# Patient Record
Sex: Male | Born: 1955
Health system: Southern US, Community
[De-identification: ages and names within clinical notes are randomized; demographics above are authoritative.]

## PROBLEM LIST (undated history)

## (undated) DIAGNOSIS — I4891 Unspecified atrial fibrillation: Secondary | ICD-10-CM

## (undated) DIAGNOSIS — I10 Essential (primary) hypertension: Secondary | ICD-10-CM

## (undated) DIAGNOSIS — I25810 Atherosclerosis of coronary artery bypass graft(s) without angina pectoris: Secondary | ICD-10-CM

## (undated) DIAGNOSIS — E669 Obesity, unspecified: Secondary | ICD-10-CM

## (undated) DIAGNOSIS — F32A Depression, unspecified: Secondary | ICD-10-CM

## (undated) DIAGNOSIS — G629 Polyneuropathy, unspecified: Secondary | ICD-10-CM

## (undated) DIAGNOSIS — I2510 Atherosclerotic heart disease of native coronary artery without angina pectoris: Secondary | ICD-10-CM

## (undated) DIAGNOSIS — F419 Anxiety disorder, unspecified: Secondary | ICD-10-CM

## (undated) DIAGNOSIS — Z9289 Personal history of other medical treatment: Secondary | ICD-10-CM

## (undated) DIAGNOSIS — I499 Cardiac arrhythmia, unspecified: Secondary | ICD-10-CM

## (undated) DIAGNOSIS — E785 Hyperlipidemia, unspecified: Secondary | ICD-10-CM

## (undated) DIAGNOSIS — I671 Cerebral aneurysm, nonruptured: Secondary | ICD-10-CM

## (undated) DIAGNOSIS — I4821 Permanent atrial fibrillation: Secondary | ICD-10-CM

## (undated) DIAGNOSIS — G5600 Carpal tunnel syndrome, unspecified upper limb: Secondary | ICD-10-CM

## (undated) DIAGNOSIS — I255 Ischemic cardiomyopathy: Secondary | ICD-10-CM

## (undated) DIAGNOSIS — T8859XA Other complications of anesthesia, initial encounter: Secondary | ICD-10-CM

## (undated) DIAGNOSIS — E119 Type 2 diabetes mellitus without complications: Secondary | ICD-10-CM

## (undated) DIAGNOSIS — M1990 Unspecified osteoarthritis, unspecified site: Secondary | ICD-10-CM

## (undated) DIAGNOSIS — F329 Major depressive disorder, single episode, unspecified: Secondary | ICD-10-CM

## (undated) DIAGNOSIS — J449 Chronic obstructive pulmonary disease, unspecified: Secondary | ICD-10-CM

## (undated) DIAGNOSIS — T4145XA Adverse effect of unspecified anesthetic, initial encounter: Secondary | ICD-10-CM

## (undated) DIAGNOSIS — Z950 Presence of cardiac pacemaker: Secondary | ICD-10-CM

## (undated) HISTORY — DX: Cerebral aneurysm, nonruptured: I67.1

## (undated) HISTORY — DX: Unspecified osteoarthritis, unspecified site: M19.90

## (undated) HISTORY — PX: SHOULDER SURGERY: SHX246

## (undated) HISTORY — DX: Personal history of other medical treatment: Z92.89

## (undated) HISTORY — DX: Carpal tunnel syndrome, unspecified upper limb: G56.00

## (undated) HISTORY — DX: Unspecified atrial fibrillation: I48.91

## (undated) HISTORY — DX: Atherosclerotic heart disease of native coronary artery without angina pectoris: I25.10

## (undated) HISTORY — DX: Hyperlipidemia, unspecified: E78.5

## (undated) HISTORY — PX: TONSILLECTOMY: SUR1361

## (undated) HISTORY — DX: Anxiety disorder, unspecified: F41.9

## (undated) HISTORY — DX: Polyneuropathy, unspecified: G62.9

## (undated) HISTORY — PX: ABDOMINAL SURGERY: SHX537

## (undated) HISTORY — PX: CARDIAC CATHETERIZATION: SHX172

## (undated) HISTORY — DX: Essential (primary) hypertension: I10

## (undated) HISTORY — DX: Obesity, unspecified: E66.9

## (undated) HISTORY — PX: CORONARY ARTERY BYPASS GRAFT: SHX141

## (undated) HISTORY — DX: Cardiac arrhythmia, unspecified: I49.9

---

## 2006-04-04 ENCOUNTER — Encounter: Admission: RE | Admit: 2006-04-04 | Discharge: 2006-04-04 | Payer: Self-pay | Admitting: Cardiology

## 2006-04-12 ENCOUNTER — Encounter (HOSPITAL_COMMUNITY): Admission: RE | Admit: 2006-04-12 | Discharge: 2006-07-11 | Payer: Self-pay | Admitting: Cardiology

## 2006-04-13 ENCOUNTER — Ambulatory Visit (HOSPITAL_COMMUNITY): Admission: RE | Admit: 2006-04-13 | Discharge: 2006-04-13 | Payer: Self-pay | Admitting: Cardiology

## 2006-04-13 ENCOUNTER — Encounter (INDEPENDENT_AMBULATORY_CARE_PROVIDER_SITE_OTHER): Payer: Self-pay | Admitting: *Deleted

## 2006-04-20 ENCOUNTER — Encounter: Payer: Self-pay | Admitting: Radiology

## 2006-04-26 ENCOUNTER — Inpatient Hospital Stay (HOSPITAL_COMMUNITY): Admission: AD | Admit: 2006-04-26 | Discharge: 2006-05-05 | Payer: Self-pay | Admitting: Cardiology

## 2006-04-27 ENCOUNTER — Encounter: Payer: Self-pay | Admitting: Vascular Surgery

## 2006-04-27 ENCOUNTER — Encounter (INDEPENDENT_AMBULATORY_CARE_PROVIDER_SITE_OTHER): Payer: Self-pay | Admitting: *Deleted

## 2006-04-29 HISTORY — PX: CORONARY ARTERY BYPASS GRAFT: SHX141

## 2006-05-21 ENCOUNTER — Encounter
Admission: RE | Admit: 2006-05-21 | Discharge: 2006-05-21 | Payer: Self-pay | Admitting: Thoracic Surgery (Cardiothoracic Vascular Surgery)

## 2010-07-31 ENCOUNTER — Ambulatory Visit (HOSPITAL_COMMUNITY): Admission: RE | Admit: 2010-07-31 | Discharge: 2010-07-31 | Payer: Self-pay | Admitting: Cardiology

## 2011-01-11 ENCOUNTER — Encounter: Payer: Self-pay | Admitting: Radiology

## 2011-01-11 ENCOUNTER — Encounter: Payer: Self-pay | Admitting: Cardiology

## 2011-05-08 NOTE — Discharge Summary (Signed)
NAMEMarland Kitchen  AAYAM, TISCARENO                ACCOUNT NO.:  [id number]   MEDICAL RECORD NO.:  [id number]          PATIENT TYPE:  INP   LOCATION:  2032                         FACILITY:  [facility]   PHYSICIAN:  Valentina Gu. Roxy Manns, M.D. DATE OF BIRTH:  January 06, 1956   DATE OF ADMISSION:  04/26/2006  DATE OF DISCHARGE:  05/05/2006                                 DISCHARGE SUMMARY   PRIMARY DIAGNOSES:  Severe three-vessel coronary artery disease.   IN-HOSPITAL DIAGNOSES:  1.  Postoperative atrial fibrillation.  2.  Volume overload.  3.  Acute blood loss anemia.   SECONDARY DIAGNOSES:  1.  Hypertension.  2.  Diabetes mellitus type 2.  3.  Tobacco abuse.  4.  Anterior communicating artery aneurysm.  5.  Chronic sinusitis.   IN-HOSPITAL OPERATIONS AND PROCEDURES:  1.  Left heart catheterization with coronary angiography and left      ventriculography.  2.  Coronary bypass grafting x4 using a left internal mammary artery to the      left anterior descending coronary artery, saphenous vein graft to      diagonal branch, saphenous vein graft to circumflex marginal branch,      saphenous vein graft to posterior descending coronary artery.      Endoscopic saphenous vein harvest from left leg left side.  3.  Intraoperative transesophageal echocardiogram.   HISTORY OF PHYSICAL AND HOSPITAL COURSE:  The patient is a 55 year old obese  white male with hypertension, diabetes mellitus type 2, hyperlipidemia, and  long-standing tobacco abuse.  The patient also has problems with chronic low  back pain with previous herniated disk of the lumbosacral spine.  The  patient presents with symptoms initially that sounded spastic for exertional  angina but recently has also been associated with atypical headache.  All of  the symptoms are exertional and relieved with rest.  He underwent stress  Cardiolite exam which was abnormal.  The patient was then taken for cardiac  catheterization by Dr. Radford Pax which demonstrated  severe three-vessel  coronary artery disease with mild left ventricular dysfunction.  Following  catheterization, CVTS was consulted.  Of note, the patient was also noted to  have history of small intracranial aneurysm for which she is seen in  consultation by Dr. Arnoldo Morale.  The patient's aneurysm per Dr. Arnoldo Morale was  felt to be relatively small and likely asymptomatic with nothing to suggest  the patient's symptoms of headache related to the aneurysm.  Dr. Arnoldo Morale  recommended continued observation of the intracranial aneurysm.  He cleared  patient for coronary artery bypass grafting.  The patient was seen and  evaluated by Dr. Roxy Manns.  Dr. Roxy Manns discussed with the patient undergoing  coronary artery bypass grafting.  He discussed risks and benefits of the  procedure with the patient.  The patient acknowledged understanding and  agreed to proceed.   The patient was taken to the operating room on May10,2007 where he underwent  coronary artery bypass grafting x4 using a left internal mammary artery to  distal left anterior descending coronary artery, saphenous vein graft to  high diagonal branch, saphenous vein graft  to circumflex marginal branch,  saphenous vein graft to posterior descending coronary artery.  Endoscopic  saphenous vein harvesting was done from the left leg.  The patient tolerated  the procedure well and transferred up to the intensive care unit in stable  condition.  Immediately following surgery, the patient was seen to be  hemodynamically stable.  He was extubated late evening/early morning  following surgery.  Following extubation, the patient seemed to be alert and  oriented x3.  During the patient's postoperative course, he did develop  postoperative atrial fibrillation postoperative day #1.  Heart rate was in  the 70s to 90s.  At that time, he was started on IV amiodarone.  The patient  continued to go in and out of atrial fibrillation.  On postoperative day #5  due to  the patient's atrial fibrillation, Dr. Roxy Manns was planning to start the  patient on Coumadin.  He had discussed with Dr. Arnoldo Morale, and Dr. Arnoldo Morale  felt that this would be safe for use in a short period of time with the  patient's intracranial aneurysm.  Later that evening, the patient did  convert back to normal sinus rhythm.  The patient's Coumadin was held at  that time.  The patient remained in normal sinus rhythm the remainder of his  hospital course.  Coumadin was not initiated.  The patient also developed  acute blood loss anemia postoperatively.  The patient was asymptomatic from  the anemia, and his last hemoglobin and hematocrit on postoperative day #5  was 11.9 and 34.5.  The patient's creatinine was also monitored during his  postoperative course which remained stable.  Last creatinine obtained  postoperative day #5 was 1.1.  The patient was also noted to be volume  overload.  He was started on diuretics at that point.  The patient was near  baseline weight prior to discharge home, weighing 128 kg where his  preoperative weight was 126 kg.   The patient's chest tubes and lines were deceased in the normal fashion.  He  was transferred up to 2000 on postoperative day #2.  He was out of bed  ambulating well.  The patient's blood sugars were monitored during his  hospital course and seemed to be elevated.  The patient was continued on  sliding scale insulin, and his metformin was increased prior to discharge  home.  Blood sugars stabilized prior to discharge home.  The patient was out  of bed ambulating well.  Vital signs were monitored and seen to be stable.  He is able to be weaned off oxygen, saturating greater than 90% on room air.  The patient's incisions were clean, dry and intact and healing well.  Postoperative chest x-ray was stable.  No signs of pneumonia or pneumothorax noted.  The patient was tolerating regular diet well.  No nausea or vomiting  noted.  Bowel movements  within normal limits.   The patient was discharged to home on postoperative day #6 on [id number].  He was in normal sinus rhythm at discharge.  A follow-up appointment was  arranged with Dr. Roxy Manns for [id number] at 2:30 p.m.  The patient was to  contact Dr. Theodosia Blender office and schedule follow-up appointment with her in 2  weeks.  Mr. Juaire received instructions on diet, activity and incisional  care.  He was told no driving until released to do so.  No heavy lifting for  at least 3 months.  The patient was told he is allowed to shower, washing  his  incisions using soap and water.  He is to contact the office if he  develops any drainage or opening from any of his incision sites.  The  patient was told to ambulate 3 to 4 times per day, progress as tolerated and  to continue his breathing exercises.  He was educated on diet to be low-fat,  low-salt as well as carbohydrate modified medium calorie diet.  The patient  acknowledges understanding.  The patient was also told to follow up with Dr.  Arnoldo Morale in the next 1-2 weeks.  He is to contact this office for an  appointment.   DISCHARGE MEDICATIONS:  1.  Nicotine patch daily.  2.  Aspirin 81 mg daily.  3.  Lopressor 50 mg b.i.d.  4.  Altace 2.5 mg daily.  5.  Zocor 20 mg every night.  6.  Lasix 40 mg daily for 7 days.  7.  Potassium chloride 20 mEq daily for 7 days.  8.  Metformin 1000 mg b.i.d.  9.  Xanax as used at home.  10. Amiodarone 400 mg t.i.d. for 1 week, then 400 mg b.i.d. for 1 week, then      400 mg daily for 1 week, then 200 mg daily.  11. Oxycodone 5 mg 1 to 2 tablets q.4-6h. p.r.n. pain.      Darlin Coco, PA      Valentina Gu. Roxy Manns, M.D.  Electronically Signed    KMD/MEDQ  D:  06/30/2006  T:  06/30/2006  Job:  [id number]   cc:   Fransico Him, M.D.  Fax: [phone]

## 2011-05-08 NOTE — Cardiovascular Report (Signed)
NAMEMarland Kitchen  Ross, [name]                ACCOUNT NO.:  [id number]   MEDICAL RECORD NO.:  [id number]          PATIENT TYPE:  OIB   LOCATION:  2003                         FACILITY:  [facility]   PHYSICIAN:  Fransico Him, M.D.     DATE OF BIRTH:  1956-07-07   DATE OF PROCEDURE:  04/26/2006  DATE OF DISCHARGE:                              CARDIAC CATHETERIZATION   REFERRING PHYSICIAN:  Orpah Melter, M.D.   PROCEDURE:  Left heart catheterization, coronary angiography, left  ventriculography.   SURGEON:  Fransico Him, M.D.   INDICATIONS:  Chest pain, abnormal Cardiolite.   COMPLICATIONS:  None.   IV ACCESS:  Via right femoral artery 6-French sheath.   This is a very pleasant 55 year old obese white male with a history of  hypertension and obesity as well as tobacco abuse who presents for further  evaluation of chest pain and abnormal Cardiolite.   The patient is brought to the cardiac catheterization laboratory in a  fasting nonsedated state.  Informed consent was obtained.  The patient was  connected to continuous heart rate and pulse oximetry monitoring and  intermittent blood pressure monitoring.  The right groin was prepped and  draped in sterile fashion.  Xylocaine 1% was used for local anesthesia.  Using modified Seldinger technique, a 6-French sheath was placed in the  right femoral artery.  Under fluoroscopic guidance a 6-French JL-4 catheter  was placed in the left coronary artery,  but the catheter could not  adequately engage the left coronary ostium very well so this catheter was  exchanged out over a guidewire for 6-French JR-5 catheter which was  successfully engaged the left coronary ostium.  Multiple cine films were  taken in the 30 degree RAO and 40 degree LAO views.  This catheter was then  exchanged out over a guidewire for 6-French JR-4 catheter which was placed  under fluoroscopic guidance in the right coronary artery.  Multiple cine  films taken in 30 degree RAO and  40 degree LAO views.  This catheter was  then exchanged out over a guidewire for 6-French angled pigtail catheter  which was placed under fluoroscopic guidance in the left ventricular cavity.  Left ventriculography was performed in 30 degree RAO view using a total of  30 mL of contrast at 15 mL per second.  Catheter was then pulled back across  the aortic valve with no significant gradient noted.  At the end of the  procedure, all catheters and sheaths were removed.  Manual compression was  performed so adequate hemostasis was obtained.  The patient was transferred  back to room in stable condition.   RESULTS:  The left main coronary artery is widely patent in its proximal and  mid portion.  In the distal portion there is a 50-60% eccentric plaque, the  vessel then bifurcates into left anterior descending artery and left  circumflex artery.   The left anterior descending artery gives rise to a high first diagonal 1  which has a 20% mid narrowing and then bifurcates distally into two daughter  branches both of which are widely patent.  The first diagonal is a very  large vessel, distal to the takeoff of the diagonal is a high grade long 90-  95% stenosis of the proximal LAD.  The LAD then gives rise to a second  diagonal branch which is smaller.  The ongoing LAD is patent to the apex.   The left circumflex traverses the AV groove and gives rise to a large obtuse  marginal branch.  There is a 90% proximal stenosis in the OM-1 was then  bifurcates into two daughter branches both of which are widely patent  distally.  There is evidence of left-to-right collaterals to the distal  right coronary artery.   The right coronary artery is patent at its proximal and mid portion and  gives rise to a large RV marginal branch and then the vessel is occluded.   Left ventriculography shows moderate LV dysfunction,  ejection fraction 30%  with mid to basal inferior akinesis and moderate global  hypokinesis.  LV  pressure 175/40 mmHg, aortic pressure 176/87 mmHg.   ASSESSMENT:  1.  Severe three-vessel coronary disease.  2.  Moderate left ventricular dysfunction,  3.  Hypertension.  4.  Diabetes mellitus.  5.  A 5-6 mm anterior communicating cerebral aneurysm diagnosed recent by      MRI/MRA.   PLAN:  CVTS consult, admit to telemetry unit.  Continue Lopressor, increase  Altace to 5 mg a day.  Continue aspirin.  Check a fasting lipid panel and  hold metformin.      Fransico Him, M.D.  Electronically Signed     TT/MEDQ  D:  04/26/2006  T:  04/27/2006  Job:  [id number]
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
## 2011-05-08 NOTE — Op Note (Signed)
NAMECHARLSTON, KLUZ                ACCOUNT NO.:  [id number]   MEDICAL RECORD NO.:  [id number]          PATIENT TYPE:  INP   LOCATION:  2301                         FACILITY:  [facility]   PHYSICIAN:  Ala Dach, M.D.DATE OF BIRTH:  10-06-1956   DATE OF PROCEDURE:  04/29/2006  DATE OF DISCHARGE:                                 OPERATIVE REPORT   PROCEDURE PERFORMED:  Intraoperative transesophageal echocardiography.   INDICATIONS FOR PROCEDURE:  Mr. Ross [name] is a 55 year old gentleman who  presents today for coronary artery bypass grafting by Valentina Gu. Roxy Manns, M.D.  He has requested that we place a transesophageal echo probe for evaluation  of cardiac structures and function.   The patient was taken to the operating room for routine induction of general  anesthesia.  The trachea was intubated.  Transesophageal echo probe was  protected, lubricated and passed oropharyngeally into the stomach, then  withdrawn for imaging of the cardiac structures.   PRECARDIOPULMONARY BYPASS TEE EXAMINATION:  Left ventricle:  This is a  concentrically hypertrophied left ventricular chamber seen in a short axis  view.  The papillary muscles are well outlined.  There is good to excellent  contractility noted in the anterior and anterolateral walls.  There is  significant decreased contractility noted in the infraseptal area and the  septal area per se.  Overall, there is good to excellent left ventricular  ejection fraction with estimated ejection fraction of greater than 40%.  Long axis review again reveals a good anterior wall but slightly diminished  contractility noted in the inferior wall on the long axis view.   Mitral valve:  Thin, compliant, normal mitral valve.   Left atrium:  Normal chamber, no masses noted.  The interatrial septum was  interrogated and is intact.   Aorta:  Thin edges, three cusps noted.  It opened well during systolic  ejection, closed appropriately on Doppler  examination.  There was no  obstruction to flow.  Nor is there any diastolic insufficiency noted.   Right ventricle:  Tricuspid valve and right atrium are essentially normal.   The patient was placed on cardiopulmonary bypass.  Coronary artery bypass  grafting was carried out.  The patient was rewarmed and separated from  cardiopulmonary bypass with the initial attempt.   POSTCARDIOPULMONARY BYPASS TEE EXAMINATION (LIMITED EXAM):  Left ventricle:  In the early bypass period, short axis reviews again revealed that  infraseptal area diminished contractility, but overall the contractility of  the heart seen in the short axis view was excellent.  At time of separation  from cardiopulmonary bypass there does appear to be some improvement in the  area of the contractility and thickening in that inferior aspect of the wall  with previously diminished contractility.  Long axis review of the left  ventricular review of the left ventricular chamber again revealed somewhat  diminished noted in that inferior wall as well.   The rest of the cardiac examination was as previously described.  Essentially normal in character.  No changes were noted.   The patient was returned to the cardiac intensive care unit  in stable  condition.           ______________________________  Ala Dach, M.D.     JTM/MEDQ  D:  04/29/2006  T:  04/30/2006  Job:  [id number]

## 2011-05-08 NOTE — Op Note (Signed)
NAME:  Ross [name], Ross [name]                ACCOUNT NO.:  [id number]   MEDICAL RECORD NO.:  [id number]          PATIENT TYPE:  INP   LOCATION:  2301                         FACILITY:  [facility]   PHYSICIAN:  Valentina Gu. Roxy Manns, M.D. DATE OF BIRTH:  01-09-1956   DATE OF PROCEDURE:  04/29/2006  DATE OF DISCHARGE:                                 OPERATIVE REPORT   PREOPERATIVE DIAGNOSIS:  Severe three-vessel coronary artery disease.   POSTOPERATIVE DIAGNOSIS:  Severe three-vessel coronary artery disease.   PROCEDURE:  Median sternotomy for coronary artery bypass grafting x4 (left  internal mammary artery to distal left anterior descending coronary artery,  saphenous vein graft to high diagonal branch, saphenous vein graft to  circumflex marginal branch, saphenous vein graft to posterior descending  coronary artery, endoscopic saphenous vein harvest from left thigh).   SURGEON:  Dr. Rexene Alberts.   ASSISTANT:  Dr. Tharon Aquas Trigt.   SECOND ASSISTANT:  Ms. Doroteo Bradford   ANESTHESIA:  General.   BRIEF CLINICAL NOTE:  The patient is a 55 year old obese white male with  hypertension, type 2 diabetes mellitus, hyperlipidemia, longstanding tobacco  abuse.  The patient also has problems with chronic low back pain with  previous herniated disk of the lumbosacral spine.  The patient presents with  symptoms initially that sound classical for exertional angina, but recently  have also been associated with atypical headaches.  All the symptoms are  exertional and relieved by rest.  He underwent a stress Cardiolite exam that  is abnormal.  He underwent cardiac catheterization by Dr. Fransico Him  demonstrating severe three-vessel coronary artery disease with mild left  ventricular dysfunction.  The patient was initially seen in consultation by  Dr. Servando Snare and coronary artery bypass grafting has been recommended.  The  patient also has an known history of a small intracranial aneurysm.  For  this  reason the patient was seen in consultation by Dr. Newman Pies.  The patient's aneurysm is felt to be relatively small and likely  asymptomatic with nothing to suggest that the patient's symptoms of  headaches are related to the aneurysm.  Continued observation of the  intracranial aneurysm has been recommended.   OPERATIVE CONSENT:  The patient has been counseled at length regarding the  indications, risks, and potential benefits of coronary artery bypass  grafting.  Alternative treatment strategies have been discussed.  He  understands and accepts all associated risks of surgery including but not  limited to risk of death, stroke, myocardial infarction, congestive heart  failure, respiratory failure, pneumonia, bleeding requiring blood  transfusion, arrhythmia, infection, and recurrent coronary artery disease.  He also understands the small risk that surgery could potentially exacerbate  problems with his intracranial aneurysm, secondary rupture or stroke.  This  seems to be relatively unlikely but not impossible.  All the patient's  questions have been addressed.   OPERATIVE FINDINGS:  1.  Normal left ventricular function.  2.  Good-quality left internal mammary artery and saphenous vein conduit.  3.  Fair quality target vessels for grafting.   OPERATIVE NOTE IN DETAIL:  The  patient was brought to the operating room on  the above-mentioned date and central monitoring was established by the  anesthesia service under the care and direction of Dr. Rica Koyanagi.  Specifically, a Swan-Ganz catheter was placed through the right internal  jugular approach.  A radial arterial line was placed.  Intravenous  antibiotics were administered.  Following induction with general  endotracheal anesthesia, a Foley catheter was placed.  The patient's chest,  abdomen, both groins, and both lower extremities were prepared, draped in  sterile manner.   Median sternotomy incision was performed and  left internal mammary artery  was dissected from the chest wall and prepared for bypass grafting.  The  left internal mammary artery is good-quality conduit.  Simultaneously  saphenous vein is obtained from the patient's left thigh and the upper  portion of the left lower leg using endoscopic vein harvest technique.  Initially a small incision is made in the right leg but the saphenous vein  on that side appears small and therefore, saphenous vein is removed from the  left thigh and leg.  The saphenous vein is notably good-quality conduit.  After the saphenous vein is removed, the small surgical incisions in both  lower extremities are closed in multiple layers with running absorbable  suture.   The patient is heparinized systemically.  The pericardium was opened.  The  ascending aorta is normal in appearance.  The ascending aorta and the right  atrium were cannulated for cardiopulmonary bypass.  Cardiopulmonary bypass  is begun and the surface of the heart was inspected.  Distal sites were  selected for coronary bypass grafting.  A temperature probe was placed in  left ventricular septum.  A cardioplegic catheter is placed in the ascending  aorta.   The patient is allowed to cool to 32 degrees systemic temperature.  The  aortic crossclamp was applied and cold blood cardioplegia is administered in  antegrade fashion through the aortic root.  Iced saline slush is applied for  topical hypothermia.  The initial cardioplegic arrest and myocardial cooling  are felt to be excellent.  Repeat doses of cardioplegia administered  intermittently throughout the crossclamp portion of the operation both  through the aortic root and down the subsequently placed vein grafts to  maintain left ventricular septal temperature below 15 degrees centigrade.   The following distal coronary anastomoses were performed:  1.  The posterior descending coronary artery is grafted with a saphenous     vein graft in  end-to-side fashion.  This vessel measures 1.3 mm in      diameter and is a fair quality target.  2.  The circumflex marginal branch is grafted with a saphenous vein graft in      end-to-side fashion.  This vessel measured 1.5 mm in diameter and is a      fair to good quality target.  3.  The high diagonal branch of the left anterior descending coronary artery      is grafted with a saphenous vein graft in end-to-side fashion.  This      vessel measures 1.7 mm in diameter and is good quality at the site of      distal bypass.  4.  The distal left anterior descending coronary artery is grafted with left      internal mammary artery in end-to-side fashion.  This vessel measures      1.3 to 1.4 mm in diameter and is fair quality at the site of distal  bypass.   All three proximal saphenous vein anastomoses were performed directly to the  ascending aorta prior to removal of the aortic cross clamp.  The left  ventricular septal temperature rises dramatically following reperfusion of  the left internal mammary artery.  The aortic crossclamp was removed after a  total crossclamp time of 79 minutes.  The heart begins to beat spontaneously  without need for cardioversion.  All proximal and distal coronary  anastomoses were inspected for hemostasis and appropriate graft orientation.  Epicardial pacing wires were fixed to the right ventricular outflow tract  and to the right atrial appendage.  The patient is rewarmed to 37 degrees  centigrade temperature.  The patient is weaned from cardiopulmonary bypass  without difficulty.  The patient's rhythm at separation from bypass is  normal sinus rhythm.  No inotropic support is required.  Total  cardiopulmonary bypass time for the operation 93 minutes.   The venous and arterial cannulae are removed uneventfully.  Protamine is  administered to reverse the anticoagulation.  The mediastinum and left chest  are irrigated with saline solution containing  vancomycin.  Meticulous  surgical hemostasis ascertained.  The mediastinum and left chest are drained  using three chest tubes exited through separate stab incisions inferiorly.  The anterior to the aorta are reapproximated loosely.   The On-Q continuous pain management system is utilized for postoperative  pain control.  Two 10-inch Silastic catheters supplied with the On-Q kit are  tunneled into the deep subcutaneous tissues located just lateral to the  lateral border of the sternum on either side.  Each of these catheters were  flushed with 5 mL of 0.5% bupivacaine solution and ultimately connected to  continuous infusion pump.   The sternum was closed with double-strength sternal wire.  The soft tissues  anterior to the sternum are closed in multiple layers and the skin is closed  with a running subcuticular skin closure.  The patient tolerated the  procedure well, and is transported the surgical intensive care unit in stable condition.  There are no intraoperative complications.  All sponge,  instrument and needle counts were verified correct at completion of the  operation.  No blood products were administered.      Valentina Gu. Roxy Manns, M.D.  Electronically Signed     CHO/MEDQ  D:  04/29/2006  T:  04/30/2006  Job:  [id number]   cc:   Fransico Him, M.D.  Fax: [phone]   Orpah Melter, M.D.  Fax: [phone]   Ophelia Charter, M.D.  Fax: [phone]

## 2011-07-06 ENCOUNTER — Other Ambulatory Visit (HOSPITAL_COMMUNITY): Payer: Self-pay | Admitting: Cardiology

## 2011-07-06 DIAGNOSIS — I4891 Unspecified atrial fibrillation: Secondary | ICD-10-CM

## 2011-07-20 ENCOUNTER — Other Ambulatory Visit (HOSPITAL_COMMUNITY): Payer: Self-pay

## 2011-07-21 ENCOUNTER — Other Ambulatory Visit (HOSPITAL_COMMUNITY): Payer: Self-pay

## 2012-07-15 ENCOUNTER — Inpatient Hospital Stay (HOSPITAL_BASED_OUTPATIENT_CLINIC_OR_DEPARTMENT_OTHER): Admit: 2012-07-15 | Payer: Self-pay | Admitting: Cardiology

## 2012-07-15 ENCOUNTER — Encounter (HOSPITAL_BASED_OUTPATIENT_CLINIC_OR_DEPARTMENT_OTHER): Payer: Self-pay

## 2012-07-15 SURGERY — JV LEFT HEART CATHETERIZATION WITH CORONARY ANGIOGRAM
Anesthesia: Moderate Sedation

## 2012-08-08 ENCOUNTER — Encounter (HOSPITAL_COMMUNITY): Payer: Self-pay | Admitting: Pharmacy Technician

## 2012-08-18 ENCOUNTER — Other Ambulatory Visit: Payer: Self-pay | Admitting: Cardiology

## 2012-08-18 ENCOUNTER — Encounter: Payer: Self-pay | Admitting: Cardiology

## 2012-08-19 ENCOUNTER — Other Ambulatory Visit: Payer: Self-pay | Admitting: Cardiology

## 2012-08-19 NOTE — H&P (Signed)
Office Visit     Patient: Kewan, Wojno Provider: Paschal Dopp. Glo Herring, NP  DOB: 12-Nov-1956   Age: 56 Y   Sex: Male Date: 08/17/2012  Phone: [phone]  Address: [phone] [place], [place]-[id number]  Pcp: MARK E HEPLER    --------------------------------------------------------------------------------  Subjective:    CC:      1. PRE CATH WORK UP/TT/LABS/SEE LINDA.      HPI:     General:           Mr Beahan is a 56 yo male with a hx of CAD with CABG, HTN, and Diabetes and PAF. 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 He was placed on Pradaxa.. He had recent nuclear stress test with + ischemia and Dr Radford Pax plans to proceed with cardiac catheterinzation. He states he did not want to tell anyone but he has been having episodes of chest pain for years.          He denies feeling any irregular heart beats, dizziness, syncope nor swelling. He has SOB with exerting which is stable. He is very anxious and worried that cath could lead to repeat CABG and he states he will not have that done. .      ROS:      as noted in HPI, he denies any allergy to seafood nor IVP dye. no GI complaints, no black or bloody BMS, no bruising, nor bleeding, no fever, chills, nor congestion, no neurological changes,.     Medical History: Type 2 diabetes, Coronary artery disease - s/p CABG, Hypertension, Carpal tunnel syndrome, Cerebral aneurysm, Obesity, Hyperlipidemia - target LDL < 70, Anxiety, Erectile dysfunction, Peripheral neuropathy, Ongoing tobacco use, Beta blocker induced bradycardia, atrial fibrillation on Pradaxa.      Family History:       Social History:      General: History of smoking  cigarettes: Current smoker, Frequency: 1 PPD.  Smoking: yes.      Medications: Multivitamins  Capsule 1 capsule Occ., Aspirin 81 MG Tablet Chewable 1 tablet Once a day, Pradaxa 150 MG 150 MG Tablet 1 capsule Twice a day, Lisinopril 20 MG Tablet TAKE ONE TABLET BY MOUTH EVERY DAY , Simvastatin 20 MG Tablet TAKE ONE TABLET BY MOUTH IN THE EVENING , Toprol XL 25 MG Tablet Extended Release 24 Hour 1/2 tablet Once a day, Viagra 50 MG Tablet 1 tablet as needed Once a day, Amlodipine Besylate 10 MG Tablet 1/2 tablet twice a day, Xanax 1 mg Tablet 1/2 tablet twice daily as needed, Medication List reviewed and reconciled with the patient     Allergies: N.K.D.A.     Objective:    Vitals: Wt 279.8, Wt change 3.8 lb, Ht 73, BMI 36.91, Pulse sitting 64, BP sitting 130/78.     Examination:     Cardiology, General:         GENERAL APPEARANCE: anxious, NAD, clothing smells of smoke.  HEENT: unremarkable.  CAROTID UPSTROKE: normal, no bruit.  JVD: flat.  HEART SOUNDS: regular, normal S1, S2, no S3 or S4.  MURMUR: absent.  LUNGS: no rales or wheezes.  ABDOMEN: soft, non tender, positive bowel sounds, .  EXTREMITIES: no leg edema.  PERIPHERAL PULSES: 2 plus bilateral.  Assessment:    Assessment:  1. Coronary atherosclerosis of native coronary artery - 414.01 (Primary)   2. Atrial fibrillation - 427.31   3. Essential hypertension, benign - 401.1   4. Chest pain - 786.50   5. Abnormal nuclear stress test - 794.39     Plan:    1. Coronary atherosclerosis of native coronary artery  Continue Aspirin Tablet Chewable, 81 MG, 1 tablet, Orally, Once a day ;  Continue Pradaxa 150 MG Tablet, 150 MG, 1 capsule, Orally, Twice a day hold Pradaxa for 2 full days prior to cath .       2. Essential hypertension, benign  Continue Lisinopril Tablet, 20 MG, TAKE ONE TABLET BY MOUTH EVERY DAY ;  Continue Toprol XL Tablet Extended Release 24 Hour, 25 MG, 1/2 tablet, Orally, Once a day ;  Continue Amlodipine Besylate Tablet, 10 MG, 1/2 tablet, Orally, twice a day .       3. Abnormal nuclear stress test         LAB: Basic Metabolic     GLUCOSE 80 123456 - mg/dL        BUN 14 6-26 - mg/dL        CREATININE 1.07 0.60-1.30 - mg/dl        eGFR (NON-AFRICAN AMERICAN) 71 >60 - calc        eGFR (AFRICAN AMERICAN) 86 >60 - calc        SODIUM 141 136-145 - mmol/L        POTASSIUM 4.7 3.5-5.5 - mmol/L        CHLORIDE 106 98-107 - mmol/L        C02 29 22-32 - mg/dL        ANION GAP 10.9 6.0-20.0 - mmol/L         CALCIUM 10.6 8.6-10.3 - mg/dL H              FERGUSON,CYNTHIA A 08/17/2012 04:13:10 PM > stable for cath        LAB: CBC with Diff     WBC 9.5 4.0-11.0 - K/ul        RBC 5.08 4.20-5.80 - M/uL        HGB 15.8 13.0-17.0 - g/dL        HCT 47.9 39.0-52.0 - %        MCH 31.1 27.0-33.0 - pg        MPV 10.4 7.5-10.7 - fL         MCV 94.4 80.0-94.0 - fL H       MCHC 32.9 32.0-36.0 - g/dL        RDW 12.8 11.5-15.5 - %        NRBC# 0.01 -        PLT 243 150-400 - K/uL        NEUT % 46.4 43.3-71.9 - %        NRBC% 0.10 - %        LYMPH% 39.3 16.8-43.5 - %        MONO % 8.4 4.6-12.4 - %        EOS % 5.3 0.0-7.8 - %        BASO % 0.6 0.0-1.0 - %        NEUT # 4.4 1.9-7.2 - K/uL         LYMPH# 3.70 1.10-2.70 - K/uL H       MONO # 0.8 0.3-0.8 - K/uL        EOS # 0.5 0.0-0.6 -  K/uL        BASO # 0.1 0.0-0.1 - K/uL               FERGUSON,CYNTHIA A 08/17/2012 04:13:32 PM > ok for cath        LAB: PT and PTT [id number])     aPTT 32 24-33 - SEC        INR 1.1 0.8-1.2 -        Prothrombin Time 11.2 9.1-12.0 - SEC               FERGUSON,CYNTHIA A 08/18/2012 10:04:30 AM > ok for cath   Risks and benefits of cardiac catheterization have been reviewed including risk of stroke, heart attack, death, bleeding, renal impariment and arterial damage. There was ample oppurtuny to answer questions. Alternatives were discussed. Patient understands and wishes to proceed.          Immunizations:       Labs:      Procedure Codes: [id number] ECL BMP, [id number] ECL CBC PLATELET DIFF, [id number] BLOOD COLLECTION ROUTINE VENIPUNCTURE      Preventive:           Follow Up: TT pending cath (Reason: CAD, cardiac cath f/u)        Provider: Paschal Dopp. Glo Herring, NP  Patient: Kycen, Rabara  DOB: [birth year]  Date: 08/17/2012

## 2012-08-23 ENCOUNTER — Encounter (HOSPITAL_COMMUNITY)
Admission: RE | Disposition: A | Discharge status: Home / Self Care | Payer: Self-pay | Source: Ambulatory Visit | Attending: Cardiology

## 2012-08-23 ENCOUNTER — Ambulatory Visit (HOSPITAL_COMMUNITY)
Admission: RE | Admit: 2012-08-23 | Discharge: 2012-08-23 | Disposition: A | Discharge status: Home / Self Care | Payer: Medicaid Other | Source: Ambulatory Visit | Attending: Cardiology | Admitting: Cardiology

## 2012-08-23 DIAGNOSIS — I2510 Atherosclerotic heart disease of native coronary artery without angina pectoris: Secondary | ICD-10-CM | POA: Insufficient documentation

## 2012-08-23 DIAGNOSIS — E119 Type 2 diabetes mellitus without complications: Secondary | ICD-10-CM | POA: Insufficient documentation

## 2012-08-23 DIAGNOSIS — R079 Chest pain, unspecified: Secondary | ICD-10-CM | POA: Insufficient documentation

## 2012-08-23 DIAGNOSIS — I10 Essential (primary) hypertension: Secondary | ICD-10-CM | POA: Insufficient documentation

## 2012-08-23 DIAGNOSIS — E669 Obesity, unspecified: Secondary | ICD-10-CM | POA: Insufficient documentation

## 2012-08-23 DIAGNOSIS — F17200 Nicotine dependence, unspecified, uncomplicated: Secondary | ICD-10-CM | POA: Insufficient documentation

## 2012-08-23 DIAGNOSIS — I25810 Atherosclerosis of coronary artery bypass graft(s) without angina pectoris: Secondary | ICD-10-CM | POA: Insufficient documentation

## 2012-08-23 DIAGNOSIS — R0602 Shortness of breath: Secondary | ICD-10-CM | POA: Insufficient documentation

## 2012-08-23 HISTORY — PX: LEFT HEART CATHETERIZATION WITH CORONARY ANGIOGRAM: SHX5451

## 2012-08-23 LAB — GLUCOSE, CAPILLARY: Glucose-Capillary: 105 mg/dL — ABNORMAL HIGH (ref 70–99)

## 2012-08-23 SURGERY — LEFT HEART CATHETERIZATION WITH CORONARY ANGIOGRAM
Anesthesia: LOCAL

## 2012-08-23 MED ORDER — ASPIRIN 81 MG PO CHEW
324.0000 mg | CHEWABLE_TABLET | ORAL | Status: AC
  Administered 2012-08-23: 324 mg via ORAL

## 2012-08-23 MED ORDER — ONDANSETRON HCL 4 MG/2ML IJ SOLN: 4.0000 mg | Freq: Four times a day (QID) | INTRAMUSCULAR | Status: DC | PRN

## 2012-08-23 MED ORDER — HEPARIN (PORCINE) IN NACL 2-0.9 UNIT/ML-% IJ SOLN
INTRAMUSCULAR | Status: AC
  Filled 2012-08-23: qty 2000

## 2012-08-23 MED ORDER — NITROGLYCERIN 0.2 MG/ML ON CALL CATH LAB
INTRAVENOUS | Status: AC
  Filled 2012-08-23: qty 1

## 2012-08-23 MED ORDER — SODIUM CHLORIDE 0.9 % IJ SOLN: 3.0000 mL | Freq: Two times a day (BID) | INTRAMUSCULAR | Status: DC

## 2012-08-23 MED ORDER — DIAZEPAM 5 MG PO TABS
ORAL_TABLET | ORAL | Status: AC
  Administered 2012-08-23: 5 mg via ORAL
  Filled 2012-08-23: qty 1

## 2012-08-23 MED ORDER — ASPIRIN 81 MG PO CHEW
CHEWABLE_TABLET | ORAL | Status: AC
  Administered 2012-08-23: 324 mg via ORAL
  Filled 2012-08-23: qty 4

## 2012-08-23 MED ORDER — DIAZEPAM 5 MG PO TABS
5.0000 mg | ORAL_TABLET | ORAL | Status: AC
  Administered 2012-08-23: 5 mg via ORAL

## 2012-08-23 MED ORDER — FENTANYL CITRATE 0.05 MG/ML IJ SOLN
INTRAMUSCULAR | Status: AC
  Filled 2012-08-23: qty 2

## 2012-08-23 MED ORDER — MIDAZOLAM HCL 2 MG/2ML IJ SOLN
INTRAMUSCULAR | Status: AC
  Filled 2012-08-23: qty 2

## 2012-08-23 MED ORDER — SODIUM CHLORIDE 0.9 % IV SOLN
INTRAVENOUS | Status: DC
  Administered 2012-08-23: 1000 mL via INTRAVENOUS

## 2012-08-23 MED ORDER — ISOSORBIDE MONONITRATE ER 30 MG PO TB24: 30.0000 mg | ORAL_TABLET | Freq: Every day | ORAL | Status: DC

## 2012-08-23 MED ORDER — SODIUM CHLORIDE 0.9 % IV SOLN: 1.0000 mL/kg/h | INTRAVENOUS | Status: DC

## 2012-08-23 MED ORDER — LIDOCAINE HCL (PF) 1 % IJ SOLN
INTRAMUSCULAR | Status: AC
  Filled 2012-08-23: qty 30

## 2012-08-23 MED ORDER — SODIUM CHLORIDE 0.9 % IV SOLN: 250.0000 mL | INTRAVENOUS | Status: DC | PRN

## 2012-08-23 MED ORDER — ACETAMINOPHEN 325 MG PO TABS: 650.0000 mg | ORAL_TABLET | ORAL | Status: DC | PRN

## 2012-08-23 MED ORDER — SODIUM CHLORIDE 0.9 % IJ SOLN: 3.0000 mL | INTRAMUSCULAR | Status: DC | PRN

## 2012-08-23 NOTE — Interval H&P Note (Signed)
History and Physical Interval Note:  08/23/2012 9:25 AM  Ross [name]  has presented today for surgery, with the diagnosis of Chest pain  The various methods of treatment have been discussed with the patient and family. After consideration of risks, benefits and other options for treatment, the patient has consented to  Procedure(s) (LRB): LEFT HEART CATHETERIZATION WITH CORONARY ANGIOGRAM (N/A) as a surgical intervention .  The patient's history has been reviewed, patient examined, no change in status, stable for surgery.  I have reviewed the patient's chart and labs.  Questions were answered to the patient's satisfaction.     TURNER,TRACI R

## 2012-08-23 NOTE — CV Procedure (Signed)
PROCEDURE:  Left heart catheterization with selective coronary angiography, left ventriculogram.  INDICATIONS:    The risks, benefits, and details of the procedure were explained to the patient.  The patient verbalized understanding and wanted to proceed.  Informed written consent was obtained.  PROCEDURE TECHNIQUE:  After Xylocaine anesthesia a 106F sheath was placed in the right femoral artery with a single anterior needle wall stick.   Left coronary angiography was done using a Judkins L4 guide catheter.  Right coronary angiography was done using a Judkins R4 guide catheter.  Left ventriculography was done using a pigtail catheter.    CONTRAST:  Total of 150cc.  COMPLICATIONS:  None.    HEMODYNAMICS:  Aortic pressure was 124/61mmHg; LV pressure was 120/45mmHg; LVEDP 86mmHg.  There was no gradient between the left ventricle and aorta.    ANGIOGRAPHIC DATA:   The left main coronary artery is widely patent in its proximal portion and distally has a 60% eccentric plaque and then bifurcates intto an LAD and left circumflex..  The left anterior descending artery is widely patent in its proximal portion.  It gives rise to a first diagonal.  The ongoing LAD has a high grade 90% stenosis in the mid LAD with evidence of competitive flow in the mid LAD into the LIMA graft.    The left circumflex artery has a 40% stenosis the proximal portion.  The OM1 appears occluded proximally.  The right coronary artery is patent proximally and at the takeoff of a large RV marginal branch there is a long 80-90% stenosis.  The ongoing RCA is patent and distally has a 60-70% stenosis before bifurcating into a PDA and PL branches.  The PDA is diffusely diseased and small  The SVG to Diagonal is widely patent.  The SVG to OM1 is widely patent.  The LIMA to LAD is widely patent.  The SVG to PDA is occluded at the ostium.  LEFT VENTRICULOGRAM:  Left ventricular angiogram was done in the 30 RAO projection and  revealed normal left ventricular wall motion and systolic function with an estimated ejection fraction of 55  LVEDP was 40mHg.  IMPRESSIONS:  1. Severe 3 vessel ASCAD 2.  Patent SVG to OM1, SVG to diagonal and LIMA to LAD 3.  Occluded SVG to PDA with high grade stenosis of mid RCA 4.  Normal left ventricular systolic function.  LVEDP 67mHg.  Ejection fraction 55  RECOMMENDATION:   1.  D/C home after IVF and bedrest complete. 2.  Films reviewed with Dr. Tamala Julian.  Patient has very atypical CP only at rest and is better with exertion.  His lesion in the RCA is long and would require several stents.  I have recommended medical management at this time with nitrates.  I have counseled the patient in smoking cessation. 3.  Followup with Bennye Alm NP in 2 weeks

## 2012-08-23 NOTE — H&P (View-Only) (Signed)
Office Visit     Patient: Ross [name], Ross [name] Provider: Paschal Dopp. Glo Herring, NP  DOB: 1956/07/09   Age: 56 Y   Sex: Male Date: 08/17/2012  Phone: [phone]  Address: [phone], [place], [place]  Pcp: MARK E HEPLER    --------------------------------------------------------------------------------  Subjective:    CC:      1. PRE CATH WORK UP/TT/LABS/SEE LINDA.      HPI:     General:           Ross [name] is a 55 yo male with a hx of CAD with CABG, HTN, and Diabetes and PAF. 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 He was placed on Pradaxa.. He had recent nuclear stress test with + ischemia and Dr Radford Pax plans to proceed with cardiac catheterinzation. He states he did not want to tell anyone but he has been having episodes of chest pain for years.          He denies feeling any irregular heart beats, dizziness, syncope nor swelling. He has SOB with exerting which is stable. He is very anxious and worried that cath could lead to repeat CABG and he states he will not have that done. .      ROS:      as noted in HPI, he denies any allergy to seafood nor IVP dye. no GI complaints, no black or bloody BMS, no bruising, nor bleeding, no fever, chills, nor congestion, no neurological changes,.     Medical History: Type 2 diabetes, Coronary artery disease - s/p CABG, Hypertension, Carpal tunnel syndrome, Cerebral aneurysm, Obesity, Hyperlipidemia - target LDL < 70, Anxiety, Erectile dysfunction, Peripheral neuropathy, Ongoing tobacco use, Beta blocker induced bradycardia, atrial fibrillation on Pradaxa.      Family History:       Social History:      General: History of smoking  cigarettes: Current smoker, Frequency: 1 PPD.  Smoking: yes.      Medications: Multivitamins  Capsule 1 capsule Occ., Aspirin 81 MG Tablet Chewable 1 tablet Once a day, Pradaxa 150 MG 150 MG Tablet 1 capsule Twice a day, Lisinopril 20 MG Tablet TAKE ONE TABLET BY MOUTH EVERY DAY , Simvastatin 20 MG Tablet TAKE ONE TABLET BY MOUTH IN THE EVENING , Toprol XL 25 MG Tablet Extended Release 24 Hour 1/2 tablet Once a day, Viagra 50 MG Tablet 1 tablet as needed Once a day, Amlodipine Besylate 10 MG Tablet 1/2 tablet twice a day, Xanax 1 mg Tablet 1/2 tablet twice daily as needed, Medication List reviewed and reconciled with the patient     Allergies: N.K.D.A.     Objective:    Vitals: Wt 279.8, Wt change 3.8 lb, Ht 73, BMI 36.91, Pulse sitting 64, BP sitting 130/78.     Examination:     Cardiology, General:         GENERAL APPEARANCE: anxious, NAD, clothing smells of smoke.  HEENT: unremarkable.  CAROTID UPSTROKE: normal, no bruit.  JVD: flat.  HEART SOUNDS: regular, normal S1, S2, no S3 or S4.  MURMUR: absent.  LUNGS: no rales or wheezes.  ABDOMEN: soft, non tender, positive bowel sounds, .  EXTREMITIES: no leg edema.  PERIPHERAL PULSES: 2 plus bilateral.  Assessment:    Assessment:  1. Coronary atherosclerosis of native coronary artery - 414.01 (Primary)   2. Atrial fibrillation - 427.31   3. Essential hypertension, benign - 401.1   4. Chest pain - 786.50   5. Abnormal nuclear stress test - 794.39     Plan:    1. Coronary atherosclerosis of native coronary artery  Continue Aspirin Tablet Chewable, 81 MG, 1 tablet, Orally, Once a day ;  Continue Pradaxa 150 MG Tablet, 150 MG, 1 capsule, Orally, Twice a day hold Pradaxa for 2 full days prior to cath .       2. Essential hypertension, benign  Continue Lisinopril Tablet, 20 MG, TAKE ONE TABLET BY MOUTH EVERY DAY ;  Continue Toprol XL Tablet Extended Release 24 Hour, 25 MG, 1/2 tablet, Orally, Once a day ;  Continue Amlodipine Besylate Tablet, 10 MG, 1/2 tablet, Orally, twice a day .       3. Abnormal nuclear stress test         LAB: Basic Metabolic     GLUCOSE 80 123456 - mg/dL        BUN 14 6-26 - mg/dL        CREATININE 1.07 0.60-1.30 - mg/dl        eGFR (NON-AFRICAN AMERICAN) 71 >60 - calc        eGFR (AFRICAN AMERICAN) 86 >60 - calc        SODIUM 141 136-145 - mmol/L        POTASSIUM 4.7 3.5-5.5 - mmol/L        CHLORIDE 106 98-107 - mmol/L        C02 29 22-32 - mg/dL        ANION GAP 10.9 6.0-20.0 - mmol/L         CALCIUM 10.6 8.6-10.3 - mg/dL H              FERGUSON,CYNTHIA A 08/17/2012 04:13:10 PM > stable for cath        LAB: CBC with Diff     WBC 9.5 4.0-11.0 - K/ul        RBC 5.08 4.20-5.80 - M/uL        HGB 15.8 13.0-17.0 - g/dL        HCT 47.9 39.0-52.0 - %        MCH 31.1 27.0-33.0 - pg        MPV 10.4 7.5-10.7 - fL         MCV 94.4 80.0-94.0 - fL H       MCHC 32.9 32.0-36.0 - g/dL        RDW 12.8 11.5-15.5 - %        NRBC# 0.01 -        PLT 243 150-400 - K/uL        NEUT % 46.4 43.3-71.9 - %        NRBC% 0.10 - %        LYMPH% 39.3 16.8-43.5 - %        MONO % 8.4 4.6-12.4 - %        EOS % 5.3 0.0-7.8 - %        BASO % 0.6 0.0-1.0 - %        NEUT # 4.4 1.9-7.2 - K/uL         LYMPH# 3.70 1.10-2.70 - K/uL H       MONO # 0.8 0.3-0.8 - K/uL        EOS # 0.5 0.0-0.6 -  K/uL        BASO # 0.1 0.0-0.1 - K/uL               FERGUSON,CYNTHIA A 08/17/2012 04:13:32 PM > ok for cath        LAB: PT and PTT [id number])     aPTT 32 24-33 - SEC        INR 1.1 0.8-1.2 -        Prothrombin Time 11.2 9.1-12.0 - SEC               FERGUSON,CYNTHIA A 08/18/2012 10:04:30 AM > ok for cath   Risks and benefits of cardiac catheterization have been reviewed including risk of stroke, heart attack, death, bleeding, renal impariment and arterial damage. There was ample oppurtuny to answer questions. Alternatives were discussed. Patient understands and wishes to proceed.          Immunizations:       Labs:      Procedure Codes: [id number] ECL BMP, [id number] ECL CBC PLATELET DIFF, [id number] BLOOD COLLECTION ROUTINE VENIPUNCTURE      Preventive:           Follow Up: TT pending cath (Reason: CAD, cardiac cath f/u)        Provider: Paschal Dopp. Glo Herring, NP  Patient: Ross [name], Ross [name]  DOB: 1956-08-10  Date: 08/17/2012

## 2013-09-16 ENCOUNTER — Other Ambulatory Visit: Payer: Self-pay | Admitting: Cardiology

## 2013-09-16 DIAGNOSIS — I4891 Unspecified atrial fibrillation: Secondary | ICD-10-CM

## 2013-09-16 DIAGNOSIS — Z79899 Other long term (current) drug therapy: Secondary | ICD-10-CM

## 2013-10-04 ENCOUNTER — Telehealth: Payer: Self-pay | Admitting: General Surgery

## 2013-10-04 NOTE — Telephone Encounter (Signed)
Pt walked in and handed a form for [place] to sign for him to receive his pradaxa medication. I put on Dr. Landis Gandy desk for her to fill out.

## 2013-10-04 NOTE — Telephone Encounter (Signed)
Pt is aware paper has been faxed for him. Last rx sent in June 2014.

## 2013-10-23 ENCOUNTER — Other Ambulatory Visit: Payer: Medicaid Other

## 2013-10-24 ENCOUNTER — Other Ambulatory Visit (INDEPENDENT_AMBULATORY_CARE_PROVIDER_SITE_OTHER): Payer: BC Managed Care – PPO

## 2013-10-24 DIAGNOSIS — I4891 Unspecified atrial fibrillation: Secondary | ICD-10-CM

## 2013-10-24 DIAGNOSIS — Z79899 Other long term (current) drug therapy: Secondary | ICD-10-CM

## 2013-10-24 LAB — CBC
MCHC: 34.4 g/dL (ref 30.0–36.0)
Platelets: 247 10*3/uL (ref 150.0–400.0)
RDW: 13.1 % (ref 11.5–14.6)

## 2013-10-29 NOTE — Progress Notes (Signed)
Creatinine normal.

## 2013-11-14 ENCOUNTER — Telehealth: Payer: Self-pay | Admitting: Cardiology

## 2013-11-14 NOTE — Telephone Encounter (Signed)
New Problem:  Pt is asking to hear the results of his blood work from a month ago.

## 2013-11-14 NOTE — Telephone Encounter (Signed)
Made pt aware. We had called Nov 4th and gave results to his wife who is on he DPR from [place].

## 2013-12-01 ENCOUNTER — Encounter: Payer: Self-pay | Admitting: Physician Assistant

## 2013-12-01 ENCOUNTER — Ambulatory Visit (INDEPENDENT_AMBULATORY_CARE_PROVIDER_SITE_OTHER): Payer: BC Managed Care – PPO | Admitting: Physician Assistant

## 2013-12-01 VITALS — BP 119/68 | HR 67 | Ht 73.0 in | Wt 279.0 lb

## 2013-12-01 DIAGNOSIS — I4891 Unspecified atrial fibrillation: Secondary | ICD-10-CM

## 2013-12-01 DIAGNOSIS — E785 Hyperlipidemia, unspecified: Secondary | ICD-10-CM

## 2013-12-01 DIAGNOSIS — I2510 Atherosclerotic heart disease of native coronary artery without angina pectoris: Secondary | ICD-10-CM

## 2013-12-01 DIAGNOSIS — I10 Essential (primary) hypertension: Secondary | ICD-10-CM

## 2013-12-01 DIAGNOSIS — I25709 Atherosclerosis of coronary artery bypass graft(s), unspecified, with unspecified angina pectoris: Secondary | ICD-10-CM | POA: Insufficient documentation

## 2013-12-01 DIAGNOSIS — R079 Chest pain, unspecified: Secondary | ICD-10-CM

## 2013-12-01 DIAGNOSIS — G8929 Other chronic pain: Secondary | ICD-10-CM

## 2013-12-01 NOTE — Progress Notes (Signed)
[place], [place], [place]  [address] Phone: [phone] Fax:  [phone]  Date:  12/01/2013   ID:  ALEGANDRO VANKAMPEN, DOB [birth year], MRN [id number]  PCP:  Orpah Melter, MD  Cardiologist:  Dr. Fransico Him     History of Present Illness: Ross [name] is a 57 y.o. male with a hx of CAD, status post CABG, T2DM, permanent atrial fibrillation, HTN, HL, prior cerebral aneurysm. He developed atrial fibrillation during an exercise treadmill test in 2012. He was placed on Pradaxa at that time. Myoview (06/2012): Apical anterior mild ischemia, inferior attenuation. LHC (08/2012): dLM 60%, mid LAD 90%, pCFX 40%, OM1 occluded, RCA 80-90%, distal RCA 60-70%, SVG-Dx patent, SVG-OM1 patent, LIMA-LAD patent, SVG-PDA occluded, EF 55%. The disease in his RCA was long and was felt to require several stents. Medical therapy was recommended at that time.  Patient did not want redo CABG. Echocardiogram (05/2013): Mild LVH, EF 54.8%, moderate LAE, mild aortic root dilatation, trace pulmonic regurgitation. Last seen by Dr. Radford Pax 06/15/13. Notes indicate the patient had a Holter monitor recently that demonstrated atrial fibrillation with slow ventricular response. He did have bradycardia with heart rates in the 20s during sleep. Rate control strategy was recommended for his atrial fibrillation.  Since last seen, he is doing ok.  He has chronic chest pain.  This is greatly improved since increasing Isosorbide to 30 mg BID.  He has chronic DOE.  He is NYHA Class 2-2b.  He denies syncope.  No orthopnea, PND, edema.  He has significant DJD in his knees.  He may need TKR in the next couple of years.    Recent Labs: 10/24/2013: Creatinine 1.3; Hemoglobin 16.1   Wt Readings from Last 3 Encounters:  12/01/13 279 lb (126.554 kg)  08/23/12 280 lb (127.007 kg)  08/23/12 280 lb (127.007 kg)     Past Medical History  Diagnosis Date  . Diabetes mellitus   . Hypertension   . Carpal tunnel syndrome   .  Cerebral aneurysm   . Obesity   . Dyslipidemia   . Anxiety   . Peripheral neuropathy   . Coronary artery disease     a. s/p CABG;  b. Myoview (7/13):  apical anterior ischemia; c. LHC (08/2012): dLM 60%, mid LAD 90%, pCFX 40%, OM1 occluded, RCA 80-90%, distal RCA 60-70%, SVG-Dx patent, SVG-OM1 patent, LIMA-LAD patent, SVG-PDA occluded, EF 55%. => RCA dsz too long and would req multiple stents; pt refused CABG => med Rx.  Marland Kitchen Hx of echocardiogram     a. Echocardiogram (05/2013): Mild LVH, EF 54.8%, moderate LAE, mild aortic root dilatation, trace pulmonic regurgitation  . DJD (degenerative joint disease)   . Atrial fibrillation     rate control strategy; LA large    Current Outpatient Prescriptions  Medication Sig Dispense Refill  . ALPRAZolam (XANAX) 1 MG tablet Take 0.5-1 mg by mouth 2 (two) times daily as needed. For anxiety      . amLODipine (NORVASC) 10 MG tablet Take 5 mg by mouth every 12 (twelve) hours. Patient has to take 12 hours apart.      . dabigatran (PRADAXA) 150 MG CAPS Take 150 mg by mouth every 12 (twelve) hours.      . isosorbide mononitrate (IMDUR) 30 MG 24 hr tablet Take 1 tablet (30 mg total) by mouth daily.  30 tablet  11  . lisinopril (PRINIVIL,ZESTRIL) 20 MG tablet Take 20 mg by mouth daily.      . simvastatin (  ZOCOR) 20 MG tablet Take 20 mg by mouth every evening.       No current facility-administered medications for this visit.    Allergies:   Review of patient's allergies indicates no known allergies.   Social History:  The patient  reports that he has quit smoking. He does not have any smokeless tobacco history on file.   Family History:  The patient's family history is not on file.   ROS:  Please see the history of present illness.      All other systems reviewed and negative.   PHYSICAL EXAM: VS:  BP 119/68  Pulse 67  Ht 6\' 1"  (1.854 m)  Wt 279 lb (126.554 kg)  BMI 36.82 kg/m2 Well nourished, well developed, in no acute distress HEENT: normal Neck:  no JVD Cardiac:  normal S1, S2; irregularly irregular rhythm; no murmur Lungs:  clear to auscultation bilaterally, no wheezing, rhonchi or rales Abd: soft, nontender, no hepatomegaly Ext: no edema Skin: warm and dry Neuro:  CNs 2-12 intact, no focal abnormalities noted  EKG:  AFib, HR 67, no ST changes      ASSESSMENT AND PLAN:  1. CAD:  Stable chronic chest pain.  Continue current Rx including nitrates, amlodipine, statin.  HR too slow to take beta blocker.  He is not on ASA as he is on Pradaxa. 2. Atrial Fibrillation:  Rate controlled.  Continue Pradaxa. 3. Hypertension:  Controlled. 4. Hyperlipidemia:  Continue statin. 5. Disposition:  F/u with Dr. Fransico Him in 6 mos.   Signed, Richardson Dopp, PA-C  12/01/2013 11:32 AM

## 2013-12-01 NOTE — Patient Instructions (Signed)
NO CHANGES WERE MADE TODAY  Your physician wants you to follow-up in: [place]. You will receive a reminder letter in the mail two months in advance. If you don't receive a letter, please call our office to schedule the follow-up appointment.

## 2014-01-04 ENCOUNTER — Ambulatory Visit: Payer: Medicaid Other | Admitting: Cardiology

## 2014-01-12 ENCOUNTER — Other Ambulatory Visit: Payer: Self-pay

## 2014-01-12 MED ORDER — ISOSORBIDE MONONITRATE ER 30 MG PO TB24: 30.0000 mg | ORAL_TABLET | Freq: Two times a day (BID) | ORAL | Status: DC

## 2014-06-11 ENCOUNTER — Other Ambulatory Visit: Payer: Self-pay | Admitting: General Surgery

## 2014-06-11 ENCOUNTER — Telehealth: Payer: Self-pay | Admitting: *Deleted

## 2014-06-11 DIAGNOSIS — I482 Chronic atrial fibrillation, unspecified: Secondary | ICD-10-CM

## 2014-06-11 MED ORDER — DABIGATRAN ETEXILATE MESYLATE 150 MG PO CAPS: 150.0000 mg | ORAL_CAPSULE | Freq: Two times a day (BID) | ORAL | Status: DC

## 2014-06-11 NOTE — Telephone Encounter (Signed)
I tried Risk manager. I could not on the computer in nurses station. I will print out in the am and will have Dr Radford Pax sign off on it

## 2014-06-11 NOTE — Telephone Encounter (Signed)
Rx printed and on Dr Morene Rankins to sign.

## 2014-06-11 NOTE — Telephone Encounter (Signed)
Patient is out of pradaxa and needs an rx printed and faxed to [phone]. He needs an appointment but doesn't know why, I made him aware that per Richardson Dopp he was to return for follow up with Dr Radford Pax in six months. He wants the rx to be for a year, but I told him that I was unsure that it would be for a year at this time as he is due to follow up. He doesn't know what info needs to be on the cover sheet for this. He also requests a call back when this is handled and also so he can find out how long the rx will be for. I transferred him to scheduling to set up an appointment. Thanks, MI

## 2014-06-12 NOTE — Telephone Encounter (Signed)
Pt aware Pradaxa has been faxed for him

## 2014-06-27 ENCOUNTER — Telehealth: Payer: Self-pay | Admitting: *Deleted

## 2014-06-27 ENCOUNTER — Other Ambulatory Visit: Payer: Self-pay | Admitting: General Surgery

## 2014-06-27 MED ORDER — DABIGATRAN ETEXILATE MESYLATE 150 MG PO CAPS: 150.0000 mg | ORAL_CAPSULE | Freq: Two times a day (BID) | ORAL | Status: DC

## 2014-06-27 NOTE — Telephone Encounter (Signed)
Reprinted RX and put needed infor on cover sheet. Waiting for Dr Radford Pax to sign rx and then will fax for pt.

## 2014-06-27 NOTE — Telephone Encounter (Signed)
Patient called and stated that Ross [name] has not received the rx for pradaxa. Patient stated that if the rx and cover sheet does not have his name, dob, and address they will not even acknowledge the fax. Also he would like for you to put on there that it is ok for them to ship this to his home. Thanks, MI

## 2014-08-09 ENCOUNTER — Ambulatory Visit: Payer: BC Managed Care – PPO | Admitting: Cardiology

## 2014-09-06 ENCOUNTER — Ambulatory Visit: Payer: BC Managed Care – PPO | Admitting: Cardiology

## 2014-09-07 ENCOUNTER — Encounter: Payer: Self-pay | Admitting: Cardiology

## 2014-09-07 ENCOUNTER — Ambulatory Visit (INDEPENDENT_AMBULATORY_CARE_PROVIDER_SITE_OTHER): Payer: BC Managed Care – PPO | Admitting: Cardiology

## 2014-09-07 VITALS — BP 124/86 | HR 85 | Ht 73.5 in | Wt 269.8 lb

## 2014-09-07 DIAGNOSIS — I482 Chronic atrial fibrillation, unspecified: Secondary | ICD-10-CM

## 2014-09-07 DIAGNOSIS — I77810 Thoracic aortic ectasia: Secondary | ICD-10-CM | POA: Insufficient documentation

## 2014-09-07 DIAGNOSIS — E785 Hyperlipidemia, unspecified: Secondary | ICD-10-CM

## 2014-09-07 DIAGNOSIS — I10 Essential (primary) hypertension: Secondary | ICD-10-CM

## 2014-09-07 DIAGNOSIS — I2510 Atherosclerotic heart disease of native coronary artery without angina pectoris: Secondary | ICD-10-CM

## 2014-09-07 DIAGNOSIS — I4891 Unspecified atrial fibrillation: Secondary | ICD-10-CM

## 2014-09-07 NOTE — Progress Notes (Addendum)
[address], [place], [place]  [address] Phone: [phone] Fax:  [phone]  Date:  09/07/2014   ID:  Ross [name], DOB 06-May-1956, MRN [id number]  PCP:  Orpah Melter, MD  Cardiologist:  Fransico Him, MD    History of Present Illness: Ross [name] is a 59 y.o. male with a history of ASCAD s/p CABG with last cath 9/13 showing occluded SVG to PDA and high grade stenosis of the mid RCA deemed not suitable for PCI due to length of lesion now on medical management, HTN, type II DM,obesity, mildly dilated aortic root and chronic AF.  He is on permanent disability.  He is doing well today.  He denies any LE edema, dizziness, palpitations or syncope.  He has chronic stable angina that is controlled with Imdur.  He has chronic DOE which has improved with him becoming more active. He occasionally has some chest pain about 30 minutes after taking Imdur if he takes it on an empty stomach.  He is more active than he was the last time I saw him.     Wt Readings from Last 3 Encounters:  09/07/14 269 lb 12.8 oz (122.38 kg)  12/01/13 279 lb (126.554 kg)  08/23/12 280 lb (127.007 kg)     Past Medical History  Diagnosis Date  . Diabetes mellitus   . Hypertension   . Carpal tunnel syndrome   . Cerebral aneurysm   . Obesity   . Dyslipidemia   . Anxiety   . Peripheral neuropathy   . Coronary artery disease     a. s/p CABG;  b. Myoview (7/13):  apical anterior ischemia; c. LHC (08/2012): dLM 60%, mid LAD 90%, pCFX 40%, OM1 occluded, RCA 80-90%, distal RCA 60-70%, SVG-Dx patent, SVG-OM1 patent, LIMA-LAD patent, SVG-PDA occluded, EF 55%. => RCA dsz too long and would req multiple stents; pt refused CABG => med Rx.  Marland Kitchen Hx of echocardiogram     a. Echocardiogram (05/2013): Mild LVH, EF 54.8%, moderate LAE, mild aortic root dilatation, trace pulmonic regurgitation  . DJD (degenerative joint disease)   . Atrial fibrillation     rate control strategy; LA large    Current Outpatient  Prescriptions  Medication Sig Dispense Refill  . ALPRAZolam (XANAX) 1 MG tablet Take 0.5-1 mg by mouth 2 (two) times daily as needed. For anxiety      . amLODipine (NORVASC) 10 MG tablet Take 5 mg by mouth every 12 (twelve) hours. Patient has to take 12 hours apart.      . dabigatran (PRADAXA) 150 MG CAPS capsule Take 1 capsule (150 mg total) by mouth every 12 (twelve) hours.  180 capsule  3  . isosorbide mononitrate (IMDUR) 30 MG 24 hr tablet Take 1 tablet (30 mg total) by mouth 2 (two) times daily.  60 tablet  10  . lisinopril (PRINIVIL,ZESTRIL) 20 MG tablet Take 20 mg by mouth daily.      . simvastatin (ZOCOR) 20 MG tablet Take 20 mg by mouth every evening.       No current facility-administered medications for this visit.    Allergies:   No Known Allergies  Social History:  The patient  reports that he has quit smoking. He does not have any smokeless tobacco history on file.   Family History:  The patient's family history is not on file.   ROS:  Please see the history of present illness.      All other systems reviewed and negative.  PHYSICAL EXAM: VS:  BP 124/86  Pulse 85  Ht 6' 1.5" (1.867 m)  Wt 269 lb 12.8 oz (122.38 kg)  BMI 35.11 kg/m2 Well nourished, well developed, in no acute distress HEENT: normal Neck: no JVD Cardiac:  normal S1, S2; irregularly irregular; no murmur Lungs:  clear to auscultation bilaterally, no wheezing, rhonchi or rales Abd: soft, nontender, no hepatomegaly Ext: no edema Skin: warm and dry Neuro:  CNs 2-12 intact, no focal abnormalities noted  ASSESSMENT AND PLAN:  1. ASCAD with known occluded SVG to PDA and long high grade stenosis of mid RCA by cath 9/13 not good lesion for PCI now on medical management with no angina. - continue Imdur - not on ASA due to Pradaxa - BMET ok - done at PCP June 2015 - creatinine 1.1 2. Dyslipidemia - LDL was 66 on lipids 11/2013 - recheck FLP and ALT - continue simvastatin 3. HTN well controlled -  continue Amlodipine/Lisinopril 4. Chronic atrial fibrillation rate controlled - continue Pradaxa 5. Obesity 6. Mildly dilated aortic root - recheck 2D echo for progression  Followup with me in 6 months  Signed, Fransico Him, MD [facility] 09/07/2014 2:04 PM

## 2014-09-07 NOTE — Patient Instructions (Addendum)
Your physician recommends that you continue on your current medications as directed. Please refer to the Current Medication list given to you today.  Your physician recommends that you return for a FASTING lipid profile and ALT please schedule at check out before leaving today.  Your physician has requested that you have an echocardiogram. Echocardiography is a painless test that uses sound waves to create images of your heart. It provides your doctor with information about the size and shape of your heart and how well your heart's chambers and valves are working. This procedure takes approximately one hour. There are no restrictions for this procedure.  Your physician wants you to follow-up in 1 year  with Dr Mallie Snooks will receive a reminder letter in the mail two months in advance. If you don't receive a letter, please call our office to schedule the follow-up appointment.

## 2014-09-13 ENCOUNTER — Encounter: Payer: Self-pay | Admitting: Cardiology

## 2014-09-17 ENCOUNTER — Other Ambulatory Visit: Payer: BC Managed Care – PPO

## 2014-09-17 ENCOUNTER — Other Ambulatory Visit (HOSPITAL_COMMUNITY): Payer: BC Managed Care – PPO

## 2014-10-25 ENCOUNTER — Telehealth: Payer: Self-pay | Admitting: Cardiology

## 2014-10-25 NOTE — Telephone Encounter (Signed)
Patient st that he tripped and fell at about 1300 today and wanted to talk about precautions since he's on Pradaxa. He st he caught himself with his arms, but hit his stomach on the floor and it is sore. Nothing else was injured. Patient talked to Pradaxa rep before talking with [place], and they said he should be fine. Patient wanted to check with Dr. Radford Pax about bleeding risk as reinforcement.  Patient st he cancelled an ECHO 9/28 because his insurance will not pay for an outpatient procedure. Pt asked if we would order the ECHO in-patient instead, and he was told we cannot do that. Pt st he is starting new insurance 12-21-14 that will cover it and wants to know if ECHO can be scheduled after that.  Routing to Dr. Radford Pax for review and recommendations.

## 2014-10-25 NOTE — Telephone Encounter (Signed)
Please have patient see his PCP today or go to urgent care to make sure is abdomen is ok

## 2014-10-25 NOTE — Telephone Encounter (Signed)
New message  Pt called requests a call back to discuss how pradaxa works. He reports he has fallen, he is not in pain, and he feels fine. Please call back to discuss how Pradax works .

## 2014-10-25 NOTE — Telephone Encounter (Signed)
Left message on VM for patient to see his PCP or go to urgent care today to have his abdomen checked out.

## 2014-11-14 ENCOUNTER — Other Ambulatory Visit: Payer: Self-pay | Admitting: *Deleted

## 2014-11-14 MED ORDER — ISOSORBIDE MONONITRATE ER 30 MG PO TB24: 30.0000 mg | ORAL_TABLET | Freq: Two times a day (BID) | ORAL | Status: DC

## 2014-11-29 ENCOUNTER — Encounter (HOSPITAL_COMMUNITY): Payer: Self-pay | Admitting: Cardiology

## 2015-06-27 ENCOUNTER — Encounter: Payer: Self-pay | Admitting: Cardiology

## 2015-08-27 ENCOUNTER — Encounter: Payer: Self-pay | Admitting: Cardiology

## 2015-09-11 ENCOUNTER — Other Ambulatory Visit: Payer: Self-pay | Admitting: *Deleted

## 2015-09-11 MED ORDER — ISOSORBIDE MONONITRATE ER 30 MG PO TB24: 30.0000 mg | ORAL_TABLET | Freq: Two times a day (BID) | ORAL | Status: DC

## 2015-09-26 ENCOUNTER — Ambulatory Visit: Payer: Self-pay | Admitting: Cardiology

## 2015-10-16 NOTE — Progress Notes (Signed)
Cardiology Office Note   Date:  10/17/2015   ID:  Ross [name], DOB 1956/07/31, MRN [id number]  PCP:  Orpah Melter, MD    Chief Complaint  Patient presents with  . Hypertension  . Coronary Artery Disease  . Hyperlipidemia      History of Present Illness: Ross [name] is a 59 y.o. male with a history of ASCAD s/p CABG with last cath 9/13 showing occluded SVG to PDA and high grade stenosis of the mid RCA deemed not suitable for PCI due to length of lesion now on medical management, HTN, type II DM,obesity, mildly dilated aortic root and chronic AF. He is on permanent disability. He is doing well today. He denies any LE edema, palpitations or syncope. He has chronic stable angina that is controlled with Imdur but ran out recently on his Imdur and has had some mild angina. He has chronic SOB which occurs with exertion but not at rest.  The SOB seems to have worsened with running out of Imdur.   His exercise is limited due to chronic DJD of his knees.  He does go to [employer] and ride the bike.    Past Medical History  Diagnosis Date  . Diabetes mellitus   . Hypertension   . Carpal tunnel syndrome   . Cerebral aneurysm   . Obesity   . Dyslipidemia   . Anxiety   . Peripheral neuropathy ([place])   . Coronary artery disease     a. s/p CABG;  b. Myoview (7/13):  apical anterior ischemia; c. LHC (08/2012): dLM 60%, mid LAD 90%, pCFX 40%, OM1 occluded, RCA 80-90%, distal RCA 60-70%, SVG-Dx patent, SVG-OM1 patent, LIMA-LAD patent, SVG-PDA occluded, EF 55%. => RCA dsz too long and would req multiple stents; pt refused CABG => med Rx.  Marland Kitchen Hx of echocardiogram     a. Echocardiogram (05/2013): Mild LVH, EF 54.8%, moderate LAE, mild aortic root dilatation, trace pulmonic regurgitation  . DJD (degenerative joint disease)   . Atrial fibrillation ([facility])     rate control strategy; LA large    Past Surgical History  Procedure Laterality Date  . Left heart catheterization  with coronary angiogram N/A 08/23/2012    Procedure: LEFT HEART CATHETERIZATION WITH CORONARY ANGIOGRAM;  Surgeon: Sueanne Margarita, MD;  Location: [facility];  Service: Cardiovascular;  Laterality: N/A;     Current Outpatient Prescriptions  Medication Sig Dispense Refill  . ALPRAZolam (XANAX) 1 MG tablet Take 0.5-1 mg by mouth 2 (two) times daily as needed. For anxiety    . amLODipine (NORVASC) 10 MG tablet Take 5 mg by mouth every 12 (twelve) hours. Patient has to take 12 hours apart.    . dabigatran (PRADAXA) 150 MG CAPS capsule Take 1 capsule (150 mg total) by mouth every 12 (twelve) hours. 180 capsule 3  . isosorbide mononitrate (IMDUR) 30 MG 24 hr tablet Take 1 tablet (30 mg total) by mouth 2 (two) times daily. 180 tablet 0  . lisinopril (PRINIVIL,ZESTRIL) 20 MG tablet Take 20 mg by mouth daily.    . Oxycodone HCl 10 MG TABS Take 1 tablet by mouth 4 (four) times daily as needed.  0  . simvastatin (ZOCOR) 20 MG tablet Take 20 mg by mouth every evening.     No current facility-administered medications for this visit.    Allergies:   Review of patient's allergies indicates no known  allergies.    Social History:  The patient  reports that he has quit smoking. He does not have any smokeless tobacco history on file.   Family History:  The patient's family history is not on file.    ROS:  Please see the history of present illness.   Otherwise, review of systems are positive for none.   All other systems are reviewed and negative.    PHYSICAL EXAM: VS:  BP 112/74 mmHg  Pulse 51  Ht 6' 1.5" (1.867 m)  Wt 253 lb (114.76 kg)  BMI 32.92 kg/m2 , BMI Body mass index is 32.92 kg/(m^2). GEN: Well nourished, well developed, in no acute distress HEENT: normal Neck: no JVD, carotid bruits, or masses Cardiac: RRR; no murmurs, rubs, or gallops,no edema  Respiratory:  clear to auscultation bilaterally, normal work of breathing GI: soft, nontender, nondistended, + BS MS: no deformity or  atrophy Skin: warm and dry, no rash Neuro:  Strength and sensation are intact Psych: euthymic mood, full affect   EKG:  EKG was ordered today showing atrial fibrillation with HR 48bpm with no ST changes    Recent Labs: No results found for requested labs within last 365 days.    Lipid Panel No results found for: CHOL, TRIG, HDL, CHOLHDL, VLDL, LDLCALC, LDLDIRECT    Wt Readings from Last 3 Encounters:  10/17/15 253 lb (114.76 kg)  09/07/14 269 lb 12.8 oz (122.38 kg)  12/01/13 279 lb (126.554 kg)     ASSESSMENT AND PLAN:  1. ASCAD with known occluded SVG to PDA and long high grade stenosis of mid RCA by cath 9/13 not good lesion for PCI now on medical management with no angina. - continue Imdur - I will give him a refill until his mail order meds come in - not on ASA due to Pradaxa 2. Dyslipidemia - LDL was 66 on lipids 11/2013 - recheck FLP and ALT - continue simvastatin 3. HTN well controlled - continue Amlodipine/Lisinopril 4. Chronic atrial fibrillation rate controlled but slow and is fatigued.  He says that his HR gets into the mid 30's at rest.   - continue Pradaxa -  Check a 24 hour Holter to assess average heart rate.   5. Obesity 6. Mildly dilated aortic root - recheck 2D echo for progression   Current medicines are reviewed at length with the patient today.  The patient does not have concerns regarding medicines.  The following changes have been made:  no change  Labs/ tests ordered today: See above Assessment and Plan No orders of the defined types were placed in this encounter.     Disposition:   FU with me in 1 year  Signed, Sueanne Margarita, MD  10/17/2015 9:42 AM    [facility] [address], [place], [place]  [address] Phone: [phone]; Fax: [phone]

## 2015-10-17 ENCOUNTER — Ambulatory Visit (INDEPENDENT_AMBULATORY_CARE_PROVIDER_SITE_OTHER): Payer: Medicare Other | Admitting: Cardiology

## 2015-10-17 ENCOUNTER — Encounter: Payer: Self-pay | Admitting: Cardiology

## 2015-10-17 VITALS — BP 112/74 | HR 51 | Ht 73.5 in | Wt 253.0 lb

## 2015-10-17 DIAGNOSIS — I2510 Atherosclerotic heart disease of native coronary artery without angina pectoris: Secondary | ICD-10-CM | POA: Diagnosis not present

## 2015-10-17 DIAGNOSIS — I10 Essential (primary) hypertension: Secondary | ICD-10-CM | POA: Diagnosis not present

## 2015-10-17 DIAGNOSIS — I482 Chronic atrial fibrillation, unspecified: Secondary | ICD-10-CM

## 2015-10-17 DIAGNOSIS — I77810 Thoracic aortic ectasia: Secondary | ICD-10-CM | POA: Diagnosis not present

## 2015-10-17 DIAGNOSIS — E785 Hyperlipidemia, unspecified: Secondary | ICD-10-CM

## 2015-10-17 DIAGNOSIS — R001 Bradycardia, unspecified: Secondary | ICD-10-CM

## 2015-10-17 NOTE — Patient Instructions (Signed)
Medication Instructions:  Your physician recommends that you continue on your current medications as directed. Please refer to the Current Medication list given to you today.   Labwork: FASTING LABS IN ONE WEEK: BMET, LFTs, Lipids  Testing/Procedures: Your physician has requested that you have an echocardiogram. Echocardiography is a painless test that uses sound waves to create images of your heart. It provides your doctor with information about the size and shape of your heart and how well your heart's chambers and valves are working. This procedure takes approximately one hour. There are no restrictions for this procedure.   Your physician has recommended that you wear a holter monitor. Holter monitors are medical devices that record the heart's electrical activity. Doctors most often use these monitors to diagnose arrhythmias. Arrhythmias are problems with the speed or rhythm of the heartbeat. The monitor is a small, portable device. You can wear one while you do your normal daily activities. This is usually used to diagnose what is causing palpitations/syncope (passing out).  Follow-Up: Your physician wants you to follow-up in: 1 year with Dr. Radford Pax. You will receive a reminder letter in the mail two months in advance. If you don't receive a letter, please call our office to schedule the follow-up appointment.   Any Other Special Instructions Will Be Listed Below (If Applicable).     If you need a refill on your cardiac medications before your next appointment, please call your pharmacy.

## 2015-10-22 ENCOUNTER — Telehealth: Payer: Self-pay | Admitting: *Deleted

## 2015-10-22 ENCOUNTER — Telehealth: Payer: Self-pay

## 2015-10-22 NOTE — Telephone Encounter (Signed)
Discussed what a 48 hour holter monitor would cost.  In house $325 would by billed to insurance and if [facility] or [facility] is used it may be slightly higher.  A holter monitor is a not test requiring precertification.

## 2015-10-22 NOTE — Telephone Encounter (Signed)
LEFT MESSAGE ON PATIENTS VOICEMAIL PRECERT AUTH# HAS BEEN OBTAINED AUTH# [id number] EXPIRES 12/06/2015 FOR  CPT CODE 46962 (ECHOCARDIOGRAM)

## 2015-10-24 ENCOUNTER — Ambulatory Visit (HOSPITAL_COMMUNITY): Payer: Medicare Other | Attending: Internal Medicine

## 2015-10-24 ENCOUNTER — Other Ambulatory Visit: Payer: Self-pay

## 2015-10-24 ENCOUNTER — Other Ambulatory Visit (INDEPENDENT_AMBULATORY_CARE_PROVIDER_SITE_OTHER): Payer: Medicare Other | Admitting: *Deleted

## 2015-10-24 ENCOUNTER — Telehealth: Payer: Self-pay

## 2015-10-24 ENCOUNTER — Ambulatory Visit (INDEPENDENT_AMBULATORY_CARE_PROVIDER_SITE_OTHER): Payer: Medicare Other

## 2015-10-24 DIAGNOSIS — I10 Essential (primary) hypertension: Secondary | ICD-10-CM | POA: Diagnosis not present

## 2015-10-24 DIAGNOSIS — F17200 Nicotine dependence, unspecified, uncomplicated: Secondary | ICD-10-CM | POA: Insufficient documentation

## 2015-10-24 DIAGNOSIS — I482 Chronic atrial fibrillation, unspecified: Secondary | ICD-10-CM

## 2015-10-24 DIAGNOSIS — E785 Hyperlipidemia, unspecified: Secondary | ICD-10-CM | POA: Insufficient documentation

## 2015-10-24 DIAGNOSIS — I77810 Thoracic aortic ectasia: Secondary | ICD-10-CM | POA: Diagnosis not present

## 2015-10-24 DIAGNOSIS — R001 Bradycardia, unspecified: Secondary | ICD-10-CM | POA: Diagnosis not present

## 2015-10-24 DIAGNOSIS — I517 Cardiomegaly: Secondary | ICD-10-CM | POA: Diagnosis not present

## 2015-10-24 LAB — HEPATIC FUNCTION PANEL
ALT: 20 U/L (ref 9–46)
AST: 18 U/L (ref 10–35)
Albumin: 4 g/dL (ref 3.6–5.1)
Alkaline Phosphatase: 72 U/L (ref 40–115)
BILIRUBIN DIRECT: 0.1 mg/dL (ref <=0.2)
BILIRUBIN INDIRECT: 0.3 mg/dL (ref 0.2–1.2)
BILIRUBIN TOTAL: 0.4 mg/dL (ref 0.2–1.2)
Total Protein: 6.4 g/dL (ref 6.1–8.1)

## 2015-10-24 LAB — LIPID PANEL
CHOL/HDL RATIO: 2.5 ratio (ref <=5.0)
Cholesterol: 96 mg/dL — ABNORMAL LOW (ref 125–200)
HDL: 39 mg/dL — ABNORMAL LOW (ref >=40)
LDL Cholesterol: 33 mg/dL (ref <130)
Triglycerides: 120 mg/dL (ref <150)
VLDL: 24 mg/dL (ref <30)

## 2015-10-24 LAB — BASIC METABOLIC PANEL
BUN: 17 mg/dL (ref 7–25)
CO2: 24 mmol/L (ref 20–31)
Calcium: 10.2 mg/dL (ref 8.6–10.3)
Chloride: 109 mmol/L (ref 98–110)
Creat: 1.07 mg/dL (ref 0.70–1.25)
Glucose, Bld: 102 mg/dL — ABNORMAL HIGH (ref 65–99)
POTASSIUM: 4.3 mmol/L (ref 3.5–5.3)
SODIUM: 140 mmol/L (ref 135–146)

## 2015-10-24 LAB — TESTOSTERONE: TESTOSTERONE: 240 ng/dL — AB (ref 300–890)

## 2015-10-24 NOTE — Telephone Encounter (Signed)
-----   Message from Sueanne Margarita, MD sent at 10/24/2015  2:15 PM EDT ----- Echo with normal LVF and dilated aortic root with mildly enlarged atria.  - repeat echo in 1 year for dilated aortic root

## 2015-10-24 NOTE — Addendum Note (Signed)
Addended by: Eulis Foster on: 10/24/2015 02:21 PM   Modules accepted: Orders

## 2015-10-24 NOTE — Telephone Encounter (Signed)
Patient in office to get heart monitor. Informed patient of results and verbal understanding expressed.   Repeat ECHO ordered to be scheduled in 1 year. Patient agrees with treatment plan.

## 2015-10-25 ENCOUNTER — Telehealth: Payer: Self-pay | Admitting: *Deleted

## 2015-10-25 NOTE — Telephone Encounter (Signed)
Spoke with pt and reviewed recent lab results with him.  Will forward to primary care per pt's request.  Pt is concerned about low testosterone level and is asking what should be done about this.  I asked him to follow up with primary care regarding this.  Pt requesting copy of lab results.  I told pt I would leave copy of results at front desk for him to pick up when he returns monitor.

## 2015-10-29 ENCOUNTER — Telehealth: Payer: Self-pay

## 2015-10-29 NOTE — Telephone Encounter (Signed)
Patient showed 4 second pauses during waking hours.  Per Dr. Acie Fredrickson, patient to have TSH drawn.  Left message to call back.

## 2015-10-30 NOTE — Telephone Encounter (Signed)
Please get results of HOlter monitor

## 2015-10-30 NOTE — Telephone Encounter (Signed)
Was he symptomatic?

## 2015-10-30 NOTE — Telephone Encounter (Signed)
Patient st he is not having any new symptoms.  He is chronically tired (he st he has been for years) and "never feels really well."  Patient st he had his thyroid checked by his PCP recently. Labs requested.  To Dr. Radford Pax.

## 2015-10-31 NOTE — Telephone Encounter (Signed)
Informed patient of results who had many questions. Addressed questions. Patient aware scheduler will call him to arrange consult w/ EP. Informed Dr. Theodosia Blender nurse, Valetta Fuller, patient was inquiring about TSH results from PCP and if we received results yet.

## 2015-10-31 NOTE — Telephone Encounter (Signed)
Notes Recorded by Sueanne Margarita, MD on 10/30/2015 at 8:39 PM Multiple long pauses - he has fatigue - refer to EP

## 2015-11-07 ENCOUNTER — Other Ambulatory Visit: Payer: Self-pay | Admitting: Cardiology

## 2015-11-20 ENCOUNTER — Encounter: Payer: Self-pay | Admitting: Cardiology

## 2015-11-20 ENCOUNTER — Ambulatory Visit (INDEPENDENT_AMBULATORY_CARE_PROVIDER_SITE_OTHER): Payer: Medicare Other | Admitting: Cardiology

## 2015-11-20 VITALS — BP 120/76 | HR 75 | Ht 73.0 in | Wt 252.2 lb

## 2015-11-20 DIAGNOSIS — I482 Chronic atrial fibrillation, unspecified: Secondary | ICD-10-CM

## 2015-11-20 NOTE — Progress Notes (Signed)
Electrophysiology Office Note   Date:  11/20/2015   ID:  Ross [name], DOB 1956/03/10, MRN [id number]  PCP:  Orpah Melter, MD  Cardiologist:  Fransico Him Primary Electrophysiologist: Cindee Mclester Meredith Leeds, MD    No chief complaint on file.    History of Present Illness: Ross [name] is a 59 y.o. male who presents today for electrophysiology evaluation.    He has a history of coronary artery disease status post CABG with his last 9/13 showing an occluded SVG to PDA and high-grade stenosis of the mid RCA not suitable for PCI. He also has hypertension type 2 diabetes obesity and a mildly dilated aortic root. He has chronic atrial fibrillation. He has chronic stable angina that is treated with Imdur. He does have atrial fibrillation and wore a 48-hour monitor which showed heart rates down into the 30s with symptoms. He also has pauses up to 4 seconds on his monitor. He complains of fatigue there is been going on for many years. He does have low heart rate at home down to the 30s at times.   of note he does say that a brain aneurysm was found at the time of his CABG but he has not had this fixed and does not want it fixed.  Today, he denies symptoms of palpitations, chest pain, shortness of breath, orthopnea, PND, lower extremity edema, claudication, dizziness, presyncope, syncope, bleeding, or neurologic sequela. The patient is tolerating medications without difficulties and is otherwise without complaint today.    Past Medical History  Diagnosis Date  . Diabetes mellitus   . Hypertension   . Carpal tunnel syndrome   . Cerebral aneurysm   . Obesity   . Dyslipidemia   . Anxiety   . Peripheral neuropathy ([place])   . Coronary artery disease     a. s/p CABG;  b. Myoview (7/13):  apical anterior ischemia; c. LHC (08/2012): dLM 60%, mid LAD 90%, pCFX 40%, OM1 occluded, RCA 80-90%, distal RCA 60-70%, SVG-Dx patent, SVG-OM1 patent, LIMA-LAD patent, SVG-PDA occluded, EF 55%. => RCA dsz too  long and would req multiple stents; pt refused CABG => med Rx.  Marland Kitchen Hx of echocardiogram     a. Echocardiogram (05/2013): Mild LVH, EF 54.8%, moderate LAE, mild aortic root dilatation, trace pulmonic regurgitation  . DJD (degenerative joint disease)   . Atrial fibrillation ([facility])     rate control strategy; LA large   Past Surgical History  Procedure Laterality Date  . Left heart catheterization with coronary angiogram N/A 08/23/2012    Procedure: LEFT HEART CATHETERIZATION WITH CORONARY ANGIOGRAM;  Surgeon: Sueanne Margarita, MD;  Location: [facility];  Service: Cardiovascular;  Laterality: N/A;     Current Outpatient Prescriptions  Medication Sig Dispense Refill  . ALPRAZolam (XANAX) 1 MG tablet Take 0.5-1 mg by mouth 2 (two) times daily as needed. For anxiety    . amLODipine (NORVASC) 10 MG tablet Take 5 mg by mouth every 12 (twelve) hours. Patient has to take 12 hours apart.    . dabigatran (PRADAXA) 150 MG CAPS capsule Take 1 capsule (150 mg total) by mouth every 12 (twelve) hours. 180 capsule 3  . isosorbide mononitrate (IMDUR) 30 MG 24 hr tablet Take 1 tablet by mouth two  times daily 180 tablet 3  . lisinopril (PRINIVIL,ZESTRIL) 20 MG tablet Take 20 mg by mouth daily.    . Oxycodone HCl 10 MG TABS Take 1 tablet by mouth 4 (four) times daily as needed.  0  .  simvastatin (ZOCOR) 20 MG tablet Take 20 mg by mouth every evening.     No current facility-administered medications for this visit.    Allergies:   Review of patient's allergies indicates no known allergies.   Social History:  The patient  reports that he has quit smoking. He does not have any smokeless tobacco history on file.   Family History:  The patient's family history includes Alcohol abuse (age of onset: 65) in his father; Asthma in his maternal grandmother and mother; Colon cancer in his paternal grandfather; Colon polyps (age of onset: 43) in his mother; Drug abuse (age of onset: 80) in his sister; Heart Problems in his  maternal grandfather and paternal grandmother; Other in his sister; Other (age of onset: 105) in his brother.    ROS:  Please see the history of present illness.   Otherwise, review of systems is positive for a recent cold with fatigue, sweating and chills.   All other systems are reviewed and negative.    PHYSICAL EXAM: VS:  There were no vitals taken for this visit. , BMI There is no weight on file to calculate BMI. GEN: Well nourished, well developed, in no acute distress HEENT: normal Neck: no JVD, carotid bruits, or masses Cardiac: RRR; no murmurs, rubs, or gallops,no edema  Respiratory:  clear to auscultation bilaterally, normal work of breathing GI: soft, nontender, nondistended, + BS MS: no deformity or atrophy Skin: warm and dry Neuro:  Strength and sensation are intact Psych: euthymic mood, full affect  EKG:  EKG is ordered today. The ekg ordered today shows atrial fibrillation, rate 75  Recent Labs: 10/24/2015: ALT 20; BUN 17; Creat 1.07; Potassium 4.3; Sodium 140    Lipid Panel     Component Value Date/Time   CHOL 96* 10/24/2015 1340   TRIG 120 10/24/2015 1340   HDL 39* 10/24/2015 1340   CHOLHDL 2.5 10/24/2015 1340   VLDL 24 10/24/2015 1340   LDLCALC 33 10/24/2015 1340     Wt Readings from Last 3 Encounters:  10/17/15 253 lb (114.76 kg)  09/07/14 269 lb 12.8 oz (122.38 kg)  12/01/13 279 lb (126.554 kg)      Other studies Reviewed: Additional studies/ records that were reviewed today include: 48 hour monitor 10/24/15  Review of the above records today demonstrates:   atrial fib with slow ventricular response. At times he has significantly slow ventricular response with greater than 4 second pauses.  occasional PVCs.  TTE 10/24/15 - Left ventricle: The cavity size was mildly dilated. Wall thickness was normal. Systolic function was normal. The estimated ejection fraction was in the range of 55% to 60%. - Aorta: Aortic root is 40 mm in diameter. -  Left atrium: The atrium was mildly to moderately dilated. - Right atrium: The atrium was mildly dilated.  ASSESSMENT AND PLAN:  1.  Chronic atrial fibrillation: currently anticoagulated with Pradaxa. He does not require rate control at this time as he has known bradycardia.  This patients CHA2DS2-VASc Score and unadjusted Ischemic Stroke Rate (% per year) is equal to 3.2 % stroke rate/year from a score of 3  Above score calculated as 1 point each if present [CHF, HTN, DM, Vascular=MI/PAD/Aortic Plaque, Age if 65-74, or Male] Above score calculated as 2 points each if present [Age > 75, or Stroke/TIA/TE]  2.   Symptomatic bradycardia:  Heart rates into the 30s with symptoms as well as pauses of 4 seconds. This likely  Is caused by significant conduction system disease.  Loys Shugars plan on pacemaker placement.    Current medicines are reviewed at length with the patient today.   The patient does not have concerns regarding his medicines.  The following changes were made today:  none  Labs/ tests ordered today include:  No orders of the defined types were placed in this encounter.     Disposition:   FU with Relena Ivancic post pacemaker  Signed, Sunday Klos Meredith Leeds, MD  11/20/2015 2:05 PM     [place] [address] [phone] (office) [phone] (fax)

## 2015-11-20 NOTE — Patient Instructions (Addendum)
Medication Instructions:  Your physician recommends that you continue on your current medications as directed. Please refer to the Current Medication list given to you today.  Labwork: None ordered  Testing/Procedures: Your physician has recommended that you have a pacemaker inserted. A pacemaker is a small device that is placed under the skin of your chest or abdomen to help control abnormal heart rhythms. This device uses electrical pulses to prompt the heart to beat at a normal rate. Pacemakers are used to treat heart rhythms that are too slow. Wire (leads) are attached to the pacemaker that goes into the chambers of you heart. This is done in the hospital and usually requires and overnight stay.   Available dates (these are subject to change): 1/5, 1/13, 1/26, 1/31  Please call Osias Resnick, RN when you are ready to schedule procedure.  [id number]  Follow-Up: To be determined once procedure is scheduled.  If you need a refill on your cardiac medications before your next appointment, please call your pharmacy.  Thank you for choosing [facility]!!   Trinidad Curet, RN [phone]   Pacemaker Implantation The heart has its own electrical system, or natural pacemaker, to regulate the heartbeat. Sometimes, the natural pacemaker system of the heart fails and causes the heart to beat too slowly. If this happens, a pacemaker can be surgically placed to help the heart beat at a normal or programmed rate. A pacemaker is a small, battery-powered device that is placed under the skin and is programmed to sense your heartbeats. If your heart rate is lower than the programmed rate, the pacemaker will pace your heart. Parts of a pacemaker include:  Wires or leads. The leads are placed in the heart and transmit electricity to the heart. The leads are connected to the pulse generator.  Pulse generator. The pulse generator contains a computer and a memory system. The pulse generator also produces the  electrical signal that triggers the heart to beat. A pacemaker may be placed if:  You have a slow heartbeat (bradycardia).  You have fainting (syncope).  Shortness of breath (dyspnea) due to heart problems. LET [facility] CARE PROVIDER KNOW ABOUT:  Any allergies you may have.  All medicines you are taking, including vitamins, herbs, eye drops, creams, and over-the-counter medicines.  Previous problems you or members of your family have had with the use of anesthetics.  Any blood disorders you have.  Previous surgeries you have had.  Medical conditions you have.  Possibility of pregnancy, if this applies. RISKS AND COMPLICATIONS Generally, pacemaker implantation is a safe procedure. However, problems can occur and include:  Bleeding.  Unable to place the pacemaker under local sedation.  Infection. BEFORE THE PROCEDURE  You will have blood work drawn before the procedure.  Do not use any tobacco products including cigarettes, chewing tobacco, or electronic cigarettes. If you need help quitting, ask your health care provider.  Do not eat or drink anything after midnight on the night before the procedure or as directed by your health care provider.  Ask your health care provider about:  Changing or stopping your regular medicines. This is especially important if you are taking diabetes medicines or blood thinners.  Taking medicines such as aspirin and ibuprofen. These medicines can thin your blood. Do not take these medicines before your procedure if your health care provider asks you not to.  Ask your health care provider if you can take a sip of water with any approved medicines the morning of the procedure.  PROCEDURE  The surgery to place a pacemaker is considered a minimally invasive surgical procedure. It is done under a local anesthetic, which is an injection at the incision site that makes the skin numb. You are also given sedation and pain medicine that makes you  drowsy during the procedure.   An intravenous line (IV) will be started in your hand or arm so sedation and pain medicine can be given during the pacemaker procedure.  A numbing medicine will be injected into the skin where the pacemaker is to be placed. A small incision will then be made into the skin. The pacemaker is usually placed under the skin near the collarbone.  After the incision has been made, the leads will be inserted into a large vein and guided into the heart using X-ray.  Using the same incision that was used to place the leads, a small pocket will be created under the skin to hold the pulse generator. The leads will then be connected to the pulse generator.  The incision site will then be closed. A bandage (dressing) is placed over the pacemaker site. The dressing is removed 24-48 hours afterward. AFTER THE PROCEDURE  You will be taken to a recovery area after the pacemaker implant. Your vital signs such as blood pressure, heart rate, breathing, and oxygen levels will be monitored.  A chest X-ray will be done after the pacemaker has been implanted. This is to make sure the pacemaker and leads are in the correct place.   This information is not intended to replace advice given to you by your health care provider. Make sure you discuss any questions you have with your health care provider.   Document Released: 11/27/2002 Document Revised: 12/28/2014 Document Reviewed: 04/12/2012 Elsevier Interactive Patient Education 2016 Elsevier Inc.   Pacemaker Implantation, Care After Refer to this sheet over the next few weeks. These instructions provide you with information on caring for yourself after the procedure. Your health care provider may also give you more specific instructions. Your treatment has been planned according to current medical practices, but problems sometimes occur. Call your health care provider if you have any problems or questions regarding your pacemaker.  WHAT  TO EXPECT AFTER THE PROCEDURE  You may feel pain. Some pain is normal. It may last a few days.  A slight bump may be seen over the skin where the device was placed. Sometimes, it is possible to feel the device under the skin. This is normal.  In the months and years afterward, your health care provider will check the device, the leads, and the battery every few months. Eventually, when the battery is low, the device will be replaced. HOME CARE INSTRUCTIONS Medicines  Take medicines only as directed by your health care provider.  If you were prescribed an antibiotic medicine, finish it all even if you start to feel better.  Do not take any other medicines without asking your health care provider first. Some medicines, including certain painkillers, can cause bleeding in your stomach after surgery. Wound Care  Do not remove the bandage on your chest until directed to do so by your health care provider.  After your bandage is removed, you may see pieces of tape called skin adhesive strips over the area where the cut was made (incision site). Let them fall off on their own.  Check the incision site every day to make sure it is not infected, bleeding, or starting to pull apart.  Do not use lotions or  ointments near the incision site unless directed to do so.  Keep the incision area clean and dry for 2-3 days after the procedure or as directed by your health care provider. It takes several weeks for the incision site to completely heal.  Do not take baths, swim, or use a hot tub until your health care provider approves. Activities  Try to walk a little every day. Exercising is important after this procedure. It is also important to use your shoulder on the side of the pacemaker in daily tasks that do not require exaggerated motion.  Avoid sudden jerking, pulling, or chopping movements that pull your upper arm far away from your body for at least 6 weeks.  Do not lift your upper arm above  your shoulders for at least 6 weeks. This means no tennis, golf, or swimming for this period of time. If you sleep with the arm above your head, use a restraint to prevent this from happening as you sleep.  You may go back to work when your health care provider says it is okay. Check with your health care provider before you start to drive or play sports. Other Instructions  Follow diet instructions if they were provided. You should be able to eat what you usually do right away, but you may need to limit your salt intake.  Weigh yourself every day. If you suddenly gain weight, fluid may be building up in your body.  Always carry your pacemaker identification card with you. The card should list the implant date, device model, and manufacturer. Consider wearing a medical alert bracelet or necklace.  Tell all health care providers that you have a pacemaker. This may prevent them from giving you a magnetic resource imaging scan (MRI) because of the strong magnets used during that test.  If you must pass through a metal detector, quickly walk through it. Do not stop under the detector or stand near it.  Avoid places or objects with a strong electric or magnetic field, including:  Engineer, maintenance. When at the airport, let officials know you have a pacemaker. Your ID card will let you be checked in a way that is safe for you and that will not damage your pacemaker. Also, do not let a security person wave a magnetic wand near your pacemaker. That can make it stop working.  Power plants.  Large electrical generators.  Radiofrequency transmission towers, such as cell phone and radio towers.  Do not use amateur (ham) radio equipment or electric (arc) welding torches. Some devices are safe to use if held at least 1 foot from your pacemaker. These include power tools, lawn mowers, and speakers. If you are unsure of whether something is safe to use, ask your health care provider.  You may safely  use electric blankets, heating pads, computers, and microwave ovens.  When using your cell phone, hold it to the ear opposite the pacemaker. Do not leave your cell phone in a pocket over the pacemaker.  Keep all follow-up visits as directed by your health care provider. This is how your health care provider makes sure your chest is healing the way it should. Ask your health care provider when you should come back to have your stitches or staples taken out.  Have your pacemaker checked every 3-6 months or as directed by your health care provider. Most pacemakers last for 4-8 years before a new one is needed. SEEK MEDICAL CARE IF:  You gain weight suddenly.  Your legs  or feet swell more than they have before.  It feels like your heart is fluttering or skipping beats (heart palpitations).  You have a fever. SEEK IMMEDIATE MEDICAL CARE IF:  You have chest pain.  You feel more short of breath than you have felt before.  You feel more light-headed than you have felt before.  You have problems with your incision site, such as swelling or bleeding, or it starts to open up.  You have drainage, redness, swelling, or pain at your incision site.   This information is not intended to replace advice given to you by your health care provider. Make sure you discuss any questions you have with your health care provider.   Document Released: 06/26/2005 Document Revised: 12/28/2014 Document Reviewed: 04/08/2012 Elsevier Interactive Patient Education [employer].

## 2015-11-27 ENCOUNTER — Institutional Professional Consult (permissible substitution): Payer: Medicare Other | Admitting: Internal Medicine

## 2015-12-04 ENCOUNTER — Other Ambulatory Visit: Payer: Self-pay | Admitting: *Deleted

## 2015-12-04 ENCOUNTER — Telehealth: Payer: Self-pay | Admitting: Cardiology

## 2015-12-04 MED ORDER — NITROGLYCERIN 0.4 MG SL SUBL: 0.4000 mg | SUBLINGUAL_TABLET | SUBLINGUAL | Status: DC | PRN

## 2015-12-04 NOTE — Telephone Encounter (Addendum)
Patient calling to request a refill on Nitrostat from 2015. He st he never took the drug, but "given his heart health and needing a pacemaker he thought he'd ask." Informed the patient that he is on Imdur, and asked him why he needed the Nitrostat when he st he never took it when he had the Rx. Patient st he has chest discomfort at least every 3 nights, but it is no different than it has been for the last 3 years at least. Patient st he refuses to increase Imdur because "the drug scares him." At the end of the conversation, the patient stated he does not want to start Nitrostat either because that scares him as well. He just wants Dr. Radford Pax to know what's going on and how he's feeling.

## 2015-12-04 NOTE — Telephone Encounter (Signed)
°*  STAT* If patient is at the pharmacy, call can be transferred to refill team.   1. Which medications need to be refilled? (please list name of each medication and dose if known) Nitrostat  2. Which pharmacy/location (including street and city if local pharmacy) is medication to be sent to? [facility] in [place]  3. Do they need a 30 day or 90 day supply? 77 Day    Pt also wants for Dr. Theodosia Blender nurse to call him because he has a "general question" pt will not elaborate.

## 2015-12-05 ENCOUNTER — Telehealth: Payer: Self-pay | Admitting: Cardiology

## 2015-12-05 NOTE — Telephone Encounter (Signed)
Please have him come in to see the extender

## 2015-12-05 NOTE — Telephone Encounter (Signed)
New Message  Pt calling to speak w/ RN about ICD placement. Please call back and discuss.

## 2015-12-06 ENCOUNTER — Telehealth: Payer: Self-pay | Admitting: *Deleted

## 2015-12-06 ENCOUNTER — Encounter: Payer: Self-pay | Admitting: *Deleted

## 2015-12-06 DIAGNOSIS — Z01812 Encounter for preprocedural laboratory examination: Secondary | ICD-10-CM

## 2015-12-06 DIAGNOSIS — I495 Sick sinus syndrome: Secondary | ICD-10-CM

## 2015-12-06 NOTE — Telephone Encounter (Signed)
Called patient to offer 12/11/15 OV with Richardson Dopp.  Patient refused OV, stating he is scheduled to have a pacemaker and to "forget about it since he's had CP since he was 14 and started smoking."  Informed patient he is NOT scheduled for pacemaker placement yet. Told patient the EP RN will call him to schedule.

## 2015-12-06 NOTE — Telephone Encounter (Signed)
PPM implant scheduled for 01/16/16. Pre procedure labs on 01/09/16. Wound check on  01/27/16. Letter of instructions reviewed with patient mailed to home address.  Patient and I spent more than 30 minutes on the phone.  He is extremely nervous about this procedure and sedation. I alleviated fears as best I could.

## 2015-12-11 ENCOUNTER — Ambulatory Visit: Payer: Medicare Other | Admitting: Physician Assistant

## 2016-01-09 ENCOUNTER — Other Ambulatory Visit (INDEPENDENT_AMBULATORY_CARE_PROVIDER_SITE_OTHER): Payer: Medicare Other

## 2016-01-09 DIAGNOSIS — I495 Sick sinus syndrome: Secondary | ICD-10-CM

## 2016-01-09 DIAGNOSIS — Z01812 Encounter for preprocedural laboratory examination: Secondary | ICD-10-CM | POA: Diagnosis not present

## 2016-01-09 LAB — BASIC METABOLIC PANEL
BUN: 18 mg/dL (ref 7–25)
CALCIUM: 10 mg/dL (ref 8.6–10.3)
CO2: 24 mmol/L (ref 20–31)
CREATININE: 0.91 mg/dL (ref 0.70–1.25)
Chloride: 106 mmol/L (ref 98–110)
Glucose, Bld: 96 mg/dL (ref 65–99)
Potassium: 4.3 mmol/L (ref 3.5–5.3)
Sodium: 140 mmol/L (ref 135–146)

## 2016-01-09 LAB — CBC WITH DIFFERENTIAL/PLATELET
BASOS PCT: 0 % (ref 0–1)
Basophils Absolute: 0 10*3/uL (ref 0.0–0.1)
EOS ABS: 0.5 10*3/uL (ref 0.0–0.7)
Eosinophils Relative: 6 % — ABNORMAL HIGH (ref 0–5)
HCT: 45.8 % (ref 39.0–52.0)
Hemoglobin: 15.4 g/dL (ref 13.0–17.0)
Lymphocytes Relative: 28 % (ref 12–46)
Lymphs Abs: 2.4 10*3/uL (ref 0.7–4.0)
MCH: 31.2 pg (ref 26.0–34.0)
MCHC: 33.6 g/dL (ref 30.0–36.0)
MCV: 92.7 fL (ref 78.0–100.0)
MONOS PCT: 10 % (ref 3–12)
MPV: 11.1 fL (ref 8.6–12.4)
Monocytes Absolute: 0.9 10*3/uL (ref 0.1–1.0)
NEUTROS PCT: 56 % (ref 43–77)
Neutro Abs: 4.9 10*3/uL (ref 1.7–7.7)
PLATELETS: 265 10*3/uL (ref 150–400)
RBC: 4.94 MIL/uL (ref 4.22–5.81)
RDW: 12.7 % (ref 11.5–15.5)
WBC: 8.7 10*3/uL (ref 4.0–10.5)

## 2016-01-16 ENCOUNTER — Encounter (HOSPITAL_COMMUNITY)
Admission: RE | Disposition: A | Discharge status: Home / Self Care | Payer: Medicare Other | Source: Ambulatory Visit | Attending: Cardiology

## 2016-01-16 ENCOUNTER — Ambulatory Visit (HOSPITAL_COMMUNITY)
Admission: RE | Admit: 2016-01-16 | Discharge: 2016-01-17 | Disposition: A | Discharge status: Home / Self Care | Payer: Medicare Other | Source: Ambulatory Visit | Attending: Cardiology | Admitting: Cardiology

## 2016-01-16 ENCOUNTER — Encounter (HOSPITAL_COMMUNITY): Payer: Self-pay | Admitting: Cardiology

## 2016-01-16 DIAGNOSIS — E785 Hyperlipidemia, unspecified: Secondary | ICD-10-CM | POA: Diagnosis not present

## 2016-01-16 DIAGNOSIS — I10 Essential (primary) hypertension: Secondary | ICD-10-CM | POA: Insufficient documentation

## 2016-01-16 DIAGNOSIS — R001 Bradycardia, unspecified: Secondary | ICD-10-CM | POA: Diagnosis not present

## 2016-01-16 DIAGNOSIS — I441 Atrioventricular block, second degree: Secondary | ICD-10-CM | POA: Diagnosis not present

## 2016-01-16 DIAGNOSIS — I2510 Atherosclerotic heart disease of native coronary artery without angina pectoris: Secondary | ICD-10-CM | POA: Insufficient documentation

## 2016-01-16 DIAGNOSIS — Z95818 Presence of other cardiac implants and grafts: Secondary | ICD-10-CM

## 2016-01-16 DIAGNOSIS — E669 Obesity, unspecified: Secondary | ICD-10-CM | POA: Diagnosis not present

## 2016-01-16 DIAGNOSIS — Z87891 Personal history of nicotine dependence: Secondary | ICD-10-CM | POA: Diagnosis not present

## 2016-01-16 DIAGNOSIS — I4891 Unspecified atrial fibrillation: Secondary | ICD-10-CM | POA: Diagnosis not present

## 2016-01-16 DIAGNOSIS — I4819 Other persistent atrial fibrillation: Secondary | ICD-10-CM | POA: Diagnosis present

## 2016-01-16 DIAGNOSIS — M1990 Unspecified osteoarthritis, unspecified site: Secondary | ICD-10-CM | POA: Diagnosis not present

## 2016-01-16 DIAGNOSIS — Z6833 Body mass index (BMI) 33.0-33.9, adult: Secondary | ICD-10-CM | POA: Diagnosis not present

## 2016-01-16 DIAGNOSIS — E119 Type 2 diabetes mellitus without complications: Secondary | ICD-10-CM | POA: Diagnosis not present

## 2016-01-16 DIAGNOSIS — F419 Anxiety disorder, unspecified: Secondary | ICD-10-CM | POA: Diagnosis not present

## 2016-01-16 DIAGNOSIS — Z951 Presence of aortocoronary bypass graft: Secondary | ICD-10-CM | POA: Insufficient documentation

## 2016-01-16 DIAGNOSIS — I482 Chronic atrial fibrillation: Secondary | ICD-10-CM | POA: Insufficient documentation

## 2016-01-16 HISTORY — DX: Type 2 diabetes mellitus without complications: E11.9

## 2016-01-16 HISTORY — DX: Unspecified osteoarthritis, unspecified site: M19.90

## 2016-01-16 HISTORY — DX: Other complications of anesthesia, initial encounter: T88.59XA

## 2016-01-16 HISTORY — DX: Presence of cardiac pacemaker: Z95.0

## 2016-01-16 HISTORY — PX: INSERT / REPLACE / REMOVE PACEMAKER: SUR710

## 2016-01-16 HISTORY — DX: Major depressive disorder, single episode, unspecified: F32.9

## 2016-01-16 HISTORY — DX: Depression, unspecified: F32.A

## 2016-01-16 HISTORY — PX: EP IMPLANTABLE DEVICE: SHX172B

## 2016-01-16 HISTORY — DX: Adverse effect of unspecified anesthetic, initial encounter: T41.45XA

## 2016-01-16 LAB — GLUCOSE, CAPILLARY
GLUCOSE-CAPILLARY: 102 mg/dL — AB (ref 65–99)
GLUCOSE-CAPILLARY: 97 mg/dL (ref 65–99)
Glucose-Capillary: 95 mg/dL (ref 65–99)

## 2016-01-16 LAB — SURGICAL PCR SCREEN
MRSA, PCR: NEGATIVE
Staphylococcus aureus: NEGATIVE

## 2016-01-16 SURGERY — PACEMAKER IMPLANT
Anesthesia: LOCAL

## 2016-01-16 MED ORDER — FENTANYL CITRATE (PF) 100 MCG/2ML IJ SOLN
INTRAMUSCULAR | Status: DC | PRN
  Administered 2016-01-16 (×2): 25 ug via INTRAVENOUS

## 2016-01-16 MED ORDER — OXYCODONE HCL 5 MG PO TABS
10.0000 mg | ORAL_TABLET | ORAL | Status: DC | PRN
  Administered 2016-01-16 (×2): 10 mg via ORAL
  Filled 2016-01-16 (×2): qty 2

## 2016-01-16 MED ORDER — CHLORHEXIDINE GLUCONATE 4 % EX LIQD: 60.0000 mL | Freq: Once | CUTANEOUS | Status: DC

## 2016-01-16 MED ORDER — MUPIROCIN 2 % EX OINT
TOPICAL_OINTMENT | CUTANEOUS | Status: AC
  Administered 2016-01-16: 1 via TOPICAL
  Filled 2016-01-16: qty 22

## 2016-01-16 MED ORDER — CEFAZOLIN SODIUM 1-5 GM-% IV SOLN
1.0000 g | Freq: Four times a day (QID) | INTRAVENOUS | Status: AC
  Administered 2016-01-16 – 2016-01-17 (×3): 1 g via INTRAVENOUS
  Filled 2016-01-16 (×5): qty 50

## 2016-01-16 MED ORDER — NITROGLYCERIN 0.4 MG SL SUBL: 0.4000 mg | SUBLINGUAL_TABLET | SUBLINGUAL | Status: DC | PRN

## 2016-01-16 MED ORDER — MIDAZOLAM HCL 5 MG/5ML IJ SOLN
INTRAMUSCULAR | Status: DC | PRN
  Administered 2016-01-16 (×3): 1 mg via INTRAVENOUS

## 2016-01-16 MED ORDER — ALPRAZOLAM 0.5 MG PO TABS
1.0000 mg | ORAL_TABLET | Freq: Every day | ORAL | Status: DC
  Administered 2016-01-16: 1 mg via ORAL
  Filled 2016-01-16: qty 2

## 2016-01-16 MED ORDER — SODIUM CHLORIDE 0.9 % IV SOLN
INTRAVENOUS | Status: DC
  Administered 2016-01-16: 07:00:00 via INTRAVENOUS

## 2016-01-16 MED ORDER — FENTANYL CITRATE (PF) 100 MCG/2ML IJ SOLN
INTRAMUSCULAR | Status: AC
  Filled 2016-01-16: qty 2

## 2016-01-16 MED ORDER — LIDOCAINE HCL (PF) 1 % IJ SOLN
INTRAMUSCULAR | Status: AC
  Filled 2016-01-16: qty 60

## 2016-01-16 MED ORDER — HEPARIN (PORCINE) IN NACL 2-0.9 UNIT/ML-% IJ SOLN
INTRAMUSCULAR | Status: DC | PRN
  Administered 2016-01-16: 08:00:00

## 2016-01-16 MED ORDER — MIDAZOLAM HCL 5 MG/5ML IJ SOLN
INTRAMUSCULAR | Status: AC
  Filled 2016-01-16: qty 5

## 2016-01-16 MED ORDER — GENTAMICIN SULFATE 40 MG/ML IJ SOLN
80.0000 mg | INTRAMUSCULAR | Status: AC
  Administered 2016-01-16: 80 mg

## 2016-01-16 MED ORDER — AMLODIPINE BESYLATE 5 MG PO TABS
5.0000 mg | ORAL_TABLET | Freq: Two times a day (BID) | ORAL | Status: DC
  Administered 2016-01-16 – 2016-01-17 (×3): 5 mg via ORAL
  Filled 2016-01-16 (×4): qty 1

## 2016-01-16 MED ORDER — ONDANSETRON HCL 4 MG/2ML IJ SOLN: 4.0000 mg | Freq: Four times a day (QID) | INTRAMUSCULAR | Status: DC | PRN

## 2016-01-16 MED ORDER — ISOSORBIDE MONONITRATE ER 30 MG PO TB24
30.0000 mg | ORAL_TABLET | Freq: Every day | ORAL | Status: DC
  Administered 2016-01-16 – 2016-01-17 (×2): 30 mg via ORAL
  Filled 2016-01-16 (×2): qty 1

## 2016-01-16 MED ORDER — MUPIROCIN 2 % EX OINT
1.0000 "application " | TOPICAL_OINTMENT | Freq: Once | CUTANEOUS | Status: AC
  Administered 2016-01-16: 1 via TOPICAL

## 2016-01-16 MED ORDER — OXYCODONE HCL 10 MG PO TABS
10.0000 mg | ORAL_TABLET | Freq: Four times a day (QID) | ORAL | Status: DC | PRN
  Administered 2016-01-16: 10 mg via ORAL

## 2016-01-16 MED ORDER — CEFAZOLIN SODIUM-DEXTROSE 2-3 GM-% IV SOLR
2.0000 g | INTRAVENOUS | Status: AC
  Administered 2016-01-16: 2 g via INTRAVENOUS

## 2016-01-16 MED ORDER — LISINOPRIL 10 MG PO TABS
20.0000 mg | ORAL_TABLET | Freq: Every day | ORAL | Status: DC
  Administered 2016-01-16 – 2016-01-17 (×2): 20 mg via ORAL
  Filled 2016-01-16: qty 2
  Filled 2016-01-16: qty 1

## 2016-01-16 MED ORDER — OXYCODONE HCL 5 MG PO TABS
ORAL_TABLET | ORAL | Status: AC
Start: 2016-01-16 — End: 2016-01-16
  Filled 2016-01-16: qty 2

## 2016-01-16 MED ORDER — SODIUM CHLORIDE 0.9 % IR SOLN
Status: AC
  Filled 2016-01-16: qty 2

## 2016-01-16 MED ORDER — LIDOCAINE HCL (PF) 1 % IJ SOLN
INTRAMUSCULAR | Status: DC | PRN
  Administered 2016-01-16: 50 mL via INTRADERMAL

## 2016-01-16 MED ORDER — CEFAZOLIN SODIUM-DEXTROSE 2-3 GM-% IV SOLR
INTRAVENOUS | Status: AC
  Filled 2016-01-16: qty 50

## 2016-01-16 MED ORDER — SIMVASTATIN 20 MG PO TABS
20.0000 mg | ORAL_TABLET | Freq: Every evening | ORAL | Status: DC
  Administered 2016-01-16: 20 mg via ORAL
  Filled 2016-01-16: qty 1

## 2016-01-16 MED ORDER — ACETAMINOPHEN 325 MG PO TABS: 325.0000 mg | ORAL_TABLET | ORAL | Status: DC | PRN

## 2016-01-16 MED ORDER — HEPARIN (PORCINE) IN NACL 2-0.9 UNIT/ML-% IJ SOLN
INTRAMUSCULAR | Status: AC
  Filled 2016-01-16: qty 500

## 2016-01-16 MED ORDER — DABIGATRAN ETEXILATE MESYLATE 150 MG PO CAPS
150.0000 mg | ORAL_CAPSULE | Freq: Two times a day (BID) | ORAL | Status: DC
  Administered 2016-01-16 (×2): 150 mg via ORAL
  Filled 2016-01-16 (×4): qty 1

## 2016-01-16 SURGICAL SUPPLY — 6 items
CABLE SURGICAL S-101-97-12 (CABLE) ×1 IMPLANT
LEAD CAPSURE NOVUS 5076-58CM (Lead) ×1 IMPLANT
PAD DEFIB LIFELINK (PAD) ×1 IMPLANT
PPMADVISA SR MRI A3SR01 (Pacemaker) ×1 IMPLANT
SHEATH CLASSIC 7F (SHEATH) ×1 IMPLANT
TRAY PACEMAKER INSERTION (PACKS) ×1 IMPLANT

## 2016-01-16 NOTE — H&P (Signed)
Electrophysiology Office Note   Date:  01/16/2016   ID:  Ross [name], DOB [birth year], MRN [id number]  PCP:  Orpah Melter, MD  Cardiologist:  Radford Pax Primary Electrophysiologist:  Quintina Hakeem Meredith Leeds, MD       History of Present Illness: Ross [name] is a 60 y.o. male who presents today for electrophysiology evaluation.   He has a history of coronary artery disease status post CABG with his last 9/13 showing an occluded SVG to PDA and high-grade stenosis of the mid RCA not suitable for PCI. He also has hypertension type 2 diabetes obesity and a mildly dilated aortic root. He has chronic atrial fibrillation. He has chronic stable angina that is treated with Imdur. He does have atrial fibrillation and wore a 48-hour monitor which showed heart rates down into the 30s with symptoms. He also has pauses up to 4 seconds on his monitor. He complains of fatigue there is been going on for many years. He does have low heart rate at home down to the 30s at times.   Today, he denies symptoms of palpitations, chest pain, shortness of breath, orthopnea, PND, lower extremity edema, claudication, dizziness, presyncope, syncope, bleeding, or neurologic sequela. The patient is tolerating medications without difficulties and is otherwise without complaint today.    Past Medical History  Diagnosis Date  . Diabetes mellitus   . Hypertension   . Carpal tunnel syndrome   . Cerebral aneurysm   . Obesity   . Dyslipidemia   . Anxiety   . Peripheral neuropathy ([place])   . Coronary artery disease     a. s/p CABG;  b. Myoview (7/13):  apical anterior ischemia; c. LHC (08/2012): dLM 60%, mid LAD 90%, pCFX 40%, OM1 occluded, RCA 80-90%, distal RCA 60-70%, SVG-Dx patent, SVG-OM1 patent, LIMA-LAD patent, SVG-PDA occluded, EF 55%. => RCA dsz too long and would req multiple stents; pt refused CABG => med Rx.  Marland Kitchen Hx of echocardiogram     a. Echocardiogram (05/2013): Mild LVH, EF 54.8%, moderate LAE, mild aortic root  dilatation, trace pulmonic regurgitation  . DJD (degenerative joint disease)   . Atrial fibrillation ([facility])     rate control strategy; LA large   Past Surgical History  Procedure Laterality Date  . Left heart catheterization with coronary angiogram N/A 08/23/2012    Procedure: LEFT HEART CATHETERIZATION WITH CORONARY ANGIOGRAM;  Surgeon: Sueanne Margarita, MD;  Location: [facility];  Service: Cardiovascular;  Laterality: N/A;     Current Facility-Administered Medications  Medication Dose Route Frequency Provider Last Rate Last Dose  . 0.9 %  sodium chloride infusion   Intravenous Continuous Patsey Berthold, NP 50 mL/hr at 01/16/16 [phone]    . ceFAZolin (ANCEF) IVPB 2 g/50 mL premix  2 g Intravenous On Call Amber Sena Slate, NP      . chlorhexidine (HIBICLENS) 4 % liquid 4 application  60 mL Topical Once Amber Sena Slate, NP      . gentamicin (GARAMYCIN) 80 mg in sodium chloride irrigation 0.9 % 500 mL irrigation  80 mg Irrigation On Call Patsey Berthold, NP        Allergies:   Review of patient's allergies indicates no known allergies.   Social History:  The patient  reports that he has quit smoking. He does not have any smokeless tobacco history on file.   Family History:  The patient's family history includes Alcohol abuse (age of onset: 51) in his father; Asthma in his maternal grandmother and  mother; Colon cancer in his paternal grandfather; Colon polyps (age of onset: 32) in his mother; Drug abuse (age of onset: 39) in his sister; Heart Problems in his maternal grandfather and paternal grandmother; Other in his sister; Other (age of onset: 46) in his brother.    ROS:  Please see the history of present illness.   All other systems are reviewed and negative.    PHYSICAL EXAM: VS:  BP 142/74 mmHg  Pulse 70  Temp(Src) 98 F (36.7 C) (Oral)  Resp 18  Ht 6\' 1"  (1.854 m)  Wt 253 lb (114.76 kg)  BMI 33.39 kg/m2  SpO2 98% , BMI Body mass index is 33.39 kg/(m^2). GEN: Well nourished, well  developed, in no acute distress HEENT: normal Neck: no JVD, carotid bruits, or masses Cardiac: irregular rhythm; no murmurs, rubs, or gallops,no edema  Respiratory:  clear to auscultation bilaterally, normal work of breathing GI: soft, nontender, nondistended, + BS MS: no deformity or atrophy Skin: warm and dryhealed Neuro:  Strength and sensation are intact Psych: euthymic mood, full affect   Recent Labs: 10/24/2015: ALT 20 01/09/2016: BUN 18; Creat 0.91; Hemoglobin 15.4; Platelets 265; Potassium 4.3; Sodium 140    Lipid Panel     Component Value Date/Time   CHOL 96* 10/24/2015 1340   TRIG 120 10/24/2015 1340   HDL 39* 10/24/2015 1340   CHOLHDL 2.5 10/24/2015 1340   VLDL 24 10/24/2015 1340   LDLCALC 33 10/24/2015 1340     Wt Readings from Last 3 Encounters:  01/16/16 253 lb (114.76 kg)  11/20/15 252 lb 3.2 oz (114.397 kg)  10/17/15 253 lb (114.76 kg)      ASSESSMENT AND PLAN:  1.  Chronic atrial fibrillation: HR down to to the 30s with pauses of 4 seconds and symptoms of fatigue.  Plan for pacemaker placement today.  Taletha Twiford only need single chamber as has chronic atrial fibrillation.  Discussed risks and benefits of the procedure.  Risks include bleeding, infection, tamponade, pneumothorax.  Patient understands the risks and has agreed to pacemaker placement.   Labs/ tests ordered today include:  Orders Placed This Encounter  Procedures  . Surgical pcr screen  . Glucose, capillary  . Diet NPO time specified Except for: Sips with Meds  . Informed consent details: transcribe and obtain patient signature  . Verify informed consent  . For ICD and ICD generator change patients, confirm EKG has been done in the past 30 days.  If not place order and obtain test.  . If patient on heparin, discontinue at 0300 day of procedure  . If patient on Lovenox, discontinue at midnight the night before procedure  . Electrode Placement  . Void on call to EP Lab  . Use clippers to remove  hair, entire chest area  . Lab instructions  . If patient on AM basal insulin: AM ON DAY OF PROCEDURE:  Give 1/2 basal dose (Lantus, Levemir, or NPH)  . AM of procedure hold oral hypoglycemic agents (if ordered)  . AM of procedure hold 70/30 insulin dose (if ordered)  . SCRUB WITH chlorhexidine (HIBICLENS) 4 %  . EP PPM/ICD IMPLANT  . Insert peripheral IV X 2   Signed, Dorette Hartel Meredith Leeds, MD  01/16/2016 7:08 AM     [facility] [address] [place] [address] [phone] (office) [phone] (fax)

## 2016-01-16 NOTE — Progress Notes (Signed)
Patient refused to let me check his BP-demanded that I remove his BP cuff.

## 2016-01-16 NOTE — Discharge Instructions (Signed)
° ° °  Supplemental Discharge Instructions for  Pacemaker/Defibrillator Patients  Activity No heavy lifting or vigorous activity with your left/right arm for 6 to 8 weeks.  Do not raise your left/right arm above your head for one week.  Gradually raise your affected arm as drawn below.           __      01/20/16                01/21/16                      01/22/16                       01/23/16  NO DRIVING for  1 week   ; you may begin driving on  [id number]   .  WOUND CARE - Keep the wound area clean and dry.  Do not get this area wet for one week. No showers for one week; you may shower on 01/23/16    . - The tape/steri-strips on your wound will fall off; do not pull them off.  No bandage is needed on the site.  DO  NOT apply any creams, oils, or ointments to the wound area. - If you notice any drainage or discharge from the wound, any swelling or bruising at the site, or you develop a fever > 101? F after you are discharged home, call the office at once.  Special Instructions - You are still able to use cellular telephones; use the ear opposite the side where you have your pacemaker/defibrillator.  Avoid carrying your cellular phone near your device. - When traveling through airports, show security personnel your identification card to avoid being screened in the metal detectors.  Ask the security personnel to use the hand wand. - Avoid arc welding equipment, TENS units (transcutaneous nerve stimulators).  Call the office for questions about other devices. - Avoid electrical appliances that are in poor condition or are not properly grounded. - Microwave ovens are safe to be near or to operate.

## 2016-01-16 NOTE — Progress Notes (Signed)
Received patient in room 2W33 from  Cath lab @ 2:30pm     Pacemaker on Left, drsg clean dry & intact, walked in BR  Voided a  Large amt, wife with patient,  Slightly anxious at this time, complains of pain all over, right shoulder, knees, hip, Requesting something to drink.  Placed on Tele box 3w34  York Spaniel

## 2016-01-16 NOTE — Progress Notes (Signed)
Eating breakfast 

## 2016-01-16 NOTE — Discharge Summary (Signed)
ELECTROPHYSIOLOGY PROCEDURE DISCHARGE SUMMARY    Patient ID: Ross [name],  MRN: [id number], DOB/AGE: 1956-09-05 60 y.o.  Admit date: 01/16/2016 Discharge date: 01/17/2016  Primary Care Physician: Orpah Melter, MD Primary Cardiologist: Radford Pax Electrophysiologist: Curt Bears  Primary Discharge Diagnosis:  Symptomatic bradycardia status post pacemaker implantation this admission  Secondary Discharge Diagnosis:  1.  Permanent atrial fibrillation 2.  CAD s/p CABG 3.  Diabetes 4.  Hypertension 5.  Obesity 6.  Anxiety  No Known Allergies   Procedures This Admission:  1.  Implantation of a MDT single chamber PPM on 01/16/16 by Dr Curt Bears.  The patient received a MDT model number Advisa PPM with model number 5076 right ventricular lead. There were no immediate post procedure complications. 2.  CXR on 01/17/16 demonstrated no pneumothorax status post device implantation.   Brief HPI: Ross [name] is a 60 y.o. male was referred to electrophysiology in the outpatient setting for consideration of PPM implantation.  Past medical history includes permanent atrial fibrillation with pauses of up to 4 seconds, CAD.  The patient has had symptomatic bradycardia without reversible causes identified.  Risks, benefits, and alternatives to PPM implantation were reviewed with the patient who wished to proceed.   Hospital Course:  The patient was admitted and underwent implantation of a MDT single chamber pacemaker with details as outlined above.  He was monitored on telemetry overnight which demonstrated atrial fibrillation with ventricular pacing.  Left chest was without hematoma or ecchymosis.  The device was interrogated and found to be functioning normally.  CXR was obtained and demonstrated no pneumothorax status post device implantation.  Wound care, arm mobility, and restrictions were reviewed with the patient.  The patient was examined by Dr Curt Bears and considered stable for discharge to  home.    Physical Exam: Filed Vitals:   01/16/16 1210 01/16/16 1445 01/16/16 2020 01/17/16 0517  BP: 119/65 133/73 113/67 133/76  Pulse: 59 59 60 60  Temp:   98.4 F (36.9 C) 98.1 F (36.7 C)  TempSrc:   Oral Oral  Resp: 19 18 18 18   Height:      Weight:      SpO2: 93% 94% 95% 94%    Labs:   Lab Results  Component Value Date   WBC 8.7 01/09/2016   HGB 15.4 01/09/2016   HCT 45.8 01/09/2016   MCV 92.7 01/09/2016   PLT 265 01/09/2016   No results for input(s): NA, K, CL, CO2, BUN, CREATININE, CALCIUM, PROT, BILITOT, ALKPHOS, ALT, AST, GLUCOSE in the last 168 hours.  Invalid input(s): LABALBU  Discharge Medications:    Medication List    TAKE these medications        ALPRAZolam 1 MG tablet  Commonly known as:  XANAX  Take 1 mg by mouth at bedtime. For anxiety     amLODipine 5 MG tablet  Commonly known as:  NORVASC  Take 5 mg by mouth 2 (two) times daily.     calcium-vitamin D 500-200 MG-UNIT tablet  Commonly known as:  OSCAL WITH D  Take 1 tablet by mouth daily.     cholecalciferol 1000 units tablet  Commonly known as:  VITAMIN D  Take 1,000 Units by mouth daily.     dabigatran 150 MG Caps capsule  Commonly known as:  PRADAXA  Take 1 capsule (150 mg total) by mouth every 12 (twelve) hours.     isosorbide mononitrate 30 MG 24 hr tablet  Commonly known as:  IMDUR  Take  1 tablet by mouth two  times daily     lisinopril 20 MG tablet  Commonly known as:  PRINIVIL,ZESTRIL  Take 20 mg by mouth daily.     multivitamin with minerals Tabs tablet  Take 1 tablet by mouth daily.     nitroGLYCERIN 0.4 MG SL tablet  Commonly known as:  NITROSTAT  Place 1 tablet (0.4 mg total) under the tongue every 5 (five) minutes as needed for chest pain (up to 3 doses).     Oxycodone HCl 10 MG Tabs  Take 1 tablet by mouth 4 (four) times daily as needed (pain).     simvastatin 20 MG tablet  Commonly known as:  ZOCOR  Take 20 mg by mouth every evening.         Disposition:  Discharge Instructions    Diet - low sodium heart healthy    Complete by:  As directed      Increase activity slowly    Complete by:  As directed           Follow-up Information    Follow up with [facility] On 01/27/2016.   Specialty:  Cardiology   Why:  at [facility] for wound check    Contact information:   [address], [place] [phone]      Follow up with Lindzy Rupert Meredith Leeds, MD On 04/20/2016.   Specialty:  Cardiology   Why:  at 11:15AM   Contact information:   [place] [address] [phone]       Duration of Discharge Encounter: Greater than 30 minutes including physician time.  Signed, Chanetta Marshall, NP 01/17/2016 8:29 AM   I have seen and examined this patient with Chanetta Marshall.  Agree with above, note added to reflect my findings.  On exam, regular rhythm, no murmurs, lungs clear.  Had single chamber pacemaker placed yesterday for second degree AV block and atrial fibrillation with fatigue.  CXR without abnormality, interrogation shows stable lead parameters.  Plan for follow up in EP clinic in 10 days.    Airik Goodlin M. Hodge Stachnik MD 01/17/2016 1:19 PM

## 2016-01-16 NOTE — Progress Notes (Signed)
Family in to see. Ambulated to BR. Breakfast plate ordered.

## 2016-01-17 ENCOUNTER — Ambulatory Visit (HOSPITAL_COMMUNITY): Payer: Medicare Other

## 2016-01-17 DIAGNOSIS — E119 Type 2 diabetes mellitus without complications: Secondary | ICD-10-CM | POA: Diagnosis not present

## 2016-01-17 DIAGNOSIS — I2510 Atherosclerotic heart disease of native coronary artery without angina pectoris: Secondary | ICD-10-CM | POA: Diagnosis not present

## 2016-01-17 DIAGNOSIS — R001 Bradycardia, unspecified: Secondary | ICD-10-CM | POA: Diagnosis not present

## 2016-01-17 DIAGNOSIS — I4891 Unspecified atrial fibrillation: Secondary | ICD-10-CM | POA: Diagnosis not present

## 2016-01-17 DIAGNOSIS — I482 Chronic atrial fibrillation: Secondary | ICD-10-CM | POA: Diagnosis not present

## 2016-01-17 DIAGNOSIS — I441 Atrioventricular block, second degree: Secondary | ICD-10-CM | POA: Diagnosis not present

## 2016-01-17 MED ORDER — ALPRAZOLAM 0.5 MG PO TABS
0.5000 mg | ORAL_TABLET | Freq: Once | ORAL | Status: AC
  Administered 2016-01-17: 0.5 mg via ORAL
  Filled 2016-01-17: qty 1

## 2016-01-17 MED FILL — Gentamicin Sulfate Inj 40 MG/ML: INTRAMUSCULAR | Qty: 2 | Status: AC

## 2016-01-17 MED FILL — Sodium Chloride Irrigation Soln 0.9%: Qty: 500 | Status: AC

## 2016-01-17 NOTE — Progress Notes (Signed)
Patient and family educated on upcoming appointments and care for new pacemaker. No questions or concerns at this time.

## 2016-01-27 ENCOUNTER — Ambulatory Visit (INDEPENDENT_AMBULATORY_CARE_PROVIDER_SITE_OTHER): Payer: Medicare Other | Admitting: *Deleted

## 2016-01-27 ENCOUNTER — Encounter: Payer: Self-pay | Admitting: Cardiology

## 2016-01-27 DIAGNOSIS — Z950 Presence of cardiac pacemaker: Secondary | ICD-10-CM | POA: Diagnosis not present

## 2016-01-27 DIAGNOSIS — I495 Sick sinus syndrome: Secondary | ICD-10-CM | POA: Diagnosis not present

## 2016-01-27 LAB — CUP PACEART INCLINIC DEVICE CHECK
Battery Voltage: 3.12 V
Implantable Lead Location: 753860
Implantable Lead Model: 5076
Lead Channel Impedance Value: 608 Ohm
Lead Channel Impedance Value: 684 Ohm
Lead Channel Pacing Threshold Amplitude: 0.5 V
Lead Channel Pacing Threshold Pulse Width: 0.4 ms
Lead Channel Sensing Intrinsic Amplitude: 15.25 mV
Lead Channel Setting Pacing Amplitude: 3.5 V
MDC IDC LEAD IMPLANT DT: 20170126
MDC IDC SESS DTM: 20170206170141
MDC IDC SET LEADCHNL RV PACING PULSEWIDTH: 0.4 ms
MDC IDC SET LEADCHNL RV SENSING SENSITIVITY: 2 mV
MDC IDC STAT BRADY RV PERCENT PACED: 77.51 %

## 2016-01-27 NOTE — Progress Notes (Signed)
Wound check appointment. Steri-strips removed. Wound without redness or edema. Noted pinhole-sized scabbed area of left lateral incision, incision edges otherwise approximated. Per GT's recommendation, patient to wash site with soapy water daily and continue to monitor for healing progress. Patient aware to call with signs/symptoms of infection. Normal device function. Thresholds, sensing, and impedances consistent with implant measurements. Device programmed at 3.5V/auto capture programmed on for extra safety margin until 3 month visit. Histogram distribution appropriate for patient and level of activity. No high ventricular rates noted. Patient educated about wound care, arm mobility, lifting restrictions. ROV with device clinic on 02/03/16 for wound recheck and ROV in 3 months with WC.

## 2016-02-03 ENCOUNTER — Ambulatory Visit (INDEPENDENT_AMBULATORY_CARE_PROVIDER_SITE_OTHER): Payer: Medicare Other | Admitting: *Deleted

## 2016-02-03 DIAGNOSIS — Z950 Presence of cardiac pacemaker: Secondary | ICD-10-CM

## 2016-02-03 NOTE — Progress Notes (Signed)
Mr. Cords presents for a wound recheck. Scabbed area noted on left lateral edge of incision- WC evaluated site. No intervention necessary, no re-check needed. Patient instructed about incision care and activity restrictions once more. ROV with WC 04/20/16.

## 2016-02-05 ENCOUNTER — Ambulatory Visit: Payer: Medicare Other

## 2016-03-09 ENCOUNTER — Telehealth: Payer: Self-pay | Admitting: Cardiology

## 2016-03-09 NOTE — Telephone Encounter (Addendum)
Returned patient's call.  Patient states that the scab has "gotten a little bigger" and is a "little red".  He otherwise denies redness, tenderness, swelling, or drainage from incision site.  He also denies fever or chills.  Patient is agreeable to an appointment tomorrow, 03/10/16 at 4:00pm with Dr. Curt Bears for a wound recheck.  He denies any additional questions or concerns at this time.

## 2016-03-09 NOTE — Telephone Encounter (Signed)
Ross [name] is calling because he still has a scab on the area where the pacer maker was placed . Worried that the scab should have been cleared up . Please call    Thanks

## 2016-03-10 ENCOUNTER — Encounter: Payer: Self-pay | Admitting: Cardiology

## 2016-03-10 ENCOUNTER — Ambulatory Visit (INDEPENDENT_AMBULATORY_CARE_PROVIDER_SITE_OTHER): Payer: Medicare Other | Admitting: Cardiology

## 2016-03-10 VITALS — BP 126/68 | Ht 74.0 in | Wt 247.8 lb

## 2016-03-10 DIAGNOSIS — I482 Chronic atrial fibrillation: Secondary | ICD-10-CM | POA: Diagnosis not present

## 2016-03-10 DIAGNOSIS — Z950 Presence of cardiac pacemaker: Secondary | ICD-10-CM | POA: Diagnosis not present
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
## 2016-03-10 NOTE — Patient Instructions (Addendum)
Medication Instructions:  Your physician recommends that you continue on your current medications as directed. Please refer to the Current Medication list given to you today.  Labwork: None ordered  Testing/Procedures: None ordered  Follow-Up: Your physician recommends that you schedule a follow-up appointment in: 1 month with Dr Curt Bears.   If you need a refill on your cardiac medications before your next appointment, please call your pharmacy.  Thank you for choosing [facility]!!   Trinidad Curet, RN [phone]  Any Other Special Instructions Will Be Listed Below (If Applicable).

## 2016-03-12 LAB — CUP PACEART INCLINIC DEVICE CHECK
Battery Voltage: 3.08 V
Brady Statistic RV Percent Paced: 68.75 %
Implantable Lead Implant Date: 20170126
Lead Channel Impedance Value: 722 Ohm
Lead Channel Sensing Intrinsic Amplitude: 13.125 mV
Lead Channel Sensing Intrinsic Amplitude: 13.25 mV
Lead Channel Setting Pacing Amplitude: 3.5 V
Lead Channel Setting Pacing Pulse Width: 0.4 ms
MDC IDC LEAD LOCATION: 753860
MDC IDC MSMT BATTERY REMAINING LONGEVITY: 158 mo
MDC IDC MSMT LEADCHNL RV IMPEDANCE VALUE: 817 Ohm
MDC IDC MSMT LEADCHNL RV PACING THRESHOLD AMPLITUDE: 0.5 V
MDC IDC MSMT LEADCHNL RV PACING THRESHOLD PULSEWIDTH: 0.4 ms
MDC IDC SESS DTM: 20170321201756
MDC IDC SET LEADCHNL RV SENSING SENSITIVITY: 2 mV

## 2016-04-10 ENCOUNTER — Encounter: Payer: Medicare Other | Admitting: Cardiology

## 2016-04-20 ENCOUNTER — Ambulatory Visit (INDEPENDENT_AMBULATORY_CARE_PROVIDER_SITE_OTHER): Payer: Medicare Other | Admitting: Cardiology

## 2016-04-20 ENCOUNTER — Encounter: Payer: Self-pay | Admitting: Cardiology

## 2016-04-20 VITALS — BP 112/70 | HR 66 | Ht 75.0 in | Wt 249.2 lb

## 2016-04-20 DIAGNOSIS — Z950 Presence of cardiac pacemaker: Secondary | ICD-10-CM | POA: Diagnosis not present

## 2016-04-20 DIAGNOSIS — I495 Sick sinus syndrome: Secondary | ICD-10-CM

## 2016-04-20 LAB — CUP PACEART INCLINIC DEVICE CHECK
Battery Remaining Longevity: 134 mo
Battery Voltage: 3.06 V
Brady Statistic RV Percent Paced: 85.72 %
Implantable Lead Implant Date: 20170126
Implantable Lead Location: 753860
Lead Channel Impedance Value: 779 Ohm
Lead Channel Pacing Threshold Amplitude: 0.5 V
Lead Channel Setting Pacing Amplitude: 2 V
Lead Channel Setting Pacing Pulse Width: 0.4 ms
Lead Channel Setting Sensing Sensitivity: 2 mV
MDC IDC MSMT LEADCHNL RV IMPEDANCE VALUE: 684 Ohm
MDC IDC MSMT LEADCHNL RV PACING THRESHOLD PULSEWIDTH: 0.4 ms
MDC IDC MSMT LEADCHNL RV SENSING INTR AMPL: 14.75 mV
MDC IDC SESS DTM: 20170501154055
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
## 2016-04-20 NOTE — Patient Instructions (Addendum)
Medication Instructions:  Your physician recommends that you continue on your current medications as directed. Please refer to the Current Medication list given to you today.  Labwork: None  ordered  Testing/Procedures: None ordered  Follow-Up: Remote monitoring is used to monitor your Pacemaker of ICD from home. This monitoring reduces the number of office visits required to check your device to one time per year. It allows [place] to keep an eye on the functioning of your device to ensure it is working properly. You are scheduled for a device check from home on 07/20/16. You may send your transmission at any time that day. If you have a wireless device, the transmission will be sent automatically. After your physician reviews your transmission, you will receive a postcard with your next transmission date.  Your physician wants you to follow-up in: 9 months with Dr. Curt Bears.  You will receive a reminder letter in the mail two months in advance. If you don't receive a letter, please call our office to schedule the follow-up appointment.   Any Other Special Instructions Will Be Listed Below (If Applicable).  If you need a refill on your cardiac medications before your next appointment, please call your pharmacy.  Thank you for choosing [facility]!!   Trinidad Curet, RN [phone]

## 2016-07-20 ENCOUNTER — Encounter: Payer: Medicare Other | Admitting: *Deleted

## 2016-07-20 ENCOUNTER — Telehealth: Payer: Self-pay | Admitting: Cardiology

## 2016-07-20 NOTE — Telephone Encounter (Signed)
Spoke w/ pt and informed him that he can call me when he gets home from the beach and I will help him send the remote transmission. Pt verbalized understanding.

## 2016-07-20 NOTE — Telephone Encounter (Signed)
New message   Pt returning RN call about device. Pt informed me he is at the beach. Please call back to device

## 2016-07-20 NOTE — Telephone Encounter (Signed)
LMOVM reminding pt to send remote transmission.   

## 2016-07-24 ENCOUNTER — Encounter: Payer: Self-pay | Admitting: Cardiology

## 2016-07-29 ENCOUNTER — Ambulatory Visit (INDEPENDENT_AMBULATORY_CARE_PROVIDER_SITE_OTHER): Payer: Medicare Other | Admitting: *Deleted

## 2016-07-29 DIAGNOSIS — I495 Sick sinus syndrome: Secondary | ICD-10-CM

## 2016-07-29 DIAGNOSIS — Z950 Presence of cardiac pacemaker: Secondary | ICD-10-CM

## 2016-07-30 ENCOUNTER — Encounter: Payer: Self-pay | Admitting: Cardiology

## 2016-07-30 NOTE — Progress Notes (Signed)
Remote pacemaker transmission.   

## 2016-08-03 LAB — CUP PACEART REMOTE DEVICE CHECK
Battery Remaining Longevity: 125 mo
Implantable Lead Implant Date: 20170126
Implantable Lead Location: 753860
Implantable Lead Model: 5076
Lead Channel Impedance Value: 779 Ohm
Lead Channel Pacing Threshold Amplitude: 0.375 V
Lead Channel Pacing Threshold Pulse Width: 0.4 ms
Lead Channel Sensing Intrinsic Amplitude: 12.25 mV
Lead Channel Setting Sensing Sensitivity: 2 mV
MDC IDC MSMT BATTERY VOLTAGE: 3.04 V
MDC IDC MSMT LEADCHNL RV IMPEDANCE VALUE: 703 Ohm
MDC IDC MSMT LEADCHNL RV SENSING INTR AMPL: 12.25 mV
MDC IDC SESS DTM: 20170809201516
MDC IDC SET LEADCHNL RV PACING AMPLITUDE: 2 V
MDC IDC SET LEADCHNL RV PACING PULSEWIDTH: 0.4 ms
MDC IDC STAT BRADY RV PERCENT PACED: 83.18 %

## 2016-10-22 ENCOUNTER — Other Ambulatory Visit (HOSPITAL_COMMUNITY): Payer: Medicare Other

## 2016-10-23 ENCOUNTER — Ambulatory Visit (HOSPITAL_COMMUNITY): Payer: Medicare Other

## 2016-10-29 ENCOUNTER — Telehealth: Payer: Self-pay | Admitting: Cardiology

## 2016-10-29 ENCOUNTER — Ambulatory Visit (INDEPENDENT_AMBULATORY_CARE_PROVIDER_SITE_OTHER): Payer: Medicare Other | Admitting: *Deleted

## 2016-10-29 DIAGNOSIS — I495 Sick sinus syndrome: Secondary | ICD-10-CM

## 2016-10-29 NOTE — Telephone Encounter (Signed)
Spoke with pt and reminded pt of remote transmission that is due today. Pt verbalized understanding.   

## 2016-10-29 NOTE — Progress Notes (Signed)
Remote pacemaker transmission.   

## 2016-10-30 ENCOUNTER — Encounter: Payer: Self-pay | Admitting: Cardiology

## 2016-11-03 ENCOUNTER — Other Ambulatory Visit: Payer: Self-pay | Admitting: Physician Assistant

## 2016-11-03 DIAGNOSIS — Z122 Encounter for screening for malignant neoplasm of respiratory organs: Secondary | ICD-10-CM

## 2016-11-20 ENCOUNTER — Ambulatory Visit
Admission: RE | Admit: 2016-11-20 | Discharge: 2016-11-20 | Disposition: A | Discharge status: Home / Self Care | Payer: Medicare Other | Source: Ambulatory Visit | Attending: Physician Assistant | Admitting: Physician Assistant

## 2016-11-20 DIAGNOSIS — Z122 Encounter for screening for malignant neoplasm of respiratory organs: Secondary | ICD-10-CM

## 2016-11-25 LAB — CUP PACEART REMOTE DEVICE CHECK
Battery Voltage: 3.03 V
Implantable Lead Implant Date: 20170126
Implantable Lead Location: 753860
Implantable Pulse Generator Implant Date: 20170126
Lead Channel Pacing Threshold Amplitude: 0.5 V
Lead Channel Pacing Threshold Pulse Width: 0.4 ms
Lead Channel Sensing Intrinsic Amplitude: 14.25 mV
Lead Channel Setting Pacing Pulse Width: 0.4 ms
MDC IDC MSMT BATTERY REMAINING LONGEVITY: 121 mo
MDC IDC MSMT LEADCHNL RV IMPEDANCE VALUE: 589 Ohm
MDC IDC MSMT LEADCHNL RV IMPEDANCE VALUE: 627 Ohm
MDC IDC MSMT LEADCHNL RV SENSING INTR AMPL: 14.25 mV
MDC IDC SESS DTM: 20171109172005
MDC IDC SET LEADCHNL RV PACING AMPLITUDE: 2 V
MDC IDC SET LEADCHNL RV SENSING SENSITIVITY: 2 mV
MDC IDC STAT BRADY RV PERCENT PACED: 89.28 %

## 2017-01-08 ENCOUNTER — Ambulatory Visit (HOSPITAL_COMMUNITY): Payer: Medicare Other | Attending: Cardiovascular Disease

## 2017-01-08 ENCOUNTER — Other Ambulatory Visit: Payer: Self-pay

## 2017-01-08 DIAGNOSIS — I77810 Thoracic aortic ectasia: Secondary | ICD-10-CM | POA: Insufficient documentation

## 2017-01-08 MED ORDER — PERFLUTREN LIPID MICROSPHERE
1.0000 mL | INTRAVENOUS | Status: AC | PRN
  Administered 2017-01-08: 3 mL via INTRAVENOUS

## 2017-01-11 ENCOUNTER — Telehealth: Payer: Self-pay

## 2017-01-11 DIAGNOSIS — I5189 Other ill-defined heart diseases: Secondary | ICD-10-CM

## 2017-01-11 DIAGNOSIS — I77810 Thoracic aortic ectasia: Secondary | ICD-10-CM

## 2017-01-11 NOTE — Telephone Encounter (Signed)
Informed patient of results and verbal understanding expressed.  Lexiscan ordered for scheduling. Patient understands to fast for 2 hours prior to test, avoid caffeine, decaf, and smoking for 12 hours prior to the test, and to wear comfortable clothes and close-toed shoes. Repeat ECHO ordered to be scheduled in 1 year.  Patient agrees with treatment plan.

## 2017-01-11 NOTE — Telephone Encounter (Signed)
-----   Message from Sueanne Margarita, MD sent at 01/08/2017  4:37 PM EST ----- LVF has declined since last assessment.  Please set up for lexiscan myoview to rule out ischemia

## 2017-01-14 ENCOUNTER — Telehealth (HOSPITAL_COMMUNITY): Payer: Self-pay | Admitting: Cardiology

## 2017-01-18 ENCOUNTER — Telehealth: Payer: Self-pay | Admitting: Cardiology

## 2017-01-18 NOTE — Telephone Encounter (Signed)
Follow Up   Pt returning phone call. Requesting call back.

## 2017-01-18 NOTE — Telephone Encounter (Signed)
01/14/17 Left Message - Called pt and lmsg for him to CB    By Verdene Rio

## 2017-01-22 ENCOUNTER — Encounter (INDEPENDENT_AMBULATORY_CARE_PROVIDER_SITE_OTHER): Payer: Self-pay

## 2017-01-22 ENCOUNTER — Encounter: Payer: Self-pay | Admitting: Cardiology

## 2017-01-22 ENCOUNTER — Ambulatory Visit (INDEPENDENT_AMBULATORY_CARE_PROVIDER_SITE_OTHER): Payer: Medicare Other | Admitting: Cardiology

## 2017-01-22 VITALS — BP 110/60 | HR 79 | Ht 72.5 in | Wt 263.6 lb

## 2017-01-22 DIAGNOSIS — I482 Chronic atrial fibrillation, unspecified: Secondary | ICD-10-CM

## 2017-01-22 DIAGNOSIS — Z79899 Other long term (current) drug therapy: Secondary | ICD-10-CM

## 2017-01-22 LAB — CUP PACEART INCLINIC DEVICE CHECK
Battery Remaining Longevity: 114 mo
Brady Statistic RV Percent Paced: 86.54 %
Date Time Interrogation Session: 20180202170543
Implantable Lead Location: 753860
Implantable Lead Model: 5076
Lead Channel Impedance Value: 608 Ohm
Lead Channel Setting Pacing Amplitude: 2 V
Lead Channel Setting Pacing Pulse Width: 0.4 ms
Lead Channel Setting Sensing Sensitivity: 2 mV
MDC IDC LEAD IMPLANT DT: 20170126
MDC IDC MSMT BATTERY VOLTAGE: 3.02 V
MDC IDC MSMT LEADCHNL RV IMPEDANCE VALUE: 551 Ohm
MDC IDC MSMT LEADCHNL RV PACING THRESHOLD AMPLITUDE: 0.5 V
MDC IDC MSMT LEADCHNL RV PACING THRESHOLD PULSEWIDTH: 0.4 ms
MDC IDC MSMT LEADCHNL RV SENSING INTR AMPL: 10.375 mV
MDC IDC MSMT LEADCHNL RV SENSING INTR AMPL: 11 mV
MDC IDC PG IMPLANT DT: 20170126

## 2017-01-22 MED ORDER — LISINOPRIL 40 MG PO TABS: 40.0000 mg | ORAL_TABLET | Freq: Every day | ORAL | 0 refills | Status: DC

## 2017-01-22 MED ORDER — CARVEDILOL 6.25 MG PO TABS: 6.2500 mg | ORAL_TABLET | Freq: Two times a day (BID) | ORAL | 0 refills | Status: DC

## 2017-01-22 MED ORDER — LISINOPRIL 40 MG PO TABS: 40.0000 mg | ORAL_TABLET | Freq: Every day | ORAL | 3 refills | Status: DC

## 2017-01-22 MED ORDER — CARVEDILOL 6.25 MG PO TABS: 6.2500 mg | ORAL_TABLET | Freq: Two times a day (BID) | ORAL | 11 refills | Status: DC

## 2017-01-22 MED ORDER — LISINOPRIL 40 MG PO TABS: 40.0000 mg | ORAL_TABLET | Freq: Every day | ORAL | 11 refills | Status: DC

## 2017-01-22 MED ORDER — CARVEDILOL 6.25 MG PO TABS: 6.2500 mg | ORAL_TABLET | Freq: Two times a day (BID) | ORAL | 3 refills | Status: DC

## 2017-01-22 NOTE — Addendum Note (Signed)
Addended by: Stanton Kidney on: 01/22/2017 05:19 PM   Modules accepted: Orders

## 2017-01-22 NOTE — Addendum Note (Signed)
Addended by: Stanton Kidney on: 01/22/2017 05:27 PM   Modules accepted: Orders
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
## 2017-01-22 NOTE — Patient Instructions (Signed)
Medication Instructions:    Your physician has recommended you make the following change in your medication: 1) INCREASE Lisinopril to 40 mg once daily 2) START Carvedilol 6.25 mg twice a day  --- If you need a refill on your cardiac medications before your next appointment, please call your pharmacy. ---  Labwork:  BMET today  Testing/Procedures:  None ordered  Follow-Up:  Your physician wants you to follow-up in: 6 months with Dr. Curt Bears.  You will receive a reminder letter in the mail two months in advance. If you don't receive a letter, please call our office to schedule the follow-up appointment.  Thank you for choosing [facility]!!   Trinidad Curet, RN [phone]  Any Other Special Instructions Will Be Listed Below (If Applicable).  Carvedilol tablets What is this medicine? CARVEDILOL (KAR ve dil ol) is a beta-blocker. Beta-blockers reduce the workload on the heart and help it to beat more regularly. This medicine is used to treat high blood pressure and heart failure. This medicine may be used for other purposes; ask your health care provider or pharmacist if you have questions. COMMON BRAND NAME(S): Coreg What should I tell my health care provider before I take this medicine? They need to know if you have any of these conditions: -circulation problems -diabetes -history of heart attack or heart disease -liver disease -lung or breathing disease, like asthma or emphysema -pheochromocytoma -slow or irregular heartbeat -thyroid disease -an unusual or allergic reaction to carvedilol, other beta-blockers, medicines, foods, dyes, or preservatives -pregnant or trying to get pregnant -breast-feeding How should I use this medicine? Take this medicine by mouth with a glass of water. Follow the directions on the prescription label. It is best to take the tablets with food. Take your doses at regular intervals. Do not take your medicine more often than directed. Do  not stop taking except on the advice of your doctor or health care professional. Talk to your pediatrician regarding the use of this medicine in children. Special care may be needed. Overdosage: If you think you have taken too much of this medicine contact a poison control center or emergency room at once. NOTE: This medicine is only for you. Do not share this medicine with others. What if I miss a dose? If you miss a dose, take it as soon as you can. If it is almost time for your next dose, take only that dose. Do not take double or extra doses. What may interact with this medicine? This medicine may interact with the following medications: -certain medicines for blood pressure, heart disease, irregular heart beat -certain medicines for depression, like fluoxetine or paroxetine -certain medicines for diabetes, like glipizide or glyburide -cimetidine -clonidine -cyclosporine -digoxin -MAOIs like Carbex, Eldepryl, Marplan, Nardil, and Parnate -reserpine -rifampin This list may not describe all possible interactions. Give your health care provider a list of all the medicines, herbs, non-prescription drugs, or dietary supplements you use. Also tell them if you smoke, drink alcohol, or use illegal drugs. Some items may interact with your medicine. What should I watch for while using this medicine? Check your heart rate and blood pressure regularly while you are taking this medicine. Ask your doctor or health care professional what your heart rate and blood pressure should be, and when you should contact him or her. Do not stop taking this medicine suddenly. This could lead to serious heart-related effects. Contact your doctor or health care professional if you have difficulty breathing while taking this drug. Check  your weight daily. Ask your doctor or health care professional when you should notify him/her of any weight gain. You may get drowsy or dizzy. Do not drive, use machinery, or do  anything that requires mental alertness until you know how this medicine affects you. To reduce the risk of dizzy or fainting spells, do not sit or stand up quickly. Alcohol can make you more drowsy, and increase flushing and rapid heartbeats. Avoid alcoholic drinks. If you have diabetes, check your blood sugar as directed. Tell your doctor if you have changes in your blood sugar while you are taking this medicine. If you are going to have surgery, tell your doctor or health care professional that you are taking this medicine. What side effects may I notice from receiving this medicine? Side effects that you should report to your doctor or health care professional as soon as possible: -allergic reactions like skin rash, itching or hives, swelling of the face, lips, or tongue -breathing problems -dark urine -irregular heartbeat -swollen legs or ankles -vomiting -yellowing of the eyes or skin Side effects that usually do not require medical attention (report to your doctor or health care professional if they continue or are bothersome): -change in sex drive or performance -diarrhea -dry eyes (especially if wearing contact lenses) -dry, itching skin -headache -nausea -unusually tired This list may not describe all possible side effects. Call your doctor for medical advice about side effects. You may report side effects to FDA at 1-800-FDA-1088. Where should I keep my medicine? Keep out of the reach of children. Store at room temperature below 30 degrees C (86 degrees F). Protect from moisture. Keep container tightly closed. Throw away any unused medicine after the expiration date. NOTE: This sheet is a summary. It may not cover all possible information. If you have questions about this medicine, talk to your doctor, pharmacist, or health care provider.  2017 Elsevier/Gold Standard (2013-08-13 14:12:02)

## 2017-01-23 LAB — BASIC METABOLIC PANEL
BUN/Creatinine Ratio: 14 (ref 10–24)
BUN: 17 mg/dL (ref 8–27)
CALCIUM: 11 mg/dL — AB (ref 8.6–10.2)
CO2: 23 mmol/L (ref 18–29)
CREATININE: 1.18 mg/dL (ref 0.76–1.27)
Chloride: 101 mmol/L (ref 96–106)
GFR calc Af Amer: 76 mL/min/{1.73_m2} (ref >59)
GFR, EST NON AFRICAN AMERICAN: 66 mL/min/{1.73_m2} (ref >59)
Glucose: 99 mg/dL (ref 65–99)
Potassium: 4.5 mmol/L (ref 3.5–5.2)
Sodium: 141 mmol/L (ref 134–144)

## 2017-01-25 ENCOUNTER — Other Ambulatory Visit: Payer: Self-pay | Admitting: *Deleted

## 2017-01-25 MED ORDER — LISINOPRIL 40 MG PO TABS: 40.0000 mg | ORAL_TABLET | Freq: Every day | ORAL | 3 refills | Status: DC

## 2017-01-25 MED ORDER — CARVEDILOL 6.25 MG PO TABS: 6.2500 mg | ORAL_TABLET | Freq: Two times a day (BID) | ORAL | 0 refills | Status: DC

## 2017-01-25 MED ORDER — CARVEDILOL 6.25 MG PO TABS: 6.2500 mg | ORAL_TABLET | Freq: Two times a day (BID) | ORAL | 3 refills | Status: DC

## 2017-01-25 NOTE — Telephone Encounter (Signed)
I have already spoke with this patient and he stated that his rx's were supposed to be sent to [facility] at his office visit on 01/22/17. I made him aware that they were sent to his mail order pharmacy. He stated that he was instructed to start the carvedilol right away, but he has enough lisinopril to last until his mail order arrives. He requested a short term rx of the carvedilol be sent to [facility] which I have already done.

## 2017-01-25 NOTE — Addendum Note (Signed)
Addended by: Stanton Kidney on: 01/25/2017 01:25 PM   Modules accepted: Orders

## 2017-01-25 NOTE — Telephone Encounter (Signed)
°*  STAT* If patient is at the pharmacy, call can be transferred to refill team.   1. Which medications need to be refilled? (please list name of each medication and dose if known) Carvedilol, 6.25mg  and Lisinopril, 40mg   2. Which pharmacy/location (including street and city if local pharmacy) is medication to be sent to? [facility] [place], [place] [employer]  3. Do they need a 30 day or 90 day supply? Patient not sure which one

## 2017-01-29 ENCOUNTER — Encounter (HOSPITAL_COMMUNITY): Payer: Medicare Other

## 2017-02-25 ENCOUNTER — Telehealth (HOSPITAL_COMMUNITY): Payer: Self-pay | Admitting: *Deleted

## 2017-02-25 NOTE — Telephone Encounter (Signed)
Patient given detailed instructions per Myocardial Perfusion Study Information Sheet for the test on 03/02/17. Patient notified to arrive 15 minutes early and that it is imperative to arrive on time for appointment to keep from having the test rescheduled.  If you need to cancel or reschedule your appointment, please call the office within 24 hours of your appointment. Failure to do so may result in a cancellation of your appointment, and a $50 no show fee. Patient verbalized understanding. Kirstie Peri

## 2017-03-02 ENCOUNTER — Telehealth (HOSPITAL_COMMUNITY): Payer: Self-pay

## 2017-03-02 ENCOUNTER — Encounter (HOSPITAL_COMMUNITY): Payer: Medicare Other

## 2017-03-04 ENCOUNTER — Encounter (INDEPENDENT_AMBULATORY_CARE_PROVIDER_SITE_OTHER): Payer: Self-pay

## 2017-03-04 ENCOUNTER — Ambulatory Visit (HOSPITAL_COMMUNITY): Payer: Medicare Other | Attending: Cardiovascular Disease

## 2017-03-04 DIAGNOSIS — I5189 Other ill-defined heart diseases: Secondary | ICD-10-CM

## 2017-03-04 DIAGNOSIS — I519 Heart disease, unspecified: Secondary | ICD-10-CM

## 2017-03-04 DIAGNOSIS — Z950 Presence of cardiac pacemaker: Secondary | ICD-10-CM | POA: Insufficient documentation

## 2017-03-04 DIAGNOSIS — I10 Essential (primary) hypertension: Secondary | ICD-10-CM | POA: Insufficient documentation

## 2017-03-04 DIAGNOSIS — E119 Type 2 diabetes mellitus without complications: Secondary | ICD-10-CM | POA: Insufficient documentation

## 2017-03-04 DIAGNOSIS — I2510 Atherosclerotic heart disease of native coronary artery without angina pectoris: Secondary | ICD-10-CM | POA: Diagnosis not present

## 2017-03-04 LAB — MYOCARDIAL PERFUSION IMAGING
CHL CUP NUCLEAR SDS: 7
LHR: 0.42
LV dias vol: 214 mL (ref 62–150)
LVSYSVOL: 128 mL
Peak HR: 80 {beats}/min
Rest HR: 70 {beats}/min
SRS: 14
SSS: 20
TID: 1.16

## 2017-03-04 MED ORDER — TECHNETIUM TC 99M TETROFOSMIN IV KIT
10.6000 | PACK | Freq: Once | INTRAVENOUS | Status: AC | PRN
  Administered 2017-03-04: 10.6 via INTRAVENOUS
  Filled 2017-03-04: qty 11

## 2017-03-04 MED ORDER — TECHNETIUM TC 99M TETROFOSMIN IV KIT
30.8000 | PACK | Freq: Once | INTRAVENOUS | Status: AC | PRN
  Administered 2017-03-04: 30.8 via INTRAVENOUS
  Filled 2017-03-04: qty 31

## 2017-03-04 MED ORDER — REGADENOSON 0.4 MG/5ML IV SOLN
0.4000 mg | Freq: Once | INTRAVENOUS | Status: AC
  Administered 2017-03-04: 0.4 mg via INTRAVENOUS

## 2017-03-05 ENCOUNTER — Ambulatory Visit: Payer: Medicare Other | Admitting: Physician Assistant

## 2017-03-08 ENCOUNTER — Encounter: Payer: Self-pay | Admitting: Physician Assistant

## 2017-03-08 ENCOUNTER — Ambulatory Visit (INDEPENDENT_AMBULATORY_CARE_PROVIDER_SITE_OTHER): Payer: Medicare Other | Admitting: Physician Assistant

## 2017-03-08 ENCOUNTER — Encounter (INDEPENDENT_AMBULATORY_CARE_PROVIDER_SITE_OTHER): Payer: Self-pay

## 2017-03-08 ENCOUNTER — Encounter: Payer: Self-pay | Admitting: *Deleted

## 2017-03-08 VITALS — BP 152/66 | HR 62 | Ht 72.0 in | Wt 266.4 lb

## 2017-03-08 DIAGNOSIS — I481 Persistent atrial fibrillation: Secondary | ICD-10-CM

## 2017-03-08 DIAGNOSIS — I2510 Atherosclerotic heart disease of native coronary artery without angina pectoris: Secondary | ICD-10-CM | POA: Diagnosis not present

## 2017-03-08 DIAGNOSIS — I10 Essential (primary) hypertension: Secondary | ICD-10-CM | POA: Diagnosis not present

## 2017-03-08 DIAGNOSIS — I4819 Other persistent atrial fibrillation: Secondary | ICD-10-CM

## 2017-03-08 DIAGNOSIS — R9439 Abnormal result of other cardiovascular function study: Secondary | ICD-10-CM
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
## 2017-03-08 NOTE — Patient Instructions (Addendum)
Medication Instructions:   Your physician recommends that you continue on your current medications as directed. Please refer to the Current Medication list given to you today.   If you need a refill on your cardiac medications before your next appointment, please call your pharmacy.  Labwork: CBC AND BMET  TODAY    Testing/Procedures: SEE LETTER  LETTER FOR LEFT HEART CATH  ON 03-18-2017    Follow-Up:  1 MONTH POST CATH FOLLOW UP WITH DR TURNER    Any Other Special Instructions Will Be Listed Below (If Applicable).

## 2017-03-09 LAB — CBC
Hematocrit: 44.7 % (ref 37.5–51.0)
Hemoglobin: 15.3 g/dL (ref 13.0–17.7)
MCH: 31.7 pg (ref 26.6–33.0)
MCHC: 34.2 g/dL (ref 31.5–35.7)
MCV: 93 fL (ref 79–97)
PLATELETS: 260 10*3/uL (ref 150–379)
RBC: 4.83 x10E6/uL (ref 4.14–5.80)
RDW: 12.6 % (ref 12.3–15.4)
WBC: 7.7 10*3/uL (ref 3.4–10.8)

## 2017-03-09 LAB — BASIC METABOLIC PANEL
BUN/Creatinine Ratio: 15 (ref 10–24)
BUN: 16 mg/dL (ref 8–27)
CHLORIDE: 106 mmol/L (ref 96–106)
CO2: 19 mmol/L (ref 18–29)
Calcium: 10.3 mg/dL — ABNORMAL HIGH (ref 8.6–10.2)
Creatinine, Ser: 1.1 mg/dL (ref 0.76–1.27)
GFR calc Af Amer: 83 mL/min/{1.73_m2} (ref >59)
GFR calc non Af Amer: 72 mL/min/{1.73_m2} (ref >59)
GLUCOSE: 103 mg/dL — AB (ref 65–99)
POTASSIUM: 4.8 mmol/L (ref 3.5–5.2)
SODIUM: 144 mmol/L (ref 134–144)

## 2017-03-10 ENCOUNTER — Encounter: Payer: Self-pay | Admitting: *Deleted

## 2017-03-11 ENCOUNTER — Telehealth: Payer: Self-pay | Admitting: *Deleted

## 2017-03-11 DIAGNOSIS — R9439 Abnormal result of other cardiovascular function study: Secondary | ICD-10-CM

## 2017-03-11 NOTE — Telephone Encounter (Signed)
-----   Message from Jeanann Lewandowsky, [place] sent at 03/11/2017  8:14 AM EDT ----- Pt/inr wasn't ordered.  I will fwd to [facility] and she will see if they can add it, do it the morning of, or what needs to be done.

## 2017-03-11 NOTE — Telephone Encounter (Signed)
SPOKE TO PT AND  IS AWARE OF LAB RESULTS AND MORE LABS  NEED TO BE DRAWN PT/INR TWO DAYS PRIOR TO TO PROCEDURE ON 03-18-17. PT WILL COME ON 03-16-17 FOR LAB WORK

## 2017-03-16 ENCOUNTER — Other Ambulatory Visit: Payer: Medicare Other

## 2017-03-16 DIAGNOSIS — R9439 Abnormal result of other cardiovascular function study: Secondary | ICD-10-CM

## 2017-03-17 DIAGNOSIS — R9439 Abnormal result of other cardiovascular function study: Secondary | ICD-10-CM

## 2017-03-17 LAB — PROTIME-INR
INR: 1 (ref 0.8–1.2)
PROTHROMBIN TIME: 10.6 s (ref 9.1–12.0)

## 2017-03-18 ENCOUNTER — Other Ambulatory Visit: Payer: Self-pay

## 2017-03-18 ENCOUNTER — Ambulatory Visit (HOSPITAL_COMMUNITY)
Admission: RE | Admit: 2017-03-18 | Discharge: 2017-03-18 | Disposition: A | Discharge status: Home / Self Care | Payer: Medicare Other | Source: Ambulatory Visit | Attending: Internal Medicine | Admitting: Internal Medicine

## 2017-03-18 ENCOUNTER — Ambulatory Visit: Payer: Medicare Other | Admitting: Cardiology

## 2017-03-18 ENCOUNTER — Encounter (HOSPITAL_COMMUNITY)
Admission: RE | Disposition: A | Discharge status: Home / Self Care | Payer: Self-pay | Source: Ambulatory Visit | Attending: Internal Medicine

## 2017-03-18 DIAGNOSIS — R9439 Abnormal result of other cardiovascular function study: Secondary | ICD-10-CM

## 2017-03-18 DIAGNOSIS — I110 Hypertensive heart disease with heart failure: Secondary | ICD-10-CM | POA: Diagnosis not present

## 2017-03-18 DIAGNOSIS — F17210 Nicotine dependence, cigarettes, uncomplicated: Secondary | ICD-10-CM | POA: Insufficient documentation

## 2017-03-18 DIAGNOSIS — E1142 Type 2 diabetes mellitus with diabetic polyneuropathy: Secondary | ICD-10-CM | POA: Diagnosis not present

## 2017-03-18 DIAGNOSIS — E669 Obesity, unspecified: Secondary | ICD-10-CM | POA: Insufficient documentation

## 2017-03-18 DIAGNOSIS — Z6836 Body mass index (BMI) 36.0-36.9, adult: Secondary | ICD-10-CM | POA: Diagnosis not present

## 2017-03-18 DIAGNOSIS — I5020 Unspecified systolic (congestive) heart failure: Secondary | ICD-10-CM | POA: Insufficient documentation

## 2017-03-18 DIAGNOSIS — I25718 Atherosclerosis of autologous vein coronary artery bypass graft(s) with other forms of angina pectoris: Secondary | ICD-10-CM | POA: Diagnosis not present

## 2017-03-18 DIAGNOSIS — E785 Hyperlipidemia, unspecified: Secondary | ICD-10-CM | POA: Diagnosis not present

## 2017-03-18 DIAGNOSIS — M1990 Unspecified osteoarthritis, unspecified site: Secondary | ICD-10-CM | POA: Diagnosis not present

## 2017-03-18 DIAGNOSIS — G8929 Other chronic pain: Secondary | ICD-10-CM | POA: Diagnosis not present

## 2017-03-18 DIAGNOSIS — I2582 Chronic total occlusion of coronary artery: Secondary | ICD-10-CM | POA: Diagnosis not present

## 2017-03-18 DIAGNOSIS — Z7901 Long term (current) use of anticoagulants: Secondary | ICD-10-CM | POA: Insufficient documentation

## 2017-03-18 DIAGNOSIS — I482 Chronic atrial fibrillation: Secondary | ICD-10-CM | POA: Diagnosis not present

## 2017-03-18 DIAGNOSIS — I2510 Atherosclerotic heart disease of native coronary artery without angina pectoris: Secondary | ICD-10-CM | POA: Diagnosis not present

## 2017-03-18 DIAGNOSIS — F329 Major depressive disorder, single episode, unspecified: Secondary | ICD-10-CM | POA: Diagnosis not present

## 2017-03-18 DIAGNOSIS — F419 Anxiety disorder, unspecified: Secondary | ICD-10-CM | POA: Diagnosis not present

## 2017-03-18 DIAGNOSIS — I25709 Atherosclerosis of coronary artery bypass graft(s), unspecified, with unspecified angina pectoris: Secondary | ICD-10-CM | POA: Diagnosis present

## 2017-03-18 DIAGNOSIS — I10 Essential (primary) hypertension: Secondary | ICD-10-CM | POA: Diagnosis present

## 2017-03-18 DIAGNOSIS — Z950 Presence of cardiac pacemaker: Secondary | ICD-10-CM | POA: Insufficient documentation

## 2017-03-18 HISTORY — PX: ULTRASOUND GUIDANCE FOR VASCULAR ACCESS: SHX6516

## 2017-03-18 HISTORY — PX: LEFT HEART CATH AND CORONARY ANGIOGRAPHY: CATH118249

## 2017-03-18 LAB — GLUCOSE, CAPILLARY: GLUCOSE-CAPILLARY: 96 mg/dL (ref 65–99)

## 2017-03-18 SURGERY — LEFT HEART CATH AND CORONARY ANGIOGRAPHY
Anesthesia: LOCAL

## 2017-03-18 MED ORDER — LIDOCAINE HCL (PF) 1 % IJ SOLN
INTRAMUSCULAR | Status: AC
  Filled 2017-03-18: qty 30

## 2017-03-18 MED ORDER — ONDANSETRON HCL 4 MG/2ML IJ SOLN: 4.0000 mg | Freq: Four times a day (QID) | INTRAMUSCULAR | Status: DC | PRN

## 2017-03-18 MED ORDER — MIDAZOLAM HCL 2 MG/2ML IJ SOLN
INTRAMUSCULAR | Status: AC
  Filled 2017-03-18: qty 2

## 2017-03-18 MED ORDER — SODIUM CHLORIDE 0.9% FLUSH: 3.0000 mL | INTRAVENOUS | Status: DC | PRN

## 2017-03-18 MED ORDER — FENTANYL CITRATE (PF) 100 MCG/2ML IJ SOLN
INTRAMUSCULAR | Status: DC | PRN
  Administered 2017-03-18 (×2): 50 ug via INTRAVENOUS

## 2017-03-18 MED ORDER — SODIUM CHLORIDE 0.9 % IV SOLN
INTRAVENOUS | Status: DC | PRN
  Administered 2017-03-18: 121 mL/h via INTRAVENOUS

## 2017-03-18 MED ORDER — ACETAMINOPHEN 325 MG PO TABS: 650.0000 mg | ORAL_TABLET | ORAL | Status: DC | PRN

## 2017-03-18 MED ORDER — SODIUM CHLORIDE 0.9 % WEIGHT BASED INFUSION: 1.0000 mL/kg/h | INTRAVENOUS | Status: DC

## 2017-03-18 MED ORDER — VERAPAMIL HCL 2.5 MG/ML IV SOLN
INTRAVENOUS | Status: DC | PRN
  Administered 2017-03-18: 10 mL via INTRA_ARTERIAL

## 2017-03-18 MED ORDER — SODIUM CHLORIDE 0.9 % IV SOLN: 250.0000 mL | INTRAVENOUS | Status: DC | PRN

## 2017-03-18 MED ORDER — FENTANYL CITRATE (PF) 100 MCG/2ML IJ SOLN
INTRAMUSCULAR | Status: AC
  Filled 2017-03-18: qty 2

## 2017-03-18 MED ORDER — MIDAZOLAM HCL 2 MG/2ML IJ SOLN
INTRAMUSCULAR | Status: DC | PRN
  Administered 2017-03-18 (×3): 1 mg via INTRAVENOUS

## 2017-03-18 MED ORDER — IOPAMIDOL (ISOVUE-370) INJECTION 76%
INTRAVENOUS | Status: DC | PRN
  Administered 2017-03-18: 115 mL via INTRAVENOUS

## 2017-03-18 MED ORDER — OXYCODONE-ACETAMINOPHEN 5-325 MG PO TABS: 1.0000 | ORAL_TABLET | ORAL | Status: DC | PRN

## 2017-03-18 MED ORDER — SODIUM CHLORIDE 0.9 % WEIGHT BASED INFUSION
3.0000 mL/kg/h | INTRAVENOUS | Status: AC
  Administered 2017-03-18: 3 mL/kg/h via INTRAVENOUS

## 2017-03-18 MED ORDER — ASPIRIN 81 MG PO CHEW: 81.0000 mg | CHEWABLE_TABLET | ORAL | Status: DC

## 2017-03-18 MED ORDER — SODIUM CHLORIDE 0.9 % WEIGHT BASED INFUSION: 3.0000 mL/kg/h | INTRAVENOUS | Status: DC

## 2017-03-18 MED ORDER — ASPIRIN 81 MG PO CHEW
CHEWABLE_TABLET | ORAL | Status: AC
  Filled 2017-03-18: qty 1

## 2017-03-18 MED ORDER — SODIUM CHLORIDE 0.9% FLUSH: 3.0000 mL | Freq: Two times a day (BID) | INTRAVENOUS | Status: DC

## 2017-03-18 MED ORDER — VERAPAMIL HCL 2.5 MG/ML IV SOLN
INTRAVENOUS | Status: AC
  Filled 2017-03-18: qty 2

## 2017-03-18 MED ORDER — IOPAMIDOL (ISOVUE-370) INJECTION 76%
INTRAVENOUS | Status: AC
  Filled 2017-03-18: qty 125

## 2017-03-18 MED ORDER — HEPARIN (PORCINE) IN NACL 2-0.9 UNIT/ML-% IJ SOLN
INTRAMUSCULAR | Status: AC
  Filled 2017-03-18: qty 500

## 2017-03-18 MED ORDER — HEPARIN SODIUM (PORCINE) 1000 UNIT/ML IJ SOLN
INTRAMUSCULAR | Status: AC
  Filled 2017-03-18: qty 1

## 2017-03-18 MED ORDER — HEPARIN SODIUM (PORCINE) 1000 UNIT/ML IJ SOLN
INTRAMUSCULAR | Status: DC | PRN
  Administered 2017-03-18: 6000 [IU] via INTRAVENOUS

## 2017-03-18 MED ORDER — LIDOCAINE HCL (PF) 1 % IJ SOLN
INTRAMUSCULAR | Status: DC | PRN
  Administered 2017-03-18: 3 mL

## 2017-03-18 MED ORDER — HEPARIN (PORCINE) IN NACL 2-0.9 UNIT/ML-% IJ SOLN
INTRAMUSCULAR | Status: DC | PRN
Start: 2017-03-18 — End: 2017-03-18
  Administered 2017-03-18: 1000 mL

## 2017-03-18 MED ORDER — SODIUM CHLORIDE 0.9 % IV SOLN: INTRAVENOUS | Status: DC

## 2017-03-18 MED ORDER — ASPIRIN 81 MG PO CHEW
81.0000 mg | CHEWABLE_TABLET | ORAL | Status: AC
  Administered 2017-03-18: 81 mg via ORAL

## 2017-03-18 SURGICAL SUPPLY — 12 items
CATH INFINITI 5FR JL4 (CATHETERS) ×1 IMPLANT
CATH INFINITI JR4 5F (CATHETERS) ×1 IMPLANT
COVER PRB 48X5XTLSCP FOLD TPE (BAG) IMPLANT
COVER PROBE 5X48 (BAG) ×3
DEVICE RAD COMP TR BAND LRG (VASCULAR PRODUCTS) ×1 IMPLANT
GLIDESHEATH SLEND A-KIT 6F 22G (SHEATH) ×1 IMPLANT
GUIDEWIRE INQWIRE 1.5J.035X260 (WIRE) IMPLANT
INQWIRE 1.5J .035X260CM (WIRE) ×3
KIT HEART LEFT (KITS) ×3 IMPLANT
PACK CARDIAC CATHETERIZATION (CUSTOM PROCEDURE TRAY) ×3 IMPLANT
TRANSDUCER W/STOPCOCK (MISCELLANEOUS) ×3 IMPLANT
TUBING CIL FLEX 10 FLL-RA (TUBING) ×3 IMPLANT
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
## 2017-03-18 NOTE — Discharge Instructions (Signed)
Restart eliquis 3/30 EVENING Radial Site Care Refer to this sheet in the next few weeks. These instructions provide you with information about caring for yourself after your procedure. Your health care provider may also give you more specific instructions. Your treatment has been planned according to current medical practices, but problems sometimes occur. Call your health care provider if you have any problems or questions after your procedure. What can I expect after the procedure? After your procedure, it is typical to have the following:  Bruising at the radial site that usually fades within 1-2 weeks.  Blood collecting in the tissue (hematoma) that may be painful to the touch. It should usually decrease in size and tenderness within 1-2 weeks. Follow these instructions at home:  Take medicines only as directed by your health care provider.  You may shower 24-48 hours after the procedure or as directed by your health care provider. Remove the bandage (dressing) and gently wash the site with plain soap and water. Pat the area dry with a clean towel. Do not rub the site, because this may cause bleeding.  Do not take baths, swim, or use a hot tub until your health care provider approves.  Check your insertion site every day for redness, swelling, or drainage.  Do not apply powder or lotion to the site.  Do not flex or bend the affected arm for 24 hours or as directed by your health care provider.  Do not push or pull heavy objects with the affected arm for 24 hours or as directed by your health care provider.  Do not lift over 10 lb (4.5 kg) for 5 days after your procedure or as directed by your health care provider.  Ask your health care provider when it is okay to:  Return to work or school.  Resume usual physical activities or sports.  Resume sexual activity.  Do not drive home if you are discharged the same day as the procedure. Have someone else drive you.  You may drive 24  hours after the procedure unless otherwise instructed by your health care provider.  Do not operate machinery or power tools for 24 hours after the procedure.  If your procedure was done as an outpatient procedure, which means that you went home the same day as your procedure, a responsible adult should be with you for the first 24 hours after you arrive home.  Keep all follow-up visits as directed by your health care provider. This is important. Contact a health care provider if:  You have a fever.  You have chills. You have increased bleeding from the radial site. Hold pressure on the site. CALL 911 Get help right away if:  You have unusual pain at the radial site.  You have redness, warmth, or swelling at the radial site.  You have drainage (other than a small amount of blood on the dressing) from the radial site.  The radial site is bleeding, and the bleeding does not stop after 30 minutes of holding steady pressure on the site.  Your arm or hand becomes pale, cool, tingly, or numb. This information is not intended to replace advice given to you by your health care provider. Make sure you discuss any questions you have with your health care provider. Document Released: 01/09/2011 Document Revised: 05/14/2016 Document Reviewed: 06/25/2014 Elsevier Interactive Patient Education  2017 [employer].

## 2017-03-18 NOTE — Interval H&P Note (Signed)
Cath Lab Visit (complete for each Cath Lab visit)  Clinical Evaluation Leading to the Procedure:   ACS: No.  Non-ACS:    Anginal Classification: CCS IV  Anti-ischemic medical therapy: Maximal Therapy (2 or more classes of medications)  Non-Invasive Test Results: High-risk stress test findings: cardiac mortality >3%/year  Prior CABG: Previous CABG      History and Physical Interval Note:  03/18/2017 10:32 AM  Ross [name]  has presented today for surgery, with the diagnosis of abnormal stress test  The various methods of treatment have been discussed with the patient and family. After consideration of risks, benefits and other options for treatment, the patient has consented to  Procedure(s): Left Heart Cath and Coronary Angiography (N/A) as a surgical intervention .  The patient's history has been reviewed, patient examined, no change in status, stable for surgery.  I have reviewed the patient's chart and labs.  Questions were answered to the patient's satisfaction.     Ross [name]

## 2017-03-19 ENCOUNTER — Encounter (HOSPITAL_COMMUNITY): Payer: Self-pay | Admitting: Interventional Cardiology

## 2017-04-15 ENCOUNTER — Other Ambulatory Visit: Payer: Self-pay | Admitting: Physician Assistant

## 2017-04-15 ENCOUNTER — Ambulatory Visit
Admission: RE | Admit: 2017-04-15 | Discharge: 2017-04-15 | Disposition: A | Discharge status: Home / Self Care | Payer: Medicare Other | Source: Ambulatory Visit | Attending: Physician Assistant | Admitting: Physician Assistant

## 2017-04-15 DIAGNOSIS — M545 Low back pain: Secondary | ICD-10-CM

## 2017-04-15 DIAGNOSIS — G894 Chronic pain syndrome: Secondary | ICD-10-CM

## 2017-04-26 ENCOUNTER — Telehealth: Payer: Self-pay | Admitting: Cardiology

## 2017-04-26 ENCOUNTER — Ambulatory Visit (INDEPENDENT_AMBULATORY_CARE_PROVIDER_SITE_OTHER): Payer: Medicare Other | Admitting: *Deleted

## 2017-04-26 DIAGNOSIS — I482 Chronic atrial fibrillation, unspecified: Secondary | ICD-10-CM

## 2017-04-26 DIAGNOSIS — I495 Sick sinus syndrome: Secondary | ICD-10-CM

## 2017-04-26 NOTE — Telephone Encounter (Signed)
Spoke with pt and reminded pt of remote transmission that is due today. Pt verbalized understanding.   

## 2017-04-26 NOTE — Telephone Encounter (Signed)
I called pt to remind him of his remote check that is due today. He stated that the Coreg wasn't working for his blood pressure. So he stopped and went back to taking the Amlodipine. His blood pressure on the Coreg was 160-165 / 95-105. Informed him that I would forward a note to MD nurse.

## 2017-04-27 LAB — CUP PACEART REMOTE DEVICE CHECK
Implantable Lead Implant Date: 20170126
Implantable Lead Location: 753860
Implantable Pulse Generator Implant Date: 20170126
Lead Channel Impedance Value: 532 Ohm
Lead Channel Pacing Threshold Amplitude: 0.5 V
Lead Channel Pacing Threshold Pulse Width: 0.4 ms
Lead Channel Sensing Intrinsic Amplitude: 23.125 mV
Lead Channel Setting Pacing Pulse Width: 0.4 ms
MDC IDC MSMT BATTERY REMAINING LONGEVITY: 113 mo
MDC IDC MSMT BATTERY VOLTAGE: 3.02 V
MDC IDC MSMT LEADCHNL RV IMPEDANCE VALUE: 475 Ohm
MDC IDC MSMT LEADCHNL RV SENSING INTR AMPL: 23.125 mV
MDC IDC SESS DTM: 20180507201218
MDC IDC SET LEADCHNL RV PACING AMPLITUDE: 2 V
MDC IDC SET LEADCHNL RV SENSING SENSITIVITY: 2 mV
MDC IDC STAT BRADY RV PERCENT PACED: 90.78 %

## 2017-04-27 NOTE — Progress Notes (Signed)
Remote pacemaker transmission.   

## 2017-04-28 ENCOUNTER — Telehealth: Payer: Self-pay | Admitting: *Deleted

## 2017-04-28 NOTE — Telephone Encounter (Signed)
Ok to increase coreg

## 2017-04-28 NOTE — Telephone Encounter (Signed)
-----   Message from [name], MD sent at 04/27/2017 12:42 PM EDT ----- Abnormal device interrogation reviewed.  Lead parameters and battery status stable.  Increase coreg to 12.5 mg

## 2017-04-28 NOTE — Telephone Encounter (Signed)
Scheduled with Dr. Radford Pax 04/30/17. Ok to increase Coreg to 12.5mg  BID at OV?

## 2017-04-28 NOTE — Telephone Encounter (Signed)
Called patient to INCREASE COREG to 12.5 mg BID and to postpone appointment for about a week. Patient reports he stopped Coreg and restarted Amlodipine 10 mg daily because his BP was "dangerously out of control" about 3 weeks ago. He states his BP was running 165/100 and it is now "fine."  He states Amlodipine is the only medication that has ever been able to control his BP.   He is very frustrated and angry because no one called him about his cath results. Informed him that his results are discussed prior to discharge. He is adamant that no one gave him results and that he "clearly knows something is wrong." He states he is "100% sure that the doctor at the cath said he was leaving to talk to Dr. Radford Pax, he came back, stated he didn't even know why he was there, and left."   Apologized to the patient he did not feel his results were stated clearly. Reiterated to the patient to call the office for blood pressure concerns prior to making changes.  He awaits a call back with Dr. Theodosia Blender recommendations.

## 2017-04-29 MED ORDER — CARVEDILOL 12.5 MG PO TABS: 12.5000 mg | ORAL_TABLET | Freq: Two times a day (BID) | ORAL | 3 refills | Status: DC

## 2017-04-29 NOTE — Telephone Encounter (Signed)
lmtcb

## 2017-04-29 NOTE — Telephone Encounter (Signed)
Patient calling back. Patient made aware that he has 2 grafts occluded and that his heart function is moderately reduced at 35%. Patient made aware that Dr. Radford Pax states that he needs to be on carvedilol 12.5 MG BID. Patient in agreement to stop the amlodipine and restart the carvedilol at the higher dose for his BP. Rx sent to [facility] per patient's request. Patient will take 2 of the 6.25 mg tablets BID until new Rx comes in the mail. Patient made aware that Dr. Radford Pax wants him referred to the HF clinic. Patient is in agreement. Message sent to pcc pool. Patient aware that he will receive a call to schedule this appt. Patient's appointment with Dr. Radford Pax for tomorrow was rescheduled to 05/13/17 at 2:30 PM with Nell Range, PA. Patient still has multiple questions regarding his heart cath that he would like discussed at this appointment.

## 2017-04-29 NOTE — Telephone Encounter (Signed)
Patient has 2 grafts occluded and heart function is moderately reduced at 35%.  He needs to be on Coreg.  Please have him restart Coreg 12.5mg  BID and needs to followup with PA in 2 weeks.  I also want him referred to advanced HF clinic

## 2017-04-30 ENCOUNTER — Encounter: Payer: Self-pay | Admitting: Cardiology

## 2017-04-30 ENCOUNTER — Ambulatory Visit: Payer: Medicare Other | Admitting: Cardiology

## 2017-05-03 ENCOUNTER — Telehealth (HOSPITAL_COMMUNITY): Payer: Self-pay | Admitting: Vascular Surgery

## 2017-05-03 NOTE — Telephone Encounter (Signed)
Spoke to pt , he does not want to make appt w/ hf clinic until he speaks to Dr. Radford Pax

## 2017-05-07 ENCOUNTER — Telehealth: Payer: Self-pay

## 2017-05-07 NOTE — Telephone Encounter (Signed)
05/07/2017 11:40 Message left for patient requesting return call. Dr Radford Pax has requested patient change primary cardiologists due to uncomfortable comments patient has made during his clinic visits with patient. Dr Tamala Julian has agreed to assume patient's care during conversation with writer today. Georgana Curio MHA RN CCM

## 2017-05-07 NOTE — Telephone Encounter (Signed)
New message   Patient is calling for cath results.

## 2017-05-07 NOTE — Telephone Encounter (Signed)
05/07/2017 12:50 Spoke with patient, explained change in primary cardiologist to Dr Tamala Julian, pt was agreeable.  Pt requested appt for 05/13/2017 be postponed because he will be visiting family out of town.  Transferred call to scheduling. Georgana Curio MHA RN CCM

## 2017-05-07 NOTE — Telephone Encounter (Signed)
Spoke with pt and he would like to more info about the outcome of his cath.  He states he is aware that he had some blockages but wants to know why no stent or balloon therapy was done.  Pt very concerned and would like more information about what tx he will need or follow up based on cath findings. Advised I would send message to Dr. Tamala Julian and call back once I hear back from him.  Pt agreeable.

## 2017-05-08 NOTE — Telephone Encounter (Signed)
Advanced diffuse CAD with CHRONIC total occlusion of all but the LIMA to LAD. No way to open closed bypass grafts.. Natural arteries are occluded and no safe sites to open. Will discuss at [place].

## 2017-05-10 NOTE — Telephone Encounter (Signed)
Left message to call back  

## 2017-05-10 NOTE — Telephone Encounter (Signed)
Spoke with pt and made him aware of information provided by Dr. Tamala Julian. Answered pt's questions and advised Dr. Tamala Julian will go over with him more at appt.  Pt verbalized understanding.

## 2017-05-13 ENCOUNTER — Ambulatory Visit: Payer: Medicare Other | Admitting: Physician Assistant
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
## 2017-06-16 ENCOUNTER — Encounter: Payer: Self-pay | Admitting: Interventional Cardiology

## 2017-06-16 ENCOUNTER — Ambulatory Visit (INDEPENDENT_AMBULATORY_CARE_PROVIDER_SITE_OTHER): Payer: Medicare Other | Admitting: Interventional Cardiology

## 2017-06-16 VITALS — BP 146/82 | HR 63 | Ht 73.5 in | Wt 273.8 lb

## 2017-06-16 DIAGNOSIS — I482 Chronic atrial fibrillation, unspecified: Secondary | ICD-10-CM

## 2017-06-16 DIAGNOSIS — I25709 Atherosclerosis of coronary artery bypass graft(s), unspecified, with unspecified angina pectoris: Secondary | ICD-10-CM | POA: Diagnosis not present

## 2017-06-16 DIAGNOSIS — I10 Essential (primary) hypertension: Secondary | ICD-10-CM

## 2017-06-16 DIAGNOSIS — I5042 Chronic combined systolic (congestive) and diastolic (congestive) heart failure: Secondary | ICD-10-CM

## 2017-06-16 DIAGNOSIS — E785 Hyperlipidemia, unspecified: Secondary | ICD-10-CM

## 2017-06-16 DIAGNOSIS — Z950 Presence of cardiac pacemaker: Secondary | ICD-10-CM

## 2017-06-16 MED ORDER — ISOSORBIDE MONONITRATE ER 60 MG PO TB24
60.0000 mg | ORAL_TABLET | Freq: Every day | ORAL | 3 refills | Status: DC
Start: 2017-06-16 — End: 2017-07-30

## 2017-06-16 MED ORDER — SPIRONOLACTONE 25 MG PO TABS: 12.5000 mg | ORAL_TABLET | Freq: Every day | ORAL | 1 refills | Status: DC

## 2017-06-16 NOTE — Patient Instructions (Signed)
Medication Instructions:  1) START taking Isosorbide 60mg  once daily.  Stop taking 30mg  twice daily. 2) START Spironolactone 12.5mg  once daily.  Labwork: Your physician recommends that you return for lab work in: 2 weeks (BMET)   Testing/Procedures: None  Follow-Up: Your physician recommends that you schedule a follow-up appointment in: 1 month with Dr. Tamala Julian.    Any Other Special Instructions Will Be Listed Below (If Applicable).     If you need a refill on your cardiac medications before your next appointment, please call your pharmacy.

## 2017-06-21 ENCOUNTER — Telehealth: Payer: Self-pay | Admitting: Interventional Cardiology

## 2017-06-21 NOTE — Telephone Encounter (Signed)
Determine if she is taking isosorbide as instructed, 60 mg once daily? Determine if she is still having headache. Okay to remain off spironolactone. It is unusual to have headache with this medication and it is possible that changing the Imdur/isosorbide has a role.  I need to know exactly what he is taking now that he feels better.

## 2017-06-21 NOTE — Telephone Encounter (Signed)
Follow Up:    Pt started taking Spironolactone last Thursday. He had terrible side effects, He was soo short of breath,felt like he was chocking to death,His head was hurting so bad and heart hurting, it felt like he was having a heart attack. He felt so bad,he could not even move,did not have the strength or energy to go to the hospital. He have not took this medicine since Saturday. Please call to advise.

## 2017-06-21 NOTE — Telephone Encounter (Signed)
Pt states that he took Spironolactone Thurs, Fri and Sat last week and each day, approximately 45 mins after taking medication pt's head and chest started hurting, SOB, felt like he was choking for air, slight lightheadedness and dizziness and pt said during one episode had blurry vision.  Episodes would last 3 hrs.   Pt said each time he was too fatigued to do anything and would just fall asleep for about 45 mins and would feel better when he woke up but still didn't feel great. Has not taken medication since Saturday morning and has had no further issues.  Took BP x 1 several hrs after episode and it was 140's/85.   Advised pt I would send message to Dr. Tamala Julian for review and advisement.

## 2017-06-22 NOTE — Telephone Encounter (Signed)
Lets go with this for now. HA will get better over time.

## 2017-06-22 NOTE — Telephone Encounter (Signed)
Spoke with pt and he states that he is still having HA but they aren't as bad as when he was taking the Spironolactone.  Pt states that on 1-10 scale on Spiro HA was a 10+, and now off of Arlyce Harman is more of a 5.  Pt taking: Isosorbide 60mg  QD, Lisinopril 40mg  QD and Carvediolol 12.5mg  BID since he started feeling better.  Will route to Dr. Tamala Julian for review.

## 2017-06-22 NOTE — Telephone Encounter (Signed)
Left message to call back  

## 2017-06-22 NOTE — Telephone Encounter (Signed)
Follow Up Call :  Ross [name] is returningyour call . Thanks

## 2017-06-24 NOTE — Telephone Encounter (Signed)
Spoke with pt and made him aware of Dr. Thompson Caul recommendations.  Pt verbalized understanding and was in agreement with this plan.

## 2017-07-01 ENCOUNTER — Other Ambulatory Visit: Payer: Medicare Other

## 2017-07-01 ENCOUNTER — Telehealth: Payer: Self-pay | Admitting: Interventional Cardiology

## 2017-07-01 NOTE — Telephone Encounter (Signed)
F/U call: This dizzy spell has never happened before.

## 2017-07-01 NOTE — Telephone Encounter (Signed)
Patient calling, states that he had a dizzy spell that lasted for 2 hours on Tuesday morning and this has happened before. Patient states " I got up to use restroom and the room was spinning and I fell." Patient wife had to help patient back to the bed. Please call to f/u.

## 2017-07-01 NOTE — Telephone Encounter (Signed)
Follow up    Pt is calling back to RN.

## 2017-07-01 NOTE — Telephone Encounter (Signed)
Spoke with pt and he states on Tues he got up in the middle of the night to go to the restroom and was very dizzy upon standing.  Pt did fall but did not sustain any injuries.  States this episode lasted about 6 hours and then he took a nap and everything was better when he woke up.  Has had no further issues.  Denies CP, SOB, or N/V.  Advised pt to continue to monitor and if more episodes occurred to contact our office to go to PCP to rule out possible vertigo.  Pt verbalized understanding and was appreciative for call.

## 2017-07-01 NOTE — Telephone Encounter (Signed)
Left message to call back  Spoke with Dr. Tamala Julian and he said to have pt continue to monitor since episode was only one time.

## 2017-07-26 ENCOUNTER — Encounter: Payer: Medicare Other | Admitting: *Deleted

## 2017-07-28 ENCOUNTER — Encounter: Payer: Self-pay | Admitting: Cardiology

## 2017-07-30 ENCOUNTER — Ambulatory Visit (INDEPENDENT_AMBULATORY_CARE_PROVIDER_SITE_OTHER): Payer: Medicare Other | Admitting: *Deleted

## 2017-07-30 ENCOUNTER — Encounter: Payer: Self-pay | Admitting: Interventional Cardiology

## 2017-07-30 ENCOUNTER — Ambulatory Visit (INDEPENDENT_AMBULATORY_CARE_PROVIDER_SITE_OTHER): Payer: Medicare Other | Admitting: Interventional Cardiology

## 2017-07-30 ENCOUNTER — Other Ambulatory Visit: Payer: Medicare Other

## 2017-07-30 ENCOUNTER — Telehealth: Payer: Self-pay | Admitting: Cardiology

## 2017-07-30 VITALS — BP 138/82 | HR 87 | Ht 73.5 in | Wt 265.2 lb

## 2017-07-30 DIAGNOSIS — I25709 Atherosclerosis of coronary artery bypass graft(s), unspecified, with unspecified angina pectoris: Secondary | ICD-10-CM

## 2017-07-30 DIAGNOSIS — I5042 Chronic combined systolic (congestive) and diastolic (congestive) heart failure: Secondary | ICD-10-CM

## 2017-07-30 DIAGNOSIS — I77810 Thoracic aortic ectasia: Secondary | ICD-10-CM

## 2017-07-30 DIAGNOSIS — Z950 Presence of cardiac pacemaker: Secondary | ICD-10-CM

## 2017-07-30 DIAGNOSIS — I10 Essential (primary) hypertension: Secondary | ICD-10-CM

## 2017-07-30 MED ORDER — CARVEDILOL 12.5 MG PO TABS: 18.7500 mg | ORAL_TABLET | Freq: Two times a day (BID) | ORAL | 3 refills | Status: DC

## 2017-07-30 MED ORDER — ISOSORBIDE MONONITRATE ER 30 MG PO TB24: 30.0000 mg | ORAL_TABLET | Freq: Every day | ORAL | 3 refills | Status: DC

## 2017-07-30 NOTE — Telephone Encounter (Signed)
New Message      1. Has your device fired? no  2. Is you device beeping?  no  3. Are you experiencing draining or swelling at device site? no  4. Are you calling to see if we received your device transmission? It was cancelled Monday   5. Have you passed out? no
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
## 2017-07-30 NOTE — Telephone Encounter (Signed)
Verbally instructed patient on how to send remote transmission. Remote transmission received successfully. Patient aware.

## 2017-07-30 NOTE — Patient Instructions (Addendum)
Medication Instructions:  1) DECREASE Isosorbide to 30mg  once daily 2) INCREASE Carvedilol to 1.5 tabs (18.75mg ) twice daily for two weeks.  If feeling ok at that time, please contact our office and let us know so we can consider increasing your dose to 25mg  twice daily.  Labwork: None  Testing/Procedures: None  Follow-Up: Your physician recommends that you schedule a follow-up appointment in: 3 months with Dr. Tamala Julian.   Any Other Special Instructions Will Be Listed Below (If Applicable).  Please go to the [facility] and participate in aerobic exercises. (20 minutes, 3 times per week)   If you need a refill on your cardiac medications before your next appointment, please call your pharmacy.

## 2017-08-02 ENCOUNTER — Other Ambulatory Visit: Payer: Medicare Other

## 2017-08-02 DIAGNOSIS — I10 Essential (primary) hypertension: Secondary | ICD-10-CM

## 2017-08-03 LAB — BASIC METABOLIC PANEL
BUN / CREAT RATIO: 18 (ref 10–24)
BUN: 18 mg/dL (ref 8–27)
CHLORIDE: 109 mmol/L — AB (ref 96–106)
CO2: 20 mmol/L (ref 20–29)
Calcium: 10.1 mg/dL (ref 8.6–10.2)
Creatinine, Ser: 0.99 mg/dL (ref 0.76–1.27)
GFR calc non Af Amer: 81 mL/min/{1.73_m2} (ref >59)
GFR, EST AFRICAN AMERICAN: 93 mL/min/{1.73_m2} (ref >59)
GLUCOSE: 94 mg/dL (ref 65–99)
Potassium: 4.4 mmol/L (ref 3.5–5.2)
SODIUM: 144 mmol/L (ref 134–144)

## 2017-08-05 NOTE — Progress Notes (Signed)
Remote PPM  

## 2017-08-13 ENCOUNTER — Encounter: Payer: Self-pay | Admitting: Cardiology

## 2017-08-17 LAB — CUP PACEART REMOTE DEVICE CHECK
Battery Remaining Longevity: 107 mo
Date Time Interrogation Session: 20180810134241
Implantable Lead Implant Date: 20170126
Implantable Lead Location: 753860
Implantable Pulse Generator Implant Date: 20170126
Lead Channel Impedance Value: 475 Ohm
Lead Channel Pacing Threshold Amplitude: 0.5 V
Lead Channel Sensing Intrinsic Amplitude: 31.625 mV
Lead Channel Setting Sensing Sensitivity: 2 mV
MDC IDC MSMT BATTERY VOLTAGE: 3.02 V
MDC IDC MSMT LEADCHNL RV IMPEDANCE VALUE: 551 Ohm
MDC IDC MSMT LEADCHNL RV PACING THRESHOLD PULSEWIDTH: 0.4 ms
MDC IDC MSMT LEADCHNL RV SENSING INTR AMPL: 31.625 mV
MDC IDC SET LEADCHNL RV PACING AMPLITUDE: 2 V
MDC IDC SET LEADCHNL RV PACING PULSEWIDTH: 0.4 ms
MDC IDC STAT BRADY RV PERCENT PACED: 95.28 %

## 2017-10-15 ENCOUNTER — Other Ambulatory Visit: Payer: Self-pay | Admitting: Cardiology

## 2017-10-15 ENCOUNTER — Telehealth: Payer: Self-pay | Admitting: Interventional Cardiology

## 2017-10-15 NOTE — Telephone Encounter (Signed)
°  New Prob  Has some questions and information to provide regarding directions/dosage for Carvedilol. Please call.

## 2017-10-15 NOTE — Telephone Encounter (Signed)
When pt was last seen, he was instructed to increase Coreg to 18.75BID and then increase to 25mg  BID if tolerated.  Pt states he has been taking Carvedilol 25mg  BID for several weeks now and has had no issues.  Denies fatigue, lightheadedness or dizziness.  States he checks his BP about 4x a day.  BP is fine about 2 hrs after medications but states it always jumps back up about 6-7 hrs after meds.  Pt did admit that he is not taking the medications approximately 12 hrs apart, more like 7-8 hours apart.  Last Wednesday had a terrible HA and checked BP and it was 176/109.  HA lasted W-Sat and has since resolved.  Had no issues since then.  Wed-Fri of last week pt states he took a second Imdur because he was having CP.  States once he would rest the pain would go away.  States he has CP about 2x a week.  States CP is no different than his usual chest discomfort.  Advised I would send message to Dr. Tamala Julian for review.  Pt wanting to know if he should continue Coreg 25 BID?

## 2017-10-17 NOTE — Telephone Encounter (Signed)
Continue the optimized dose of Carvedilol.

## 2017-10-18 MED ORDER — CARVEDILOL 25 MG PO TABS: 25.0000 mg | ORAL_TABLET | Freq: Two times a day (BID) | ORAL | 3 refills | Status: DC

## 2017-10-18 NOTE — Telephone Encounter (Signed)
Spoke with pt and went over recommendations per Dr. Tamala Julian.  Pt verbalized understanding.  Advised pt to contact office for sooner appt if chest discomfort worsens.  Pt appreciative for call.

## 2017-11-08 ENCOUNTER — Encounter (INDEPENDENT_AMBULATORY_CARE_PROVIDER_SITE_OTHER): Payer: Self-pay

## 2017-11-08 ENCOUNTER — Ambulatory Visit: Payer: Medicare Other | Admitting: Interventional Cardiology

## 2017-11-08 ENCOUNTER — Encounter: Payer: Self-pay | Admitting: Interventional Cardiology

## 2017-11-08 VITALS — BP 126/78 | HR 63 | Ht 73.5 in | Wt 263.8 lb

## 2017-11-08 DIAGNOSIS — I5042 Chronic combined systolic (congestive) and diastolic (congestive) heart failure: Secondary | ICD-10-CM | POA: Diagnosis not present

## 2017-11-08 DIAGNOSIS — I77810 Thoracic aortic ectasia: Secondary | ICD-10-CM

## 2017-11-08 DIAGNOSIS — E785 Hyperlipidemia, unspecified: Secondary | ICD-10-CM

## 2017-11-08 DIAGNOSIS — I10 Essential (primary) hypertension: Secondary | ICD-10-CM | POA: Diagnosis not present

## 2017-11-08 DIAGNOSIS — J449 Chronic obstructive pulmonary disease, unspecified: Secondary | ICD-10-CM | POA: Insufficient documentation

## 2017-11-08 DIAGNOSIS — J411 Mucopurulent chronic bronchitis: Secondary | ICD-10-CM

## 2017-11-08 DIAGNOSIS — I25709 Atherosclerosis of coronary artery bypass graft(s), unspecified, with unspecified angina pectoris: Secondary | ICD-10-CM | POA: Diagnosis not present
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
## 2017-11-08 NOTE — Patient Instructions (Signed)
Medication Instructions:  Your physician recommends that you continue on your current medications as directed. Please refer to the Current Medication list given to you today.  Labwork: None  Testing/Procedures: None  Follow-Up: Your physician wants you to follow-up in: July 2019 with Dr. Tamala Julian.  You will receive a reminder letter in the mail two months in advance. If you don't receive a letter, please call our office to schedule the follow-up appointment.   Any Other Special Instructions Will Be Listed Below (If Applicable).     If you need a refill on your cardiac medications before your next appointment, please call your pharmacy.

## 2017-11-09 ENCOUNTER — Telehealth: Payer: Self-pay | Admitting: Cardiology

## 2017-11-09 ENCOUNTER — Ambulatory Visit (INDEPENDENT_AMBULATORY_CARE_PROVIDER_SITE_OTHER): Payer: Medicare Other | Admitting: *Deleted

## 2017-11-09 DIAGNOSIS — I495 Sick sinus syndrome: Secondary | ICD-10-CM

## 2017-11-09 NOTE — Telephone Encounter (Signed)
LMOVM reminding pt to send remote transmission.   

## 2017-11-10 LAB — CUP PACEART REMOTE DEVICE CHECK
Battery Voltage: 3.02 V
Brady Statistic RV Percent Paced: 96.54 %
Date Time Interrogation Session: 20181120220035
Implantable Lead Implant Date: 20170126
Lead Channel Impedance Value: 532 Ohm
Lead Channel Pacing Threshold Amplitude: 0.5 V
Lead Channel Sensing Intrinsic Amplitude: 29.375 mV
Lead Channel Setting Pacing Amplitude: 2 V
Lead Channel Setting Pacing Pulse Width: 0.4 ms
MDC IDC LEAD LOCATION: 753860
MDC IDC MSMT BATTERY REMAINING LONGEVITY: 102 mo
MDC IDC MSMT LEADCHNL RV IMPEDANCE VALUE: 608 Ohm
MDC IDC MSMT LEADCHNL RV PACING THRESHOLD PULSEWIDTH: 0.4 ms
MDC IDC MSMT LEADCHNL RV SENSING INTR AMPL: 29.375 mV
MDC IDC PG IMPLANT DT: 20170126
MDC IDC SET LEADCHNL RV SENSING SENSITIVITY: 2 mV

## 2017-11-10 NOTE — Progress Notes (Signed)
Remote pacemaker transmission.   

## 2017-11-18 ENCOUNTER — Encounter: Payer: Self-pay | Admitting: Cardiology

## 2017-12-28 ENCOUNTER — Encounter: Payer: Self-pay | Admitting: Cardiology

## 2018-01-24 ENCOUNTER — Encounter: Payer: Medicare Other | Admitting: Cardiology

## 2018-02-04 IMAGING — CR DG LUMBAR SPINE COMPLETE 4+V
5 series · 5 of 5 positions shown · non-contrast
Comparison: None.

CLINICAL DATA: Chronic low back pain, no known injury, initial
encounter

EXAM:
LUMBAR SPINE - COMPLETE 4+ VIEW

[w lumbar spine ap]
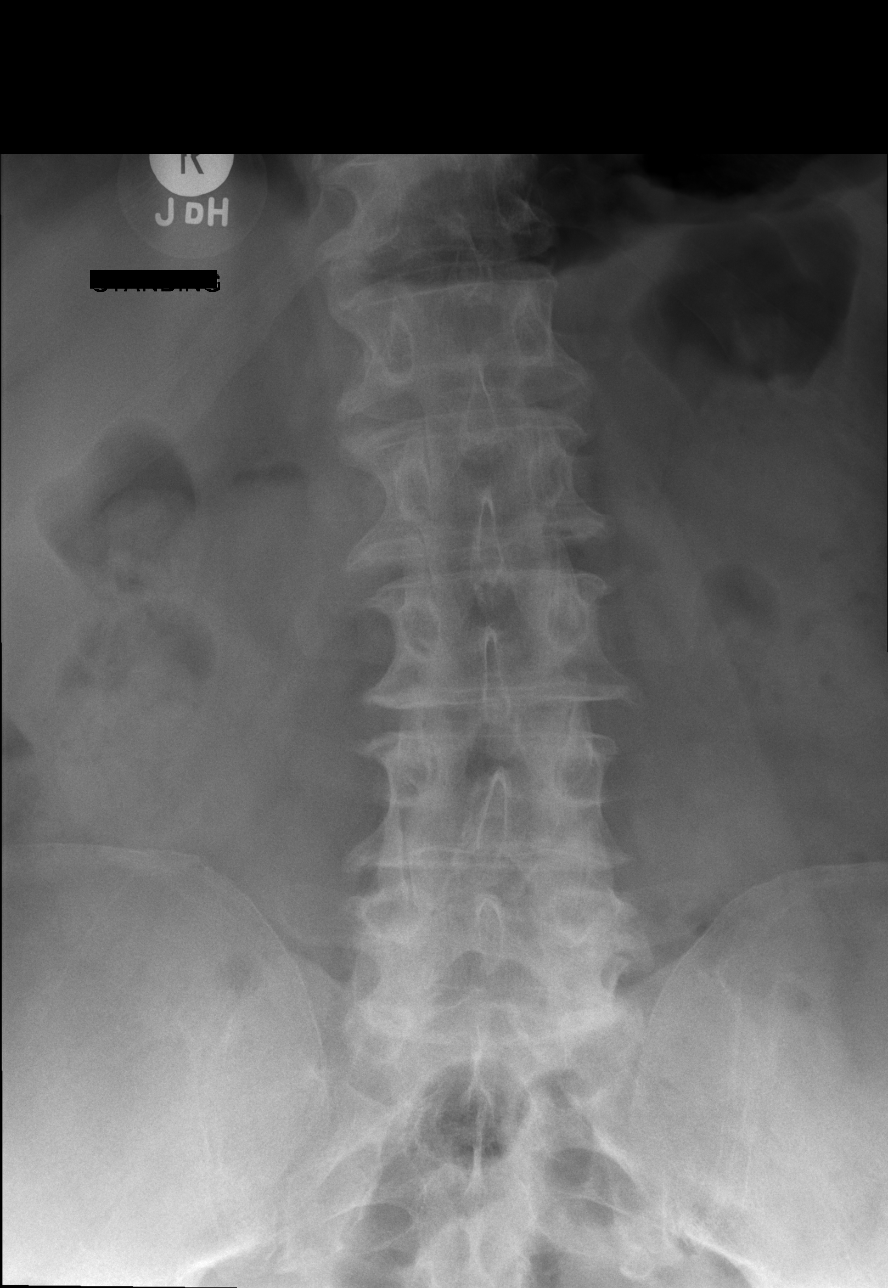

[w lumbar spine obl (1 of 2)]
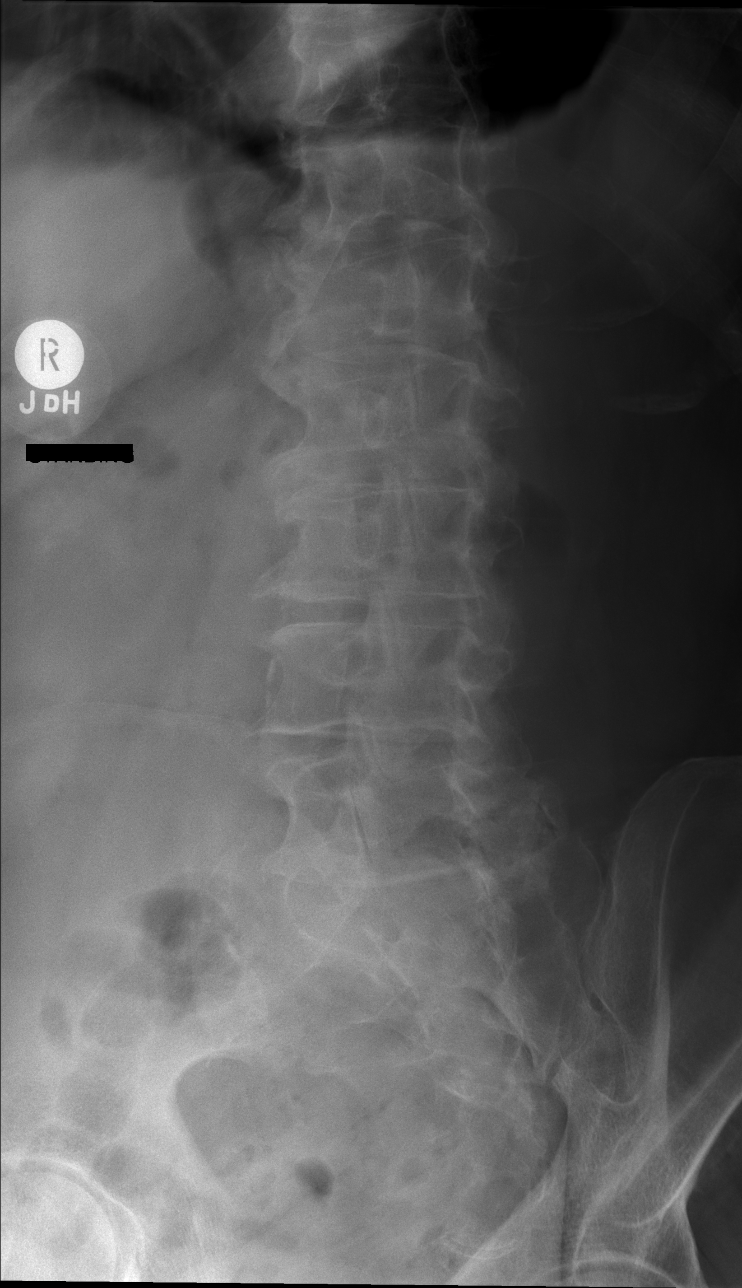

[w lumbar spine obl (2 of 2)]
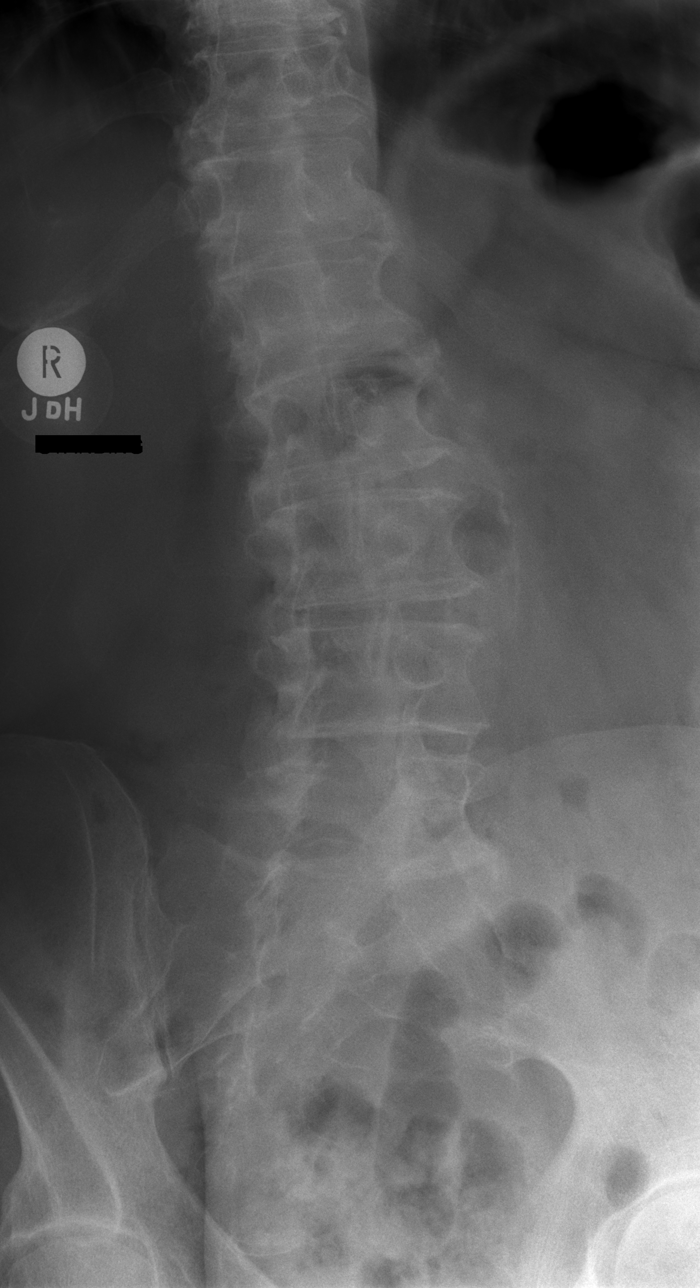

[w lumbar spine lat]
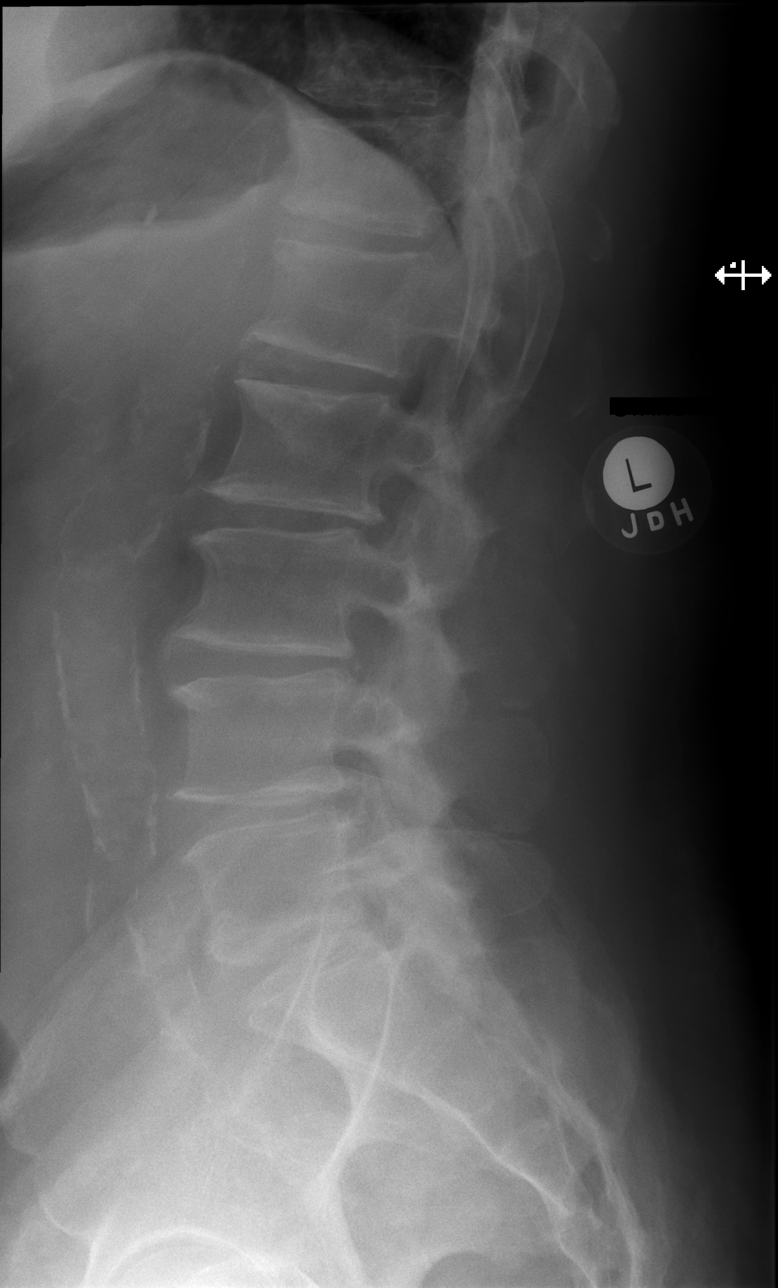

[w lumbar l-5 s-1 spot]
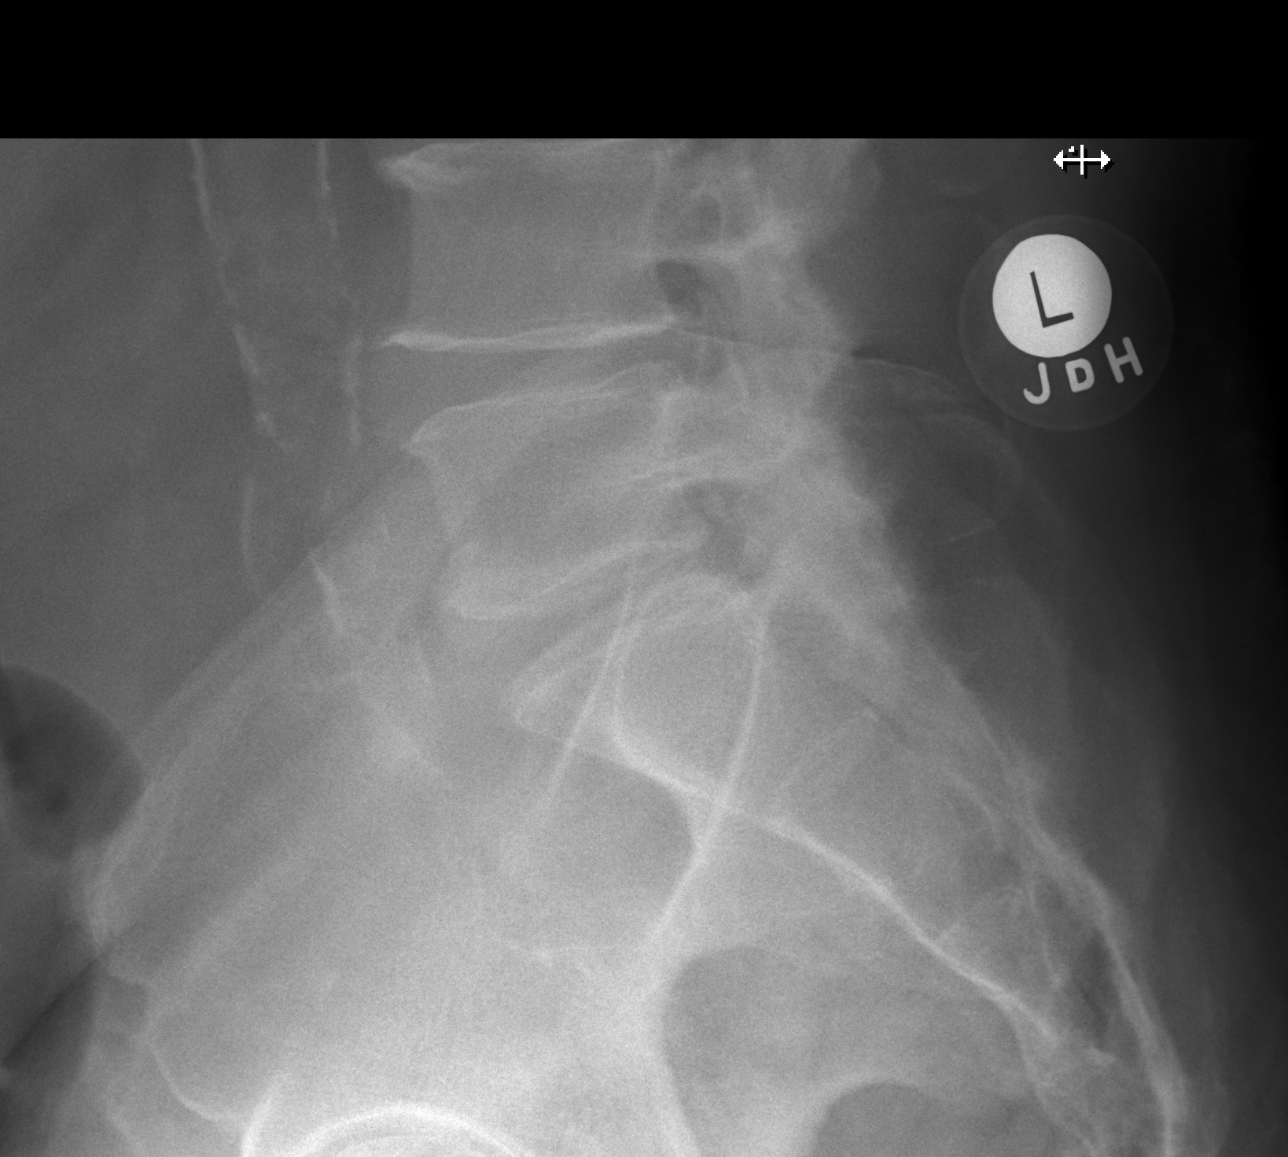

[5 of 5 positions shown; findings below may reference images not displayed]

FINDINGS: Five lumbar type vertebral bodies are well visualized. Multilevel
osteophytic changes are seen. Vertebral body height is well
maintained. No pars defects or anterolisthesis is seen. Facet
hypertrophic changes are noted. Diffuse aortic calcifications are
seen without aneurysmal dilatation. Multilevel disc space narrowing
is noted particularly at L2-3 and L4-5.
IMPRESSION: Multilevel degenerative change without acute abnormality.

## 2018-02-08 ENCOUNTER — Telehealth: Payer: Self-pay | Admitting: Cardiology

## 2018-02-08 ENCOUNTER — Ambulatory Visit (INDEPENDENT_AMBULATORY_CARE_PROVIDER_SITE_OTHER): Payer: Medicare Other | Admitting: *Deleted

## 2018-02-08 DIAGNOSIS — I495 Sick sinus syndrome: Secondary | ICD-10-CM | POA: Diagnosis not present

## 2018-02-08 NOTE — Telephone Encounter (Signed)
Spoke with pt and reminded pt of remote transmission that is due today. Pt verbalized understanding.   

## 2018-02-10 ENCOUNTER — Encounter: Payer: Self-pay | Admitting: Cardiology

## 2018-02-10 NOTE — Progress Notes (Signed)
Remote pacemaker transmission.   

## 2018-02-11 LAB — CUP PACEART REMOTE DEVICE CHECK
Battery Remaining Longevity: 106 mo
Battery Voltage: 3.02 V
Brady Statistic RV Percent Paced: 94.94 %
Date Time Interrogation Session: 20190220210706
Implantable Lead Implant Date: 20170126
Implantable Lead Location: 753860
Implantable Lead Model: 5076
Implantable Pulse Generator Implant Date: 20170126
Lead Channel Impedance Value: 456 Ohm
Lead Channel Impedance Value: 513 Ohm
Lead Channel Pacing Threshold Amplitude: 0.5 V
Lead Channel Pacing Threshold Pulse Width: 0.4 ms
Lead Channel Sensing Intrinsic Amplitude: 19.625 mV
Lead Channel Sensing Intrinsic Amplitude: 19.625 mV
Lead Channel Setting Pacing Amplitude: 2 V
Lead Channel Setting Pacing Pulse Width: 0.4 ms
Lead Channel Setting Sensing Sensitivity: 2 mV

## 2018-02-14 ENCOUNTER — Ambulatory Visit: Payer: Medicare Other | Admitting: Cardiology

## 2018-02-14 ENCOUNTER — Encounter: Payer: Medicare Other | Admitting: Cardiology

## 2018-02-14 ENCOUNTER — Encounter: Payer: Self-pay | Admitting: Cardiology

## 2018-02-14 VITALS — BP 140/80 | HR 80 | Ht 72.5 in | Wt 243.0 lb

## 2018-02-14 DIAGNOSIS — I10 Essential (primary) hypertension: Secondary | ICD-10-CM | POA: Diagnosis not present

## 2018-02-14 DIAGNOSIS — I482 Chronic atrial fibrillation, unspecified: Secondary | ICD-10-CM

## 2018-02-14 DIAGNOSIS — I441 Atrioventricular block, second degree: Secondary | ICD-10-CM

## 2018-02-14 DIAGNOSIS — E785 Hyperlipidemia, unspecified: Secondary | ICD-10-CM

## 2018-02-14 DIAGNOSIS — I25810 Atherosclerosis of coronary artery bypass graft(s) without angina pectoris: Secondary | ICD-10-CM | POA: Diagnosis not present

## 2018-02-14 LAB — CUP PACEART INCLINIC DEVICE CHECK
Battery Voltage: 3.02 V
Brady Statistic RV Percent Paced: 94.46 %
Lead Channel Impedance Value: 494 Ohm
Lead Channel Impedance Value: 570 Ohm
Lead Channel Pacing Threshold Amplitude: 0.5 V
Lead Channel Sensing Intrinsic Amplitude: 15.5 mV
Lead Channel Setting Pacing Pulse Width: 0.4 ms
Lead Channel Setting Sensing Sensitivity: 2 mV
MDC IDC LEAD IMPLANT DT: 20170126
MDC IDC LEAD LOCATION: 753860
MDC IDC MSMT BATTERY REMAINING LONGEVITY: 104 mo
MDC IDC MSMT LEADCHNL RV PACING THRESHOLD PULSEWIDTH: 0.4 ms
MDC IDC PG IMPLANT DT: 20170126
MDC IDC SESS DTM: 20190225174245
MDC IDC SET LEADCHNL RV PACING AMPLITUDE: 2 V

## 2018-02-14 NOTE — Patient Instructions (Signed)
Medication Instructions:  Your physician recommends that you continue on your current medications as directed. Please refer to the Current Medication list given to you today.  *If you need a refill on your cardiac medications before your next appointment, please call your pharmacy*  Labwork: None ordered  Testing/Procedures: None ordered  Follow-Up: Remote monitoring is used to monitor your Pacemaker or ICD from home. This monitoring reduces the number of office visits required to check your device to one time per year. It allows [place] to keep an eye on the functioning of your device to ensure it is working properly. You are scheduled for a device check from home on 05/10/2018. You may send your transmission at any time that day. If you have a wireless device, the transmission will be sent automatically. After your physician reviews your transmission, you will receive a postcard with your next transmission date.  Your physician wants you to follow-up in: 1 year with Dr. Curt Bears.  You will receive a reminder letter in the mail two months in advance. If you don't receive a letter, please call our office to schedule the follow-up appointment.  Thank you for choosing [facility]!!   Trinidad Curet, RN [phone]
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
## 2018-05-10 ENCOUNTER — Ambulatory Visit (INDEPENDENT_AMBULATORY_CARE_PROVIDER_SITE_OTHER): Payer: Medicare Other | Admitting: *Deleted

## 2018-05-10 ENCOUNTER — Telehealth: Payer: Self-pay | Admitting: Cardiology

## 2018-05-10 DIAGNOSIS — I495 Sick sinus syndrome: Secondary | ICD-10-CM

## 2018-05-10 NOTE — Telephone Encounter (Signed)
Spoke with pt and reminded pt of remote transmission that is due today. Pt verbalized understanding.   

## 2018-05-11 NOTE — Progress Notes (Signed)
Remote pacemaker transmission.   

## 2018-05-12 ENCOUNTER — Encounter: Payer: Self-pay | Admitting: Cardiology

## 2018-05-20 LAB — CUP PACEART REMOTE DEVICE CHECK
Battery Remaining Longevity: 99 mo
Battery Voltage: 3.02 V
Brady Statistic RV Percent Paced: 94.23 %
Implantable Pulse Generator Implant Date: 20170126
Lead Channel Impedance Value: 437 Ohm
Lead Channel Impedance Value: 494 Ohm
Lead Channel Pacing Threshold Amplitude: 0.5 V
Lead Channel Sensing Intrinsic Amplitude: 13 mV
Lead Channel Setting Pacing Pulse Width: 0.4 ms
Lead Channel Setting Sensing Sensitivity: 2 mV
MDC IDC LEAD IMPLANT DT: 20170126
MDC IDC LEAD LOCATION: 753860
MDC IDC MSMT LEADCHNL RV PACING THRESHOLD PULSEWIDTH: 0.4 ms
MDC IDC MSMT LEADCHNL RV SENSING INTR AMPL: 13 mV
MDC IDC SESS DTM: 20190521183843
MDC IDC SET LEADCHNL RV PACING AMPLITUDE: 2 V

## 2018-08-09 ENCOUNTER — Encounter: Payer: Medicare Other | Admitting: *Deleted

## 2018-08-09 ENCOUNTER — Telehealth: Payer: Self-pay | Admitting: Cardiology

## 2018-08-09 NOTE — Telephone Encounter (Signed)
LMOVM reminding pt to send remote transmission.   

## 2018-08-10 ENCOUNTER — Encounter: Payer: Self-pay | Admitting: Cardiology

## 2018-08-13 ENCOUNTER — Other Ambulatory Visit: Payer: Self-pay | Admitting: Interventional Cardiology

## 2018-08-17 ENCOUNTER — Ambulatory Visit (INDEPENDENT_AMBULATORY_CARE_PROVIDER_SITE_OTHER): Payer: Medicare Other | Admitting: *Deleted

## 2018-08-17 DIAGNOSIS — I495 Sick sinus syndrome: Secondary | ICD-10-CM

## 2018-08-17 DIAGNOSIS — I441 Atrioventricular block, second degree: Secondary | ICD-10-CM

## 2018-08-17 NOTE — Progress Notes (Signed)
Remote pacemaker transmission.   

## 2018-09-08 LAB — CUP PACEART REMOTE DEVICE CHECK
Battery Voltage: 3.02 V
Brady Statistic RV Percent Paced: 97.49 %
Date Time Interrogation Session: 20190828181255
Implantable Lead Implant Date: 20170126
Implantable Lead Location: 753860
Implantable Lead Model: 5076
Implantable Pulse Generator Implant Date: 20170126
Lead Channel Impedance Value: 437 Ohm
Lead Channel Pacing Threshold Amplitude: 0.5 V
Lead Channel Pacing Threshold Pulse Width: 0.4 ms
Lead Channel Setting Pacing Amplitude: 2 V
Lead Channel Setting Pacing Pulse Width: 0.4 ms
Lead Channel Setting Sensing Sensitivity: 2 mV
MDC IDC MSMT BATTERY REMAINING LONGEVITY: 96 mo
MDC IDC MSMT LEADCHNL RV IMPEDANCE VALUE: 494 Ohm
MDC IDC MSMT LEADCHNL RV SENSING INTR AMPL: 15.25 mV
MDC IDC MSMT LEADCHNL RV SENSING INTR AMPL: 15.25 mV

## 2018-10-04 ENCOUNTER — Other Ambulatory Visit: Payer: Self-pay | Admitting: Interventional Cardiology

## 2018-10-04 DIAGNOSIS — M25561 Pain in right knee: Secondary | ICD-10-CM | POA: Insufficient documentation

## 2018-10-04 DIAGNOSIS — M25562 Pain in left knee: Secondary | ICD-10-CM

## 2018-11-21 ENCOUNTER — Telehealth: Payer: Self-pay

## 2018-11-21 NOTE — Telephone Encounter (Signed)
Spoke with pt and reminded pt of remote transmission that is due today. Pt verbalized understanding.   

## 2018-11-24 ENCOUNTER — Encounter: Payer: Self-pay | Admitting: Cardiology

## 2018-11-25 ENCOUNTER — Ambulatory Visit (INDEPENDENT_AMBULATORY_CARE_PROVIDER_SITE_OTHER): Payer: Medicare Other

## 2018-11-25 DIAGNOSIS — I495 Sick sinus syndrome: Secondary | ICD-10-CM | POA: Diagnosis not present

## 2018-11-28 NOTE — Progress Notes (Signed)
Remote pacemaker transmission.   

## 2018-11-30 ENCOUNTER — Encounter: Payer: Self-pay | Admitting: Cardiology

## 2019-01-09 LAB — CUP PACEART REMOTE DEVICE CHECK
Date Time Interrogation Session: 20191206204806
Implantable Lead Implant Date: 20170126
Lead Channel Pacing Threshold Amplitude: 0.5 V
Lead Channel Pacing Threshold Pulse Width: 0.4 ms
Lead Channel Sensing Intrinsic Amplitude: 13.125 mV
Lead Channel Setting Pacing Pulse Width: 0.4 ms
MDC IDC LEAD LOCATION: 753860
MDC IDC MSMT BATTERY REMAINING LONGEVITY: 94 mo
MDC IDC MSMT BATTERY VOLTAGE: 3.02 V
MDC IDC MSMT LEADCHNL RV IMPEDANCE VALUE: 513 Ohm
MDC IDC MSMT LEADCHNL RV IMPEDANCE VALUE: 589 Ohm
MDC IDC MSMT LEADCHNL RV SENSING INTR AMPL: 13.125 mV
MDC IDC PG IMPLANT DT: 20170126
MDC IDC SET LEADCHNL RV PACING AMPLITUDE: 2 V
MDC IDC SET LEADCHNL RV SENSING SENSITIVITY: 2 mV
MDC IDC STAT BRADY RV PERCENT PACED: 98.68 %

## 2019-02-27 ENCOUNTER — Ambulatory Visit (INDEPENDENT_AMBULATORY_CARE_PROVIDER_SITE_OTHER): Payer: Medicare Other | Admitting: *Deleted

## 2019-02-27 DIAGNOSIS — I495 Sick sinus syndrome: Secondary | ICD-10-CM

## 2019-02-27 DIAGNOSIS — I441 Atrioventricular block, second degree: Secondary | ICD-10-CM | POA: Diagnosis not present

## 2019-02-28 LAB — CUP PACEART REMOTE DEVICE CHECK
Battery Remaining Longevity: 93 mo
Battery Voltage: 3.02 V
Brady Statistic RV Percent Paced: 98.01 %
Date Time Interrogation Session: 20200309223430
Implantable Lead Location: 753860
Implantable Lead Model: 5076
Implantable Pulse Generator Implant Date: 20170126
Lead Channel Impedance Value: 418 Ohm
Lead Channel Impedance Value: 475 Ohm
Lead Channel Pacing Threshold Pulse Width: 0.4 ms
Lead Channel Sensing Intrinsic Amplitude: 31.625 mV
Lead Channel Sensing Intrinsic Amplitude: 31.625 mV
Lead Channel Setting Pacing Amplitude: 2 V
Lead Channel Setting Pacing Pulse Width: 0.4 ms
Lead Channel Setting Sensing Sensitivity: 2 mV
MDC IDC LEAD IMPLANT DT: 20170126
MDC IDC MSMT LEADCHNL RV PACING THRESHOLD AMPLITUDE: 0.5 V

## 2019-03-06 NOTE — Progress Notes (Signed)
Remote pacemaker transmission.   

## 2019-03-09 ENCOUNTER — Encounter: Payer: Self-pay | Admitting: Cardiology

## 2019-03-09 ENCOUNTER — Other Ambulatory Visit: Payer: Self-pay | Admitting: *Deleted

## 2019-03-17 ENCOUNTER — Other Ambulatory Visit: Payer: Self-pay

## 2019-03-17 ENCOUNTER — Telehealth (INDEPENDENT_AMBULATORY_CARE_PROVIDER_SITE_OTHER): Payer: Medicare Other | Admitting: Cardiology

## 2019-03-17 ENCOUNTER — Encounter: Payer: Self-pay | Admitting: Cardiology

## 2019-03-17 DIAGNOSIS — Z950 Presence of cardiac pacemaker: Secondary | ICD-10-CM
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
## 2019-03-17 NOTE — Patient Instructions (Signed)
Medication Instructions:  Your physician recommends that you continue on your current medications as directed. Please refer to the Current Medication list given to you today.  *If you need a refill on your cardiac medications before your next appointment, please call your pharmacy*  Labwork: None ordered  Testing/Procedures: None ordered  Follow-Up: Remote monitoring is used to monitor your Pacemaker or ICD from home. This monitoring reduces the number of office visits required to check your device to one time per year. It allows [place] to keep an eye on the functioning of your device to ensure it is working properly. You are scheduled for a device check from home on 05/29/2019. You may send your transmission at any time that day. If you have a wireless device, the transmission will be sent automatically. After your physician reviews your transmission, you will receive a postcard with your next transmission date.  Your physician wants you to follow-up in: 1 year with Dr. Curt Bears.  You will receive a reminder letter in the mail two months in advance. If you don't receive a letter, please call our office to schedule the follow-up appointment.  Thank you for choosing [facility]!!   Trinidad Curet, RN [phone]

## 2019-04-06 ENCOUNTER — Telehealth: Payer: Self-pay | Admitting: Interventional Cardiology

## 2019-04-06 NOTE — Telephone Encounter (Signed)
Spoke with the pt, he thought his APPT was tomorrow with Dr. Tamala Julian. I advised him it is not until 04/14/2019. I advised him that Dr. Tamala Julian is doing virtual visits and he was upset and said that he already tried that with Dr. Curt Bears. He says he has a smartphone but it took 2 days of phone calls and he still could not make it work. He could not tell me if it was Webex or Doximity. I advised him that he will be getting a phone call because his appt date and time will probably be altered. Pt says he is very anxious because he likes having an EKG, but has been having his regular pacer checks.   Will forward to Dr. Thompson Caul nurse Anderson Malta, and pt is aware that he will be having another phone call regarding the upcoming appt.

## 2019-04-06 NOTE — Telephone Encounter (Signed)
New message;    Patient calling concering appt.please call patient.

## 2019-04-10 NOTE — Telephone Encounter (Signed)
Spoke with pt and advised that we are not seeing pts in the office right now due to the virus.  Advised we could do virtual visit.  Pt does not want to do that.  States he did one with Dr. Curt Bears the other day and doesn't see the point in them.  Advised pt I can reschedule for August if he is doing ok and prefers to be seen in person.  Pt agreeable.  Rescheduled pt for 08/08/19.  Advised to call sooner if any issues.

## 2019-04-14 ENCOUNTER — Ambulatory Visit: Payer: Medicare Other | Admitting: Interventional Cardiology

## 2019-05-29 ENCOUNTER — Ambulatory Visit (INDEPENDENT_AMBULATORY_CARE_PROVIDER_SITE_OTHER): Payer: Medicare Other | Admitting: *Deleted

## 2019-05-29 DIAGNOSIS — I495 Sick sinus syndrome: Secondary | ICD-10-CM | POA: Diagnosis not present

## 2019-05-29 DIAGNOSIS — I441 Atrioventricular block, second degree: Secondary | ICD-10-CM

## 2019-05-30 ENCOUNTER — Telehealth: Payer: Self-pay

## 2019-05-30 NOTE — Telephone Encounter (Signed)
Spoke with patient to remind of missed remote transmission 

## 2019-05-31 LAB — CUP PACEART REMOTE DEVICE CHECK
Battery Remaining Longevity: 92 mo
Battery Voltage: 3.02 V
Brady Statistic RV Percent Paced: 97.95 %
Date Time Interrogation Session: 20200609220749
Implantable Lead Implant Date: 20170126
Implantable Lead Location: 753860
Implantable Lead Model: 5076
Implantable Pulse Generator Implant Date: 20170126
Lead Channel Impedance Value: 418 Ohm
Lead Channel Impedance Value: 494 Ohm
Lead Channel Pacing Threshold Amplitude: 0.625 V
Lead Channel Pacing Threshold Pulse Width: 0.4 ms
Lead Channel Sensing Intrinsic Amplitude: 31.375 mV
Lead Channel Sensing Intrinsic Amplitude: 31.375 mV
Lead Channel Setting Pacing Amplitude: 2 V
Lead Channel Setting Pacing Pulse Width: 0.4 ms
Lead Channel Setting Sensing Sensitivity: 2 mV

## 2019-06-05 NOTE — Progress Notes (Signed)
Remote pacemaker transmission.   

## 2019-07-21 ENCOUNTER — Telehealth: Payer: Self-pay

## 2019-07-21 NOTE — Telephone Encounter (Signed)
Called pt to switch his 8/18 OV to a VV but the pt declined. He only wanted to do an In-Person visit. I explained to him that the 1st available was in October and he stated that would be fine. I moved his appt to Oct 14.

## 2019-08-08 ENCOUNTER — Ambulatory Visit: Payer: Medicare Other | Admitting: Interventional Cardiology

## 2019-08-29 ENCOUNTER — Ambulatory Visit (INDEPENDENT_AMBULATORY_CARE_PROVIDER_SITE_OTHER): Payer: Medicare Other | Admitting: *Deleted

## 2019-08-29 DIAGNOSIS — I495 Sick sinus syndrome: Secondary | ICD-10-CM

## 2019-08-30 LAB — CUP PACEART REMOTE DEVICE CHECK
Battery Remaining Longevity: 91 mo
Battery Voltage: 3.02 V
Brady Statistic RV Percent Paced: 96.87 %
Date Time Interrogation Session: 20200909160126
Implantable Lead Implant Date: 20170126
Implantable Lead Location: 753860
Implantable Lead Model: 5076
Implantable Pulse Generator Implant Date: 20170126
Lead Channel Impedance Value: 418 Ohm
Lead Channel Impedance Value: 475 Ohm
Lead Channel Pacing Threshold Amplitude: 0.5 V
Lead Channel Pacing Threshold Pulse Width: 0.4 ms
Lead Channel Sensing Intrinsic Amplitude: 11.5 mV
Lead Channel Sensing Intrinsic Amplitude: 11.5 mV
Lead Channel Setting Pacing Amplitude: 2 V
Lead Channel Setting Pacing Pulse Width: 0.4 ms
Lead Channel Setting Sensing Sensitivity: 2 mV

## 2019-09-13 ENCOUNTER — Encounter: Payer: Self-pay | Admitting: Cardiology

## 2019-09-13 NOTE — Progress Notes (Signed)
Remote pacemaker transmission.   
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
## 2019-10-04 ENCOUNTER — Other Ambulatory Visit: Payer: Self-pay

## 2019-10-04 ENCOUNTER — Ambulatory Visit: Payer: Medicare Other | Admitting: Interventional Cardiology

## 2019-10-04 ENCOUNTER — Encounter: Payer: Self-pay | Admitting: Interventional Cardiology

## 2019-10-04 VITALS — BP 114/78 | HR 66 | Ht 72.05 in | Wt 239.0 lb

## 2019-10-04 DIAGNOSIS — I5042 Chronic combined systolic (congestive) and diastolic (congestive) heart failure: Secondary | ICD-10-CM | POA: Diagnosis not present

## 2019-10-04 DIAGNOSIS — I482 Chronic atrial fibrillation, unspecified: Secondary | ICD-10-CM | POA: Diagnosis not present

## 2019-10-04 DIAGNOSIS — E785 Hyperlipidemia, unspecified: Secondary | ICD-10-CM | POA: Diagnosis not present

## 2019-10-04 DIAGNOSIS — I495 Sick sinus syndrome: Secondary | ICD-10-CM

## 2019-10-04 DIAGNOSIS — I25709 Atherosclerosis of coronary artery bypass graft(s), unspecified, with unspecified angina pectoris: Secondary | ICD-10-CM | POA: Diagnosis not present

## 2019-10-04 DIAGNOSIS — Z950 Presence of cardiac pacemaker: Secondary | ICD-10-CM

## 2019-10-04 DIAGNOSIS — Z7189 Other specified counseling: Secondary | ICD-10-CM

## 2019-10-04 DIAGNOSIS — I10 Essential (primary) hypertension: Secondary | ICD-10-CM

## 2019-10-04 NOTE — Patient Instructions (Signed)

## 2019-10-22 ENCOUNTER — Emergency Department (HOSPITAL_COMMUNITY): Payer: Medicare Other

## 2019-10-22 ENCOUNTER — Encounter (HOSPITAL_COMMUNITY): Payer: Self-pay | Admitting: Emergency Medicine

## 2019-10-22 ENCOUNTER — Emergency Department (HOSPITAL_COMMUNITY)
Admission: EM | Admit: 2019-10-22 | Discharge: 2019-10-22 | Disposition: A | Discharge status: Home / Self Care | Payer: Medicare Other | Attending: Emergency Medicine | Admitting: Emergency Medicine

## 2019-10-22 ENCOUNTER — Other Ambulatory Visit: Payer: Self-pay

## 2019-10-22 DIAGNOSIS — Z7901 Long term (current) use of anticoagulants: Secondary | ICD-10-CM | POA: Insufficient documentation

## 2019-10-22 DIAGNOSIS — R519 Headache, unspecified: Secondary | ICD-10-CM | POA: Diagnosis not present

## 2019-10-22 DIAGNOSIS — J40 Bronchitis, not specified as acute or chronic: Secondary | ICD-10-CM | POA: Insufficient documentation

## 2019-10-22 DIAGNOSIS — R509 Fever, unspecified: Secondary | ICD-10-CM | POA: Diagnosis present

## 2019-10-22 DIAGNOSIS — J449 Chronic obstructive pulmonary disease, unspecified: Secondary | ICD-10-CM | POA: Diagnosis not present

## 2019-10-22 DIAGNOSIS — E119 Type 2 diabetes mellitus without complications: Secondary | ICD-10-CM | POA: Diagnosis not present

## 2019-10-22 DIAGNOSIS — Z950 Presence of cardiac pacemaker: Secondary | ICD-10-CM | POA: Insufficient documentation

## 2019-10-22 DIAGNOSIS — Z20822 Contact with and (suspected) exposure to covid-19: Secondary | ICD-10-CM

## 2019-10-22 DIAGNOSIS — I10 Essential (primary) hypertension: Secondary | ICD-10-CM | POA: Diagnosis not present

## 2019-10-22 DIAGNOSIS — Z79899 Other long term (current) drug therapy: Secondary | ICD-10-CM | POA: Insufficient documentation

## 2019-10-22 DIAGNOSIS — F17210 Nicotine dependence, cigarettes, uncomplicated: Secondary | ICD-10-CM | POA: Diagnosis not present

## 2019-10-22 DIAGNOSIS — I4891 Unspecified atrial fibrillation: Secondary | ICD-10-CM | POA: Diagnosis not present

## 2019-10-22 DIAGNOSIS — Z20828 Contact with and (suspected) exposure to other viral communicable diseases: Secondary | ICD-10-CM | POA: Insufficient documentation

## 2019-10-22 HISTORY — DX: Chronic obstructive pulmonary disease, unspecified: J44.9

## 2019-10-22 LAB — BASIC METABOLIC PANEL
Anion gap: 10 (ref 5–15)
BUN: 15 mg/dL (ref 8–23)
CO2: 24 mmol/L (ref 22–32)
Calcium: 10.7 mg/dL — ABNORMAL HIGH (ref 8.9–10.3)
Chloride: 106 mmol/L (ref 98–111)
Creatinine, Ser: 1.12 mg/dL (ref 0.61–1.24)
GFR calc Af Amer: >60 mL/min (ref >60)
GFR calc non Af Amer: >60 mL/min (ref >60)
Glucose, Bld: 108 mg/dL — ABNORMAL HIGH (ref 70–99)
Potassium: 4.3 mmol/L (ref 3.5–5.1)
Sodium: 140 mmol/L (ref 135–145)

## 2019-10-22 LAB — SARS CORONAVIRUS 2 (TAT 6-24 HRS): SARS Coronavirus 2: NEGATIVE

## 2019-10-22 LAB — CBC
HCT: 44.2 % (ref 39.0–52.0)
Hemoglobin: 14.6 g/dL (ref 13.0–17.0)
MCH: 31.2 pg (ref 26.0–34.0)
MCHC: 33 g/dL (ref 30.0–36.0)
MCV: 94.4 fL (ref 80.0–100.0)
Platelets: 227 10*3/uL (ref 150–400)
RBC: 4.68 MIL/uL (ref 4.22–5.81)
RDW: 12.6 % (ref 11.5–15.5)
WBC: 12 10*3/uL — ABNORMAL HIGH (ref 4.0–10.5)
nRBC: 0 % (ref 0.0–0.2)

## 2019-10-22 MED ORDER — BENZONATATE 100 MG PO CAPS: 100.0000 mg | ORAL_CAPSULE | Freq: Three times a day (TID) | ORAL | 0 refills | Status: DC

## 2019-10-22 MED ORDER — DOXYCYCLINE HYCLATE 100 MG PO CAPS: 100.0000 mg | ORAL_CAPSULE | Freq: Two times a day (BID) | ORAL | 0 refills | Status: DC

## 2019-10-22 MED ORDER — BENZONATATE 100 MG PO CAPS
100.0000 mg | ORAL_CAPSULE | Freq: Once | ORAL | Status: AC
  Administered 2019-10-22: 14:00:00 100 mg via ORAL
  Filled 2019-10-22: qty 1

## 2019-10-22 MED ORDER — ALBUTEROL SULFATE HFA 108 (90 BASE) MCG/ACT IN AERS
4.0000 | INHALATION_SPRAY | RESPIRATORY_TRACT | Status: DC | PRN
  Administered 2019-10-22: 4 via RESPIRATORY_TRACT
  Filled 2019-10-22: qty 6.7

## 2019-10-22 MED ORDER — AEROCHAMBER PLUS FLO-VU LARGE MISC
Status: AC
  Administered 2019-10-22: 14:00:00
  Filled 2019-10-22: qty 1

## 2019-10-22 MED ORDER — PREDNISONE 20 MG PO TABS: ORAL_TABLET | ORAL | 0 refills | Status: DC

## 2019-10-22 MED ORDER — ACETAMINOPHEN 325 MG PO TABS
650.0000 mg | ORAL_TABLET | Freq: Once | ORAL | Status: AC
  Administered 2019-10-22: 650 mg via ORAL
  Filled 2019-10-22: qty 2

## 2019-10-22 NOTE — ED Triage Notes (Signed)
PT sneezing and coughing on arrival to room 18. PT reports he no fever at home ,in triage Pt fever 100.5 . Pt reported he had on multiple coats  On arrival to room 18 Oral temp was 99.5.

## 2019-10-22 NOTE — ED Notes (Addendum)
Please call wife [phone] when ready for D/c or other issues. [employer]
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
## 2019-10-22 NOTE — Discharge Instructions (Signed)
Your symptoms is likely due to bronchitis.  Take albuterol inhaler 2-4 puffs every 4 hours as needed for shortness of breath.  Take prednisone as prescribed.  Tessalon Perles for cough as needed.  You have been tested for covid-19.  Follow instruction below.  You will be notify if you tested positive for covid-19 infection.  Return if you have any concerns, especially if you experience shortness of breath.

## 2019-10-22 NOTE — ED Notes (Signed)
Patient verbalizes understanding of discharge instructions . Opportunity for questions and answers were provided . Armband removed by staff ,Pt discharged from ED. W/C  offered at D/C  and Declined W/C at D/C and was escorted to lobby by RN.  

## 2019-10-22 NOTE — ED Triage Notes (Signed)
C/o headache, sore throat, non-productive cough, SOB, and fever since this morning.

## 2019-11-28 ENCOUNTER — Ambulatory Visit (INDEPENDENT_AMBULATORY_CARE_PROVIDER_SITE_OTHER): Payer: Medicare Other | Admitting: *Deleted

## 2019-11-28 DIAGNOSIS — Z950 Presence of cardiac pacemaker: Secondary | ICD-10-CM | POA: Diagnosis not present

## 2019-11-29 LAB — CUP PACEART REMOTE DEVICE CHECK
Battery Remaining Longevity: 91 mo
Battery Voltage: 3.02 V
Brady Statistic RV Percent Paced: 97.03 %
Date Time Interrogation Session: 20201209135156
Implantable Lead Implant Date: 20170126
Implantable Lead Location: 753860
Implantable Lead Model: 5076
Implantable Pulse Generator Implant Date: 20170126
Lead Channel Impedance Value: 475 Ohm
Lead Channel Impedance Value: 532 Ohm
Lead Channel Pacing Threshold Amplitude: 0.5 V
Lead Channel Pacing Threshold Pulse Width: 0.4 ms
Lead Channel Sensing Intrinsic Amplitude: 30.625 mV
Lead Channel Sensing Intrinsic Amplitude: 30.625 mV
Lead Channel Setting Pacing Amplitude: 2 V
Lead Channel Setting Pacing Pulse Width: 0.4 ms
Lead Channel Setting Sensing Sensitivity: 2 mV

## 2020-01-29 ENCOUNTER — Ambulatory Visit: Payer: Medicare Other | Attending: Internal Medicine

## 2020-01-29 DIAGNOSIS — Z23 Encounter for immunization: Secondary | ICD-10-CM

## 2020-01-29 NOTE — Progress Notes (Signed)
   Covid-19 Vaccination Clinic  Name:  Ross [name]    MRN: [id number] DOB: 13-Jan-1956  01/29/2020  Mr. Ross [name] was observed post Covid-19 immunization for 15 minutes without incidence. He was provided with Vaccine Information Sheet and instruction to access the V-Safe system.   Mr. Ross [name] was instructed to call 911 with any severe reactions post vaccine: Marland Kitchen Difficulty breathing  . Swelling of your face and throat  . A fast heartbeat  . A bad rash all over your body  . Dizziness and weakness    Immunizations Administered    Name Date Dose VIS Date Route   Pfizer COVID-19 Vaccine 01/29/2020  2:15 PM 0.3 mL 12/01/2019 Intramuscular   Manufacturer: [employer]   Lot: [id number]   [place]: [id number]

## 2020-02-23 ENCOUNTER — Ambulatory Visit: Payer: Medicare Other | Attending: Internal Medicine

## 2020-02-23 DIAGNOSIS — Z23 Encounter for immunization: Secondary | ICD-10-CM | POA: Insufficient documentation

## 2020-02-23 NOTE — Progress Notes (Signed)
   Covid-19 Vaccination Clinic  Name:  Ross [name]    MRN: [id number] DOB: 03-29-1956  02/23/2020  [name] was observed post Covid-19 immunization for 15 minutes without incident. He was provided with Vaccine Information Sheet and instruction to access the V-Safe system.   [name] was instructed to call 911 with any severe reactions post vaccine: Marland Kitchen Difficulty breathing  . Swelling of face and throat  . A fast heartbeat  . A bad rash all over body  . Dizziness and weakness   Immunizations Administered    Name Date Dose VIS Date Route   Pfizer COVID-19 Vaccine 02/23/2020  1:02 AM 0.3 mL 12/01/2019 Intramuscular   Manufacturer: [place]   Lot: [id number]   [place]: [id number]

## 2020-02-27 ENCOUNTER — Ambulatory Visit (INDEPENDENT_AMBULATORY_CARE_PROVIDER_SITE_OTHER): Payer: Medicare Other | Admitting: *Deleted

## 2020-02-27 DIAGNOSIS — Z950 Presence of cardiac pacemaker: Secondary | ICD-10-CM | POA: Diagnosis not present

## 2020-02-29 ENCOUNTER — Telehealth: Payer: Self-pay

## 2020-02-29 LAB — CUP PACEART REMOTE DEVICE CHECK
Battery Remaining Longevity: 91 mo
Battery Voltage: 3.01 V
Brady Statistic RV Percent Paced: 97.12 %
Date Time Interrogation Session: 20210311145223
Implantable Lead Implant Date: 20170126
Implantable Lead Location: 753860
Implantable Lead Model: 5076
Implantable Pulse Generator Implant Date: 20170126
Lead Channel Impedance Value: 418 Ohm
Lead Channel Impedance Value: 494 Ohm
Lead Channel Pacing Threshold Amplitude: 0.625 V
Lead Channel Pacing Threshold Pulse Width: 0.4 ms
Lead Channel Sensing Intrinsic Amplitude: 29.25 mV
Lead Channel Sensing Intrinsic Amplitude: 29.25 mV
Lead Channel Setting Pacing Amplitude: 2 V
Lead Channel Setting Pacing Pulse Width: 0.4 ms
Lead Channel Setting Sensing Sensitivity: 2 mV

## 2020-02-29 NOTE — Telephone Encounter (Signed)
   [facility] Pre-operative Risk Assessment    Request for surgical clearance:  1. What type of surgery is being performed? COLONSCOPY   2. When is this surgery scheduled? 03/21/20   3. What type of clearance is required (medical clearance vs. Pharmacy clearance to hold med vs. Both)? PHARAMCY  4. Are there any medications that need to be held prior to surgery and how long? ELIQUIS PLEASE ADVISE HOW LONG TO HOLD   5. Practice name and name of physician performing surgery? [facility]; DR. Alessandra Bevels   6. What is your office phone number [phone]    7.   What is your office fax number [phone]  8.   Anesthesia type (None, local, MAC, general) ? PROPOFOL    Jacinta Shoe 02/29/2020, 2:28 PM  _________________________________________________________________   (provider comments below)

## 2020-02-29 NOTE — Telephone Encounter (Addendum)
Patient with diagnosis of atrial fibrillation on Eliquis for anticoagulation.    Procedure: colonoscopy Date of procedure: 03/21/2020  CHADS2-VASc score of 5 (CHF, HTN, AGE, DM2, CAD)  CrCl >60 Platelet count 227  Per office protocol, patient can hold Eliquis for 1 day prior to procedure.     Clearance reviewed by pharmD-  Ramond Dial, Pharm.D, BCPS, CPP [place]  [address] [place], [place] [address]  Phone: [phone]; Fax: [phone]

## 2020-02-29 NOTE — Progress Notes (Signed)
PPM Remote  

## 2020-03-01 NOTE — Telephone Encounter (Signed)
Our office received a fax today with a message stating "Could we please get an answer on this". This is in regards to a pre op clearance. Clearance request was faxed to our office yesterday 02/29/20. Clearance was assessed and faxed back this morning at 9:33 am per Daune Perch, NP. I tried to call [facility] to let them know clearance was faxed this morning and was on hold waiting for someone to answer the phone when I then got a message at 4:58 pm to please leave a message. I will call their office on MOnday to confirm the clearance fax was received. I s/w pre op provider today Daune Perch, NP and while I was waiting to s/w someone she had re-faxed the clearance note.

## 2020-03-01 NOTE — Telephone Encounter (Signed)
   Primary Cardiologist: Sinclair Grooms, MD  Chart reviewed as part of pre-operative protocol coverage.   Patient with diagnosis of atrial fibrillation on Eliquis for anticoagulation.    Procedure: colonoscopy Date of procedure: 03/21/2020  CHADS2-VASc score of 5 (CHF, HTN, AGE, DM2, CAD) CrCl >60 Platelet count 227  Per office protocol, patient can hold Eliquis for 1 day prior to procedure.    I faxed this information to requesting provider this morning at 09:33.  I will fax recommendation to the requesting party again via Epic fax function and remove from pre-op pool.  Please call with questions.  Daune Perch, NP 03/01/2020, 4:53 PM

## 2020-03-04 NOTE — Telephone Encounter (Signed)
Left voice mail to call back for medical clearance.

## 2020-03-04 NOTE — Telephone Encounter (Signed)
I s/w Sherri today at [facility] in regards to clearance. I did state that our office did send clearance over 03/01/20. Sherri states that Dr. Alessandra Bevels saw the clearance for the holding the Eliquis though he wants to know is the pt actually cleared to have the colonoscopy. I confirmed with Sherri that Dr. Alessandra Bevels needs actual clearance to state pt is cleared for colonoscopy from a cardiac standpoint as well, Sherri confirms this by answering yes. I informed Sherri that I will let our pre op team know and will send amended clearance note. I will send clearance back to our pre op team.

## 2020-03-08 NOTE — Telephone Encounter (Signed)
I called the patient for cardiac clearance- left message to call back.  Kerin Ransom PA-C 03/08/2020 8:50 AM

## 2020-03-11 NOTE — Telephone Encounter (Signed)
Dr. Tamala Julian,  Ross [name] is scheduled for a routine colonoscopy on 03/21/2020. He has a history of CABG x4 in 2007. Most recent cath in 02/2017 showed occluded SVG to distal RVA and occluded SVG to CX but patent LIMA to LAD and SVG to Diagonal. He also has a history of chronic atrial fibrillation s/p PPM. You last saw him on 10/04/2019 at which reported chronic chest pain that was stable. I called to check on the patient today and reports gradual worsening shortness of breath over the last several months at rest and with exertion. He is not very active and states he only walks when his girlfriend makes him. He did recently walk 2 miles and notes some shortness of breath with this. He states he had to stop 3 times but that this was more due to knee pain than shortness of breath. He states he is able to lay flat at night. No lower extremity edema. He continues to have his chronic chest pain but states this is stable. No palpitations, lightheadedness/dizziness, or syncope. He is very concerned about having a colonoscopy because he states he was previously told around the time of his bypass surgery that he should never be "put to sleep again." I am not sure exactly why and patient cannot remember. I suspect his dyspnea is multifactorial given COPD, significant smoking history, and deconditioning. However, given his history and patient's concern about the procedure, I wanted to check with you. Do you feel like any additional cardiac work-up is needed at this time?  Please route response back to P [facility] PREOP.  Pre-op team will then update patient with your recommendations.  Thank you!

## 2020-03-12 NOTE — Telephone Encounter (Signed)
No additional wu needed

## 2020-03-12 NOTE — Telephone Encounter (Signed)
   Primary Cardiologist: Sinclair Grooms, MD  Chart reviewed as part of pre-operative protocol coverage. Patient was last seen by Dr. Tamala Julian in 09/2019 at which he noted chronic chest pain that was stable. I called patient on 03/11/2020 for pre-op evaluation, and he reported gradually worsening shortness of breath over the last several months but recently able to walk 2 miles. He was otherwise stable from a cardiac standpoint. I felt like patient's dyspnea was likely multifactorial given COPD, significant smoking history, and deconditioning. However, given patient's history, I did discuss with Dr. Tamala Julian. Dr. Tamala Julian does not feel like any additional cardiovascular testing is needed prior to colonoscopy.   Per office protocol, patient can hold Eliquis for 1 day prior to procedure. This should be resumed as soon as possible following colonoscopy.   I will route this recommendation to the requesting party via Epic fax function and remove from pre-op pool.  Please call with questions.  Darreld Mclean, PA-C 03/12/2020, 4:29 PM

## 2020-03-12 NOTE — Telephone Encounter (Signed)
Called to inform patient of Dr. Thompson Caul recommendation (no further work-up needed - OK for colonoscopy) but got voice mail. Left message for patient to call back and ask to speak with pre-op team. Will go ahead and fax clearance form back to requesting surgeon's office.

## 2020-03-28 ENCOUNTER — Other Ambulatory Visit: Payer: Self-pay | Admitting: Gastroenterology

## 2020-04-24 ENCOUNTER — Other Ambulatory Visit: Payer: Self-pay | Admitting: Family Medicine

## 2020-04-24 DIAGNOSIS — Z720 Tobacco use: Secondary | ICD-10-CM

## 2020-05-06 ENCOUNTER — Other Ambulatory Visit (HOSPITAL_COMMUNITY)
Admission: RE | Admit: 2020-05-06 | Discharge: 2020-05-06 | Disposition: A | Discharge status: Home / Self Care | Payer: Medicare Other | Source: Ambulatory Visit | Attending: Gastroenterology | Admitting: Gastroenterology

## 2020-05-06 DIAGNOSIS — Z01812 Encounter for preprocedural laboratory examination: Secondary | ICD-10-CM | POA: Insufficient documentation

## 2020-05-06 DIAGNOSIS — Z20822 Contact with and (suspected) exposure to covid-19: Secondary | ICD-10-CM | POA: Insufficient documentation

## 2020-05-06 LAB — SARS CORONAVIRUS 2 (TAT 6-24 HRS): SARS Coronavirus 2: NEGATIVE

## 2020-05-09 ENCOUNTER — Ambulatory Visit (HOSPITAL_COMMUNITY)
Admission: RE | Admit: 2020-05-09 | Discharge: 2020-05-09 | Disposition: A | Discharge status: Home / Self Care | Payer: Medicare Other | Attending: Gastroenterology | Admitting: Gastroenterology

## 2020-05-09 ENCOUNTER — Ambulatory Visit (HOSPITAL_COMMUNITY): Payer: Medicare Other | Admitting: Certified Registered Nurse Anesthetist

## 2020-05-09 ENCOUNTER — Encounter (HOSPITAL_COMMUNITY)
Admission: RE | Disposition: A | Discharge status: Home / Self Care | Payer: Self-pay | Source: Home / Self Care | Attending: Gastroenterology

## 2020-05-09 DIAGNOSIS — M1990 Unspecified osteoarthritis, unspecified site: Secondary | ICD-10-CM | POA: Insufficient documentation

## 2020-05-09 DIAGNOSIS — D125 Benign neoplasm of sigmoid colon: Secondary | ICD-10-CM | POA: Insufficient documentation

## 2020-05-09 DIAGNOSIS — K648 Other hemorrhoids: Secondary | ICD-10-CM | POA: Diagnosis not present

## 2020-05-09 DIAGNOSIS — J449 Chronic obstructive pulmonary disease, unspecified: Secondary | ICD-10-CM | POA: Diagnosis not present

## 2020-05-09 DIAGNOSIS — I509 Heart failure, unspecified: Secondary | ICD-10-CM | POA: Insufficient documentation

## 2020-05-09 DIAGNOSIS — D123 Benign neoplasm of transverse colon: Secondary | ICD-10-CM | POA: Insufficient documentation

## 2020-05-09 DIAGNOSIS — Z1211 Encounter for screening for malignant neoplasm of colon: Secondary | ICD-10-CM | POA: Diagnosis not present

## 2020-05-09 DIAGNOSIS — Z950 Presence of cardiac pacemaker: Secondary | ICD-10-CM | POA: Insufficient documentation

## 2020-05-09 DIAGNOSIS — Z79899 Other long term (current) drug therapy: Secondary | ICD-10-CM | POA: Insufficient documentation

## 2020-05-09 DIAGNOSIS — I110 Hypertensive heart disease with heart failure: Secondary | ICD-10-CM | POA: Insufficient documentation

## 2020-05-09 DIAGNOSIS — F419 Anxiety disorder, unspecified: Secondary | ICD-10-CM | POA: Diagnosis not present

## 2020-05-09 DIAGNOSIS — Z7951 Long term (current) use of inhaled steroids: Secondary | ICD-10-CM | POA: Insufficient documentation

## 2020-05-09 DIAGNOSIS — K5730 Diverticulosis of large intestine without perforation or abscess without bleeding: Secondary | ICD-10-CM | POA: Diagnosis not present

## 2020-05-09 DIAGNOSIS — Z6832 Body mass index (BMI) 32.0-32.9, adult: Secondary | ICD-10-CM | POA: Diagnosis not present

## 2020-05-09 DIAGNOSIS — F17210 Nicotine dependence, cigarettes, uncomplicated: Secondary | ICD-10-CM | POA: Insufficient documentation

## 2020-05-09 DIAGNOSIS — D122 Benign neoplasm of ascending colon: Secondary | ICD-10-CM | POA: Insufficient documentation

## 2020-05-09 DIAGNOSIS — E1142 Type 2 diabetes mellitus with diabetic polyneuropathy: Secondary | ICD-10-CM | POA: Diagnosis not present

## 2020-05-09 DIAGNOSIS — E785 Hyperlipidemia, unspecified: Secondary | ICD-10-CM | POA: Insufficient documentation

## 2020-05-09 DIAGNOSIS — I4891 Unspecified atrial fibrillation: Secondary | ICD-10-CM | POA: Insufficient documentation

## 2020-05-09 DIAGNOSIS — E669 Obesity, unspecified: Secondary | ICD-10-CM | POA: Diagnosis not present

## 2020-05-09 DIAGNOSIS — Z951 Presence of aortocoronary bypass graft: Secondary | ICD-10-CM | POA: Diagnosis not present

## 2020-05-09 DIAGNOSIS — I2510 Atherosclerotic heart disease of native coronary artery without angina pectoris: Secondary | ICD-10-CM | POA: Insufficient documentation

## 2020-05-09 DIAGNOSIS — Z8371 Family history of colonic polyps: Secondary | ICD-10-CM | POA: Diagnosis present

## 2020-05-09 HISTORY — PX: COLONOSCOPY WITH PROPOFOL: SHX5780

## 2020-05-09 HISTORY — PX: POLYPECTOMY: SHX5525

## 2020-05-09 LAB — GLUCOSE, CAPILLARY
Glucose-Capillary: 79 mg/dL (ref 70–99)
Glucose-Capillary: 87 mg/dL (ref 70–99)

## 2020-05-09 SURGERY — COLONOSCOPY WITH PROPOFOL
Anesthesia: Monitor Anesthesia Care

## 2020-05-09 MED ORDER — PROPOFOL 500 MG/50ML IV EMUL
INTRAVENOUS | Status: DC | PRN
  Administered 2020-05-09: 100 ug/kg/min via INTRAVENOUS

## 2020-05-09 MED ORDER — PROPOFOL 10 MG/ML IV BOLUS
INTRAVENOUS | Status: AC
  Filled 2020-05-09: qty 20

## 2020-05-09 MED ORDER — SODIUM CHLORIDE 0.9 % IV SOLN: INTRAVENOUS | Status: DC

## 2020-05-09 MED ORDER — LACTATED RINGERS IV SOLN: INTRAVENOUS | Status: DC

## 2020-05-09 MED ORDER — LIDOCAINE 2% (20 MG/ML) 5 ML SYRINGE
INTRAMUSCULAR | Status: DC | PRN
  Administered 2020-05-09: 60 mg via INTRAVENOUS

## 2020-05-09 MED ORDER — PROPOFOL 10 MG/ML IV BOLUS
INTRAVENOUS | Status: DC | PRN
  Administered 2020-05-09: 20 mg via INTRAVENOUS
  Administered 2020-05-09: 30 mg via INTRAVENOUS

## 2020-05-09 SURGICAL SUPPLY — 22 items

## 2020-05-09 NOTE — Transfer of Care (Signed)
Immediate Anesthesia Transfer of Care Note  Patient: Ross [name]  Procedure(s) Performed: COLONOSCOPY WITH PROPOFOL (N/A ) POLYPECTOMY  Patient Location: Endoscopy Unit  Anesthesia Type:MAC  Level of Consciousness: patient drowsy and cooperative  Airway & Oxygen Therapy: Patient Spontanous Breathing and Patient connected to face mask oxygen  Post-op Assessment: Report given to RN and Post -op Vital signs reviewed and stable  Post vital signs: Reviewed and stable  Last Vitals:  Vitals Value Taken Time  BP 146/74 05/09/20 1151  Temp    Pulse 60 05/09/20 1152  Resp 28 05/09/20 1152  SpO2 98 % 05/09/20 1152  Vitals shown include unvalidated device data.  Last Pain:  Vitals:   05/09/20 0959  TempSrc: Oral  PainSc: 4          Complications: No apparent anesthesia complications

## 2020-05-09 NOTE — Anesthesia Procedure Notes (Signed)
Procedure Name: MAC Date/Time: 05/09/2020 11:07 AM Performed by: West Pugh, CRNA Pre-anesthesia Checklist: Patient identified, Emergency Drugs available, Suction available, Patient being monitored and Timeout performed Patient Re-evaluated:Patient Re-evaluated prior to induction Oxygen Delivery Method: Simple face mask Preoxygenation: Pre-oxygenation with 100% oxygen Placement Confirmation: positive ETCO2 Dental Injury: Teeth and Oropharynx as per pre-operative assessment

## 2020-05-09 NOTE — Anesthesia Preprocedure Evaluation (Addendum)
Anesthesia Evaluation  Patient identified by MRN, date of birth, ID band Patient awake    Reviewed: Allergy & Precautions, NPO status , Patient's Chart, lab work & pertinent test results, reviewed documented beta blocker date and time   History of Anesthesia Complications Negative for: history of anesthetic complications  Airway Mallampati: II  TM Distance: >3 FB Neck ROM: Full    Dental  (+) Partial Upper, Missing, Dental Advisory Given   Pulmonary COPD,  COPD inhaler, Current Smoker and Patient abstained from smoking.,  05/06/2020 SARS coronavirus NEG   breath sounds clear to auscultation       Cardiovascular hypertension, Pt. on medications and Pt. on home beta blockers + angina + CAD and + CABG  + pacemaker (single chamber)  Rhythm:Regular Rate:Normal  '18 Cath following positive stress test:           Bypass graft occlusive disease with occlusion of the SVG to the distal RCA, and occlusion of the SVG to the circumflex.  Patent SVG to the diagonal.  Patent LIMA to the LAD.  Severe native vessel coronary disease with total occlusion of the ramus intermedius and obtuse marginal branches, total occlusion of the first diagonal, 99% stenosis in the mid LAD, and segmental 99% stenosis in the mid RCA followed by 90% distal stenosis.  Decreased left ventricular function with EF estimated to be 35-40%. Global hypokinesis.   Neuro/Psych negative neurological ROS     GI/Hepatic negative GI ROS, Neg liver ROS,   Endo/Other  diabetes (glu 79)  Renal/GU negative Renal ROS     Musculoskeletal  (+) Arthritis ,   Abdominal   Peds  Hematology eliquis   Anesthesia Other Findings   Reproductive/Obstetrics                            Anesthesia Physical Anesthesia Plan  ASA: III  Anesthesia Plan: MAC   Post-op Pain Management:    Induction:   PONV Risk Score and Plan: 0 and Treatment may vary  due to age or medical condition  Airway Management Planned: Natural Airway and Simple Face Mask  Additional Equipment: None  Intra-op Plan:   Post-operative Plan:   Informed Consent: I have reviewed the patients History and Physical, chart, labs and discussed the procedure including the risks, benefits and alternatives for the proposed anesthesia with the patient or authorized representative who has indicated his/her understanding and acceptance.     Dental advisory given  Plan Discussed with: CRNA and Surgeon  Anesthesia Plan Comments:        Anesthesia Quick Evaluation
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
## 2020-05-09 NOTE — Discharge Instructions (Signed)
Resume Eliquis from tomorrow.

## 2020-05-09 NOTE — Anesthesia Postprocedure Evaluation (Signed)
Anesthesia Post Note  Patient: Ross [name]  Procedure(s) Performed: COLONOSCOPY WITH PROPOFOL (N/A ) POLYPECTOMY     Patient location during evaluation: Endoscopy Anesthesia Type: MAC Level of consciousness: awake and alert, oriented and patient cooperative Pain management: pain level controlled Vital Signs Assessment: post-procedure vital signs reviewed and stable Respiratory status: spontaneous breathing, nonlabored ventilation and respiratory function stable Cardiovascular status: blood pressure returned to baseline and stable Postop Assessment: no apparent nausea or vomiting and able to ambulate Anesthetic complications: no    Last Vitals:  Vitals:   05/09/20 1210 05/09/20 1215  BP: (!) 152/88   Pulse: (!) 59 (!) 59  Resp: 15 18  Temp:    SpO2: 93% 94%    Last Pain:  Vitals:   05/09/20 1215  TempSrc:   PainSc: 0-No pain                 Kosisochukwu Goldberg,E. [place] [facility]

## 2020-05-09 NOTE — Op Note (Signed)
[facility] Patient Name: Ross [name] Procedure Date: 05/09/2020 MRN: [id number] Attending MD: Otis Brace , MD Date of Birth: Jun 30, 1956 CSN: [id number] Age: 64 Admit Type: Outpatient Procedure:                Colonoscopy Indications:              Screening for colorectal malignant neoplasm, This                            is the patient's first colonoscopy, Family history                            of colonic polyps in a first-degree relative Providers:                Otis Brace, MD, Benetta Spar RN, RN, Laverda Sorenson, Technician, Christell Faith, CRNA Referring MD:              Medicines:                Sedation Administered by an Anesthesia Professional Complications:            No immediate complications. Estimated Blood Loss:     Estimated blood loss was minimal. Procedure:                Pre-Anesthesia Assessment:                           - Prior to the procedure, a History and Physical                            was performed, and patient medications and                            allergies were reviewed. The patient's tolerance of                            previous anesthesia was also reviewed. The risks                            and benefits of the procedure and the sedation                            options and risks were discussed with the patient.                            All questions were answered, and informed consent                            was obtained. Prior Anticoagulants: The patient has                            taken Eliquis (apixaban), last dose was 1 day prior  to procedure. ASA Grade Assessment: III - A patient                            with severe systemic disease. After reviewing the                            risks and benefits, the patient was deemed in                            satisfactory condition to undergo the procedure.                           After  obtaining informed consent, the colonoscope                            was passed under direct vision. Throughout the                            procedure, the patient's blood pressure, pulse, and                            oxygen saturations were monitored continuously. The                            PCF-H190DL ([id number] Olympus pediatric colonscope                            was introduced through the anus and advanced to the                            the cecum, identified by appendiceal orifice and                            ileocecal valve. The colonoscopy was performed                            without difficulty. The patient tolerated the                            procedure well. The quality of the bowel                            preparation was adequate to identify polyps 6 mm                            and larger in size. Scope In: 11:13:10 AM Scope Out: 11:41:31 AM Scope Withdrawal Time: 0 hours 13 minutes 48 seconds  Total Procedure Duration: 0 hours 28 minutes 21 seconds  Findings:      The perianal and digital rectal examinations were normal.      Two sessile polyps were found in the ascending colon. The polyps were 5       to 7 mm in size. These polyps were removed with a cold snare. Resection       and  retrieval were complete.      A 7 mm polyp was found in the hepatic flexure. The polyp was sessile.       The polyp was removed with a cold snare. Resection and retrieval were       complete.      Five sessile polyps were found in the transverse colon. The polyps were       3 to 8 mm in size. These polyps were removed with a cold snare.       Resection and retrieval were complete.      Five sessile polyps were found in the sigmoid colon. The polyps were 4       to 7 mm in size. These polyps were removed with a cold snare. Resection       and retrieval were complete.      A few medium-mouthed diverticula were found in the sigmoid colon.      Internal hemorrhoids were  found during retroflexion. The hemorrhoids       were large. Impression:               - Two 5 to 7 mm polyps in the ascending colon,                            removed with a cold snare. Resected and retrieved.                           - One 7 mm polyp at the hepatic flexure, removed                            with a cold snare. Resected and retrieved.                           - Five 3 to 8 mm polyps in the transverse colon,                            removed with a cold snare. Resected and retrieved.                           - Five 4 to 7 mm polyps in the sigmoid colon,                            removed with a cold snare. Resected and retrieved.                           - Diverticulosis in the sigmoid colon.                           - Internal hemorrhoids. Moderate Sedation:      Moderate (conscious) sedation was personally administered by an       anesthesia professional. The following parameters were monitored: oxygen       saturation, heart rate, blood pressure, and response to care. Recommendation:           - Patient has a contact number available for  emergencies. The signs and symptoms of potential                            delayed complications were discussed with the                            patient. Return to normal activities tomorrow.                            Written discharge instructions were provided to the                            patient.                           - Resume previous diet.                           - Continue present medications.                           - Await pathology results.                           - Repeat colonoscopy date to be determined after                            pending pathology results are reviewed for                            surveillance of multiple polyps.                           - Resume Eliquis (apixaban) at prior dose tomorrow. Procedure Code(s):        --- Professional ---                            [phone], Colonoscopy, flexible; with removal of                            tumor(s), polyp(s), or other lesion(s) by snare                            technique Diagnosis Code(s):        --- Professional ---                           K63.5, Polyp of colon                           Z12.11, Encounter for screening for malignant                            neoplasm of colon                           K64.8, Other hemorrhoids  Z83.71, Family history of colonic polyps                           K57.30, Diverticulosis of large intestine without                            perforation or abscess without bleeding CPT copyright 2019 American [employer] Association. All rights reserved. The codes documented in this report are preliminary and upon coder review may  be revised to meet current compliance requirements. Otis Brace, MD Otis Brace, MD 05/09/2020 11:48:21 AM Number of Addenda: 0

## 2020-05-10 ENCOUNTER — Encounter: Payer: Self-pay | Admitting: *Deleted

## 2020-05-10 LAB — SURGICAL PATHOLOGY

## 2020-05-13 ENCOUNTER — Ambulatory Visit
Admission: RE | Admit: 2020-05-13 | Discharge: 2020-05-13 | Disposition: A | Discharge status: Home / Self Care | Payer: Medicare Other | Source: Ambulatory Visit | Attending: Family Medicine | Admitting: Family Medicine

## 2020-05-13 DIAGNOSIS — Z720 Tobacco use: Secondary | ICD-10-CM

## 2020-05-28 ENCOUNTER — Ambulatory Visit (INDEPENDENT_AMBULATORY_CARE_PROVIDER_SITE_OTHER): Payer: Medicare Other | Admitting: *Deleted

## 2020-05-28 DIAGNOSIS — I495 Sick sinus syndrome: Secondary | ICD-10-CM

## 2020-05-29 ENCOUNTER — Telehealth: Payer: Self-pay

## 2020-05-29 NOTE — Telephone Encounter (Signed)
Spoke with patient to remind of missed remote transmission 

## 2020-06-02 LAB — CUP PACEART REMOTE DEVICE CHECK
Battery Remaining Longevity: 80 mo
Battery Voltage: 3.01 V
Brady Statistic RV Percent Paced: 96.86 %
Date Time Interrogation Session: 20210611143741
Implantable Lead Implant Date: 20170126
Implantable Lead Location: 753860
Implantable Lead Model: 5076
Implantable Pulse Generator Implant Date: 20170126
Lead Channel Impedance Value: 532 Ohm
Lead Channel Impedance Value: 608 Ohm
Lead Channel Pacing Threshold Amplitude: 0.5 V
Lead Channel Pacing Threshold Pulse Width: 0.4 ms
Lead Channel Sensing Intrinsic Amplitude: 31.625 mV
Lead Channel Sensing Intrinsic Amplitude: 31.625 mV
Lead Channel Setting Pacing Amplitude: 2 V
Lead Channel Setting Pacing Pulse Width: 0.4 ms
Lead Channel Setting Sensing Sensitivity: 2 mV

## 2020-06-03 NOTE — Progress Notes (Signed)
Remote pacemaker transmission.   

## 2020-09-20 ENCOUNTER — Ambulatory Visit (INDEPENDENT_AMBULATORY_CARE_PROVIDER_SITE_OTHER): Payer: Medicare Other

## 2020-09-20 DIAGNOSIS — I495 Sick sinus syndrome: Secondary | ICD-10-CM

## 2020-09-22 LAB — CUP PACEART REMOTE DEVICE CHECK
Battery Remaining Longevity: 81 mo
Battery Voltage: 3.01 V
Brady Statistic RV Percent Paced: 96.36 %
Date Time Interrogation Session: 20211001163239
Implantable Lead Implant Date: 20170126
Implantable Lead Location: 753860
Implantable Lead Model: 5076
Implantable Pulse Generator Implant Date: 20170126
Lead Channel Impedance Value: 418 Ohm
Lead Channel Impedance Value: 513 Ohm
Lead Channel Pacing Threshold Amplitude: 0.875 V
Lead Channel Pacing Threshold Pulse Width: 0.4 ms
Lead Channel Sensing Intrinsic Amplitude: 29.75 mV
Lead Channel Sensing Intrinsic Amplitude: 29.75 mV
Lead Channel Setting Pacing Amplitude: 2 V
Lead Channel Setting Pacing Pulse Width: 0.4 ms
Lead Channel Setting Sensing Sensitivity: 2 mV

## 2020-09-23 NOTE — Progress Notes (Signed)
Remote pacemaker transmission.   

## 2020-10-05 ENCOUNTER — Ambulatory Visit: Payer: Medicare Other | Attending: Internal Medicine

## 2020-10-05 DIAGNOSIS — Z23 Encounter for immunization: Secondary | ICD-10-CM

## 2020-10-05 NOTE — Progress Notes (Signed)
   Covid-19 Vaccination Clinic  Name:  ANTONIS LOR    MRN: [id number] DOB: 11-May-1956  10/05/2020  Mr. Adames was observed post Covid-19 immunization for 15 minutes without incident. He was provided with Vaccine Information Sheet and instruction to access the V-Safe system.   Mr. Kann was instructed to call 911 with any severe reactions post vaccine: Marland Kitchen Difficulty breathing  . Swelling of face and throat  . A fast heartbeat  . A bad rash all over body  . Dizziness and weakness

## 2020-10-08 ENCOUNTER — Ambulatory Visit: Payer: Medicare Other | Admitting: Cardiology

## 2020-10-08 ENCOUNTER — Other Ambulatory Visit: Payer: Self-pay

## 2020-10-08 ENCOUNTER — Encounter: Payer: Self-pay | Admitting: Cardiology

## 2020-10-08 VITALS — BP 142/78 | HR 72 | Ht 74.0 in | Wt 247.0 lb

## 2020-10-08 DIAGNOSIS — I4821 Permanent atrial fibrillation: Secondary | ICD-10-CM | POA: Diagnosis not present
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
## 2020-12-25 DIAGNOSIS — R059 Cough, unspecified: Secondary | ICD-10-CM | POA: Diagnosis not present

## 2020-12-27 ENCOUNTER — Other Ambulatory Visit: Payer: Self-pay

## 2020-12-27 ENCOUNTER — Other Ambulatory Visit: Payer: Medicare Other

## 2020-12-27 DIAGNOSIS — Z20822 Contact with and (suspected) exposure to covid-19: Secondary | ICD-10-CM | POA: Diagnosis not present

## 2020-12-28 ENCOUNTER — Other Ambulatory Visit: Payer: Medicare Other

## 2020-12-31 DIAGNOSIS — I2510 Atherosclerotic heart disease of native coronary artery without angina pectoris: Secondary | ICD-10-CM | POA: Diagnosis not present

## 2020-12-31 DIAGNOSIS — I119 Hypertensive heart disease without heart failure: Secondary | ICD-10-CM | POA: Diagnosis not present

## 2020-12-31 DIAGNOSIS — E7800 Pure hypercholesterolemia, unspecified: Secondary | ICD-10-CM | POA: Diagnosis not present

## 2020-12-31 DIAGNOSIS — E1140 Type 2 diabetes mellitus with diabetic neuropathy, unspecified: Secondary | ICD-10-CM | POA: Diagnosis not present

## 2020-12-31 DIAGNOSIS — M170 Bilateral primary osteoarthritis of knee: Secondary | ICD-10-CM | POA: Diagnosis not present

## 2020-12-31 DIAGNOSIS — I10 Essential (primary) hypertension: Secondary | ICD-10-CM | POA: Diagnosis not present

## 2020-12-31 DIAGNOSIS — E1159 Type 2 diabetes mellitus with other circulatory complications: Secondary | ICD-10-CM | POA: Diagnosis not present

## 2020-12-31 DIAGNOSIS — I5042 Chronic combined systolic (congestive) and diastolic (congestive) heart failure: Secondary | ICD-10-CM | POA: Diagnosis not present

## 2020-12-31 DIAGNOSIS — J449 Chronic obstructive pulmonary disease, unspecified: Secondary | ICD-10-CM | POA: Diagnosis not present

## 2020-12-31 DIAGNOSIS — I4891 Unspecified atrial fibrillation: Secondary | ICD-10-CM | POA: Diagnosis not present

## 2020-12-31 DIAGNOSIS — I25709 Atherosclerosis of coronary artery bypass graft(s), unspecified, with unspecified angina pectoris: Secondary | ICD-10-CM | POA: Diagnosis not present

## 2020-12-31 LAB — NOVEL CORONAVIRUS, NAA: SARS-CoV-2, NAA: NOT DETECTED
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
## 2021-01-15 ENCOUNTER — Other Ambulatory Visit: Payer: Self-pay

## 2021-01-15 ENCOUNTER — Ambulatory Visit: Payer: Medicare Other | Admitting: Interventional Cardiology

## 2021-01-15 ENCOUNTER — Encounter: Payer: Self-pay | Admitting: Interventional Cardiology

## 2021-01-15 VITALS — BP 90/60 | HR 82 | Ht 74.0 in | Wt 233.0 lb

## 2021-01-15 DIAGNOSIS — E785 Hyperlipidemia, unspecified: Secondary | ICD-10-CM

## 2021-01-15 DIAGNOSIS — Z7189 Other specified counseling: Secondary | ICD-10-CM | POA: Diagnosis not present

## 2021-01-15 DIAGNOSIS — I25709 Atherosclerosis of coronary artery bypass graft(s), unspecified, with unspecified angina pectoris: Secondary | ICD-10-CM

## 2021-01-15 DIAGNOSIS — I4821 Permanent atrial fibrillation: Secondary | ICD-10-CM | POA: Diagnosis not present

## 2021-01-15 DIAGNOSIS — I10 Essential (primary) hypertension: Secondary | ICD-10-CM | POA: Diagnosis not present

## 2021-01-15 DIAGNOSIS — Z950 Presence of cardiac pacemaker: Secondary | ICD-10-CM | POA: Diagnosis not present

## 2021-01-15 DIAGNOSIS — I5042 Chronic combined systolic (congestive) and diastolic (congestive) heart failure: Secondary | ICD-10-CM

## 2021-01-15 NOTE — Addendum Note (Signed)
Addended by: Loren Racer on: 01/15/2021 04:36 PM   Modules accepted: Orders

## 2021-01-15 NOTE — Patient Instructions (Signed)

## 2021-01-31 DIAGNOSIS — G8929 Other chronic pain: Secondary | ICD-10-CM | POA: Diagnosis not present

## 2021-01-31 DIAGNOSIS — Z79899 Other long term (current) drug therapy: Secondary | ICD-10-CM | POA: Diagnosis not present

## 2021-01-31 DIAGNOSIS — M25561 Pain in right knee: Secondary | ICD-10-CM | POA: Diagnosis not present

## 2021-01-31 DIAGNOSIS — M25562 Pain in left knee: Secondary | ICD-10-CM | POA: Diagnosis not present

## 2021-01-31 DIAGNOSIS — M5416 Radiculopathy, lumbar region: Secondary | ICD-10-CM | POA: Diagnosis not present

## 2021-02-04 ENCOUNTER — Ambulatory Visit (HOSPITAL_COMMUNITY): Payer: Medicare Other

## 2021-02-17 DIAGNOSIS — I10 Essential (primary) hypertension: Secondary | ICD-10-CM | POA: Diagnosis not present

## 2021-02-17 DIAGNOSIS — I5042 Chronic combined systolic (congestive) and diastolic (congestive) heart failure: Secondary | ICD-10-CM | POA: Diagnosis not present

## 2021-02-17 DIAGNOSIS — E7800 Pure hypercholesterolemia, unspecified: Secondary | ICD-10-CM | POA: Diagnosis not present

## 2021-02-17 DIAGNOSIS — I25709 Atherosclerosis of coronary artery bypass graft(s), unspecified, with unspecified angina pectoris: Secondary | ICD-10-CM | POA: Diagnosis not present

## 2021-02-17 DIAGNOSIS — I119 Hypertensive heart disease without heart failure: Secondary | ICD-10-CM | POA: Diagnosis not present

## 2021-02-17 DIAGNOSIS — E1140 Type 2 diabetes mellitus with diabetic neuropathy, unspecified: Secondary | ICD-10-CM | POA: Diagnosis not present

## 2021-02-17 DIAGNOSIS — I4891 Unspecified atrial fibrillation: Secondary | ICD-10-CM | POA: Diagnosis not present

## 2021-02-17 DIAGNOSIS — I2510 Atherosclerotic heart disease of native coronary artery without angina pectoris: Secondary | ICD-10-CM | POA: Diagnosis not present

## 2021-02-17 DIAGNOSIS — E1159 Type 2 diabetes mellitus with other circulatory complications: Secondary | ICD-10-CM | POA: Diagnosis not present

## 2021-02-17 DIAGNOSIS — J449 Chronic obstructive pulmonary disease, unspecified: Secondary | ICD-10-CM | POA: Diagnosis not present

## 2021-02-17 DIAGNOSIS — M170 Bilateral primary osteoarthritis of knee: Secondary | ICD-10-CM | POA: Diagnosis not present

## 2021-02-27 ENCOUNTER — Ambulatory Visit (HOSPITAL_COMMUNITY): Payer: Medicare Other | Attending: Cardiology

## 2021-02-27 ENCOUNTER — Other Ambulatory Visit: Payer: Self-pay

## 2021-02-27 DIAGNOSIS — I5042 Chronic combined systolic (congestive) and diastolic (congestive) heart failure: Secondary | ICD-10-CM

## 2021-02-28 DIAGNOSIS — Z79899 Other long term (current) drug therapy: Secondary | ICD-10-CM | POA: Diagnosis not present

## 2021-02-28 DIAGNOSIS — M25562 Pain in left knee: Secondary | ICD-10-CM | POA: Diagnosis not present

## 2021-02-28 DIAGNOSIS — G8929 Other chronic pain: Secondary | ICD-10-CM | POA: Diagnosis not present

## 2021-02-28 DIAGNOSIS — M25561 Pain in right knee: Secondary | ICD-10-CM | POA: Diagnosis not present

## 2021-02-28 DIAGNOSIS — M5416 Radiculopathy, lumbar region: Secondary | ICD-10-CM | POA: Diagnosis not present

## 2021-02-28 LAB — ECHOCARDIOGRAM COMPLETE
Area-P 1/2: 4.09 cm2
S' Lateral: 3.6 cm

## 2021-03-04 ENCOUNTER — Telehealth: Payer: Self-pay | Admitting: *Deleted

## 2021-03-04 DIAGNOSIS — I5042 Chronic combined systolic (congestive) and diastolic (congestive) heart failure: Secondary | ICD-10-CM

## 2021-03-04 MED ORDER — ENTRESTO 24-26 MG PO TABS: 1.0000 | ORAL_TABLET | Freq: Two times a day (BID) | ORAL | 11 refills | Status: DC

## 2021-03-04 NOTE — Telephone Encounter (Signed)
-----   Message from Belva Crome, MD sent at 03/01/2021  7:26 PM EST ----- Let the patient know the heart has gotten weaker than in 2018. Stop lisinopril x 36 hours then start Entresto 24/26 mg BID. BMET 7 days later.Needs 2-3 week f/u for medication readjustment to improve heart. A copy will be sent to Orpah Melter, MD

## 2021-03-04 NOTE — Telephone Encounter (Signed)
Spoke with Ross [name] and made him aware of results and recommendations.  Ross [name] agreeable to come in for labs on 3/25 and to see Dr. Tamala Julian on 4/7.  Advised to let us know if any issues with the medication or getting it picked up.  Advised Ross [name] to find out what his cost was going to be and let us know if it was going to be too expensive and we can look at a PA or Ross [name] assistance.  Ross [name] agreeable to plan.

## 2021-03-14 ENCOUNTER — Other Ambulatory Visit: Payer: Medicare Other | Admitting: *Deleted

## 2021-03-14 ENCOUNTER — Other Ambulatory Visit: Payer: Self-pay

## 2021-03-14 DIAGNOSIS — I5042 Chronic combined systolic (congestive) and diastolic (congestive) heart failure: Secondary | ICD-10-CM | POA: Diagnosis not present

## 2021-03-14 LAB — BASIC METABOLIC PANEL
BUN/Creatinine Ratio: 18 (ref 10–24)
BUN: 22 mg/dL (ref 8–27)
CO2: 21 mmol/L (ref 20–29)
Calcium: 11.5 mg/dL — ABNORMAL HIGH (ref 8.6–10.2)
Chloride: 107 mmol/L — ABNORMAL HIGH (ref 96–106)
Creatinine, Ser: 1.23 mg/dL (ref 0.76–1.27)
Glucose: 108 mg/dL — ABNORMAL HIGH (ref 65–99)
Potassium: 4.4 mmol/L (ref 3.5–5.2)
Sodium: 142 mmol/L (ref 134–144)
eGFR: 65 mL/min/{1.73_m2} (ref >59)

## 2021-03-16 DIAGNOSIS — J449 Chronic obstructive pulmonary disease, unspecified: Secondary | ICD-10-CM | POA: Diagnosis not present

## 2021-03-16 DIAGNOSIS — E7800 Pure hypercholesterolemia, unspecified: Secondary | ICD-10-CM | POA: Diagnosis not present

## 2021-03-16 DIAGNOSIS — E1140 Type 2 diabetes mellitus with diabetic neuropathy, unspecified: Secondary | ICD-10-CM | POA: Diagnosis not present

## 2021-03-16 DIAGNOSIS — E1159 Type 2 diabetes mellitus with other circulatory complications: Secondary | ICD-10-CM | POA: Diagnosis not present

## 2021-03-16 DIAGNOSIS — I5042 Chronic combined systolic (congestive) and diastolic (congestive) heart failure: Secondary | ICD-10-CM | POA: Diagnosis not present

## 2021-03-16 DIAGNOSIS — I25709 Atherosclerosis of coronary artery bypass graft(s), unspecified, with unspecified angina pectoris: Secondary | ICD-10-CM | POA: Diagnosis not present

## 2021-03-16 DIAGNOSIS — I10 Essential (primary) hypertension: Secondary | ICD-10-CM | POA: Diagnosis not present

## 2021-03-16 DIAGNOSIS — I4891 Unspecified atrial fibrillation: Secondary | ICD-10-CM | POA: Diagnosis not present

## 2021-03-16 DIAGNOSIS — M170 Bilateral primary osteoarthritis of knee: Secondary | ICD-10-CM | POA: Diagnosis not present

## 2021-03-16 DIAGNOSIS — I119 Hypertensive heart disease without heart failure: Secondary | ICD-10-CM | POA: Diagnosis not present

## 2021-03-17 NOTE — Progress Notes (Signed)
Pt has been made aware of normal result and verbalized understanding.  jw

## 2021-03-18 ENCOUNTER — Ambulatory Visit (INDEPENDENT_AMBULATORY_CARE_PROVIDER_SITE_OTHER): Payer: Medicare Other

## 2021-03-18 DIAGNOSIS — I5042 Chronic combined systolic (congestive) and diastolic (congestive) heart failure: Secondary | ICD-10-CM | POA: Diagnosis not present

## 2021-03-18 LAB — CUP PACEART REMOTE DEVICE CHECK
Battery Remaining Longevity: 71 mo
Battery Voltage: 3 V
Brady Statistic RV Percent Paced: 95.73 %
Date Time Interrogation Session: 20220328193545
Implantable Lead Implant Date: 20170126
Implantable Lead Location: 753860
Implantable Lead Model: 5076
Implantable Pulse Generator Implant Date: 20170126
Lead Channel Impedance Value: 399 Ohm
Lead Channel Impedance Value: 494 Ohm
Lead Channel Pacing Threshold Amplitude: 0.625 V
Lead Channel Pacing Threshold Pulse Width: 0.4 ms
Lead Channel Sensing Intrinsic Amplitude: 4.875 mV
Lead Channel Sensing Intrinsic Amplitude: 4.875 mV
Lead Channel Setting Pacing Amplitude: 2 V
Lead Channel Setting Pacing Pulse Width: 0.4 ms
Lead Channel Setting Sensing Sensitivity: 2 mV

## 2021-03-21 ENCOUNTER — Telehealth: Payer: Self-pay | Admitting: Interventional Cardiology

## 2021-03-21 NOTE — Telephone Encounter (Signed)
Pt c/o medication issue:  1. Name of Medication:  Entresto 24/26 mg BID  2. How are you currently taking this medication (dosage and times per day)?  Patient was taking 2 pills daily, but he states he has not taken it since Thursday morning because of the symptoms it's causing  3. Are you having a reaction (difficulty breathing--STAT)?  Yes  4. What is your medication issue?  Patient states the medication is causing dizziness, nausea, and low BP  STAT if patient feels like he/she is going to faint   1) Are you dizzy now?  No  2) Do you feel faint or have you passed out?  No   3) Do you have any other symptoms?  Nausea, low BP  4) Have you checked your HR and BP (record if available)?  BP- 110/70 BP- 90/60 Patient does not have HR readings, but he states his HR has been elevated as well.  Pt c/o BP issue: STAT if pt c/o blurred vision, one-sided weakness or slurred speech  1. What are your last 5 BP readings?  110/70 90/60   2. Are you having any other symptoms (ex. Dizziness, headache, blurred vision, passed out)?  Dizziness, nausea  3. What is your BP issue?  Patient states this medication is causing low BP, dizziness and nausea

## 2021-03-21 NOTE — Telephone Encounter (Signed)
Pt states since starting Entresto his BP has been quite low for him.  Averaging 90-105/60-75.  Gets dizzy and nauseous with hypotension.  Hasn't taken an Entresto pill since Wednesday at 3am.  BP still averaging 115-120/75-80.  Feels fatigued since BP has been low.  Currently takes Coreg 25mg  BID and Imdur 30mg .  HR has been running 95-115 when his BP gets really low.  Advised I will send to Dr. Tamala Julian for review and advisement.

## 2021-03-22 NOTE — Telephone Encounter (Signed)
Stop Imdur. Let us know if increased CP. Decrease Entresto to Q HS. Measure BP daily. When consistently > 100 mmHg, increase entresto back to TWICE daily. Decrease the Carvedilol to 12.5 mg BID Start digoxin .125 mg daily. Dig level at [place] soon.

## 2021-03-24 MED ORDER — DIGOXIN 125 MCG PO TABS
0.1250 mg | ORAL_TABLET | Freq: Every day | ORAL | 3 refills | Status: DC
Start: 2021-03-24 — End: 2021-03-27

## 2021-03-24 MED ORDER — CARVEDILOL 12.5 MG PO TABS
12.5000 mg | ORAL_TABLET | Freq: Two times a day (BID) | ORAL | 3 refills | Status: DC
Start: 2021-03-24 — End: 2021-04-14

## 2021-03-24 NOTE — Telephone Encounter (Signed)
Spoke with pt and made him aware of medication changes.  Pt agreeable to plan.  Advised him to monitor BP in the mornings when he gets up so we can see what his BP is like on the [place] since we are restarting it.  Advised to bring those readings to appt on Thursday.
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
## 2021-03-27 ENCOUNTER — Encounter: Payer: Self-pay | Admitting: Interventional Cardiology

## 2021-03-27 ENCOUNTER — Other Ambulatory Visit: Payer: Self-pay

## 2021-03-27 ENCOUNTER — Ambulatory Visit: Payer: Medicare Other | Admitting: Interventional Cardiology

## 2021-03-27 VITALS — BP 128/60 | HR 67 | Ht 74.0 in | Wt 238.0 lb

## 2021-03-27 DIAGNOSIS — Z950 Presence of cardiac pacemaker: Secondary | ICD-10-CM | POA: Diagnosis not present

## 2021-03-27 DIAGNOSIS — I4821 Permanent atrial fibrillation: Secondary | ICD-10-CM | POA: Diagnosis not present

## 2021-03-27 DIAGNOSIS — I5042 Chronic combined systolic (congestive) and diastolic (congestive) heart failure: Secondary | ICD-10-CM | POA: Diagnosis not present

## 2021-03-27 DIAGNOSIS — I10 Essential (primary) hypertension: Secondary | ICD-10-CM

## 2021-03-27 DIAGNOSIS — E785 Hyperlipidemia, unspecified: Secondary | ICD-10-CM

## 2021-03-27 DIAGNOSIS — I25709 Atherosclerosis of coronary artery bypass graft(s), unspecified, with unspecified angina pectoris: Secondary | ICD-10-CM | POA: Diagnosis not present

## 2021-03-27 MED ORDER — ENTRESTO 24-26 MG PO TABS: ORAL_TABLET | ORAL | 11 refills | Status: DC

## 2021-03-27 NOTE — Telephone Encounter (Signed)
Discussed with patient today.

## 2021-03-27 NOTE — Patient Instructions (Signed)
Medication Instructions:  1) RESTART Entresto 24/26mg .  Take a half tablet twice daily.  Increase to a full tablet twice daily if blood pressure allows.   2) DISCONTINUE Digoxin  *If you need a refill on your cardiac medications before your next appointment, please call your pharmacy*   Lab Work: None If you have labs (blood work) drawn today and your tests are completely normal, you will receive your results only by: Marland Kitchen MyChart Message (if you have MyChart) OR . A paper copy in the mail If you have any lab test that is abnormal or we need to change your treatment, we will call you to review the results.   Testing/Procedures: None   Follow-Up: At [facility], you and your health needs are our priority.  As part of our continuing mission to provide you with exceptional heart care, we have created designated Provider Care Teams.  These Care Teams include your primary Cardiologist (physician) and Advanced Practice Providers (APPs -  Physician Assistants and Nurse Practitioners) who all work together to provide you with the care you need, when you need it.  We recommend signing up for the patient portal called "MyChart".  Sign up information is provided on this After Visit Summary.  MyChart is used to connect with patients for Virtual Visits (Telemedicine).  Patients are able to view lab/test results, encounter notes, upcoming appointments, etc.  Non-urgent messages can be sent to your provider as well.   To learn more about what you can do with MyChart, go to [email or web address].    Your next appointment:   2 week(s)  The format for your next appointment:   In Person  Provider:   You may see Sinclair Grooms, MD or one of the following Advanced Practice Providers on your designated Care Team:    Kathyrn Drown, NP    Other Instructions

## 2021-04-01 NOTE — Progress Notes (Signed)
Remote pacemaker transmission.   

## 2021-04-03 ENCOUNTER — Telehealth: Payer: Self-pay

## 2021-04-03 NOTE — Telephone Encounter (Signed)
Paperwork signed and faxed.

## 2021-04-03 NOTE — Telephone Encounter (Signed)
**Note De-Identified  Obfuscation** MD page of a [employer] pt asst application was faxed to the office from Astrid Drafts, [facility] with [facility] with a request for [place] to complete the provider page, have Dr Tamala Julian sign, date it, and to fax it to [employer] and that the pts part has already been sent to [employer] pt [employer]st [employer] already. I completed the MD page and emailed it to Dr Darliss Ridgel nurse so she can obtain his signature, date it and to fax to [employer] at number written on cover letter included or to place in nurses box in Medical Records to be faxed.

## 2021-04-10 ENCOUNTER — Telehealth: Payer: Self-pay

## 2021-04-10 NOTE — Telephone Encounter (Signed)
Close encounter 
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
## 2021-04-14 ENCOUNTER — Ambulatory Visit: Payer: Medicare Other | Admitting: Interventional Cardiology

## 2021-04-14 ENCOUNTER — Encounter: Payer: Self-pay | Admitting: Interventional Cardiology

## 2021-04-14 ENCOUNTER — Other Ambulatory Visit: Payer: Self-pay

## 2021-04-14 VITALS — BP 108/60 | HR 60 | Ht 74.0 in | Wt 240.2 lb

## 2021-04-14 DIAGNOSIS — I25709 Atherosclerosis of coronary artery bypass graft(s), unspecified, with unspecified angina pectoris: Secondary | ICD-10-CM

## 2021-04-14 DIAGNOSIS — I10 Essential (primary) hypertension: Secondary | ICD-10-CM

## 2021-04-14 DIAGNOSIS — E785 Hyperlipidemia, unspecified: Secondary | ICD-10-CM | POA: Diagnosis not present

## 2021-04-14 DIAGNOSIS — I5042 Chronic combined systolic (congestive) and diastolic (congestive) heart failure: Secondary | ICD-10-CM

## 2021-04-14 DIAGNOSIS — I4821 Permanent atrial fibrillation: Secondary | ICD-10-CM | POA: Diagnosis not present

## 2021-04-14 DIAGNOSIS — Z950 Presence of cardiac pacemaker: Secondary | ICD-10-CM | POA: Diagnosis not present

## 2021-04-14 MED ORDER — DAPAGLIFLOZIN PROPANEDIOL 5 MG PO TABS
5.0000 mg | ORAL_TABLET | Freq: Every day | ORAL | 11 refills | Status: DC
Start: 2021-04-14 — End: 2021-06-12

## 2021-04-14 NOTE — Patient Instructions (Signed)
Medication Instructions:  1) START Farxiga 5mg  once daily  *If you need a refill on your cardiac medications before your next appointment, please call your pharmacy*   Lab Work: None If you have labs (blood work) drawn today and your tests are completely normal, you will receive your results only by: Marland Kitchen MyChart Message (if you have MyChart) OR . A paper copy in the mail If you have any lab test that is abnormal or we need to change your treatment, we will call you to review the results.   Testing/Procedures: None   Follow-Up: At [facility], you and your health needs are our priority.  As part of our continuing mission to provide you with exceptional heart care, we have created designated Provider Care Teams.  These Care Teams include your primary Cardiologist (physician) and Advanced Practice Providers (APPs -  Physician Assistants and Nurse Practitioners) who all work together to provide you with the care you need, when you need it.  We recommend signing up for the patient portal called "MyChart".  Sign up information is provided on this After Visit Summary.  MyChart is used to connect with patients for Virtual Visits (Telemedicine).  Patients are able to view lab/test results, encounter notes, upcoming appointments, etc.  Non-urgent messages can be sent to your provider as well.   To learn more about what you can do with MyChart, go to [email or web address].    Your next appointment:   4-6 week(s)  The format for your next appointment:   In Person  Provider:   You may see Sinclair Grooms, MD or one of the following Advanced Practice Providers on your designated Care Team:    Kathyrn Drown, NP    Other Instructions

## 2021-04-19 DIAGNOSIS — E1159 Type 2 diabetes mellitus with other circulatory complications: Secondary | ICD-10-CM | POA: Diagnosis not present

## 2021-04-19 DIAGNOSIS — I2510 Atherosclerotic heart disease of native coronary artery without angina pectoris: Secondary | ICD-10-CM | POA: Diagnosis not present

## 2021-04-19 DIAGNOSIS — E7800 Pure hypercholesterolemia, unspecified: Secondary | ICD-10-CM | POA: Diagnosis not present

## 2021-04-19 DIAGNOSIS — I119 Hypertensive heart disease without heart failure: Secondary | ICD-10-CM | POA: Diagnosis not present

## 2021-04-19 DIAGNOSIS — M170 Bilateral primary osteoarthritis of knee: Secondary | ICD-10-CM | POA: Diagnosis not present

## 2021-04-19 DIAGNOSIS — I4891 Unspecified atrial fibrillation: Secondary | ICD-10-CM | POA: Diagnosis not present

## 2021-04-19 DIAGNOSIS — I25709 Atherosclerosis of coronary artery bypass graft(s), unspecified, with unspecified angina pectoris: Secondary | ICD-10-CM | POA: Diagnosis not present

## 2021-04-19 DIAGNOSIS — I10 Essential (primary) hypertension: Secondary | ICD-10-CM | POA: Diagnosis not present

## 2021-04-19 DIAGNOSIS — I5042 Chronic combined systolic (congestive) and diastolic (congestive) heart failure: Secondary | ICD-10-CM | POA: Diagnosis not present

## 2021-04-19 DIAGNOSIS — E1140 Type 2 diabetes mellitus with diabetic neuropathy, unspecified: Secondary | ICD-10-CM | POA: Diagnosis not present

## 2021-04-19 DIAGNOSIS — J449 Chronic obstructive pulmonary disease, unspecified: Secondary | ICD-10-CM | POA: Diagnosis not present

## 2021-05-04 DIAGNOSIS — Z20822 Contact with and (suspected) exposure to covid-19: Secondary | ICD-10-CM | POA: Diagnosis not present

## 2021-05-13 DIAGNOSIS — I5042 Chronic combined systolic (congestive) and diastolic (congestive) heart failure: Secondary | ICD-10-CM | POA: Diagnosis not present

## 2021-05-13 DIAGNOSIS — I119 Hypertensive heart disease without heart failure: Secondary | ICD-10-CM | POA: Diagnosis not present

## 2021-05-13 DIAGNOSIS — E7800 Pure hypercholesterolemia, unspecified: Secondary | ICD-10-CM | POA: Diagnosis not present

## 2021-05-13 DIAGNOSIS — I25709 Atherosclerosis of coronary artery bypass graft(s), unspecified, with unspecified angina pectoris: Secondary | ICD-10-CM | POA: Diagnosis not present

## 2021-05-13 DIAGNOSIS — I4891 Unspecified atrial fibrillation: Secondary | ICD-10-CM | POA: Diagnosis not present

## 2021-05-13 DIAGNOSIS — E1159 Type 2 diabetes mellitus with other circulatory complications: Secondary | ICD-10-CM | POA: Diagnosis not present

## 2021-05-13 DIAGNOSIS — I10 Essential (primary) hypertension: Secondary | ICD-10-CM | POA: Diagnosis not present

## 2021-05-13 DIAGNOSIS — E1140 Type 2 diabetes mellitus with diabetic neuropathy, unspecified: Secondary | ICD-10-CM | POA: Diagnosis not present

## 2021-05-13 DIAGNOSIS — J449 Chronic obstructive pulmonary disease, unspecified: Secondary | ICD-10-CM | POA: Diagnosis not present

## 2021-05-20 ENCOUNTER — Telehealth: Payer: Self-pay | Admitting: Interventional Cardiology

## 2021-05-20 NOTE — Telephone Encounter (Signed)
They sent over cost asistance application for the patient for dapagliflozin propanediol (FARXIGA) 5 MG TABS tablet. They are calling to see if we got that information through fax stated they faxed on May 26. Please advise.

## 2021-05-20 NOTE — Telephone Encounter (Signed)
**Note De-Identified  Obfuscation** We have not received the pts [place] pt asst application for [place].   I called [name] with [place] at [facility] back but [phone]) and was advised that she is on another call. They did offer to have her call me back but because Im clocking out soon I advised them that I will call her back in the morning.

## 2021-05-21 NOTE — Telephone Encounter (Signed)
**Note De-Identified  Obfuscation** Per [name] shefaxed the provider page to [phone] on 5/26. I have advised her that I did not receive it. I requested that she re-fax to [phone] with ATTN: [name] written on the cover letter.  She states that she will fax it again today and is requesting that we complete the page, have Dr Tamala Julian sign, date it, and to fax to [facility] and [place] and to also fax her a copy as well so she can add to the pts part of the application incase it is needed later.

## 2021-05-22 NOTE — Telephone Encounter (Signed)
**Note De-Identified  Obfuscation** We did receive the provider page of a [place] and [place] Pt Asst application for Farxiga from Astrid Drafts, PharmD with [employer]. I have completed the page and emailed it to Dr Darliss Ridgel nurse so she can obtain his signature, date it and to fax to [place] at the fax number written on cover letter included or to place in the "to be faxed" box in Medical Records to be faxed.

## 2021-05-23 ENCOUNTER — Telehealth: Payer: Self-pay | Admitting: Interventional Cardiology

## 2021-05-23 NOTE — Telephone Encounter (Signed)
Ross [name] from [facility] in [place] would like for Dr. Tamala Julian or nurse to give her a call in regards to Pt Asst application for Farxiga. Please call [phone] Ext. [phone]

## 2021-05-23 NOTE — Telephone Encounter (Signed)
Attempted to call [name] back and it would not give me the option to put in extension.  Kept receiving message that they would hold my place in line and call back and then call would disconnect.  Called [facility] in [place] and they are sending a message to [name] to have her call me back.

## 2021-05-25 DIAGNOSIS — L821 Other seborrheic keratosis: Secondary | ICD-10-CM | POA: Diagnosis not present

## 2021-05-25 DIAGNOSIS — S91201A Unspecified open wound of right great toe with damage to nail, initial encounter: Secondary | ICD-10-CM | POA: Diagnosis not present

## 2021-05-25 DIAGNOSIS — L97521 Non-pressure chronic ulcer of other part of left foot limited to breakdown of skin: Secondary | ICD-10-CM | POA: Diagnosis not present

## 2021-05-26 NOTE — Telephone Encounter (Signed)
Farxiga paperwork completed this morning and faxed

## 2021-05-26 NOTE — Telephone Encounter (Signed)
Paperwork completed and faxed.  °

## 2021-05-28 ENCOUNTER — Ambulatory Visit (INDEPENDENT_AMBULATORY_CARE_PROVIDER_SITE_OTHER): Payer: Medicare Other | Admitting: Podiatry

## 2021-05-28 ENCOUNTER — Other Ambulatory Visit: Payer: Self-pay

## 2021-05-28 DIAGNOSIS — L97321 Non-pressure chronic ulcer of left ankle limited to breakdown of skin: Secondary | ICD-10-CM | POA: Diagnosis not present
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
## 2021-05-29 ENCOUNTER — Ambulatory Visit: Payer: Medicare Other | Admitting: Interventional Cardiology

## 2021-05-29 ENCOUNTER — Encounter: Payer: Self-pay | Admitting: Interventional Cardiology

## 2021-05-29 VITALS — BP 122/60 | HR 76 | Ht 73.5 in | Wt 238.0 lb

## 2021-05-29 DIAGNOSIS — G4719 Other hypersomnia: Secondary | ICD-10-CM

## 2021-05-29 DIAGNOSIS — I25709 Atherosclerosis of coronary artery bypass graft(s), unspecified, with unspecified angina pectoris: Secondary | ICD-10-CM | POA: Diagnosis not present

## 2021-05-29 DIAGNOSIS — I10 Essential (primary) hypertension: Secondary | ICD-10-CM

## 2021-05-29 DIAGNOSIS — I5042 Chronic combined systolic (congestive) and diastolic (congestive) heart failure: Secondary | ICD-10-CM

## 2021-05-29 DIAGNOSIS — E785 Hyperlipidemia, unspecified: Secondary | ICD-10-CM

## 2021-05-29 DIAGNOSIS — L97511 Non-pressure chronic ulcer of other part of right foot limited to breakdown of skin: Secondary | ICD-10-CM

## 2021-05-29 DIAGNOSIS — R0683 Snoring: Secondary | ICD-10-CM | POA: Diagnosis not present

## 2021-05-29 DIAGNOSIS — I4821 Permanent atrial fibrillation: Secondary | ICD-10-CM | POA: Diagnosis not present

## 2021-05-29 NOTE — Patient Instructions (Signed)
Medication Instructions:  Your physician recommends that you continue on your current medications as directed. Please refer to the Current Medication list given to you today.  *If you need a refill on your cardiac medications before your next appointment, please call your pharmacy*   Lab Work: None If you have labs (blood work) drawn today and your tests are completely normal, you will receive your results only by: [place] (if you have MyChart) OR A paper copy in the mail If you have any lab test that is abnormal or we need to change your treatment, we will call you to review the results.   Testing/Procedures: Your physician has recommended that you have a sleep study. This test records several body functions during sleep, including: brain activity, eye movement, oxygen and carbon dioxide blood levels, heart rate and rhythm, breathing rate and rhythm, the flow of air through your mouth and nose, snoring, body muscle movements, and chest and belly movement.   Follow-Up: At [facility], you and your health needs are our priority.  As part of our continuing mission to provide you with exceptional heart care, we have created designated Provider Care Teams.  These Care Teams include your primary Cardiologist (physician) and Advanced Practice Providers (APPs -  Physician Assistants and Nurse Practitioners) who all work together to provide you with the care you need, when you need it.  We recommend signing up for the patient portal called "MyChart".  Sign up information is provided on this After Visit Summary.  MyChart is used to connect with patients for Virtual Visits (Telemedicine).  Patients are able to view lab/test results, encounter notes, upcoming appointments, etc.  Non-urgent messages can be sent to your provider as well.   To learn more about what you can do with MyChart, go to [email or web address].    Your next appointment:   2-3 month(s)  The format for your next  appointment:   In Person  Provider:   You may see Sinclair Grooms, MD or one of the following Advanced Practice Providers on your designated Care Team:   Kathyrn Drown, NP   Other Instructions

## 2021-05-30 ENCOUNTER — Telehealth: Payer: Self-pay | Admitting: *Deleted

## 2021-05-30 NOTE — Telephone Encounter (Signed)
[name] from [employer] in [place] is calling in regards to this pts Asst Application. The fax number the paperwork should have been faxed to is [phone] [place] [facility] Me. Please advise

## 2021-05-30 NOTE — Telephone Encounter (Signed)
Left sleep study appointment on VM.

## 2021-05-30 NOTE — Telephone Encounter (Signed)
Paperwork faxed to [facility] and Me

## 2021-05-31 ENCOUNTER — Encounter (HOSPITAL_COMMUNITY): Payer: Self-pay | Admitting: Internal Medicine

## 2021-05-31 ENCOUNTER — Inpatient Hospital Stay (HOSPITAL_COMMUNITY): Payer: Medicare Other

## 2021-05-31 ENCOUNTER — Emergency Department (HOSPITAL_COMMUNITY): Payer: Medicare Other

## 2021-05-31 ENCOUNTER — Other Ambulatory Visit: Payer: Self-pay

## 2021-05-31 ENCOUNTER — Inpatient Hospital Stay (HOSPITAL_COMMUNITY)
Admission: EM | Admit: 2021-05-31 | Discharge: 2021-05-31 | Disposition: A | Discharge status: Home / Self Care | Payer: Medicare Other | Source: Home / Self Care | Attending: Pulmonary Disease | Admitting: Pulmonary Disease

## 2021-05-31 ENCOUNTER — Inpatient Hospital Stay (HOSPITAL_COMMUNITY)
Admission: EM | Admit: 2021-05-31 | Discharge: 2021-06-12 | DRG: 286 | Disposition: A | Discharge status: Rehabilitation Facility | Payer: Medicare Other | Attending: Internal Medicine | Admitting: Internal Medicine

## 2021-05-31 ENCOUNTER — Encounter (HOSPITAL_COMMUNITY)
Admission: EM | Disposition: A | Discharge status: Rehabilitation Facility | Payer: Self-pay | Source: Home / Self Care | Attending: Family Medicine

## 2021-05-31 DIAGNOSIS — I255 Ischemic cardiomyopathy: Secondary | ICD-10-CM | POA: Diagnosis not present

## 2021-05-31 DIAGNOSIS — I4901 Ventricular fibrillation: Secondary | ICD-10-CM | POA: Diagnosis present

## 2021-05-31 DIAGNOSIS — R570 Cardiogenic shock: Secondary | ICD-10-CM | POA: Diagnosis not present

## 2021-05-31 DIAGNOSIS — E872 Acidosis, unspecified: Secondary | ICD-10-CM

## 2021-05-31 DIAGNOSIS — E785 Hyperlipidemia, unspecified: Secondary | ICD-10-CM | POA: Diagnosis present

## 2021-05-31 DIAGNOSIS — I462 Cardiac arrest due to underlying cardiac condition: Secondary | ICD-10-CM | POA: Diagnosis present

## 2021-05-31 DIAGNOSIS — J690 Pneumonitis due to inhalation of food and vomit: Secondary | ICD-10-CM | POA: Diagnosis not present

## 2021-05-31 DIAGNOSIS — E1140 Type 2 diabetes mellitus with diabetic neuropathy, unspecified: Secondary | ICD-10-CM | POA: Diagnosis present

## 2021-05-31 DIAGNOSIS — I5022 Chronic systolic (congestive) heart failure: Secondary | ICD-10-CM | POA: Diagnosis not present

## 2021-05-31 DIAGNOSIS — I25709 Atherosclerosis of coronary artery bypass graft(s), unspecified, with unspecified angina pectoris: Secondary | ICD-10-CM

## 2021-05-31 DIAGNOSIS — I4821 Permanent atrial fibrillation: Secondary | ICD-10-CM | POA: Diagnosis present

## 2021-05-31 DIAGNOSIS — Z7901 Long term (current) use of anticoagulants: Secondary | ICD-10-CM

## 2021-05-31 DIAGNOSIS — I499 Cardiac arrhythmia, unspecified: Secondary | ICD-10-CM | POA: Diagnosis not present

## 2021-05-31 DIAGNOSIS — Z951 Presence of aortocoronary bypass graft: Secondary | ICD-10-CM

## 2021-05-31 DIAGNOSIS — N179 Acute kidney failure, unspecified: Secondary | ICD-10-CM | POA: Diagnosis not present

## 2021-05-31 DIAGNOSIS — E669 Obesity, unspecified: Secondary | ICD-10-CM | POA: Diagnosis present

## 2021-05-31 DIAGNOSIS — Z79899 Other long term (current) drug therapy: Secondary | ICD-10-CM

## 2021-05-31 DIAGNOSIS — Z8669 Personal history of other diseases of the nervous system and sense organs: Secondary | ICD-10-CM | POA: Diagnosis not present

## 2021-05-31 DIAGNOSIS — E1129 Type 2 diabetes mellitus with other diabetic kidney complication: Secondary | ICD-10-CM | POA: Diagnosis not present

## 2021-05-31 DIAGNOSIS — J449 Chronic obstructive pulmonary disease, unspecified: Secondary | ICD-10-CM | POA: Diagnosis not present

## 2021-05-31 DIAGNOSIS — Y838 Other surgical procedures as the cause of abnormal reaction of the patient, or of later complication, without mention of misadventure at the time of the procedure: Secondary | ICD-10-CM | POA: Diagnosis not present

## 2021-05-31 DIAGNOSIS — I110 Hypertensive heart disease with heart failure: Secondary | ICD-10-CM | POA: Diagnosis present

## 2021-05-31 DIAGNOSIS — I469 Cardiac arrest, cause unspecified: Secondary | ICD-10-CM | POA: Diagnosis not present

## 2021-05-31 DIAGNOSIS — Z811 Family history of alcohol abuse and dependence: Secondary | ICD-10-CM

## 2021-05-31 DIAGNOSIS — I2510 Atherosclerotic heart disease of native coronary artery without angina pectoris: Secondary | ICD-10-CM

## 2021-05-31 DIAGNOSIS — N170 Acute kidney failure with tubular necrosis: Secondary | ICD-10-CM | POA: Diagnosis not present

## 2021-05-31 DIAGNOSIS — R7989 Other specified abnormal findings of blood chemistry: Secondary | ICD-10-CM | POA: Diagnosis present

## 2021-05-31 DIAGNOSIS — Z7951 Long term (current) use of inhaled steroids: Secondary | ICD-10-CM

## 2021-05-31 DIAGNOSIS — Z950 Presence of cardiac pacemaker: Secondary | ICD-10-CM

## 2021-05-31 DIAGNOSIS — R42 Dizziness and giddiness: Secondary | ICD-10-CM | POA: Diagnosis not present

## 2021-05-31 DIAGNOSIS — R092 Respiratory arrest: Secondary | ICD-10-CM | POA: Diagnosis not present

## 2021-05-31 DIAGNOSIS — Z20822 Contact with and (suspected) exposure to covid-19: Secondary | ICD-10-CM | POA: Diagnosis present

## 2021-05-31 DIAGNOSIS — F17210 Nicotine dependence, cigarettes, uncomplicated: Secondary | ICD-10-CM | POA: Diagnosis present

## 2021-05-31 DIAGNOSIS — D696 Thrombocytopenia, unspecified: Secondary | ICD-10-CM | POA: Diagnosis not present

## 2021-05-31 DIAGNOSIS — M1990 Unspecified osteoarthritis, unspecified site: Secondary | ICD-10-CM | POA: Diagnosis present

## 2021-05-31 DIAGNOSIS — Z452 Encounter for adjustment and management of vascular access device: Secondary | ICD-10-CM

## 2021-05-31 DIAGNOSIS — D6489 Other specified anemias: Secondary | ICD-10-CM | POA: Diagnosis not present

## 2021-05-31 DIAGNOSIS — I4819 Other persistent atrial fibrillation: Secondary | ICD-10-CM

## 2021-05-31 DIAGNOSIS — I25810 Atherosclerosis of coronary artery bypass graft(s) without angina pectoris: Secondary | ICD-10-CM | POA: Diagnosis present

## 2021-05-31 DIAGNOSIS — F32A Depression, unspecified: Secondary | ICD-10-CM | POA: Diagnosis not present

## 2021-05-31 DIAGNOSIS — I4891 Unspecified atrial fibrillation: Secondary | ICD-10-CM | POA: Diagnosis not present

## 2021-05-31 DIAGNOSIS — R404 Transient alteration of awareness: Secondary | ICD-10-CM | POA: Diagnosis not present

## 2021-05-31 DIAGNOSIS — Z800 Family history of malignant neoplasm of digestive organs: Secondary | ICD-10-CM

## 2021-05-31 DIAGNOSIS — Z6831 Body mass index (BMI) 31.0-31.9, adult: Secondary | ICD-10-CM | POA: Diagnosis not present

## 2021-05-31 DIAGNOSIS — E1152 Type 2 diabetes mellitus with diabetic peripheral angiopathy with gangrene: Secondary | ICD-10-CM | POA: Diagnosis present

## 2021-05-31 DIAGNOSIS — Z8679 Personal history of other diseases of the circulatory system: Secondary | ICD-10-CM

## 2021-05-31 DIAGNOSIS — I97638 Postprocedural hematoma of a circulatory system organ or structure following other circulatory system procedure: Secondary | ICD-10-CM | POA: Diagnosis not present

## 2021-05-31 DIAGNOSIS — I25118 Atherosclerotic heart disease of native coronary artery with other forms of angina pectoris: Secondary | ICD-10-CM | POA: Diagnosis not present

## 2021-05-31 DIAGNOSIS — M549 Dorsalgia, unspecified: Secondary | ICD-10-CM | POA: Diagnosis not present

## 2021-05-31 DIAGNOSIS — R4189 Other symptoms and signs involving cognitive functions and awareness: Secondary | ICD-10-CM | POA: Diagnosis present

## 2021-05-31 DIAGNOSIS — I5020 Unspecified systolic (congestive) heart failure: Secondary | ICD-10-CM | POA: Diagnosis not present

## 2021-05-31 DIAGNOSIS — J9601 Acute respiratory failure with hypoxia: Secondary | ICD-10-CM | POA: Diagnosis present

## 2021-05-31 DIAGNOSIS — S0990XA Unspecified injury of head, initial encounter: Secondary | ICD-10-CM | POA: Diagnosis not present

## 2021-05-31 DIAGNOSIS — Z813 Family history of other psychoactive substance abuse and dependence: Secondary | ICD-10-CM

## 2021-05-31 DIAGNOSIS — Z8371 Family history of colonic polyps: Secondary | ICD-10-CM

## 2021-05-31 DIAGNOSIS — I441 Atrioventricular block, second degree: Secondary | ICD-10-CM | POA: Diagnosis not present

## 2021-05-31 DIAGNOSIS — I517 Cardiomegaly: Secondary | ICD-10-CM | POA: Diagnosis not present

## 2021-05-31 DIAGNOSIS — I472 Ventricular tachycardia: Secondary | ICD-10-CM

## 2021-05-31 DIAGNOSIS — G931 Anoxic brain damage, not elsewhere classified: Secondary | ICD-10-CM | POA: Diagnosis not present

## 2021-05-31 DIAGNOSIS — R739 Hyperglycemia, unspecified: Secondary | ICD-10-CM

## 2021-05-31 DIAGNOSIS — M47814 Spondylosis without myelopathy or radiculopathy, thoracic region: Secondary | ICD-10-CM | POA: Diagnosis not present

## 2021-05-31 DIAGNOSIS — I96 Gangrene, not elsewhere classified: Secondary | ICD-10-CM | POA: Diagnosis not present

## 2021-05-31 DIAGNOSIS — T8089XA Other complications following infusion, transfusion and therapeutic injection, initial encounter: Secondary | ICD-10-CM | POA: Diagnosis not present

## 2021-05-31 DIAGNOSIS — R079 Chest pain, unspecified: Secondary | ICD-10-CM | POA: Diagnosis not present

## 2021-05-31 DIAGNOSIS — E119 Type 2 diabetes mellitus without complications: Secondary | ICD-10-CM | POA: Diagnosis present

## 2021-05-31 DIAGNOSIS — I5042 Chronic combined systolic (congestive) and diastolic (congestive) heart failure: Secondary | ICD-10-CM | POA: Diagnosis present

## 2021-05-31 DIAGNOSIS — Z743 Need for continuous supervision: Secondary | ICD-10-CM | POA: Diagnosis not present

## 2021-05-31 DIAGNOSIS — Z825 Family history of asthma and other chronic lower respiratory diseases: Secondary | ICD-10-CM

## 2021-05-31 DIAGNOSIS — F419 Anxiety disorder, unspecified: Secondary | ICD-10-CM | POA: Diagnosis present

## 2021-05-31 DIAGNOSIS — E1165 Type 2 diabetes mellitus with hyperglycemia: Secondary | ICD-10-CM | POA: Diagnosis not present

## 2021-05-31 HISTORY — DX: Cardiac arrest, cause unspecified: I46.9

## 2021-05-31 HISTORY — DX: Ischemic cardiomyopathy: I25.5

## 2021-05-31 HISTORY — DX: Permanent atrial fibrillation: I48.21

## 2021-05-31 HISTORY — PX: LEFT HEART CATH AND CORONARY ANGIOGRAPHY: CATH118249

## 2021-05-31 HISTORY — DX: Ventricular fibrillation: I49.01

## 2021-05-31 HISTORY — DX: Atherosclerosis of coronary artery bypass graft(s) without angina pectoris: I25.810

## 2021-05-31 LAB — I-STAT ARTERIAL BLOOD GAS, ED
Acid-base deficit: 16 mmol/L — ABNORMAL HIGH (ref 0.0–2.0)
Acid-base deficit: 11 mmol/L — ABNORMAL HIGH (ref 0.0–2.0)
Acid-base deficit: 7 mmol/L — ABNORMAL HIGH (ref 0.0–2.0)
Acid-base deficit: 8 mmol/L — ABNORMAL HIGH (ref 0.0–2.0)
Bicarbonate: 13.5 mmol/L — ABNORMAL LOW (ref 20.0–28.0)
Bicarbonate: 17.8 mmol/L — ABNORMAL LOW (ref 20.0–28.0)
Bicarbonate: 21 mmol/L (ref 20.0–28.0)
Bicarbonate: 17.6 mmol/L — ABNORMAL LOW (ref 20.0–28.0)
Calcium, Ion: 1.62 mmol/L (ref 1.15–1.40)
Calcium, Ion: 1.55 mmol/L (ref 1.15–1.40)
Calcium, Ion: 1.51 mmol/L (ref 1.15–1.40)
Calcium, Ion: 1.53 mmol/L (ref 1.15–1.40)
HCT: 39 % (ref 39.0–52.0)
HCT: 40 % (ref 39.0–52.0)
HCT: 42 % (ref 39.0–52.0)
HCT: 43 % (ref 39.0–52.0)
Hemoglobin: 13.3 g/dL (ref 13.0–17.0)
Hemoglobin: 13.6 g/dL (ref 13.0–17.0)
Hemoglobin: 14.3 g/dL (ref 13.0–17.0)
Hemoglobin: 14.6 g/dL (ref 13.0–17.0)
O2 Saturation: 100 %
O2 Saturation: 100 %
O2 Saturation: 100 %
O2 Saturation: 99 %
Patient temperature: 95.7
Patient temperature: 98.2
Patient temperature: 98.6
Patient temperature: 37.5
Potassium: 5.9 mmol/L — ABNORMAL HIGH (ref 3.5–5.1)
Potassium: 5.5 mmol/L — ABNORMAL HIGH (ref 3.5–5.1)
Potassium: 4.5 mmol/L (ref 3.5–5.1)
Potassium: 4 mmol/L (ref 3.5–5.1)
Sodium: 135 mmol/L (ref 135–145)
Sodium: 138 mmol/L (ref 135–145)
Sodium: 141 mmol/L (ref 135–145)
Sodium: 140 mmol/L (ref 135–145)
TCO2: 15 mmol/L — ABNORMAL LOW (ref 22–32)
TCO2: 19 mmol/L — ABNORMAL LOW (ref 22–32)
TCO2: 23 mmol/L (ref 22–32)
TCO2: 19 mmol/L — ABNORMAL LOW (ref 22–32)
pCO2 arterial: 43.5 mmHg (ref 32.0–48.0)
pCO2 arterial: 48.5 mmHg — ABNORMAL HIGH (ref 32.0–48.0)
pCO2 arterial: 51.7 mmHg — ABNORMAL HIGH (ref 32.0–48.0)
pCO2 arterial: 38.2 mmHg (ref 32.0–48.0)
pH, Arterial: 7.088 — CL (ref 7.350–7.450)
pH, Arterial: 7.172 — CL (ref 7.350–7.450)
pH, Arterial: 7.216 — ABNORMAL LOW (ref 7.350–7.450)
pH, Arterial: 7.275 — ABNORMAL LOW (ref 7.350–7.450)
pO2, Arterial: 235 mmHg — ABNORMAL HIGH (ref 83.0–108.0)
pO2, Arterial: 291 mmHg — ABNORMAL HIGH (ref 83.0–108.0)
pO2, Arterial: 383 mmHg — ABNORMAL HIGH (ref 83.0–108.0)
pO2, Arterial: 150 mmHg — ABNORMAL HIGH (ref 83.0–108.0)

## 2021-05-31 LAB — CBC WITH DIFFERENTIAL/PLATELET
Abs Immature Granulocytes: 0 10*3/uL (ref 0.00–0.07)
Basophils Absolute: 0 10*3/uL (ref 0.0–0.1)
Basophils Relative: 0 %
Eosinophils Absolute: 1.1 10*3/uL — ABNORMAL HIGH (ref 0.0–0.5)
Eosinophils Relative: 5 %
HCT: 44.6 % (ref 39.0–52.0)
Hemoglobin: 13.2 g/dL (ref 13.0–17.0)
Lymphocytes Relative: 30 %
Lymphs Abs: 6.4 10*3/uL — ABNORMAL HIGH (ref 0.7–4.0)
MCH: 31.6 pg (ref 26.0–34.0)
MCHC: 29.6 g/dL — ABNORMAL LOW (ref 30.0–36.0)
MCV: 106.7 fL — ABNORMAL HIGH (ref 80.0–100.0)
Monocytes Absolute: 0.6 10*3/uL (ref 0.1–1.0)
Monocytes Relative: 3 %
Neutro Abs: 13.1 10*3/uL — ABNORMAL HIGH (ref 1.7–7.7)
Neutrophils Relative %: 62 %
Platelets: 199 10*3/uL (ref 150–400)
RBC: 4.18 MIL/uL — ABNORMAL LOW (ref 4.22–5.81)
RDW: 12.6 % (ref 11.5–15.5)
WBC: 21.2 10*3/uL — ABNORMAL HIGH (ref 4.0–10.5)
nRBC: 0 /100 WBC
nRBC: 0.6 % — ABNORMAL HIGH (ref 0.0–0.2)

## 2021-05-31 LAB — I-STAT CHEM 8, ED
BUN: 32 mg/dL — ABNORMAL HIGH (ref 8–23)
BUN: 32 mg/dL — ABNORMAL HIGH (ref 8–23)
Calcium, Ion: 1.77 mmol/L (ref 1.15–1.40)
Calcium, Ion: 1.54 mmol/L (ref 1.15–1.40)
Chloride: 110 mmol/L (ref 98–111)
Chloride: 112 mmol/L — ABNORMAL HIGH (ref 98–111)
Creatinine, Ser: 1.8 mg/dL — ABNORMAL HIGH (ref 0.61–1.24)
Creatinine, Ser: 1.9 mg/dL — ABNORMAL HIGH (ref 0.61–1.24)
Glucose, Bld: 522 mg/dL (ref 70–99)
Glucose, Bld: 192 mg/dL — ABNORMAL HIGH (ref 70–99)
HCT: 38 % — ABNORMAL LOW (ref 39.0–52.0)
HCT: 41 % (ref 39.0–52.0)
Hemoglobin: 12.9 g/dL — ABNORMAL LOW (ref 13.0–17.0)
Hemoglobin: 13.9 g/dL (ref 13.0–17.0)
Potassium: 5.1 mmol/L (ref 3.5–5.1)
Potassium: 4.5 mmol/L (ref 3.5–5.1)
Sodium: 136 mmol/L (ref 135–145)
Sodium: 140 mmol/L (ref 135–145)
TCO2: 13 mmol/L — ABNORMAL LOW (ref 22–32)
TCO2: 22 mmol/L (ref 22–32)

## 2021-05-31 LAB — COMPREHENSIVE METABOLIC PANEL
ALT: 100 U/L — ABNORMAL HIGH (ref 0–44)
AST: 111 U/L — ABNORMAL HIGH (ref 15–41)
Albumin: 2.5 g/dL — ABNORMAL LOW (ref 3.5–5.0)
Alkaline Phosphatase: 61 U/L (ref 38–126)
BUN: 23 mg/dL (ref 8–23)
CO2: <7 mmol/L — ABNORMAL LOW (ref 22–32)
Calcium: 13.1 mg/dL (ref 8.9–10.3)
Chloride: 107 mmol/L (ref 98–111)
Creatinine, Ser: 2.03 mg/dL — ABNORMAL HIGH (ref 0.61–1.24)
GFR, Estimated: 35 mL/min — ABNORMAL LOW (ref >60)
Glucose, Bld: 546 mg/dL (ref 70–99)
Potassium: 5.9 mmol/L — ABNORMAL HIGH (ref 3.5–5.1)
Sodium: 138 mmol/L (ref 135–145)
Total Bilirubin: 0.9 mg/dL (ref 0.3–1.2)
Total Protein: 4.1 g/dL — ABNORMAL LOW (ref 6.5–8.1)

## 2021-05-31 LAB — TROPONIN I (HIGH SENSITIVITY): Troponin I (High Sensitivity): 1309 ng/L (ref <18)

## 2021-05-31 LAB — CBG MONITORING, ED: Glucose-Capillary: 198 mg/dL — ABNORMAL HIGH (ref 70–99)

## 2021-05-31 SURGERY — LEFT HEART CATH AND CORONARY ANGIOGRAPHY
Anesthesia: LOCAL

## 2021-05-31 MED ORDER — LIDOCAINE HCL (PF) 1 % IJ SOLN
INTRAMUSCULAR | Status: AC
  Filled 2021-05-31: qty 30

## 2021-05-31 MED ORDER — DEXTROSE 50 % IV SOLN
INTRAVENOUS | Status: AC | PRN
  Administered 2021-05-31: 25 g via INTRAVENOUS

## 2021-05-31 MED ORDER — SODIUM CHLORIDE 0.9 % IV SOLN: Freq: Once | INTRAVENOUS | Status: AC

## 2021-05-31 MED ORDER — SODIUM BICARBONATE 8.4 % IV SOLN
INTRAVENOUS | Status: DC
  Filled 2021-05-31: qty 1000

## 2021-05-31 MED ORDER — SODIUM CHLORIDE 0.9 % IV SOLN
INTRAVENOUS | Status: AC | PRN
  Administered 2021-05-31: 1000 mL via INTRAVENOUS

## 2021-05-31 MED ORDER — SODIUM CHLORIDE 0.9 % IV SOLN: INTRAVENOUS | Status: DC

## 2021-05-31 MED ORDER — DEXTROSE 5 % IV SOLN
INTRAVENOUS | Status: AC | PRN
  Administered 2021-05-31: 360 mg via INTRAVENOUS

## 2021-05-31 MED ORDER — MIDAZOLAM HCL 2 MG/2ML IJ SOLN
INTRAMUSCULAR | Status: AC
  Filled 2021-05-31: qty 2

## 2021-05-31 MED ORDER — ATORVASTATIN CALCIUM 80 MG PO TABS
80.0000 mg | ORAL_TABLET | Freq: Every day | ORAL | Status: DC
  Administered 2021-06-01 – 2021-06-12 (×12): 80 mg via ORAL
  Filled 2021-05-31 (×12): qty 1

## 2021-05-31 MED ORDER — AMIODARONE HCL IN DEXTROSE 360-4.14 MG/200ML-% IV SOLN: 60.0000 mg/h | INTRAVENOUS | Status: DC

## 2021-05-31 MED ORDER — SODIUM BICARBONATE 8.4 % IV SOLN
INTRAVENOUS | Status: AC
  Filled 2021-05-31: qty 1000

## 2021-05-31 MED ORDER — SODIUM CHLORIDE 0.9% FLUSH
3.0000 mL | INTRAVENOUS | Status: DC | PRN
  Administered 2021-06-11: 3 mL via INTRAVENOUS

## 2021-05-31 MED ORDER — AMIODARONE HCL IN DEXTROSE 360-4.14 MG/200ML-% IV SOLN
INTRAVENOUS | Status: AC
  Administered 2021-05-31: 4 mg/h
  Filled 2021-05-31: qty 200

## 2021-05-31 MED ORDER — FENTANYL 2500MCG IN NS 250ML (10MCG/ML) PREMIX INFUSION
25.0000 ug/h | INTRAVENOUS | Status: DC
Start: 2021-06-01 — End: 2021-06-01
  Filled 2021-05-31: qty 250

## 2021-05-31 MED ORDER — FAMOTIDINE IN NACL 20-0.9 MG/50ML-% IV SOLN
20.0000 mg | Freq: Two times a day (BID) | INTRAVENOUS | Status: DC
  Administered 2021-06-01 – 2021-06-02 (×4): 20 mg via INTRAVENOUS
  Filled 2021-05-31 (×5): qty 50

## 2021-05-31 MED ORDER — SODIUM CHLORIDE 0.9 % IV SOLN: 250.0000 mL | INTRAVENOUS | Status: DC | PRN

## 2021-05-31 MED ORDER — ONDANSETRON HCL 4 MG/2ML IJ SOLN
4.0000 mg | Freq: Four times a day (QID) | INTRAMUSCULAR | Status: DC | PRN
  Administered 2021-05-31: 4 mg via INTRAVENOUS
  Filled 2021-05-31: qty 2

## 2021-05-31 MED ORDER — SODIUM CHLORIDE 0.9 % IV SOLN: 250.0000 mL | INTRAVENOUS | Status: DC

## 2021-05-31 MED ORDER — FENTANYL CITRATE (PF) 100 MCG/2ML IJ SOLN: 25.0000 ug | Freq: Once | INTRAMUSCULAR | Status: DC

## 2021-05-31 MED ORDER — INSULIN ASPART 100 UNIT/ML IJ SOLN
0.0000 [IU] | INTRAMUSCULAR | Status: DC
  Administered 2021-06-01 – 2021-06-05 (×3): 1 [IU] via SUBCUTANEOUS

## 2021-05-31 MED ORDER — FENTANYL CITRATE (PF) 100 MCG/2ML IJ SOLN
50.0000 ug | Freq: Once | INTRAMUSCULAR | Status: AC
  Administered 2021-05-31: 50 ug via INTRAVENOUS

## 2021-05-31 MED ORDER — FENTANYL BOLUS VIA INFUSION
25.0000 ug | INTRAVENOUS | Status: DC | PRN
  Filled 2021-05-31: qty 100

## 2021-05-31 MED ORDER — LIDOCAINE HCL (PF) 1 % IJ SOLN
INTRAMUSCULAR | Status: DC | PRN
  Administered 2021-05-31: 10 mL

## 2021-05-31 MED ORDER — LABETALOL HCL 5 MG/ML IV SOLN: 10.0000 mg | INTRAVENOUS | Status: DC | PRN

## 2021-05-31 MED ORDER — SODIUM CHLORIDE 0.9 % IV BOLUS: 1000.0000 mL | Freq: Once | INTRAVENOUS | Status: DC

## 2021-05-31 MED ORDER — SODIUM BICARBONATE 8.4 % IV SOLN
INTRAVENOUS | Status: AC | PRN
  Administered 2021-05-31: 50 meq via INTRAVENOUS

## 2021-05-31 MED ORDER — ACETAMINOPHEN 325 MG PO TABS: 650.0000 mg | ORAL_TABLET | ORAL | Status: DC | PRN

## 2021-05-31 MED ORDER — SODIUM CHLORIDE 0.9 % IV SOLN
3.0000 g | Freq: Four times a day (QID) | INTRAVENOUS | Status: DC
  Filled 2021-05-31 (×8): qty 8

## 2021-05-31 MED ORDER — HEPARIN (PORCINE) IN NACL 1000-0.9 UT/500ML-% IV SOLN
INTRAVENOUS | Status: AC
  Filled 2021-05-31: qty 1500

## 2021-05-31 MED ORDER — AMIODARONE HCL IN DEXTROSE 360-4.14 MG/200ML-% IV SOLN
INTRAVENOUS | Status: AC
  Filled 2021-05-31: qty 200

## 2021-05-31 MED ORDER — AMIODARONE HCL IN DEXTROSE 360-4.14 MG/200ML-% IV SOLN
30.0000 mg/h | INTRAVENOUS | Status: DC
  Administered 2021-05-31 – 2021-06-03 (×5): 30 mg/h via INTRAVENOUS
  Filled 2021-05-31 (×6): qty 200

## 2021-05-31 MED ORDER — SODIUM CHLORIDE 0.9 % IV SOLN
1.5000 g | Freq: Four times a day (QID) | INTRAVENOUS | Status: DC
  Administered 2021-06-01 – 2021-06-02 (×6): 1.5 g via INTRAVENOUS
  Filled 2021-05-31: qty 1.5
  Filled 2021-05-31 (×7): qty 4

## 2021-05-31 MED ORDER — HEPARIN (PORCINE) 25000 UT/250ML-% IV SOLN
1250.0000 [IU]/h | INTRAVENOUS | Status: DC
  Filled 2021-05-31: qty 250

## 2021-05-31 MED ORDER — HYDRALAZINE HCL 20 MG/ML IJ SOLN: 10.0000 mg | INTRAMUSCULAR | Status: DC | PRN

## 2021-05-31 MED ORDER — MIDAZOLAM HCL 2 MG/2ML IJ SOLN: 1.0000 mg | INTRAMUSCULAR | Status: DC | PRN

## 2021-05-31 MED ORDER — SODIUM BICARBONATE 8.4 % IV SOLN
999.0000 mL/h | Freq: Once | INTRAVENOUS | Status: DC
  Filled 2021-05-31: qty 1000

## 2021-05-31 MED ORDER — SODIUM BICARBONATE 8.4 % IV SOLN
INTRAVENOUS | Status: AC | PRN
  Administered 2021-05-31: 100 meq via INTRAVENOUS

## 2021-05-31 MED ORDER — CALCIUM CHLORIDE 10 % IV SOLN
INTRAVENOUS | Status: AC
  Filled 2021-05-31: qty 10

## 2021-05-31 MED ORDER — EPINEPHRINE 1 MG/10ML IJ SOSY
PREFILLED_SYRINGE | INTRAMUSCULAR | Status: AC | PRN
  Administered 2021-05-31 (×4): 1 mg via INTRAVENOUS

## 2021-05-31 MED ORDER — SODIUM BICARBONATE 8.4 % IV SOLN: 50.0000 meq | Freq: Once | INTRAVENOUS | Status: DC

## 2021-05-31 MED ORDER — NOREPINEPHRINE 4 MG/250ML-% IV SOLN
2.0000 ug/min | INTRAVENOUS | Status: DC
  Administered 2021-06-01: 5 ug/min via INTRAVENOUS
  Filled 2021-05-31: qty 250

## 2021-05-31 MED ORDER — SODIUM CHLORIDE 0.9% FLUSH
3.0000 mL | Freq: Two times a day (BID) | INTRAVENOUS | Status: DC
  Administered 2021-06-01 – 2021-06-12 (×20): 3 mL via INTRAVENOUS

## 2021-05-31 MED ORDER — LIDOCAINE IN D5W 4-5 MG/ML-% IV SOLN
INTRAVENOUS | Status: AC | PRN
  Administered 2021-05-31: 4 mg/min via INTRAVENOUS

## 2021-05-31 MED ORDER — NOREPINEPHRINE 4 MG/250ML-% IV SOLN
0.0000 ug/min | INTRAVENOUS | Status: DC
  Administered 2021-05-31: 30 ug/min via INTRAVENOUS
  Filled 2021-05-31: qty 250

## 2021-05-31 MED ORDER — ASPIRIN 81 MG PO CHEW
81.0000 mg | CHEWABLE_TABLET | Freq: Every day | ORAL | Status: DC
  Administered 2021-06-01 – 2021-06-12 (×12): 81 mg via ORAL
  Filled 2021-05-31 (×12): qty 1

## 2021-05-31 MED ORDER — FENTANYL BOLUS VIA INFUSION
25.0000 ug | INTRAVENOUS | Status: DC | PRN
  Administered 2021-05-31: 100 ug via INTRAVENOUS
  Filled 2021-05-31: qty 100

## 2021-05-31 MED ORDER — FENTANYL 2500MCG IN NS 250ML (10MCG/ML) PREMIX INFUSION
25.0000 ug/h | INTRAVENOUS | Status: DC
Start: 2021-05-31 — End: 2021-06-10
  Filled 2021-05-31: qty 250

## 2021-05-31 MED ORDER — ASPIRIN 300 MG RE SUPP: 300.0000 mg | RECTAL | Status: AC

## 2021-05-31 MED ORDER — AMIODARONE HCL 150 MG/3ML IV SOLN
INTRAVENOUS | Status: AC | PRN
  Administered 2021-05-31: 300 mg via INTRAVENOUS

## 2021-05-31 MED ORDER — NOREPINEPHRINE 4 MG/250ML-% IV SOLN: 0.0000 ug/min | INTRAVENOUS | Status: DC

## 2021-05-31 MED ORDER — HEPARIN (PORCINE) IN NACL 1000-0.9 UT/500ML-% IV SOLN
INTRAVENOUS | Status: DC | PRN
  Administered 2021-05-31 (×3): 500 mL

## 2021-05-31 MED ORDER — MAGNESIUM SULFATE 50 % IJ SOLN
INTRAMUSCULAR | Status: AC | PRN
  Administered 2021-05-31: 2 g via INTRAVENOUS

## 2021-05-31 MED ORDER — LIDOCAINE IN D5W 4-5 MG/ML-% IV SOLN
1.0000 mg/min | INTRAVENOUS | Status: DC
  Filled 2021-05-31: qty 500

## 2021-05-31 MED ORDER — NOREPINEPHRINE 4 MG/250ML-% IV SOLN
INTRAVENOUS | Status: AC
  Administered 2021-05-31: 4 ug/kg/min
  Filled 2021-05-31: qty 250

## 2021-05-31 MED ORDER — IOHEXOL 350 MG/ML SOLN
INTRAVENOUS | Status: DC | PRN
  Administered 2021-05-31: 65 mL

## 2021-05-31 MED ORDER — HEPARIN (PORCINE) IN NACL 1000-0.9 UT/500ML-% IV SOLN
INTRAVENOUS | Status: AC
  Filled 2021-05-31: qty 1000

## 2021-05-31 MED ORDER — CALCIUM CHLORIDE 10 % IV SOLN
INTRAVENOUS | Status: AC | PRN
  Administered 2021-05-31 (×2): 1 g via INTRAVENOUS

## 2021-05-31 MED ORDER — FENTANYL 2500MCG IN NS 250ML (10MCG/ML) PREMIX INFUSION
25.0000 ug/h | INTRAVENOUS | Status: DC
  Administered 2021-05-31: 50 ug/h via INTRAVENOUS

## 2021-05-31 MED ORDER — PROPOFOL 1000 MG/100ML IV EMUL
INTRAVENOUS | Status: AC | PRN
  Administered 2021-05-31: 100 ug via INTRAVENOUS

## 2021-05-31 MED ORDER — MIDAZOLAM HCL 2 MG/2ML IJ SOLN
INTRAMUSCULAR | Status: DC | PRN
  Administered 2021-05-31: 1 mg via INTRAVENOUS

## 2021-05-31 MED ORDER — FAMOTIDINE IN NACL 20-0.9 MG/50ML-% IV SOLN: 20.0000 mg | Freq: Two times a day (BID) | INTRAVENOUS | Status: DC

## 2021-05-31 MED ORDER — FENTANYL BOLUS VIA INFUSION
25.0000 ug | INTRAVENOUS | Status: DC | PRN
  Filled 2021-05-31: qty 25

## 2021-05-31 MED ORDER — FENTANYL CITRATE (PF) 100 MCG/2ML IJ SOLN
INTRAMUSCULAR | Status: AC
  Filled 2021-05-31: qty 2

## 2021-05-31 SURGICAL SUPPLY — 13 items
CATH INFINITI 5 FR IM (CATHETERS) ×1 IMPLANT
CATH INFINITI 5 FR LCB (CATHETERS) ×1 IMPLANT
CATH INFINITI 5FR MULTPACK ANG (CATHETERS) ×1 IMPLANT
GLIDESHEATH SLEND SS 6F .021 (SHEATH) IMPLANT
GUIDEWIRE INQWIRE 1.5J.035X260 (WIRE) IMPLANT
INQWIRE 1.5J .035X260CM (WIRE)
KIT HEART LEFT (KITS) ×2 IMPLANT
MAT PREVALON FULL STRYKER (MISCELLANEOUS) ×1 IMPLANT
PACK CARDIAC CATHETERIZATION (CUSTOM PROCEDURE TRAY) ×2 IMPLANT
SHEATH PINNACLE 6F 10CM (SHEATH) ×1 IMPLANT
SYR MEDRAD MARK 7 150ML (SYRINGE) ×2 IMPLANT
TRANSDUCER W/STOPCOCK (MISCELLANEOUS) ×2 IMPLANT
TUBING CIL FLEX 10 FLL-RA (TUBING) ×2 IMPLANT

## 2021-05-31 NOTE — Code Documentation (Signed)
Pulse check, pt continues on VF on monitor, 360J shock given, continues CPR.

## 2021-05-31 NOTE — Code Documentation (Signed)
Propofol paused due to low BP

## 2021-05-31 NOTE — Progress Notes (Signed)
ANTICOAGULATION CONSULT NOTE - Initial Consult  Pharmacy Consult for heparin Indication: Afib  No Known Allergies  Patient Measurements: Height: 6' (182.9 cm) Weight: 100 kg (220 lb 7.4 oz) IBW/kg (Calculated) : 77.6 Heparin Dosing Weight: 97.9  Vital Signs: Temp: 98.5 F (36.9 C) (06/11 1910) Temp Source: Temporal (06/11 1549) BP: 121/74 (06/11 1933) Pulse Rate: 59 (06/11 1930)  Labs: Recent Labs    05/31/21 1633 05/31/21 1634 05/31/21 1733 05/31/21 1855 05/31/21 2032  HGB 13.2 12.9* 13.3 13.6 14.3  HCT 44.6 38.0* 39.0 40.0 42.0  PLT 199  --   --   --   --   CREATININE 2.03* 1.80*  --   --   --   TROPONINIHS 1,309*  --   --   --   --      Estimated Creatinine Clearance: 49.4 mL/min (A) (by C-G formula based on SCr of 1.8 mg/dL (H)).   Assessment: 65 YO M on apixaban PTA for Afib, last dose unknown at this time given patient arrived in ED undergoing CPR for Vfib for approximately 2 hours. 6/11 Cath showed stable coronaries compared to 2018. Pharmacy consulted to start IV heparin while holding apixaban for procedures.    H/H stable, plt wnl. Sheath kept after cath for access and BP monitoring. Heparin to start 8 hours after sheath pull.   Goal of Therapy:  Heparin level 0.3-0.7 units/ml Monitor platelets by anticoagulation protocol: Yes   Plan:  F/u when sheath is pulled and start Heparin at 1250 units/hr, no bolus 8 hours after  F/u APTT 8 hours after heparin start  F/u aPTT until correlates with heparin level  Monitor daily APTT, HL, CBC/plt Monitor for signs/symptoms of bleeding  F/u restart apixaban   Benetta Spar, PharmD, BCPS, [facility] Clinical Pharmacist  Please check AMION for all [place] phone numbers After 10:00 PM, call [place]

## 2021-05-31 NOTE — ED Notes (Signed)
Pt's pressure dropping into 70's.  Dr Ashok Cordia notified.  Fluid bolus started and levophed increased to 30 per Dr Ashok Cordia.  Family at bedside - pt's eyes open and looking at family.

## 2021-05-31 NOTE — Consult Note (Signed)
CARDIOLOGY CONSULT NOTE     Primary Care Physician: No primary care provider on file. Referring Physician:  Dr Ashok Cordia  Admit Date: 05/31/2021  Reason for consultation:  cardiac arrest  Ross [name] is a 65 y.o. male with a h/o  Known CAD s/p prior CABG, ischemic CM, and permanent afib who is admitted after cardiac arrest today.  The patient presents with incessant ventricular fibrillation after prolonged CPR this afternoon.  Per ED physician, the patient had witnessed arrest at 14:30 after complaining of dizziness. EMS arrived and CPR was initiated at 14:40.  His presenting rhythm was ventricular fibrillation.  Amiodarone 450mg  IV and lidocaine were administered with more than 10 defibrillation shocks unsuccessful.  CPR was discontinued at 16:15 after external defibrillator shock successfully converted him to a ventricular paced rhythm.  Per [name] notes, he has hx of coronary artery disease status post CABG with his last cath 02/2017 showing an occluded SVG to PDA and high-grade stenosis of the mid RCA not suitable for PCI.  He also has primary hypertension, chronic stable angina, type 2 diabetes, chronic combined systolic and diastolic HF (EF 51%), morbid obesity, mildly dilated aortic root, permanent atrial fibrillation, and a pacemaker placed on 01/16/16.  Most recent echo demonstrated deterioration in LV function to EF 25% March 2022.   Records obtained from his primary chart ([id number])   Past Medical History:  Diagnosis Date   CAD (coronary artery disease) of artery bypass graft    Ischemic cardiomyopathy    Permanent atrial fibrillation [facility])    Past Surgical History:  Procedure Laterality Date   CORONARY ARTERY BYPASS GRAFT       calcium chloride        amiodarone     PMH/PSH/SH/FH/ROS- unable to obtain  Physical Exam: Telemetry:  VF-->V paced Vitals:   05/31/21 1610 05/31/21 1615 05/31/21 1620 05/31/21 1624  BP: (!) 156/15 (!) 161/21 (!) 92/52   Pulse: 97      Resp: (!) 58 (!) 68 (!) 22   Temp:      TempSrc:      SpO2: (!) 88%     Weight:      Height:    6' (1.829 m)    GEN- The patient is in extremis following extensive CPR.  Intubated but does have some purposeful movements Head- normocephalic, atraumatic Oropharynx- ETT Neck- supple,  Lungs- intubated Heart- irregular rate and rhythm  GI- soft,  Extremities- + cyanosis and modeling  MS- no significant deformity or atrophy Skin- no rash or lesion Neuro- + purposeful movements but not interactive    Labs:   Lab Results  Component Value Date   HGB 12.9 (L) 05/31/2021   HCT 38.0 (L) 05/31/2021    Recent Labs  Lab 05/31/21 1634  NA 136  K 5.1  CL 110  BUN 32*  CREATININE 1.80*  GLUCOSE 522*    ASSESSMENT AND PLAN:   VF arrest He has known ischemic CM with chronic systolic dysfunction.  Now presents with refractory ventricular fibrillation.  Continue IV amiodarone.  His prognosis is poor.  Currently too ill for cath.  PCCM to admit.  We will follow closely to see if he makes meaningful recovery.  Dr Ashok Cordia has discussed with the patient's family who feels that we should continue to pursue aggressive care.  Prognosis is poor.  If he makes meaningful recovery, we would consider cardiac cath as the next step.  Critical care time was exclusive of separate billable procedures and treating other  patients.  Critial care time was spent personally by me (independant of midlevel providers) on the following activities: development of treatment plan with patient and/or surrogate as well as nursing, discussions with consultants, evaluation of patients response to treatment, examining patient, obtaining history from patient or surrogate, ordering/ reviewing treatments/ interventions, lab studies, radiographic studies, pulse ox, and re-evaluation of patients condition.     The patient is critically ill with multiple organ systems failure and requires high complexity decision making for  assessment and support, frequent evaluation and titration of therapies, application of advanced monitoring technologies and extensive interpretation of databases.   Critical care was necessary to treat or prevent immintent or life-threatening deterioration.    Total CCT spent directly with the patient today is 1 hour and 15 minutes  Thompson Grayer MD, [facility] 05/31/2021 5:08 PM

## 2021-05-31 NOTE — H&P (Addendum)
NAME:  Ross [name], MRN:  [id number], DOB:  11-30-1956, LOS: 0 ADMISSION DATE:  05/31/2021, CONSULTATION DATE: 05/31/2021 REFERRING MD: Dr. Delane Ginger, CHIEF COMPLAINT: Cardiac arrest  History of Present Illness:  This is a 65 year old gentleman, past medical history of coronary artery disease, ischemic cardiomyopathy, CABG.  Patient followed by Dr. Daneen Schick cardiology office.  History of coronary artery disease status post CABG, last cardiac catheterization in March 2018 with an occluded SVG to PDA and high-grade stenosis of mid RCA not suitable for PCI.  Patient has primary hypertension, chronic stable angina type 2 diabetes and chronic systolic and diastolic heart failure, morbid obesity, dilated aortic root chronic atrial fibrillation dual-chamber pacemaker placed in January 2017.  Most recent echo demonstrated LV function of an EF of 25% in March 2022.  Last seen in the office on 05/29/2021.  Office note from Dr. Tamala Julian reviewed.  Patient was present at home.  He had sudden collapse witnessed by his wife.  He was found unresponsive.  CPR was started.  He was defibrillated greater than 10+ times given 450 mg of IV amiodarone and 2 mg of IV mag.  He remained pulseless CPR with [place] device in place.  Ultimately post cardiac arrest despite maximal amounts of efforts and nearly an hour of resuscitative events.  Pulmonary critical care was consulted for recommendation admission to the intensive care unit.  After all was complete with propofol stopped on low-dose fentanyl.  The patient is awake following commands  Pertinent  Medical History   Past Medical History:  Diagnosis Date   CAD (coronary artery disease) of artery bypass graft    Ischemic cardiomyopathy    Permanent atrial fibrillation ([place])      Significant Hospital Events: Including procedures, antibiotic start and stop dates in addition to other pertinent events   05/31/2021 ICU admission  Interim History / Subjective:  Per HPI above  patient critically ill  Objective   Blood pressure 113/77, pulse 61, temperature (!) 93.1 F (33.9 C), resp. rate (!) 26, height 6' (1.829 m), weight 100 kg, SpO2 100 %.    Vent Mode: PRVC FiO2 (%):  [100 %] 100 % Set Rate:  [16 bmp] 16 bmp Vt Set:  [500 mL-620 mL] 620 mL PEEP:  [5 cmH20] 5 cmH20 Plateau Pressure:  [19 cmH20] 19 cmH20  No intake or output data in the 24 hours ending 05/31/21 1756 Filed Weights   05/31/21 1549  Weight: 100 kg    Examination: General: Middle-aged male intubated on mechanical life support critically ill, obese HENT: NCAT, endotracheal tube in place Lungs: Bilateral mechanically ventilated breath sounds Cardiovascular: Regular rate rhythm, S1-S2 Abdomen: Obese pannus, soft nontender nondistended Extremities: No significant edema Neuro: On low-dose fentanyl.  Patient is awake following commands postarrest GU: Deferred  Labs/imaging that I havepersonally reviewed  (right click and "Reselect all SmartList Selections" daily)  Arterial blood gas pH 7.0/43/235/13.5 BUN 23 Serum creatinine 2 Anion gap 13 Troponin 1300 White blood cell count 21,000  Resolved Hospital Problem list     Assessment & Plan:   Prolonged VT/VF cardiac arrest, out-of-hospital Shock, cardiogenic postarrest Greater than 2 hours in an hour of VT/VF, shock 10+ times.  Multiple rounds of CPR. Metabolic acidosis, lactic acidosis postarrest Plan: Cardiology has been consulted Discuss taking patient for left heart catheterization versus letting his electrolytes and acidosis correct itself. Bicarb infusion started in emergency room Additional amp of sodium bicarbonate Remains on norepinephrine maintain mean arterial pressure greater than 65 mmHg. Amiodarone infusion  continued Lidocaine infusion started Trend troponin Echo pending Appreciate cardiology input Going to need arterial line and central venous access likely.  Acute hypoxemic respiratory failure requiring  intubation mechanical ventilation secondary above Plan: PAD guidelines sedation Fentanyl Propofol Adult mechanical vent protocol  Elevated serum creatinine Plan: Follow urine output and I's and O's Follow kidney function  Leukocytosis Plan: I suspect reactive, stress response postarrest.   Best practice (right click and "Reselect all SmartList Selections" daily)  Diet:  NPO Pain/Anxiety/Delirium protocol (if indicated): Yes (RASS goal -1) VAP protocol (if indicated): Yes DVT prophylaxis: Systemic AC GI prophylaxis: H2B Glucose control:  SSI Yes Central venous access:  N/A Arterial line:  N/A Foley:  Yes, and it is still needed Mobility:  bed rest  PT consulted: N/A Last date of multidisciplinary goals of care discussion [pending] Code Status:  full code Disposition: ICU  Labs   CBC: Recent Labs  Lab 05/31/21 1633 05/31/21 1634 05/31/21 1733  WBC 21.2*  --   --   NEUTROABS PENDING  --   --   HGB 13.2 12.9* 13.3  HCT 44.6 38.0* 39.0  MCV 106.7*  --   --   PLT 199  --   --     Basic Metabolic Panel: Recent Labs  Lab 05/31/21 1634 05/31/21 1733  NA 136 135  K 5.1 5.9*  CL 110  --   GLUCOSE 522*  --   BUN 32*  --   CREATININE 1.80*  --    GFR: Estimated Creatinine Clearance: 49.4 mL/min (A) (by C-G formula based on SCr of 1.8 mg/dL (H)). Recent Labs  Lab 05/31/21 1633  WBC 21.2*    Liver Function Tests: No results for input(s): AST, ALT, ALKPHOS, BILITOT, PROT, ALBUMIN in the last 168 hours. No results for input(s): LIPASE, AMYLASE in the last 168 hours. No results for input(s): AMMONIA in the last 168 hours.  ABG    Component Value Date/Time   PHART 7.088 (LL) 05/31/2021 1733   PCO2ART 43.5 05/31/2021 1733   PO2ART 235 (H) 05/31/2021 1733   HCO3 13.5 (L) 05/31/2021 1733   TCO2 15 (L) 05/31/2021 1733   ACIDBASEDEF 16.0 (H) 05/31/2021 1733   O2SAT 100.0 05/31/2021 1733     Coagulation Profile: No results for input(s): INR, PROTIME in the  last 168 hours.  Cardiac Enzymes: No results for input(s): CKTOTAL, CKMB, CKMBINDEX, TROPONINI in the last 168 hours.  HbA1C: No results found for: HGBA1C  CBG: Recent Labs  Lab 05/31/21 1622  GLUCAP 198*    Review of Systems:   Unable to be obtained secondary to critical illness  Past Medical History:  He,  has a past medical history of CAD (coronary artery disease) of artery bypass graft, Ischemic cardiomyopathy, and Permanent atrial fibrillation ([place]).   Surgical History:   Past Surgical History:  Procedure Laterality Date   CORONARY ARTERY BYPASS GRAFT       Social History:      Family History:  His family history is not on file.   Allergies No Known Allergies   Home Medications  Prior to Admission medications   Not on File     Critical care time:     This patient is critically ill with multiple organ system failure; which, requires frequent high complexity decision making, assessment, support, evaluation, and titration of therapies. This was completed through the application of advanced monitoring technologies and extensive interpretation of multiple databases. During this encounter critical care time was devoted to patient  care services described in this note for 40 minutes.   [facility] 05/31/2021 6:34 PM

## 2021-05-31 NOTE — ED Notes (Signed)
Per Dr. Claiborne Billings order to not start the heparin gtt yet.

## 2021-05-31 NOTE — ED Notes (Signed)
No violent restrain applied

## 2021-05-31 NOTE — Progress Notes (Signed)
Called by critical care medicine this evening as patient had become responsive after prolonged CPR for VT/VF arrest. On exam, patient is following commands and demonstrating purposeful movements. Given history of known CAD s/p CABG and ischemic CM and remarkable clinical improvement, the decision was made to proceed with coronary angiography to assess for acute ischemic etiology for VT/VF. Plan was discussed with patient, his fiance and his daughter as well as with Dr. Claiborne Billings (STEMI attending) and PCCM who agree to proceed.  Will obtain repeat gas prior to cath and give bicarb.  INFORMED CONSENT: I have reviewed the risks, indications, and alternatives to cardiac catheterization, possible angioplasty, and stenting with the patient. Risks include but are not limited to bleeding, infection, vascular injury, stroke, myocardial infection, arrhythmia, kidney injury, radiation-related injury in the case of prolonged fluoroscopy use, emergency cardiac surgery, and death. The patient understands the risks of serious complication is 1-2 in 8208 with diagnostic cardiac cath and 1-2% or less with angioplasty/stenting.    Gwyndolyn Kaufman, MD

## 2021-05-31 NOTE — Code Documentation (Signed)
Pulse check, pt continue VF on monitor, 360J shock given, continue CPR.

## 2021-05-31 NOTE — Progress Notes (Signed)
Pharmacy Antibiotic Note  Ross [name] is a 65 y.o. male admitted on 05/31/2021 with aspiration pneumonia in the setting of CPR.  Pharmacy has been consulted for ampicillin/sulbactam dosing.  Patient achieved ROSC after >90 min code. Clcr ~49 ml/min at this time. WBC elevated. Afebrile.   Plan: Amp/sulb 3g q6 hr Monitor cultures, clinical status, renal fx Narrow abx as able and f/u duration    Height: 6' (182.9 cm) Weight: 100 kg (220 lb 7.4 oz) IBW/kg (Calculated) : 77.6  Temp (24hrs), Avg:96.2 F (35.7 C), Min:93.1 F (33.9 C), Max:98.2 F (36.8 C)  Recent Labs  Lab 05/31/21 1633 05/31/21 1634  WBC 21.2*  --   CREATININE 2.03* 1.80*    Estimated Creatinine Clearance: 49.4 mL/min (A) (by C-G formula based on SCr of 1.8 mg/dL (H)).    No Known Allergies  Antimicrobials this admission: Amp/sulb 6/11 >>    Microbiology results: 6/11 resp panel: pending  Thank you for allowing pharmacy to be a part of this patient's care.  Benetta Spar, PharmD, BCPS, BCCP Clinical Pharmacist  Please check AMION for all [place] phone numbers After 10:00 PM, call [place] [phone]

## 2021-05-31 NOTE — Progress Notes (Signed)
Pt transported to cath lab without event. °

## 2021-05-31 NOTE — ED Notes (Signed)
Pulse check, no pulse return pt continues VF, 30 shock given, continue CPR.

## 2021-05-31 NOTE — ED Notes (Signed)
At change of shift Dr Claiborne Billings come and states to over pass CT and got to the Cath lab stat, ordered IV push bicarb and given, when getting ready to transfer pt to cath lab, order gotten through CN to hold on taking pt to cath lab due to another STEMI arrive, order to hold pt on trauma B until further call from cath lab.

## 2021-05-31 NOTE — Code Documentation (Signed)
Pulse check and and pt on SB 49 on monitor with palpable pulses.

## 2021-05-31 NOTE — Progress Notes (Signed)
eLink Physician-Brief Progress Note Patient Name: Ross [name] DOB: [birth year] MRN: [id number]   Date of Service  05/31/2021  HPI/Events of Note  43M with known ischemic cardiomyopathy s/p CABG, systolic HF (EF 38% in 06/5642), T2DM, HTN, chronic Afib s/p dual chamber PPM.  He was BIBA after witnessed out-of-hospital cardiac arrest at home. Prolonged resuscitation with ultimate restoration of paced rhythm following multiple shocks, amiodarone and levophed infusion.  He went to cardiac cath shortly thereafter which revealed severe native CAD with diffuse severe pLAD stenoses. LIMA-LAD and SVG-1st diag remain patent.  Currently, patient is on an amiodarone drip at 1 mg/min and sodium bicarbonate (isotonic) at 150 mL/hr for correction of his severe metabolic acidosis.  2030 ABG 7.22/52/383/23/BE -7.0  2030 BMP: Cr 1.90, K 4.5, iCal 1.54  Currently, he is alternating between sinus rhythm and V-pacing with rates of around 60 bpm, BP 99/62 (MAP 72) by A-line. Levophed is at 7 mcg/min. Sedation is fentanyl at 125 mcg/hr. Heparin drip.  Vent settings: 24x635mL, 5, 60%   eICU Interventions  # Neuro: - Sedation: fentanyl drip/pushes. - No TTM: he is following commands.  # Cardiac: - VF arrest: in setting of severe diffuse pLAD stenoses. Tx with amiodarone drip, heparin drip, ASA, medical therapy. Has PPM but will need ICD, cardiology following. - Wean levophed as tolerated. Down to 7 mcg/min in setting of acid-base status improving on bicarb drip.  # Resp: -  Continue VC 640x24, 5, 60% for now. - ABG 1-2 hrs after last change in vent settings. - Empiric Unasyn for presumed aspiration coverage.  # Renal: - Metabolic acidosis 2/2 post-arrest state: correction of pH with isotonic bicarbonate drip at 150 mL/hr. Repeat BMP in AM. Suspect bicarb drip can be stopped at that time. - AKI: 2/2 arrest. Monitor.  # Endocrine: - T2DM: SSI Q4H  DVT PPX: Heparin drip GI PPX: Famotidine BID       Intervention Category Evaluation Type: New Patient Evaluation  Charlott Rakes 05/31/2021, 10:39 PM

## 2021-05-31 NOTE — Code Documentation (Signed)
Pulse check, pt continues on VF, 360J shock given, continue cpr.

## 2021-05-31 NOTE — ED Notes (Signed)
Pulse check on arrival, no pulse, pt on refractory VF 360J shock given, continue CPR.

## 2021-05-31 NOTE — Code Documentation (Signed)
cpr paused for pulse check, pt continues on VF, 360J shock given, continue CPR.

## 2021-05-31 NOTE — Code Documentation (Signed)
Pulse check done, pt continues on VF on monitor, 360 J shock given, continue cpr,

## 2021-05-31 NOTE — Progress Notes (Signed)
ANTICOAGULATION CONSULT NOTE - Initial Consult  Pharmacy Consult for heparin Indication: chest pain/ACS  No Known Allergies  Patient Measurements: Height: 6' (182.9 cm) Weight: 100 kg (220 lb 7.4 oz) IBW/kg (Calculated) : 77.6 Heparin Dosing Weight: 97.9  Vital Signs: Temp: 95.9 F (35.5 C) (06/11 1757) Temp Source: Temporal (06/11 1549) BP: 116/74 (06/11 1754) Pulse Rate: 59 (06/11 1756)  Labs: Recent Labs    05/31/21 1633 05/31/21 1634 05/31/21 1733  HGB 13.2 12.9* 13.3  HCT 44.6 38.0* 39.0  PLT 199  --   --   CREATININE 2.03* 1.80*  --   TROPONINIHS 1,309*  --   --     Estimated Creatinine Clearance: 49.4 mL/min (A) (by C-G formula based on SCr of 1.8 mg/dL (H)).   Medical History: Past Medical History:  Diagnosis Date   CAD (coronary artery disease) of artery bypass graft    Ischemic cardiomyopathy    Permanent atrial fibrillation ([facility])     Medications:  (Not in a hospital admission)   Assessment: 45 YOM brought to the ED with cardiac arrest now with concern for STEMI. Pharmacy consulted to start IV heparin.   H/H and Plt wnl. Of note, patient is on Eliquis but unsure when last dose was   Goal of Therapy:  Heparin level 0.3-0.7 units/ml Monitor platelets by anticoagulation protocol: Yes   Plan:  -Heparin infusion at 1250 units/hr. No bolus -F/u after cath this evening   Albertina Parr, PharmD., BCPS, BCCCP Clinical Pharmacist Please refer to [facility] for unit-specific pharmacist

## 2021-05-31 NOTE — Code Documentation (Signed)
Pulse check, pt continues on VF on monitor 360J shock given, continue CPR.

## 2021-05-31 NOTE — ED Provider Notes (Signed)
[facility] EMERGENCY DEPARTMENT Provider Note   CSN: [id number] Arrival date & time: 05/31/21  1553     History Chief Complaint  Patient presents with   Cardiac Arrest    Hendry Speas is a 65 y.o. male.  Patient with hx cad, cabg, ICM, presents via EMS s/p witnessed arrest. Patient had c/o sudden onset dizziness earlier today around 1430 and suddenly collapsed. EMS indicates were called to scene at 1440 and found patient unresponsive, with VT/VF rhythm. They performed cpr, defib x 10+ times, gave amio total 450 mg iv, Mg 2+ iv - they indicate intermittently patient with narrower complex rhythm rate with rate in 40s and 50s, but remained without pulses (pulses only when CPR/Lucas device on) - they indicate patient intermittently moving bilateral extremities, reaching toward tube, lines. Pt unresponsive - level 5 caveat.   The history is provided by the patient, the EMS personnel, a significant other and medical records. The history is limited by the condition of the patient.      Past Medical History:  Diagnosis Date   CAD (coronary artery disease) of artery bypass graft    Ischemic cardiomyopathy    Permanent atrial fibrillation (HCC)     There are no problems to display for this patient.   Past Surgical History:  Procedure Laterality Date   CORONARY ARTERY BYPASS GRAFT         History reviewed. No pertinent family history.     Home Medications Prior to Admission medications   Not on File    Allergies    Patient has no known allergies.  Review of Systems   Review of Systems  Unable to perform ROS: Patient unresponsive   Physical Exam Updated Vital Signs BP 116/74   Pulse (!) 59   Temp (!) 95.9 F (35.5 C)   Resp 18   Ht 1.829 m (6')   Wt 100 kg   SpO2 100%   BMI 29.90 kg/m   Physical Exam Vitals and nursing note reviewed.  Constitutional:      Appearance: He is well-developed.     Comments: Lucas device giving chest  compressions, being bag ventilated via ETT, occasionally reaching up towards face/tube.   HENT:     Head: Atraumatic.     Nose: Nose normal.     Mouth/Throat:     Mouth: Mucous membranes are moist.  Eyes:     General: No scleral icterus.    Conjunctiva/sclera: Conjunctivae normal.     Comments: Pupils equal, ?minimally responsive.  Neck:     Vascular: No carotid bruit.     Trachea: No tracheal deviation.     Comments: Trachea midline Cardiovascular:     Heart sounds: No murmur heard.    Comments: Pulses only with chest compressions.  Pulmonary:     Effort: No accessory muscle usage.     Comments: Bilateral breath sounds w bag ventilation.  Abdominal:     General: There is no distension.     Tenderness: There is no abdominal tenderness.  Genitourinary:    Comments: No cva tenderness. Musculoskeletal:        General: No swelling or tenderness.     Cervical back: Normal range of motion and neck supple. No rigidity.     Comments: IO line LLE.  Skin:    General: Skin is warm and dry.     Findings: No rash.  Neurological:     Comments: Unresponsive. Intubated. Occasional movement bilateral arms moving towards face/tube.  Psychiatric:     Comments: Unresponsive.     ED Results / Procedures / Treatments   Labs (all labs ordered are listed, but only abnormal results are displayed) Results for orders placed or performed during the hospital encounter of 05/31/21  CBC with Differential  Result Value Ref Range   WBC 21.2 (H) 4.0 - 10.5 K/uL   RBC 4.18 (L) 4.22 - 5.81 MIL/uL   Hemoglobin 13.2 13.0 - 17.0 g/dL   HCT 44.6 39.0 - 52.0 %   MCV 106.7 (H) 80.0 - 100.0 fL   MCH 31.6 26.0 - 34.0 pg   MCHC 29.6 (L) 30.0 - 36.0 g/dL   RDW 12.6 11.5 - 15.5 %   Platelets 199 150 - 400 K/uL   nRBC 0.6 (H) 0.0 - 0.2 %   Neutrophils Relative % 62 %   Neutro Abs 13.1 (H) 1.7 - 7.7 K/uL   Lymphocytes Relative 30 %   Lymphs Abs 6.4 (H) 0.7 - 4.0 K/uL   Monocytes Relative 3 %   Monocytes  Absolute 0.6 0.1 - 1.0 K/uL   Eosinophils Relative 5 %   Eosinophils Absolute 1.1 (H) 0.0 - 0.5 K/uL   Basophils Relative 0 %   Basophils Absolute 0.0 0.0 - 0.1 K/uL   WBC Morphology See Note    nRBC 0 0 /100 WBC   Abs Immature Granulocytes 0.00 0.00 - 0.07 K/uL  CBG monitoring, ED  Result Value Ref Range   Glucose-Capillary 198 (H) 70 - 99 mg/dL  I-stat chem 8, ed  Result Value Ref Range   Sodium 136 135 - 145 mmol/L   Potassium 5.1 3.5 - 5.1 mmol/L   Chloride 110 98 - 111 mmol/L   BUN 32 (H) 8 - 23 mg/dL   Creatinine, Ser 1.80 (H) 0.61 - 1.24 mg/dL   Glucose, Bld 522 (HH) 70 - 99 mg/dL   Calcium, Ion 1.77 (HH) 1.15 - 1.40 mmol/L   TCO2 13 (L) 22 - 32 mmol/L   Hemoglobin 12.9 (L) 13.0 - 17.0 g/dL   HCT 38.0 (L) 39.0 - 52.0 %   Comment NOTIFIED PHYSICIAN   I-Stat arterial blood gas, ED  Result Value Ref Range   pH, Arterial 7.088 (LL) 7.350 - 7.450   pCO2 arterial 43.5 32.0 - 48.0 mmHg   pO2, Arterial 235 (H) 83.0 - 108.0 mmHg   Bicarbonate 13.5 (L) 20.0 - 28.0 mmol/L   TCO2 15 (L) 22 - 32 mmol/L   O2 Saturation 100.0 %   Acid-base deficit 16.0 (H) 0.0 - 2.0 mmol/L   Sodium 135 135 - 145 mmol/L   Potassium 5.9 (H) 3.5 - 5.1 mmol/L   Calcium, Ion 1.62 (HH) 1.15 - 1.40 mmol/L   HCT 39.0 39.0 - 52.0 %   Hemoglobin 13.3 13.0 - 17.0 g/dL   Patient temperature 95.7 F    Collection site Brachial    Drawn by RT    Sample type ARTERIAL    Comment NOTIFIED PHYSICIAN    DG Chest Portable 1 View  Result Date: 05/31/2021 CLINICAL DATA:  Status post CPR EXAM: PORTABLE CHEST 1 VIEW COMPARISON:  And first 2020 FINDINGS: The cardiomediastinal silhouette is unchanged and enlarged in contour. Status post median sternotomy and CABG. LEFT chest cardiac pacing device. ETT tip terminates 5 cm above the carina. No pleural effusion. No pneumothorax. No acute pleuroparenchymal abnormality. Visualized abdomen is unremarkable. Multilevel degenerative changes of the thoracic spine. IMPRESSION:  Support apparatus as described above. Electronically Signed   By:  Valentino Saxon MD   On: 05/31/2021 16:45     EKG EKG Interpretation  Date/Time:  Saturday May 31 2021 15:56:37 EDT Ventricular Rate:  192 PR Interval:    QRS Duration: 173 QT Interval:  300 QTC Calculation: 532 R Axis:   118 Text Interpretation: Wide QRS tachycardia Confirmed by Lajean Saver [phone]) on 05/31/2021 4:47:01 PM  Radiology DG Chest Portable 1 View  Result Date: 05/31/2021 CLINICAL DATA:  Status post CPR EXAM: PORTABLE CHEST 1 VIEW COMPARISON:  And first 2020 FINDINGS: The cardiomediastinal silhouette is unchanged and enlarged in contour. Status post median sternotomy and CABG. LEFT chest cardiac pacing device. ETT tip terminates 5 cm above the carina. No pleural effusion. No pneumothorax. No acute pleuroparenchymal abnormality. Visualized abdomen is unremarkable. Multilevel degenerative changes of the thoracic spine. IMPRESSION: Support apparatus as described above. Electronically Signed   By: Valentino Saxon MD   On: 05/31/2021 16:45    Procedures Procedures   Medications Ordered in ED Medications  calcium chloride 10 % injection (has no administration in time range)  norepinephrine (LEVOPHED) 4mg  in 244mL premix infusion (30 mcg/min Intravenous New Bag/Given 05/31/21 1734)  norepinephrine (LEVOPHED) 4mg  in 261mL premix infusion (has no administration in time range)  fentaNYL 2594mcg in NS 251mL (5mcg/ml) infusion-PREMIX (has no administration in time range)  fentaNYL (SUBLIMAZE) bolus via infusion 25-100 mcg (100 mcg Intravenous Bolus from Bag 05/31/21 1700)  fentaNYL (SUBLIMAZE) injection 50 mcg (has no administration in time range)  fentaNYL 2554mcg in NS 233mL (88mcg/ml) infusion-PREMIX (has no administration in time range)  fentaNYL (SUBLIMAZE) bolus via infusion 25 mcg (has no administration in time range)  midazolam (VERSED) injection 1 mg (has no administration in time range)  midazolam  (VERSED) injection 1 mg (has no administration in time range)  sodium chloride 0.9 % bolus 1,000 mL (has no administration in time range)  sodium bicarbonate 150 mEq in dextrose 5 % 1,150 mL infusion (has no administration in time range)  sodium bicarbonate injection 50 mEq (has no administration in time range)  lidocaine (cardiac) 2000 mg in dextrose 5% 500 mL (4mg /mL) IV infusion (4 mg/min Intravenous New Bag/Given 05/31/21 1549)  calcium chloride injection (1 g Intravenous Given 05/31/21 1616)  EPINEPHrine (ADRENALIN) 1 MG/10ML injection (1 mg Intravenous Given 05/31/21 1616)  propofol (DIPRIVAN) 1000 MG/100ML infusion (100 mcg Intravenous New Bag/Given 05/31/21 1601)  magnesium sulfate (IV Push/IM) injection (2 g Intravenous Given 05/31/21 1605)  amiodarone (CORDARONE) 360 mg in dextrose 5 % 100 mL bolus (360 mg Intravenous New Bag/Given 05/31/21 1606)  amiodarone (CORDARONE) injection (300 mg Intravenous Given 05/31/21 1613)  sodium bicarbonate injection (100 mEq Intravenous Given 05/31/21 1619)  dextrose 50 % solution (25 g Intravenous Given 05/31/21 1620)  amiodarone (NEXTERONE PREMIX) 360-4.14 MG/200ML-% (1.8 mg/mL) IV infusion (4 mg/hr  New Bag/Given 05/31/21 1648)  0.9 %  sodium chloride infusion (1,000 mLs Intravenous New Bag/Given 05/31/21 1627)  norepinephrine (LEVOPHED) 4-5 MG/250ML-% infusion SOLN (30 mcg/min  Rate/Dose Change 05/31/21 1725)  0.9 %  sodium chloride infusion ( Intravenous New Bag/Given 05/31/21 1731)    ED Course  I have reviewed the triage vital signs and the nursing notes.  Pertinent labs & imaging results that were available during my care of the patient were reviewed by me and considered in my medical decision making (see chart for details).    MDM Rules/Calculators/A&P  Iv x 2. Ns bolus. CPR continued. Resp therapy consulted - placed on vent. Lucas device delivering chest compressions. Pulses with Linton Rump otherwise pulseless.   Stat Istat/lab.  Cardiology emergently consulted.   On arrival remains in vfib.  Defib. EMS notes rhythm appeared c/w torsades. 2nd Mg given 2 gm iv. Amio gtt. Lidocaine bolus. EPI. Ca. Hc03 iv. Defib. Propofol for sedation. Dose fentanyl for pain.   WIth each defib, transiently ~10 seconds) in narrower complex rhythm w rate in 40s and 50s, but no pulses.  CPR/Lucas continued. Recheck bil bs. Good etco2 waveform. Pulse ox 100%.  Cycle of meds, defib, transient PEA rhythm with rapid deterioration back to vfib. Cardiology indicates could try additional amio 300 mg iv - given.   Initial istat8/labs reviewed/interpreted by me - k high. Additonal ca and hco3, d50 given. Amio gtt continues. Additional cpr.   Cardiology indicates no plans for emergent cath labs, recommends consider termination efforts. After additional defib, pt with paced rhythm, rate 60s, with pulses.  Cardiology indicates admit to PCCM.   Discussed w family - states for now, wants full scope of treatment.   PCXR reviewed/interpreted by me - ETT ok. No pna.   PCCM consulted. Discussed pt, will see in ED - further care/management per PCCM team.   Additional labs reviewed/interpreted by me - repeat chem improved from initial, k normal. From abg hco3 low. Hco3 iv and gtt ordered. Additional ns bolus.   Given arrest, ?fall, altered ms, will get ct head/neck as soon as stable/able.   CRITICAL CARE RE: cardiac arrest/refractory afib, ICM, hypotension. Performed by: Mirna Mires Total critical care time: 140 minutes Critical care time was exclusive of separately billable procedures and treating other patients. Critical care was necessary to treat or prevent imminent or life-threatening deterioration. Critical care was time spent personally by me on the following activities: development of treatment plan with patient and/or surrogate as well as nursing, discussions with consultants, evaluation of patient's response to treatment, examination of patient,  obtaining history from patient or surrogate, ordering and performing treatments and interventions, ordering and review of laboratory studies, ordering and review of radiographic studies, pulse oximetry and re-evaluation of patient's condition.    Final Clinical Impression(s) / ED Diagnoses Final diagnoses:  None    Rx / DC Orders ED Discharge Orders     None        Lajean Saver, MD 05/31/21 1827

## 2021-05-31 NOTE — ED Triage Notes (Signed)
Pt arrive by [facility] from home CPR in process, pt found unresponsive with no pulses and VF on monitor by EMS, cpr started total of 9 epi 300 mg Amion, 150 amion, and 2g Mg given by EMS prior to arrival to ED.

## 2021-05-31 NOTE — Progress Notes (Signed)
Critical ABG values give to Dr. Ashok Cordia.

## 2021-06-01 ENCOUNTER — Inpatient Hospital Stay (HOSPITAL_COMMUNITY): Payer: Medicare Other

## 2021-06-01 ENCOUNTER — Inpatient Hospital Stay: Payer: Self-pay

## 2021-06-01 DIAGNOSIS — I462 Cardiac arrest due to underlying cardiac condition: Secondary | ICD-10-CM

## 2021-06-01 DIAGNOSIS — J9601 Acute respiratory failure with hypoxia: Secondary | ICD-10-CM | POA: Diagnosis not present

## 2021-06-01 DIAGNOSIS — J690 Pneumonitis due to inhalation of food and vomit: Secondary | ICD-10-CM | POA: Diagnosis not present

## 2021-06-01 DIAGNOSIS — I469 Cardiac arrest, cause unspecified: Secondary | ICD-10-CM | POA: Diagnosis not present

## 2021-06-01 DIAGNOSIS — I4819 Other persistent atrial fibrillation: Secondary | ICD-10-CM | POA: Diagnosis not present

## 2021-06-01 DIAGNOSIS — I441 Atrioventricular block, second degree: Secondary | ICD-10-CM | POA: Diagnosis not present

## 2021-06-01 DIAGNOSIS — I4901 Ventricular fibrillation: Principal | ICD-10-CM

## 2021-06-01 DIAGNOSIS — Z20822 Contact with and (suspected) exposure to covid-19: Secondary | ICD-10-CM | POA: Diagnosis not present

## 2021-06-01 LAB — POCT I-STAT 7, (LYTES, BLD GAS, ICA,H+H)
Acid-base deficit: 7 mmol/L — ABNORMAL HIGH (ref 0.0–2.0)
Bicarbonate: 16.7 mmol/L — ABNORMAL LOW (ref 20.0–28.0)
Calcium, Ion: 1.49 mmol/L — ABNORMAL HIGH (ref 1.15–1.40)
HCT: 45 % (ref 39.0–52.0)
Hemoglobin: 15.3 g/dL (ref 13.0–17.0)
O2 Saturation: 97 %
Patient temperature: 37.5
Potassium: 3.9 mmol/L (ref 3.5–5.1)
Sodium: 140 mmol/L (ref 135–145)
TCO2: 18 mmol/L — ABNORMAL LOW (ref 22–32)
pCO2 arterial: 29.9 mmHg — ABNORMAL LOW (ref 32.0–48.0)
pH, Arterial: 7.356 (ref 7.350–7.450)
pO2, Arterial: 95 mmHg (ref 83.0–108.0)

## 2021-06-01 LAB — ECHOCARDIOGRAM COMPLETE
AR max vel: 3.05 cm2
AV Area VTI: 2.95 cm2
AV Area mean vel: 2.65 cm2
AV Mean grad: 3 mmHg
AV Peak grad: 4 mmHg
Ao pk vel: 1 m/s
Area-P 1/2: 3.72 cm2
Height: 73.5 in
MV VTI: 1.99 cm2
S' Lateral: 4.4 cm
Weight: 3862.46 oz

## 2021-06-01 LAB — BASIC METABOLIC PANEL
Anion gap: 8 (ref 5–15)
BUN: 34 mg/dL — ABNORMAL HIGH (ref 8–23)
CO2: 18 mmol/L — ABNORMAL LOW (ref 22–32)
Calcium: 9.9 mg/dL (ref 8.9–10.3)
Chloride: 110 mmol/L (ref 98–111)
Creatinine, Ser: 2.67 mg/dL — ABNORMAL HIGH (ref 0.61–1.24)
GFR, Estimated: 26 mL/min — ABNORMAL LOW (ref >60)
Glucose, Bld: 173 mg/dL — ABNORMAL HIGH (ref 70–99)
Potassium: 3.6 mmol/L (ref 3.5–5.1)
Sodium: 136 mmol/L (ref 135–145)

## 2021-06-01 LAB — GLUCOSE, CAPILLARY
Glucose-Capillary: 144 mg/dL — ABNORMAL HIGH (ref 70–99)
Glucose-Capillary: 155 mg/dL — ABNORMAL HIGH (ref 70–99)
Glucose-Capillary: 99 mg/dL (ref 70–99)

## 2021-06-01 LAB — PHOSPHORUS: Phosphorus: 2.8 mg/dL (ref 2.5–4.6)

## 2021-06-01 LAB — CBC
HCT: 44.9 % (ref 39.0–52.0)
Hemoglobin: 15 g/dL (ref 13.0–17.0)
MCH: 31.2 pg (ref 26.0–34.0)
MCHC: 33.4 g/dL (ref 30.0–36.0)
MCV: 93.3 fL (ref 80.0–100.0)
Platelets: 190 10*3/uL (ref 150–400)
RBC: 4.81 MIL/uL (ref 4.22–5.81)
RDW: 13.1 % (ref 11.5–15.5)
WBC: 24.8 10*3/uL — ABNORMAL HIGH (ref 4.0–10.5)
nRBC: 0.1 % (ref 0.0–0.2)

## 2021-06-01 LAB — C DIFFICILE QUICK SCREEN W PCR REFLEX
C Diff antigen: NEGATIVE
C Diff interpretation: NOT DETECTED
C Diff toxin: NEGATIVE

## 2021-06-01 LAB — MAGNESIUM: Magnesium: 3.6 mg/dL — ABNORMAL HIGH (ref 1.7–2.4)

## 2021-06-01 MED ORDER — PERFLUTREN LIPID MICROSPHERE
INTRAVENOUS | Status: AC
  Filled 2021-06-01: qty 10

## 2021-06-01 MED ORDER — ATROPINE SULFATE 1 MG/10ML IJ SOSY
PREFILLED_SYRINGE | INTRAMUSCULAR | Status: AC
  Filled 2021-06-01: qty 10

## 2021-06-01 MED ORDER — HEPARIN (PORCINE) 25000 UT/250ML-% IV SOLN
1200.0000 [IU]/h | INTRAVENOUS | Status: DC
  Administered 2021-06-01: 1250 [IU]/h via INTRAVENOUS
  Administered 2021-06-02: 1050 [IU]/h via INTRAVENOUS
  Administered 2021-06-03: 1100 [IU]/h via INTRAVENOUS
  Administered 2021-06-04 – 2021-06-05 (×2): 1200 [IU]/h via INTRAVENOUS
  Filled 2021-06-01 (×4): qty 250

## 2021-06-01 MED ORDER — ALPRAZOLAM 0.25 MG PO TABS
0.2500 mg | ORAL_TABLET | Freq: Three times a day (TID) | ORAL | Status: DC | PRN
  Administered 2021-06-01 – 2021-06-11 (×12): 0.25 mg via ORAL
  Filled 2021-06-01 (×12): qty 1

## 2021-06-01 MED ORDER — SODIUM CHLORIDE 0.9% FLUSH
10.0000 mL | Freq: Two times a day (BID) | INTRAVENOUS | Status: DC
  Administered 2021-06-01 (×2): 10 mL
  Administered 2021-06-02: 30 mL
  Administered 2021-06-02 – 2021-06-07 (×4): 10 mL
  Administered 2021-06-08: 30 mL
  Administered 2021-06-10 – 2021-06-12 (×4): 10 mL

## 2021-06-01 MED ORDER — IPRATROPIUM-ALBUTEROL 0.5-2.5 (3) MG/3ML IN SOLN
3.0000 mL | Freq: Four times a day (QID) | RESPIRATORY_TRACT | Status: DC | PRN
  Administered 2021-06-01: 3 mL via RESPIRATORY_TRACT
  Filled 2021-06-01: qty 3

## 2021-06-01 MED ORDER — ORAL CARE MOUTH RINSE
15.0000 mL | OROMUCOSAL | Status: DC
  Administered 2021-06-01 – 2021-06-02 (×10): 15 mL via OROMUCOSAL

## 2021-06-01 MED ORDER — CHLORHEXIDINE GLUCONATE CLOTH 2 % EX PADS
6.0000 | MEDICATED_PAD | Freq: Every day | CUTANEOUS | Status: DC
  Administered 2021-06-01 – 2021-06-05 (×5): 6 via TOPICAL

## 2021-06-01 MED ORDER — OXYCODONE-ACETAMINOPHEN 5-325 MG PO TABS
1.0000 | ORAL_TABLET | ORAL | Status: DC | PRN
  Administered 2021-06-01: 2 via ORAL
  Administered 2021-06-01: 1 via ORAL
  Administered 2021-06-02 (×3): 2 via ORAL
  Administered 2021-06-03 (×2): 1 via ORAL
  Administered 2021-06-04: 2 via ORAL
  Administered 2021-06-04 – 2021-06-05 (×4): 1 via ORAL
  Administered 2021-06-06: 2 via ORAL
  Administered 2021-06-06 – 2021-06-07 (×2): 1 via ORAL
  Administered 2021-06-07 – 2021-06-09 (×5): 2 via ORAL
  Administered 2021-06-09 (×2): 1 via ORAL
  Administered 2021-06-10 – 2021-06-11 (×2): 2 via ORAL
  Administered 2021-06-11: 1 via ORAL
  Administered 2021-06-12 (×2): 2 via ORAL
  Filled 2021-06-01: qty 2
  Filled 2021-06-01: qty 1
  Filled 2021-06-01 (×3): qty 2
  Filled 2021-06-01: qty 1
  Filled 2021-06-01 (×3): qty 2
  Filled 2021-06-01: qty 1
  Filled 2021-06-01 (×4): qty 2
  Filled 2021-06-01 (×2): qty 1
  Filled 2021-06-01 (×2): qty 2
  Filled 2021-06-01: qty 1
  Filled 2021-06-01 (×3): qty 2
  Filled 2021-06-01 (×2): qty 1
  Filled 2021-06-01: qty 2
  Filled 2021-06-01 (×2): qty 1

## 2021-06-01 MED ORDER — PERFLUTREN LIPID MICROSPHERE
1.0000 mL | INTRAVENOUS | Status: DC | PRN
  Administered 2021-06-01: 3 mL via INTRAVENOUS
  Filled 2021-06-01: qty 10

## 2021-06-01 MED ORDER — POTASSIUM CHLORIDE CRYS ER 20 MEQ PO TBCR
40.0000 meq | EXTENDED_RELEASE_TABLET | Freq: Once | ORAL | Status: AC
  Administered 2021-06-01: 40 meq via ORAL
  Filled 2021-06-01: qty 2

## 2021-06-01 MED ORDER — SODIUM CHLORIDE 0.9% FLUSH
10.0000 mL | INTRAVENOUS | Status: DC | PRN
  Administered 2021-06-03: 10 mL

## 2021-06-01 MED ORDER — CHLORHEXIDINE GLUCONATE 0.12% ORAL RINSE (MEDLINE KIT)
15.0000 mL | Freq: Two times a day (BID) | OROMUCOSAL | Status: DC
  Administered 2021-06-01 (×2): 15 mL via OROMUCOSAL

## 2021-06-01 NOTE — Progress Notes (Signed)
[facility] Progress Note Patient Name: GARRON ELINE DOB: 04/26/1956 MRN: [id number]   Date of Service  06/01/2021  HPI/Events of Note  RN requesting DuoNebs for wheezing.  eICU Interventions  DuoNebs ordered Q6H PRN.     Intervention Category Minor Interventions: Routine modifications to care plan (e.g. PRN medications for pain, fever)  Marily Lente Mardy Lucier 06/01/2021, 10:38 PM

## 2021-06-01 NOTE — Progress Notes (Signed)
Patient still OTF - will attempt EEG when patient is in room/ as EEG schedule permits

## 2021-06-01 NOTE — Progress Notes (Signed)
NAME:  Ross [name], MRN:  [id number], DOB:  August 21, 1956, LOS: 1 ADMISSION DATE:  05/31/2021, CONSULTATION DATE: 05/31/2021 REFERRING MD: Dr. Delice Lesch on, CHIEF COMPLAINT: Cardiac arrest  History of Present Illness:  This is a 65 year old gentleman admitted on 05/31/2021, past medical history of coronary artery disease, ischemic cardiomyopathy, CABG.  Patient was followed by Dr. Daneen Schick cardiology office.  History of coronary artery disease status post CABG, last cath in March 2018 with an occlusive SVG to PDA and high-grade stenosis of mid RCA not suitable for PCI.  Patient has primary hypertension, chronic stable angina time, type 2 diabetes, chronic systolic and diastolic heart failure, morbid obesity, dilated aortic root chronic atrial atrial for fibrillation, dual-chamber pacemaker placed in 2017.  Most recent echo demonstrated LV with a EF of 25% March 2022.  Last seen in the office on 05/29/2021, Dr. Tamala Julian office note reviewed.  Present was present at home had sudden collapse witnessed by his wife was found unresponsive CPR was started and he was defibrillated x10, received 450 mg of IV amiodarone.  He remained pulseless was placed on the [place] device.  He was shocked 10+ times with greater than an hour of resuscitative time.  Patient following this was awake alert following commands and taken to the Cath Lab by cardiology.  Pulmonary critical care was consulted for management of pressors and vent.  Pertinent  Medical History   Past Medical History:  Diagnosis Date   Anxiety    Arthritis    "all over my body"   Atrial fibrillation ([place])    rate control strategy; LA large   Carpal tunnel syndrome    Cerebral aneurysm dx'd 12/23/7251   Complication of anesthesia    "I' was told that I'm a very high risk to be put to sleep; I may not come out of it" (01/16/2016)   COPD (chronic obstructive pulmonary disease) ([place])    Coronary artery disease    a. s/p CABG;  b. Myoview (7/13):  apical  anterior ischemia; c. LHC (08/2012): dLM 60%, mid LAD 90%, pCFX 40%, OM1 occluded, RCA 80-90%, distal RCA 60-70%, SVG-Dx patent, SVG-OM1 patent, LIMA-LAD patent, SVG-PDA occluded, EF 55%. => RCA dsz too long and would req multiple stents; pt refused CABG => med Rx.   Depression    DJD (degenerative joint disease)    Dyslipidemia    Hx of echocardiogram    a. Echocardiogram (05/2013): Mild LVH, EF 54.8%, moderate LAE, mild aortic root dilatation, trace pulmonic regurgitation   Hypertension    Obesity    Peripheral neuropathy    Presence of permanent cardiac pacemaker    Type II diabetes mellitus ([place])    "haven't taken RX for 7-8 years" (01/16/2016)     Significant Hospital Events: Including procedures, antibiotic start and stop dates in addition to other pertinent events   05/31/2021: IC admission, Cath Lab  Interim History / Subjective:  Per HPI above.  Critically ill intubated on mechanical life support.  However awake this morning following commands  Objective   Blood pressure 120/61, pulse 61, temperature 99.9 F (37.7 C), resp. rate 12, height 6' 1.5" (1.867 m), weight 109.5 kg, SpO2 95 %.    Vent Mode: PRVC FiO2 (%):  [36 %-60 %] 36 % Set Rate:  [16 bmp-24 bmp] 24 bmp Vt Set:  [580 mL-640 mL] 580 mL PEEP:  [5 cmH20] 5 cmH20 Plateau Pressure:  [16 cmH20-19 cmH20] 17 cmH20   Intake/Output Summary (Last 24 hours) at 06/01/2021 0913  Last data filed at 06/01/2021 0900 Gross per 24 hour  Intake 1519.02 ml  Output 90 ml  Net 1429.02 ml   Filed Weights   05/31/21 2200  Weight: 109.5 kg    Examination: General: Obese male intubated on life support HENT: Tracking appropriately, NCAT Lungs: Bilateral mechanically ventilated breath sounds Cardiovascular: Regular rate rhythm, S1-S2 Abdomen: Soft nontender nondistended Extremities: No significant edema Neuro: Alert oriented following commands GU: Deferred  Labs/imaging that I havepersonally reviewed  (right click and  "Reselect all SmartList Selections" daily)  Sodium 136 BUN 34 Serum creatinine 2.67 White blood cell count 24.8  Resolved Hospital Problem list     Assessment & Plan:   Prolonged VT/VF cardiac arrest, out-of-hospital Shock, cardiogenic postarrest Greater than 2 hours in an hour of VT/VF, shock 10+ times.  Multiple rounds of CPR. Metabolic acidosis, lactic acidosis postarrest Plan: Wean from pressors Continue amiodarone Possible AICD upgrade per EP prior to discharge   Acute hypoxemic respiratory failure requiring intubation mechanical ventilation secondary above Plan: Sedation held SBT SAT Plans for liberation from ventilator  Elevated serum creatinine AKI Plan: Follow urine output and I's and O's No indication for dialysis at this time  Leukocytosis Likely aspiration event for greater than 1 hour of CPR. Plan: Suspect postarrest, stress response Was down for a long time Would treat 7 days with Unasyn for aspiration.   Best practice (right click and "Reselect all SmartList Selections" daily)  Diet:  NPO Pain/Anxiety/Delirium protocol (if indicated): No VAP protocol (if indicated): Yes DVT prophylaxis: Systemic AC GI prophylaxis: H2B Glucose control:  SSI Yes Central venous access:  Yes, and it is still needed Arterial line:  N/A Foley:  Yes, and it is still needed Mobility:  OOB  PT consulted: Yes Last date of multidisciplinary goals of care discussion [n/a] Code Status:  full code Disposition: ICU   Labs   CBC: Recent Labs  Lab 06/01/21 0124 06/01/21 0303  WBC  --  24.8*  HGB 15.3 15.0  HCT 45.0 44.9  MCV  --  93.3  PLT  --  527    Basic Metabolic Panel: Recent Labs  Lab 06/01/21 0124 06/01/21 0303  NA 140 136  K 3.9 3.6  CL  --  110  CO2  --  18*  GLUCOSE  --  173*  BUN  --  34*  CREATININE  --  2.67*  CALCIUM  --  9.9  MG  --  3.6*  PHOS  --  2.8   GFR: Estimated Creatinine Clearance: 35.6 mL/min (A) (by C-G formula based on SCr  of 2.67 mg/dL (H)). Recent Labs  Lab 06/01/21 0303  WBC 24.8*    Liver Function Tests: No results for input(s): AST, ALT, ALKPHOS, BILITOT, PROT, ALBUMIN in the last 168 hours. No results for input(s): LIPASE, AMYLASE in the last 168 hours. No results for input(s): AMMONIA in the last 168 hours.  ABG    Component Value Date/Time   PHART 7.356 06/01/2021 0124   PCO2ART 29.9 (L) 06/01/2021 0124   PO2ART 95 06/01/2021 0124   HCO3 16.7 (L) 06/01/2021 0124   TCO2 18 (L) 06/01/2021 0124   ACIDBASEDEF 7.0 (H) 06/01/2021 0124   O2SAT 97.0 06/01/2021 0124     Coagulation Profile: No results for input(s): INR, PROTIME in the last 168 hours.  Cardiac Enzymes: No results for input(s): CKTOTAL, CKMB, CKMBINDEX, TROPONINI in the last 168 hours.  HbA1C: No results found for: HGBA1C  CBG: Recent Labs  Lab 06/01/21  0006 06/01/21 0354  GLUCAP 155* 144*    Critical care time:     This patient is critically ill with multiple organ system failure; which, requires frequent high complexity decision making, assessment, support, evaluation, and titration of therapies. This was completed through the application of advanced monitoring technologies and extensive interpretation of multiple databases. During this encounter critical care time was devoted to patient care services described in this note for 32 minutes.  Garner Nash, DO [place] [facility] 06/01/2021 9:22 AM

## 2021-06-01 NOTE — Progress Notes (Signed)
Peripherally Inserted Central Catheter Placement  The IV Nurse has discussed with the patient and/or persons authorized to consent for the patient, the purpose of this procedure and the potential benefits and risks involved with this procedure.  The benefits include less needle sticks, lab draws from the catheter, and the patient may be discharged home with the catheter. Risks include, but not limited to, infection, bleeding, blood clot (thrombus formation), and puncture of an artery; nerve damage and irregular heartbeat and possibility to perform a PICC exchange if needed/ordered by physician.  Alternatives to this procedure were also discussed.  Bard Power PICC patient education guide, fact sheet on infection prevention and patient information card has been provided to patient /or left at bedside.  Telephone consent obtained from daughter, witnessed with Lannette Donath, Therapist, sports.  PICC Placement Documentation  PICC Triple Lumen 61/95/09 PICC Right Basilic 42 cm 0 cm (Active)  Indication for Insertion or Continuance of Line Vasoactive infusions 06/01/21 0705  Exposed Catheter (cm) 0 cm 06/01/21 0705  Site Assessment Clean;Dry;Intact 06/01/21 0705  Lumen #1 Status Capped (Central line);Flushed;Blood return noted 06/01/21 0705  Lumen #2 Status Capped (Central line);Flushed;Blood return noted 06/01/21 0705  Lumen #3 Status Capped (Central line);Flushed;Blood return noted 06/01/21 0705  Dressing Type Transparent;Securing device 06/01/21 0705  Dressing Status Clean;Dry;Intact 06/01/21 0705  Antimicrobial disc in place? Yes 06/01/21 0705  Safety Lock Not Applicable 32/67/12 4580  Line Care Connections checked and tightened 06/01/21 0705  Line Adjustment (NICU/IV Team Only) No 06/01/21 0705  Dressing Intervention New dressing 06/01/21 0705  Dressing Change Due 06/01/21 06/01/21 0705       Rosalio Macadamia Chenice 06/01/2021, 7:20 AM

## 2021-06-01 NOTE — Progress Notes (Signed)
Patient is off the floor at this time- unable to obtain EEG. Will try again as schedule permits

## 2021-06-01 NOTE — Progress Notes (Signed)
PIV consult: RN reports phlebitis in L forearm. Tightness and discoloration apparent. L upper arm with edema from previous infiltration.  Requested order for PICC insertion. This RN unable to place solo due to pt's movement and frequent need for redirection. Attempted USGPIV above infiltration site: unsuccessful.   2 PIV in R forearm area appear patent. RN reports need to start bicarb as well.

## 2021-06-01 NOTE — Progress Notes (Signed)
[facility] Progress Note Patient Name: Ross [name] DOB: 1956-07-11 MRN: [id number]   Date of Service  06/01/2021  HPI/Events of Note  ABG 7.36/29/95/18/BE -7.  Remains on NaHCO3 at 150 mL/hr.  Vent Mode: PRVC FiO2 (%):  [40 %-60 %] 40 % Set Rate:  [16 bmp-24 bmp] 24 bmp Vt Set:  [640 mL] 640 mL PEEP:  [5 cmH20] 5 cmH20 Plateau Pressure:  [16 cmH20-19 cmH20] 19 cmH20    eICU Interventions  Reduce VT to 580 mL (slightly over 42mL/kg IBW).      Intervention Category Major Interventions: Acid-Base disturbance - evaluation and management;Respiratory failure - evaluation and management  Charlott Rakes 06/01/2021, 1:42 AM

## 2021-06-01 NOTE — Progress Notes (Signed)
ANTICOAGULATION CONSULT NOTE - Follow Up Consult  Pharmacy Consult for IV heparin  Indication: atrial fibrillation  No Known Allergies  Patient Measurements: Height: 6' 1.5" (186.7 cm) Weight: 109.5 kg (241 lb 6.5 oz) IBW/kg (Calculated) : 81.05 Heparin Dosing Weight: 103 kg  Vital Signs: Temp: 99.9 F (37.7 C) (06/12 0900) Temp Source: Bladder (06/12 0800) BP: 120/61 (06/12 0900) Pulse Rate: 61 (06/12 0900)  Labs: Recent Labs    06/01/21 0124 06/01/21 0303  HGB 15.3 15.0  HCT 45.0 44.9  PLT  --  190  CREATININE  --  2.67*    Estimated Creatinine Clearance: 35.6 mL/min (A) (by C-G formula based on SCr of 2.67 mg/dL (H)).   Medications:  Infusions:   sodium chloride     sodium chloride     amiodarone 30 mg/hr (06/01/21 0900)   ampicillin-sulbactam (UNASYN) IV Stopped (06/01/21 0049)   famotidine (PEPCID) IV 100 mL/hr at 06/01/21 0900   heparin     norepinephrine (LEVOPHED) Adult infusion Stopped (06/01/21 7342)    Assessment: 65 YO M on apixaban PTA for Afib, last dose unknown at this time given patient arrived in ED undergoing CPR for Vfib for approximately 2 hours. 6/11 Cath showed stable coronaries compared to 2018. Pharmacy consulted to start IV heparin while holding apixaban for procedures.    H/H stable, plt wnl. Sheath kept after cath for access and BP monitoring. Heparin to start 8 hours after sheath pull - RN to remove now that pt has PICC.  Goal of Therapy:  Heparin level 0.3-0.7 units/ml Monitor platelets by anticoagulation protocol: Yes   Plan:  Start IV heparin at 1250 units/hr at 7 pm tonight. Check heparin level and aPTT 8 hrs after gtt starts. Daily heparin level, aPTT and CBC. Per Dr. Rayann Heman - keep on heparin until device upgrade (then back to [place]).  [place] Crane, Roylene Reason, [facility] Clinical Pharmacist  06/01/2021 10:29 AM   [facility] pharmacy phone numbers are listed on [email or web address]

## 2021-06-01 NOTE — Progress Notes (Signed)
Progress Note   Subjective   Remains intubated, on pressors.  Hoping to extubate today.  Inpatient Medications    Scheduled Meds:  aspirin  81 mg Oral Daily   atorvastatin  80 mg Oral Daily   chlorhexidine gluconate (MEDLINE KIT)  15 mL Mouth Rinse BID   Chlorhexidine Gluconate Cloth  6 each Topical Daily   fentaNYL (SUBLIMAZE) injection  25 mcg Intravenous Once   insulin aspart  0-9 Units Subcutaneous Q4H   mouth rinse  15 mL Mouth Rinse 10 times per day   sodium chloride flush  10-40 mL Intracatheter Q12H   sodium chloride flush  3 mL Intravenous Q12H   Continuous Infusions:  sodium chloride     sodium chloride     amiodarone     amiodarone 30 mg/hr (06/01/21 0600)   ampicillin-sulbactam (UNASYN) IV Stopped (06/01/21 0049)   famotidine (PEPCID) IV Stopped (06/01/21 0106)   fentaNYL infusion INTRAVENOUS Stopped (06/01/21 0555)   norepinephrine (LEVOPHED) Adult infusion 10 mcg/min (06/01/21 0600)   PRN Meds: sodium chloride, acetaminophen, fentaNYL, ondansetron (ZOFRAN) IV, sodium chloride flush, sodium chloride flush   Vital Signs    Vitals:   06/01/21 0645 06/01/21 0700 06/01/21 0715 06/01/21 0747  BP: 114/67 113/64 101/61   Pulse: 61 60 61   Resp: (!) 24 (!) 24 (!) 24 (!) 24  Temp: 100 F (37.8 C) 100 F (37.8 C) 100 F (37.8 C)   TempSrc:      SpO2: 100% 100% 100% 100%  Weight:      Height:        Intake/Output Summary (Last 24 hours) at 06/01/2021 0817 Last data filed at 06/01/2021 0600 Gross per 24 hour  Intake 1386.94 ml  Output --  Net 1386.94 ml   Filed Weights   05/31/21 2200  Weight: 109.5 kg    Telemetry    Sinus, demand V pacing with occasional AV dissociation, no VT - Personally Reviewed  Physical Exam   GEN- The patient is ill appearing,  alert Head- normocephalic, atraumatic Eyes-  Sclera clear, conjunctiva pink Ears- hearing intact Oropharynx- ETT Neck- supple, Lungs-  normal work of breathing Heart- Regular rate and rhythm   GI- soft  Extremities- no clubbing, cyanosis, or edema  MS- diffuse atrophy Skin- no rash or lesion Neuro- following commands, alert   Labs    Chemistry Recent Labs  Lab 06/01/21 0124 06/01/21 0303  NA 140 136  K 3.9 3.6  CL  --  110  CO2  --  18*  GLUCOSE  --  173*  BUN  --  34*  CREATININE  --  2.67*  CALCIUM  --  9.9  GFRNONAA  --  26*  ANIONGAP  --  8     Hematology Recent Labs  Lab 06/01/21 0124 06/01/21 0303  WBC  --  24.8*  RBC  --  4.81  HGB 15.3 15.0  HCT 45.0 44.9  MCV  --  93.3  MCH  --  31.2  MCHC  --  33.4  RDW  --  13.1  PLT  --  190     Patient ID  Ross [name] is a 65 y.o. male with known ischemic CM (EF 25%), prior CABG, permanent afib, and DM who is admitted with VF arrest.  Assessment & Plan    1.  S/p VF arrest He has made remarkable early recovery.  Continue to support him as he recovers.  PCCM managing vent/ pressors/ sedation. Continue IV  amiodarone. Anticipate ICD upgrade prior to discharge  2. Ischemic CM/ CAD s/p CABG/ acute on chronic systolic dysfunction Cath reviewed.  He has extensive disease.  Nothing amenable to revascularization.  Echo pending. Will consider upgrade to CRT-D depending on how he improves (likely Wednesday)  3. Afib He is now in sinus, though previously "permanent" afib Eliquis is on hold Would advise IV heparin for now.  Resume eliquis post ICD upgrade later this week.  4. Second degree AV block Pacemaker in place Will ask [employer] to interrogate Monday AM Consider programming VVI 50 bpm as he has only a ventricular lead and is intermittently AV dissociated.  At times, he appears to have intact AV conduction. Consider CRT at time of device upgrade.  Would also plan atrial lead.  Critical care time was exclusive of separate billable procedures and treating other patients.  Critial care time was spent personally by me (independant of midlevel providers) on the following activities: development of  treatment plan with nursing, discussions with consultants, evaluation of patients response to treatment, examining patient,  ordering/ reviewing treatments/ interventions, lab studies, radiographic studies, pulse ox, and re-evaluation of patients condition.     The patient is critically ill with multiple organ systems failure and requires high complexity decision making for assessment and support, frequent evaluation and titration of therapies, application of advanced monitoring technologies and extensive interpretation of databases.   Critical care was necessary to treat or prevent immintent or life-threatening deterioration.    Total CCT spent directly with the patient today is 45 minutes  Thompson Grayer MD, [facility] 06/01/2021 8:26 AM

## 2021-06-01 NOTE — Plan of Care (Signed)

## 2021-06-01 NOTE — Progress Notes (Signed)
[place] Progress Note Patient Name: Ross [name] DOB: Feb 28, 1956 MRN: [id number]   Date of Service  06/01/2021  HPI/Events of Note  RN requests flexiseal for multiple loose stools. Also notifies me of no UOP (which is not unexpected unfortunately in setting of his prolonged resuscitation and shock state).   eICU Interventions  Flexiseal ordered.     Intervention Category Intermediate Interventions: Other:  Charlott Rakes 06/01/2021, 2:59 AM

## 2021-06-01 NOTE — Progress Notes (Addendum)
  Echocardiogram 2D Echocardiogram has been performed.  Ross [name] 06/01/2021, 8:58 AM

## 2021-06-02 ENCOUNTER — Encounter (HOSPITAL_COMMUNITY): Payer: Self-pay | Admitting: Cardiovascular Disease

## 2021-06-02 ENCOUNTER — Other Ambulatory Visit: Payer: Self-pay

## 2021-06-02 ENCOUNTER — Other Ambulatory Visit (HOSPITAL_COMMUNITY): Payer: Medicare Other

## 2021-06-02 DIAGNOSIS — N170 Acute kidney failure with tubular necrosis: Secondary | ICD-10-CM

## 2021-06-02 DIAGNOSIS — I255 Ischemic cardiomyopathy: Secondary | ICD-10-CM | POA: Diagnosis not present

## 2021-06-02 DIAGNOSIS — I469 Cardiac arrest, cause unspecified: Secondary | ICD-10-CM

## 2021-06-02 LAB — CBC
HCT: 35.8 % — ABNORMAL LOW (ref 39.0–52.0)
HCT: 38.5 % — ABNORMAL LOW (ref 39.0–52.0)
Hemoglobin: 11.6 g/dL — ABNORMAL LOW (ref 13.0–17.0)
Hemoglobin: 12.5 g/dL — ABNORMAL LOW (ref 13.0–17.0)
MCH: 31.3 pg (ref 26.0–34.0)
MCH: 31.4 pg (ref 26.0–34.0)
MCHC: 32.4 g/dL (ref 30.0–36.0)
MCHC: 32.5 g/dL (ref 30.0–36.0)
MCV: 96.5 fL (ref 80.0–100.0)
MCV: 96.7 fL (ref 80.0–100.0)
Platelets: 120 10*3/uL — ABNORMAL LOW (ref 150–400)
Platelets: 129 10*3/uL — ABNORMAL LOW (ref 150–400)
RBC: 3.71 MIL/uL — ABNORMAL LOW (ref 4.22–5.81)
RBC: 3.98 MIL/uL — ABNORMAL LOW (ref 4.22–5.81)
RDW: 13.3 % (ref 11.5–15.5)
RDW: 13.3 % (ref 11.5–15.5)
WBC: 14.2 10*3/uL — ABNORMAL HIGH (ref 4.0–10.5)
WBC: 14.8 10*3/uL — ABNORMAL HIGH (ref 4.0–10.5)
nRBC: 0 % (ref 0.0–0.2)
nRBC: 0 % (ref 0.0–0.2)

## 2021-06-02 LAB — CBG MONITORING, ED
Glucose-Capillary: 134 mg/dL — ABNORMAL HIGH (ref 70–99)
Glucose-Capillary: 113 mg/dL — ABNORMAL HIGH (ref 70–99)
Glucose-Capillary: 100 mg/dL — ABNORMAL HIGH (ref 70–99)

## 2021-06-02 LAB — BASIC METABOLIC PANEL
Anion gap: 12 (ref 5–15)
BUN: 53 mg/dL — ABNORMAL HIGH (ref 8–23)
CO2: 20 mmol/L — ABNORMAL LOW (ref 22–32)
Calcium: 8.8 mg/dL — ABNORMAL LOW (ref 8.9–10.3)
Chloride: 104 mmol/L (ref 98–111)
Creatinine, Ser: 5.06 mg/dL — ABNORMAL HIGH (ref 0.61–1.24)
GFR, Estimated: 12 mL/min — ABNORMAL LOW (ref >60)
Glucose, Bld: 137 mg/dL — ABNORMAL HIGH (ref 70–99)
Potassium: 4.5 mmol/L (ref 3.5–5.1)
Sodium: 136 mmol/L (ref 135–145)

## 2021-06-02 LAB — HEMOGLOBIN A1C
Hgb A1c MFr Bld: 5.6 % (ref 4.8–5.6)
Mean Plasma Glucose: 114 mg/dL

## 2021-06-02 LAB — URINALYSIS, ROUTINE W REFLEX MICROSCOPIC
Bilirubin Urine: NEGATIVE
Glucose, UA: 150 mg/dL — AB
Ketones, ur: NEGATIVE mg/dL
Nitrite: NEGATIVE
Protein, ur: 100 mg/dL — AB
Specific Gravity, Urine: 1.02 (ref 1.005–1.030)
WBC, UA: >50 WBC/hpf — ABNORMAL HIGH (ref 0–5)
pH: 5 (ref 5.0–8.0)

## 2021-06-02 LAB — GLUCOSE, CAPILLARY
Glucose-Capillary: 104 mg/dL — ABNORMAL HIGH (ref 70–99)
Glucose-Capillary: 77 mg/dL (ref 70–99)
Glucose-Capillary: 81 mg/dL (ref 70–99)
Glucose-Capillary: 83 mg/dL (ref 70–99)
Glucose-Capillary: 87 mg/dL (ref 70–99)
Glucose-Capillary: 88 mg/dL (ref 70–99)
Glucose-Capillary: 98 mg/dL (ref 70–99)

## 2021-06-02 LAB — BASIC METABOLIC PANEL WITH GFR
Anion gap: 13 (ref 5–15)
BUN: 45 mg/dL — ABNORMAL HIGH (ref 8–23)
CO2: 18 mmol/L — ABNORMAL LOW (ref 22–32)
Calcium: 7.7 mg/dL — ABNORMAL LOW (ref 8.9–10.3)
Chloride: 110 mmol/L (ref 98–111)
Creatinine, Ser: 4.51 mg/dL — ABNORMAL HIGH (ref 0.61–1.24)
GFR, Estimated: 14 mL/min — ABNORMAL LOW
Glucose, Bld: 95 mg/dL (ref 70–99)
Potassium: 3.8 mmol/L (ref 3.5–5.1)
Sodium: 141 mmol/L (ref 135–145)

## 2021-06-02 LAB — MRSA NEXT GEN BY PCR, NASAL: MRSA by PCR Next Gen: NOT DETECTED

## 2021-06-02 LAB — APTT
aPTT: 146 seconds — ABNORMAL HIGH (ref 24–36)
aPTT: 147 s — ABNORMAL HIGH (ref 24–36)
aPTT: 92 seconds — ABNORMAL HIGH (ref 24–36)

## 2021-06-02 LAB — HEPARIN LEVEL (UNFRACTIONATED)
Heparin Unfractionated: >1.1 IU/mL — ABNORMAL HIGH (ref 0.30–0.70)
Heparin Unfractionated: >1.1 [IU]/mL — ABNORMAL HIGH (ref 0.30–0.70)

## 2021-06-02 MED ORDER — SODIUM CHLORIDE 0.9 % IV SOLN: INTRAVENOUS | Status: DC

## 2021-06-02 MED ORDER — SODIUM CHLORIDE 0.9 % IV SOLN
1.5000 g | Freq: Two times a day (BID) | INTRAVENOUS | Status: DC
  Administered 2021-06-02 – 2021-06-05 (×6): 1.5 g via INTRAVENOUS
  Filled 2021-06-02 (×3): qty 4
  Filled 2021-06-02: qty 1.5
  Filled 2021-06-02 (×2): qty 4
  Filled 2021-06-02: qty 1.5
  Filled 2021-06-02: qty 4

## 2021-06-02 MED ORDER — ORAL CARE MOUTH RINSE
15.0000 mL | Freq: Two times a day (BID) | OROMUCOSAL | Status: DC
  Administered 2021-06-02 – 2021-06-11 (×15): 15 mL via OROMUCOSAL

## 2021-06-02 NOTE — Progress Notes (Signed)
[place] Progress Note Patient Name: Ross [name] DOB: 1956/12/01 MRN: [id number]   Date of Service  06/02/2021  HPI/Events of Note  RN requests orders for AM labs. Also notes oliguria.  Patient is s/p cardiac arrest with prolonged resuscitation in evening of 6/11. Cr rose to 2.67 yesterday.    eICU Interventions  Ordered AM CBC + BMP.  Very high likelihood of ATN in setting of prolonged arrest as cause of oliguria. Supportive care at this time.     Intervention Category Minor Interventions: Other:  Charlott Rakes 06/02/2021, 4:52 AM

## 2021-06-02 NOTE — Consult Note (Signed)
WOC Nurse Consult Note: Reason for Consult:Extravasation of norepinephrine, stabilized with raised hematoma and surrounding dark discoloration to left forearm.  Distal fingers are warm and nail beds blanche immediately.   Wound type:extravasation Pressure Injury POA: NA Measurement: Hematoma:  4 cm x 4 cm raised hematoma. Surrounding discoloration extends 5 cm  Wound JGY:LUDAPT hematoma at this time Drainage (amount, consistency, odor) none Periwound: discoloration present Dressing procedure/placement/frequency: Cleanse left forearm with Ns and pat dry.  Apply Xeroform gauze to area. Cover with kerlix and tape.  Change daily.  Will not follow at this time.  Please re-consult if needed.  Domenic Moras MSN, RN, FNP-BC CWON Wound, Ostomy, Continence Nurse Pager [phone]

## 2021-06-02 NOTE — Progress Notes (Addendum)
Progress Note  Patient Name: Ross [name] Date of Encounter: 06/02/2021  [facility] Cardiologist: Sinclair Grooms, MD   Subjective   What happened to me?  NO CP outside of chest wall, denies SOB  Inpatient Medications    Scheduled Meds:  aspirin  81 mg Oral Daily   atorvastatin  80 mg Oral Daily   chlorhexidine gluconate (MEDLINE KIT)  15 mL Mouth Rinse BID   Chlorhexidine Gluconate Cloth  6 each Topical Daily   insulin aspart  0-9 Units Subcutaneous Q4H   mouth rinse  15 mL Mouth Rinse 10 times per day   sodium chloride flush  10-40 mL Intracatheter Q12H   sodium chloride flush  3 mL Intravenous Q12H   Continuous Infusions:  sodium chloride     sodium chloride     amiodarone 30 mg/hr (06/02/21 0300)   ampicillin-sulbactam (UNASYN) IV 1.5 g (06/02/21 0508)   famotidine (PEPCID) IV Stopped (06/01/21 2224)   heparin 1,250 Units/hr (06/02/21 0300)   PRN Meds: sodium chloride, acetaminophen, ALPRAZolam, ipratropium-albuterol, ondansetron (ZOFRAN) IV, oxyCODONE-acetaminophen, sodium chloride flush, sodium chloride flush   Vital Signs    Vitals:   06/02/21 0400 06/02/21 0500 06/02/21 0600 06/02/21 0700  BP: (!) 142/68 (!) 123/54 (!) 134/56 (!) 140/48  Pulse: 69 (!) 48 70 69  Resp: _0 (!) 26  Temp: 100.2 F (37.9 C) 100 F (37.8 C) 99.9 F (37.7 C) 99.5 F (37.5 C)  TempSrc:      SpO2: 98% 97% 99% 95%  Weight:      Height:        Intake/Output Summary (Last 24 hours) at 06/02/2021 0813 Last data filed at 06/02/2021 0600 Gross per 24 hour  Intake 840.34 ml  Output 517 ml  Net 323.34 ml   Last 3 Weights 05/31/2021 05/29/2021 04/14/2021  Weight (lbs) 241 lb 6.5 oz 238 lb 240 lb 3.2 oz  Weight (kg) 109.5 kg 107.956 kg 108.954 kg      Telemetry    SR 60's, intermittently V paced (asynchronous) - Personally Reviewed  ECG    No new EKGs - Personally Reviewed  Physical Exam   GEN: No acute distress, confused on details but oriented, appears  disheveled, chronically ill appearing Neck: No JVD Cardiac: RRR, no murmurs, rubs, or gallops.  Respiratory: soft exp wheezes (ant) GI: Soft, nontender, non-distended  MS: No edema; No deformity. LLE thigh/knee ecchymosis, L forearm ecchymosis, blisters Neuro:  Nonfocal  Psych: Normal affect   Labs    High Sensitivity Troponin:  No results for input(s): TROPONINIHS in the last 720 hours.    Chemistry Recent Labs  Lab 06/01/21 0124 06/01/21 0303 06/02/21 0450  NA 140 136 141  K 3.9 3.6 3.8  CL  --  110 110  CO2  --  18* 18*  GLUCOSE  --  173* 95  BUN  --  34* 45*  CREATININE  --  2.67* 4.51*  CALCIUM  --  9.9 7.7*  GFRNONAA  --  26* 14*  ANIONGAP  --  8 13     Hematology Recent Labs  Lab 06/01/21 0124 06/01/21 0303 06/02/21 0450  WBC  --  24.8* 14.2*  RBC  --  4.81 3.71*  HGB 15.3 15.0 11.6*  HCT 45.0 44.9 35.8*  MCV  --  93.3 96.5  MCH  --  31.2 31.3  MCHC  --  33.4 32.4  RDW  --  13.1 13.3  PLT  --  190 120*    BNPNo results for input(s): BNP, PROBNP in the last 168 hours.   DDimer No results for input(s): DDIMER in the last 168 hours.   Radiology     DG CHEST PORT 1 VIEW Result Date: 06/01/2021 CLINICAL DATA:  PICC line placement EXAM: PORTABLE CHEST 1 VIEW COMPARISON:  One day prior FINDINGS: Endotracheal tube terminates 4.8 cm above carina. Nasogastric tube extends beyond the inferior aspect of the film. Single lead pacer. Right PICC line has been placed in the interval. Terminates at either the superior caval/atrial junction or high right atrium. Midline trachea. Borderline cardiomegaly, accentuated by AP portable technique. No pleural effusion or pneumothorax. No congestive failure. Clear lungs. IMPRESSION: Right PICC line terminating at the superior caval/atrial junction or high right atrium. No pneumothorax or other acute complication. Electronically Signed   By: Abigail Miyamoto M.D.   On: 06/01/2021 07:33    Cardiac Studies    06/01/21:  TTE IMPRESSIONS   1. Left ventricular ejection fraction, by estimation, is 35 to 40%. The  left ventricle has moderately decreased function. The left ventricle has  no regional wall motion abnormalities. There is severe concentric left  ventricular hypertrophy. Left  ventricular diastolic parameters are indeterminate. Elevated left  ventricular end-diastolic pressure. There is akinesis of the left  ventricular, mid anteroseptal wall. There is akinesis of the left  ventricular, apical septal wall, inferior wall and  anterior wall. There is akinesis of the left ventricular, apical segment.   2. Right ventricular systolic function was not well visualized. The right  ventricular size is mildly enlarged. Tricuspid regurgitation signal is  inadequate for assessing PA pressure.   3. Right atrial size was severely dilated.   4. The mitral valve is normal in structure. Trivial mitral valve  regurgitation. No evidence of mitral stenosis.   5. The aortic valve is tricuspid. Aortic valve regurgitation is not  visualized. Mild aortic valve sclerosis is present, with no evidence of  aortic valve stenosis.   6. Aortic dilatation noted. There is mild dilatation of the aortic root,  measuring 41 mm.   7. The inferior vena cava is dilated in size with >50% respiratory  variability, suggesting right atrial pressure of 8 mmHg.    05/31/21; LHC Prox RCA to Mid RCA lesion is 99% stenosed. Dist RCA lesion is 80% stenosed. Ramus lesion is 100% stenosed. Origin to Prox Graft lesion is 100% stenosed. Origin lesion is 100% stenosed. Prox LAD lesion is 70% stenosed. Prox LAD to Mid LAD lesion is 70% stenosed. Mid LAD-1 lesion is 90% stenosed. Mid LAD-2 lesion is 80% stenosed.   VF cardiac arrest with prolonged resuscitative effort and ultimate restoration of paced rhythm following multiple shocks, amiodarone, and Levophed infusion.   Severe native CAD with diffuse severe proximal LAD stenoses of 70 to 80%  with focal 90% stenosis between the first and second septal perforating artery with total occlusion of the first diagonal vessel and a flush and fill phenomena of the mid LAD secondary to competitive filling via the LIMA graft.   Old occlusion of the ramus immediate vessel and obtuse marginal vessel   Dominant RCA with previously noted  diffuse 90% mid stenosis which now has right to right collateralization and diffuse 80% distal stenosis.   Patent LIMA graft supplying the mid LAD.   Patent SVG supplying the first diagonal vessel.   Old occlusion of the vein graft to a ramus intermediate vessel.   Old occlusion of the vein graft  which had supplied the distal RCA.   LVEDP 20 mmHg   RECOMMENDATION: Patient is admitted to the critical care service.  We  plan 2D echo Doppler study in a.m.  He has been seen by Dr. Rayann Heman in the emergency room during his prolonged resuscitative efforts today.  Guideline directed medical therapy for CHF.  Anticipate ICD implantation per electrophysiology.     02/27/21: TTE IMPRESSIONS   1. There is diffuse hypokinesis with paradoxical septal motion and apical  aneurysm with LVEF 25-30%.   2. Left ventricular ejection fraction, by estimation, is 25 to 30%. The  left ventricle has severely decreased function. The left ventricle  demonstrates global hypokinesis. The left ventricular internal cavity size  was moderately dilated. There is  moderate concentric left ventricular hypertrophy. Left ventricular  diastolic function could not be evaluated.   3. Right ventricular systolic function is mildly reduced. The right  ventricular size is mildly enlarged. There is normal pulmonary artery  systolic pressure.   4. Left atrial size was moderately dilated.   5. Right atrial size was mildly dilated.   6. The mitral valve is normal in structure. Mild mitral valve  regurgitation. No evidence of mitral stenosis.   7. The aortic valve is normal in structure.  Aortic valve regurgitation is  not visualized. No aortic stenosis is present.   8. Aortic dilatation noted. There is mild dilatation of the ascending  aorta, measuring 41 mm.   9. The inferior vena cava is normal in size with greater than 50%  respiratory variability, suggesting right atrial pressure of 3 mmHg.   Patient Profile     65 y.o. male with PMHx of CAD (CABG), ICM (new), permanent AFib, symptomatic bradycardia with PPM, DM, obesity  Assessment & Plan    1.  S/p VF arrest      Review of cahrt notes an hour of CPR and numerous defibrillations 2. Respiratoy failure     Soft exp wheezes     CCM is on board     Antibiotics for presumed aspiration with prolonged code 3. Shock     He has made remarkable early recovery.       Extubated yesterday     Off pressor  AAO x3, though no independent knowledge of events and having trouble with short term memory  Continue amiodarone gtt today        2. Ischemic CM/ CAD s/p CABG/ acute on chronic systolic dysfunction No targets for intervention Continue management with attending cards team   3. Afib Permanent by history (SR now s/p numerous defibrillations) On amio for his arrest, suspect he  not hold sinus CHA2DS2Vasc is at least 4 Eliquis out pat, heparin gtt here  4. AKI Attending team is following  Some urine in foley bag, not much charted for out put Nephrology has been consulted   Dr. Curt Bears has seen and examined the patient this AM  need an ICD, perhaps CRT-D though he has a very worrisome L forearm NE infiltration with blisters.  Attending cards already consulted wound care though may need surgical intervention We  defer to attending team though pending on course of this we may need to hold off and consider life vest as a bridge    For questions or updates, please contact [place] Please consult [email or web address] for contact info under        Signed, Baldwin Jamaica, PA-C  06/02/2021, 8:13 AM     I have seen and examined this patient  with Tommye Standard.  Agree with above, note added to reflect my findings.  On exam, RRR, no murmurs, lungs wheezing, ecchymosis of left anterior leg, necrotic area left forearm.  Patient is made a remarkable recovery from his recent ventricular fibrillation arrest.  He is awake and alert and only mildly confused.  He is in acute renal failure likely due to ATN.  Hopefully this  improve throughout his hospitalization.  He unfortunately has an area of necrosis over his left forearm.  He  need ICD implant, though with this area of necrosis, looked at by here.  He may benefit from a LifeVest at discharge and follow-up for outpatient device upgrade.  We  continue to monitor.  Hopefully, things stabilized in his arm heals, we can plan ICD this hospitalization.     M.  MD 06/02/2021 10:58 AM

## 2021-06-02 NOTE — Progress Notes (Signed)
Progress Note  Patient Name: Ross [name] Date of Encounter: 06/02/2021  [facility] Cardiologist: Lesleigh Noe, MD   Subjective   Patient alert and communicative.  No complaints of ischemic chest pain but does have chest wall pain from CPR/defibrillation.  Oriented but occasionally somewhat confused.  Inpatient Medications    Scheduled Meds:  aspirin  81 mg Oral Daily   atorvastatin  80 mg Oral Daily   chlorhexidine gluconate (MEDLINE KIT)  15 mL Mouth Rinse BID   Chlorhexidine Gluconate Cloth  6 each Topical Daily   insulin aspart  0-9 Units Subcutaneous Q4H   mouth rinse  15 mL Mouth Rinse 10 times per day   sodium chloride flush  10-40 mL Intracatheter Q12H   sodium chloride flush  3 mL Intravenous Q12H   Continuous Infusions:  sodium chloride     sodium chloride     amiodarone 30 mg/hr (06/02/21 0300)   ampicillin-sulbactam (UNASYN) IV 1.5 g (06/02/21 0508)   famotidine (PEPCID) IV Stopped (06/01/21 2224)   heparin 1,250 Units/hr (06/02/21 0300)   norepinephrine (LEVOPHED) Adult infusion Stopped (06/01/21 0811)   PRN Meds: sodium chloride, acetaminophen, ALPRAZolam, ipratropium-albuterol, ondansetron (ZOFRAN) IV, oxyCODONE-acetaminophen, sodium chloride flush, sodium chloride flush   Vital Signs    Vitals:   06/02/21 0400 06/02/21 0500 06/02/21 0600 06/02/21 0700  BP: (!) 142/68 (!) 123/54 (!) 134/56 (!) 140/48  Pulse: 69 (!) 48 70 69  Resp: 17 19 18  (!) 26  Temp: 100.2 F (37.9 C) 100 F (37.8 C) 99.9 F (37.7 C) 99.5 F (37.5 C)  TempSrc:      SpO2: 98% 97% 99% 95%  Weight:      Height:        Intake/Output Summary (Last 24 hours) at 06/02/2021 0759 Last data filed at 06/02/2021 0600 Gross per 24 hour  Intake 944.93 ml  Output 567 ml  Net 377.93 ml   Last 3 Weights 05/31/2021 05/29/2021 04/14/2021  Weight (lbs) 241 lb 6.5 oz 238 lb 240 lb 3.2 oz  Weight (kg) 109.5 kg 107.956 kg 108.954 kg      Telemetry    Sinus rhythm/paced- Personally  Reviewed  ECG    None performed today however yesterday EKG showed V paced rhythm at 61- Personally Reviewed  Physical Exam   GEN: No acute distress.   Neck: No JVD Cardiac: RRR, no murmurs, rubs, or gallops.  Respiratory: Clear to auscultation bilaterally. GI: Soft, nontender, non-distended  MS: No edema; No deformity. Neuro:  Nonfocal  Psych: Normal affect   Labs    High Sensitivity Troponin:  No results for input(s): TROPONINIHS in the last 720 hours.    Chemistry Recent Labs  Lab 06/01/21 0124 06/01/21 0303 06/02/21 0450  NA 140 136 141  K 3.9 3.6 3.8  CL  --  110 110  CO2  --  18* 18*  GLUCOSE  --  173* 95  BUN  --  34* 45*  CREATININE  --  2.67* 4.51*  CALCIUM  --  9.9 7.7*  GFRNONAA  --  26* 14*  ANIONGAP  --  8 13     Hematology Recent Labs  Lab 06/01/21 0124 06/01/21 0303 06/02/21 0450  WBC  --  24.8* 14.2*  RBC  --  4.81 3.71*  HGB 15.3 15.0 11.6*  HCT 45.0 44.9 35.8*  MCV  --  93.3 96.5  MCH  --  31.2 31.3  MCHC  --  33.4 32.4  RDW  --  13.1 13.3  PLT  --  190 120*    BNPNo results for input(s): BNP, PROBNP in the last 168 hours.   DDimer No results for input(s): DDIMER in the last 168 hours.   Radiology    CARDIAC CATHETERIZATION  Result Date: 05/31/2021  Prox RCA to Mid RCA lesion is 99% stenosed.  Dist RCA lesion is 80% stenosed.  Ramus lesion is 100% stenosed.  Origin to Prox Graft lesion is 100% stenosed.  Origin lesion is 100% stenosed.  Prox LAD lesion is 70% stenosed.  Prox LAD to Mid LAD lesion is 70% stenosed.  Mid LAD-1 lesion is 90% stenosed.  Mid LAD-2 lesion is 80% stenosed.  VF cardiac arrest with prolonged resuscitative effort and ultimate restoration of paced rhythm following multiple shocks, amiodarone, and Levophed infusion. Severe native CAD with diffuse severe proximal LAD stenoses of 70 to 80% with focal 90% stenosis between the first and second septal perforating artery with total occlusion of the first  diagonal vessel and a flush and fill phenomena of the mid LAD secondary to competitive filling via the LIMA graft. Old occlusion of the ramus immediate vessel and obtuse marginal vessel Dominant RCA with previously noted  diffuse 90% mid stenosis which now has right to right collateralization and diffuse 80% distal stenosis. Patent LIMA graft supplying the mid LAD. Patent SVG supplying the first diagonal vessel. Old occlusion of the vein graft to a ramus intermediate vessel. Old occlusion of the vein graft which had supplied the distal RCA. LVEDP 20 mmHg RECOMMENDATION: Patient is admitted to the critical care service.  We will plan 2D echo Doppler study in a.m.  He has been seen by Dr. Rayann Heman in the emergency room during his prolonged resuscitative efforts today.  Guideline directed medical therapy for CHF.  Anticipate ICD implantation per electrophysiology.   DG CHEST PORT 1 VIEW  Result Date: 06/01/2021 CLINICAL DATA:  PICC line placement EXAM: PORTABLE CHEST 1 VIEW COMPARISON:  One day prior FINDINGS: Endotracheal tube terminates 4.8 cm above carina. Nasogastric tube extends beyond the inferior aspect of the film. Single lead pacer. Right PICC line has been placed in the interval. Terminates at either the superior caval/atrial junction or high right atrium. Midline trachea. Borderline cardiomegaly, accentuated by AP portable technique. No pleural effusion or pneumothorax. No congestive failure. Clear lungs. IMPRESSION: Right PICC line terminating at the superior caval/atrial junction or high right atrium. No pneumothorax or other acute complication. Electronically Signed   By: Abigail Miyamoto M.D.   On: 06/01/2021 07:33   ECHOCARDIOGRAM COMPLETE  Result Date: 06/01/2021    ECHOCARDIOGRAM REPORT   Patient Name:   Ross [name] Date of Exam: 06/01/2021 Medical Rec #:  [id number]      Height:       73.5 in Accession #:    [id number]     Weight:       241.4 lb Date of Birth:  06/23/1956      BSA:          2.343  m Patient Age:    65 years       BP:           101/61 mmHg Patient Gender: M              HR:           61 bpm. Exam Location:  Inpatient Procedure: 2D Echo, Cardiac Doppler, Color Doppler and Intracardiac  Opacification Agent Indications:    Cardiac arrest  History:        Patient has prior history of Echocardiogram examinations, most                 recent 02/27/2021. CHF, CAD, Pacemaker and Prior CABG, COPD,                 Arrythmias:Atrial Fibrillation; Risk Factors:Family History of                 Coronary Artery Disease, Hypertension and Dyslipidemia. Ischemic                 cardiomyopathy.  Sonographer:    Clayton Lefort RDCS (AE) Referring Phys: [place] Comments: Technically difficult study due to poor echo windows and patient is morbidly obese. Patient in Fowler's position. IMPRESSIONS  1. Left ventricular ejection fraction, by estimation, is 35 to 40%. The left ventricle has moderately decreased function. The left ventricle has no regional wall motion abnormalities. There is severe concentric left ventricular hypertrophy. Left ventricular diastolic parameters are indeterminate. Elevated left ventricular end-diastolic pressure. There is akinesis of the left ventricular, mid anteroseptal wall. There is akinesis of the left ventricular, apical septal wall, inferior wall and anterior wall. There is akinesis of the left ventricular, apical segment.  2. Right ventricular systolic function was not well visualized. The right ventricular size is mildly enlarged. Tricuspid regurgitation signal is inadequate for assessing PA pressure.  3. Right atrial size was severely dilated.  4. The mitral valve is normal in structure. Trivial mitral valve regurgitation. No evidence of mitral stenosis.  5. The aortic valve is tricuspid. Aortic valve regurgitation is not visualized. Mild aortic valve sclerosis is present, with no evidence of aortic valve stenosis.  6. Aortic dilatation noted. There  is mild dilatation of the aortic root, measuring 41 mm.  7. The inferior vena cava is dilated in size with >50% respiratory variability, suggesting right atrial pressure of 8 mmHg. FINDINGS  Left Ventricle: Left ventricular ejection fraction, by estimation, is 35 to 40%. The left ventricle has moderately decreased function. The left ventricle has no regional wall motion abnormalities. Definity contrast agent was given IV to delineate the left ventricular endocardial borders. The left ventricular internal cavity size was normal in size. There is severe concentric left ventricular hypertrophy. Abnormal (paradoxical) septal motion, consistent with left bundle branch block. Left ventricular diastolic parameters are indeterminate. Elevated left ventricular end-diastolic pressure. Right Ventricle: The right ventricular size is mildly enlarged. Right vetricular wall thickness was not assessed. Right ventricular systolic function was not well visualized. Tricuspid regurgitation signal is inadequate for assessing PA pressure. Left Atrium: Left atrial size was normal in size. Right Atrium: Right atrial size was severely dilated. Pericardium: There is no evidence of pericardial effusion. Mitral Valve: The mitral valve is normal in structure. Trivial mitral valve regurgitation. No evidence of mitral valve stenosis. MV peak gradient, 1.8 mmHg. The mean mitral valve gradient is 1.0 mmHg. Tricuspid Valve: The tricuspid valve is normal in structure. Tricuspid valve regurgitation is trivial. No evidence of tricuspid stenosis. Aortic Valve: The aortic valve is tricuspid. Aortic valve regurgitation is not visualized. Mild aortic valve sclerosis is present, with no evidence of aortic valve stenosis. Aortic valve mean gradient measures 3.0 mmHg. Aortic valve peak gradient measures 4.0 mmHg. Aortic valve area, by VTI measures 2.95 cm. Pulmonic Valve: The pulmonic valve was normal in structure. Pulmonic valve regurgitation is not  visualized. No evidence  of pulmonic stenosis. Aorta: Aortic dilatation noted. There is mild dilatation of the aortic root, measuring 41 mm. Venous: The inferior vena cava is dilated in size with greater than 50% respiratory variability, suggesting right atrial pressure of 8 mmHg. IAS/Shunts: No atrial level shunt detected by color flow Doppler.  LEFT VENTRICLE PLAX 2D LVIDd:         4.80 cm  Diastology LVIDs:         4.40 cm  LV e' medial:    3.69 cm/s LV PW:         1.60 cm  LV E/e' medial:  22.0 LV IVS:        1.70 cm  LV e' lateral:   7.50 cm/s LVOT diam:     2.40 cm  LV E/e' lateral: 10.8 LV SV:         49 LV SV Index:   21 LVOT Area:     4.52 cm  RIGHT VENTRICLE          IVC RV Basal diam:  4.10 cm  IVC diam: 2.50 cm TAPSE (M-mode): 1.5 cm LEFT ATRIUM             Index       RIGHT ATRIUM           Index LA diam:        4.50 cm 1.92 cm/m  RA Area:     30.30 cm LA Vol (A2C):   88.2 ml 37.65 ml/m RA Volume:   116.00 ml 49.52 ml/m LA Vol (A4C):   63.3 ml 27.02 ml/m LA Biplane Vol: 74.8 ml 31.93 ml/m  AORTIC VALVE AV Area (Vmax):    3.05 cm AV Area (Vmean):   2.65 cm AV Area (VTI):     2.95 cm AV Vmax:           99.70 cm/s AV Vmean:          79.400 cm/s AV VTI:            0.167 m AV Peak Grad:      4.0 mmHg AV Mean Grad:      3.0 mmHg LVOT Vmax:         67.20 cm/s LVOT Vmean:        46.500 cm/s LVOT VTI:          0.109 m LVOT/AV VTI ratio: 0.65  AORTA Ao Root diam: 3.90 cm Ao Asc diam:  3.60 cm MITRAL VALVE MV Area (PHT): 3.72 cm    SHUNTS MV Area VTI:   1.99 cm    Systemic VTI:  0.11 m MV Peak grad:  1.8 mmHg    Systemic Diam: 2.40 cm MV Mean grad:  1.0 mmHg MV Vmax:       0.67 m/s MV Vmean:      37.5 cm/s MV Decel Time: 204 msec MV E velocity: 81.30 cm/s MV A velocity: 33.40 cm/s MV E/A ratio:  2.43 Fransico Him MD Electronically signed by Fransico Him MD Signature Date/Time: 06/01/2021/10:42:48 AM    Final    [place] EKG SITE RITE  Result Date: 06/01/2021 If Site Rite image not attached, placement could  not be confirmed due to current cardiac rhythm.   Cardiac Studies   2D echocardiogram (06/01/2021)  IMPRESSIONS     1. Left ventricular ejection fraction, by estimation, is 35 to 40%. The  left ventricle has moderately decreased function. The left ventricle has  no regional wall motion abnormalities. There is severe concentric left  ventricular hypertrophy. Left  ventricular diastolic parameters are indeterminate. Elevated left  ventricular end-diastolic pressure. There is akinesis of the left  ventricular, mid anteroseptal wall. There is akinesis of the left  ventricular, apical septal wall, inferior wall and  anterior wall. There is akinesis of the left ventricular, apical segment.   2. Right ventricular systolic function was not well visualized. The right  ventricular size is mildly enlarged. Tricuspid regurgitation signal is  inadequate for assessing PA pressure.   3. Right atrial size was severely dilated.   4. The mitral valve is normal in structure. Trivial mitral valve  regurgitation. No evidence of mitral stenosis.   5. The aortic valve is tricuspid. Aortic valve regurgitation is not  visualized. Mild aortic valve sclerosis is present, with no evidence of  aortic valve stenosis.   6. Aortic dilatation noted. There is mild dilatation of the aortic root,  measuring 41 mm.   7. The inferior vena cava is dilated in size with >50% respiratory  variability, suggesting right atrial pressure of 8 mmHg.  Cardiac catheterization (06/01/2019)  Conclusion    Prox RCA to Mid RCA lesion is 99% stenosed. Dist RCA lesion is 80% stenosed. Ramus lesion is 100% stenosed. Origin to Prox Graft lesion is 100% stenosed. Origin lesion is 100% stenosed. Prox LAD lesion is 70% stenosed. Prox LAD to Mid LAD lesion is 70% stenosed. Mid LAD-1 lesion is 90% stenosed. Mid LAD-2 lesion is 80% stenosed.   VF cardiac arrest with prolonged resuscitative effort and ultimate restoration of paced  rhythm following multiple shocks, amiodarone, and Levophed infusion.   Severe native CAD with diffuse severe proximal LAD stenoses of 70 to 80% with focal 90% stenosis between the first and second septal perforating artery with total occlusion of the first diagonal vessel and a flush and fill phenomena of the mid LAD secondary to competitive filling via the LIMA graft.   Old occlusion of the ramus immediate vessel and obtuse marginal vessel   Dominant RCA with previously noted  diffuse 90% mid stenosis which now has right to right collateralization and diffuse 80% distal stenosis.   Patent LIMA graft supplying the mid LAD.   Patent SVG supplying the first diagonal vessel.   Old occlusion of the vein graft to a ramus intermediate vessel.   Old occlusion of the vein graft which had supplied the distal RCA.   LVEDP 20 mmHg   RECOMMENDATION: Patient is admitted to the critical care service.  We will plan 2D echo Doppler study in a.m.  He has been seen by Dr. Rayann Heman in the emergency room during his prolonged resuscitative efforts today.  Guideline directed medical therapy for CHF.  Anticipate ICD implantation per electrophysiology.  Coronary Diagrams   Diagnostic Dominance: Right    Intervention     Patient Profile     NEERAJ HOUSAND is a 65 y.o. male with known ischemic CM (EF 25%), prior CABG, permanent afib, and DM who is admitted with VF arrest.  Assessment & Plan    1: Status post VF arrest-patient had witnessed cardiac arrest, defibrillation x10 and CPR with ROSC.  He was intubated and transported to [facility].  He currently has a paced rhythm.  IV amiodarone has continued.  He has been extubated and is hemodynamically stable.  He is off all pressor agents.  Amazingly, he is neurologically intact on with only mild mild intermittent confusion.  2: Ischemic cardiomyopathy-EF in the 30% range in the past and currently 35 to  40% by 2D echo.  He was on guideline  directed optimal medical therapy prior to admission followed by Dr. Tamala Julian.  Currently all meds are on hold.  These will be reinstituted depending on his clinical improvement in renal status.  Cardiac catheterization performed by Dr. Claiborne Billings on 05/31/2021 at the time of presentation showed a patent vein to ramus branch, patent LIMA to the LAD, occluded vein to the RCA with occluded native RCA and occluded vein to a diagonal branch.  Medical therapy was recommended for this.  There were no "culprit lesions".  3: Atrial fibrillation-history of permanent A. fib on Eliquis oral anticoagulation as an outpatient.  Currently he appears to be V paced.  He is on IV heparin.  Will transition back to Eliquis after his ICD upgrade  4: Secondary AV block-history of permanent transvenous pacemaker placed in the past for this.  [employer] to interrogate.  Plan for CRT upgrade this week.  Dr. Curt Bears aware  5: Acute renal insufficiency-serum creatinine increased from 1.2-4.5 probably related to ATN from his acute event.  He is making urine.  I notified the renal surgery service (Dr. Hollie Salk) to assist in his care.  Hopefully this will ultimately resolve.  6: Left upper extremity skin necrosis-probably related to IV infiltration of his norepinephrine.  We will get wound care to evaluate.     For questions or updates, please contact [place] Please consult [email or web address] for contact info under        Signed, Quay Burow, MD  06/02/2021, 7:59 AM

## 2021-06-02 NOTE — Progress Notes (Signed)
ANTICOAGULATION CONSULT NOTE - Follow Up Consult  Pharmacy Consult for IV heparin  Indication: atrial fibrillation  No Known Allergies  Patient Measurements: Height: 6' 1.5" (186.7 cm) Weight: 109.5 kg (241 lb 6.5 oz) IBW/kg (Calculated) : 81.05 Heparin Dosing Weight: 103 kg  Vital Signs: Temp: 98 F (36.7 C) (06/13 1100) Temp Source: Bladder (06/13 0800) BP: 127/49 (06/13 1302) Pulse Rate: 65 (06/13 1302)  Labs: Recent Labs    06/01/21 0124 06/01/21 0303 06/02/21 0318 06/02/21 0450 06/02/21 0800 06/02/21 0802  HGB 15.3 15.0  --  11.6*  --   --   HCT 45.0 44.9  --  35.8*  --   --   PLT  --  190  --  120*  --   --   APTT  --   --  146*  --   --  147*  HEPARINUNFRC  --   --  >1.10*  --  >1.10*  --   CREATININE  --  2.67*  --  4.51*  --   --      Estimated Creatinine Clearance: 21.1 mL/min (A) (by C-G formula based on SCr of 4.51 mg/dL (H)).   Medications:  Infusions:   sodium chloride     sodium chloride     sodium chloride 50 mL/hr at 06/02/21 1303   amiodarone 30 mg/hr (06/02/21 1300)   ampicillin-sulbactam (UNASYN) IV     heparin 1,050 Units/hr (06/02/21 1300)    Assessment: 65 YO M on apixaban PTA for Afib, last dose unknown at this time given patient arrived in ED undergoing CPR for Vfib for approximately 2 hours. 6/11 Cath showed stable coronaries compared to 2018. Pharmacy consulted to start IV heparin while holding apixaban for procedures.    H/H stable, plt wnl. Heparin drip 1250 uts/hr HL >1.1 > goal but this level is affected by apixaban.  Aptt 147sec > goal - will reduce heparin drip   Goal of Therapy:  Heparin level 0.3-0.7 units/ml Aptt 66-103 sec  Monitor platelets by anticoagulation protocol: Yes   Plan:  Decrease heparin 1050 units/hr  Daily heparin level, aPTT and CBC. Per EP - keep on heparin until device upgrade (then back to [place]).   Bonnita Nasuti Pharm.D. CPP, BCPS Clinical Pharmacist [phone] 06/02/2021 1:14 PM   [facility]  pharmacy phone numbers are listed on [email or web address]

## 2021-06-02 NOTE — Progress Notes (Signed)
[place] for IV heparin  Indication: atrial fibrillation  No Known Allergies  Patient Measurements: Height: 6' 1.5" (186.7 cm) Weight: 109.5 kg (241 lb 6.5 oz) IBW/kg (Calculated) : 81.05 Heparin Dosing Weight: 103 kg  Vital Signs: Temp: 98.2 F (36.8 C) (06/13 1930) Temp Source: Oral (06/13 1930) BP: 139/65 (06/13 2000) Pulse Rate: 56 (06/13 2000)  Labs: Recent Labs    06/01/21 0303 06/02/21 0318 06/02/21 0450 06/02/21 0800 06/02/21 0802 06/02/21 1431 06/02/21 1607 06/02/21 2000  HGB 15.0  --  11.6*  --   --   --  12.5*  --   HCT 44.9  --  35.8*  --   --   --  38.5*  --   PLT 190  --  120*  --   --   --  129*  --   APTT  --  146*  --   --  147*  --   --  92*  HEPARINUNFRC  --  >1.10*  --  >1.10*  --   --   --   --   CREATININE 2.67*  --  4.51*  --   --  5.06*  --   --      Estimated Creatinine Clearance: 18.8 mL/min (A) (by C-G formula based on SCr of 5.06 mg/dL (H)).   Assessment: 65 YO M on apixaban PTA for Afib, last dose unknown at this time given patient arrived in ED undergoing CPR for Vfib for approximately 2 hours. 6/11 Cath showed stable coronaries compared to 2018. Pharmacy consulted to dose IV heparin while holding apixaban for procedures.    aPTT therapeutic post rate reduction earlier this afternoon.  No bleeding documented.  Goal of Therapy:  Heparin level 0.3-0.7 units/ml aPTT 66-102 sec  Monitor platelets by anticoagulation protocol: Yes   Plan:  Continue heparin gtt at 1050 units/hr  Daily heparin level, aPTT and CBC. Per EP - keep on heparin until device upgrade (then back to [place]).  Gracin Soohoo D. Mina Marble, PharmD, BCPS, [place] 06/02/2021, 8:35 PM

## 2021-06-02 NOTE — Plan of Care (Signed)
  Problem: Clinical Measurements: Goal: Ability to maintain clinical measurements within normal limits will improve Outcome: Progressing Goal: Respiratory complications will improve Outcome: Progressing Goal: Cardiovascular complication will be avoided Outcome: Progressing   Problem: Activity: Goal: Risk for activity intolerance will decrease Outcome: Progressing   Problem: Nutrition: Goal: Adequate nutrition will be maintained Outcome: Progressing   Problem: Coping: Goal: Level of anxiety will decrease Outcome: Progressing   Problem: Elimination: Goal: Will not experience complications related to bowel motility Outcome: Progressing   Problem: Pain Managment: Goal: General experience of comfort will improve Outcome: Progressing   Problem: Skin Integrity: Goal: Risk for impaired skin integrity will decrease Outcome: Not Progressing Note: Patient with severe extravasation on left posterior forearm. WOC consulted.

## 2021-06-02 NOTE — Progress Notes (Addendum)
PROGRESS NOTE    Ross [name]  VOH:[id number] DOB: 24-Apr-1956 DOA: 05/31/2021 PCP: Orpah Melter, MD  Outpatient Specialists: [facility]    Brief Narrative:   Patient presented with VF cardiac arrest and prolonged resuscitative effort. Multiple shocks amiodarone, and levophed infusion. Emergent cath showed severe native CAD. Admitted to ICU, intubated, extubated 4/12. transferred to hospitalist service 6/13.   Assessment & Plan:   Active Problems:   Cardiac arrest ([place])  # V fib arrest Cardiology following.  - continue amiodarone  # Ischemic cardiomyopathy s/p CABG # HFrEF - ef 35-40 - possible ICD vs life vest this admission - cont asa, atorvastatin  # Acute hypoxic respiratory failure Required intubation, now extubated. On 4L [place] - wean as able  # T2DM Here glucose wnl - cont SSI q4  # A fib On eliquis as outpatient - cont amio - cont heparin gtt  # Acute kidney injury Likely ATN 2/2 cardiac arrest. UOP poor - continue foley for now - NS @ 50 - monitor - nephrology consulted (cardiology spoke w/ them earlier today), will appreciate recs when available  # Presumed aspiration pneumonia Borderline febrile overnight. Had diarrhea on admission, none since, c diff neg. - cont unasyn - f/u urine and blood cultures  # Hematoma # Thrombocytopenia Left arm secondary to extravasation of norepinephrine. Wound consult nurse has evaluated. Also with significant bruising at site of attempted I/O access left anterior shin. HIT less likely given short duration of heparin (and no evidence recent heparin) and plt drop < 50% (190>120) - f/u wound care recs - monitor   DVT prophylaxis: heparin Code Status: full Family Communication: daughter updated @ bedside 6/13  Level of care: ICU Status is: Inpatient  Remains inpatient appropriate because:Inpatient level of care appropriate due to severity of illness  Dispo: The patient is from: Home               Anticipated d/c is to: SNF              Patient currently is not medically stable to d/c.   Difficult to place patient No        Consultants:  Cardiology, electrophysiology, nephrology  Procedures: LHC  Antimicrobials:  unasyn    Subjective: This mornings feels very run down. Chest wall aches. Mild dyspnea  Objective: Vitals:   06/02/21 0900 06/02/21 1000 06/02/21 1100 06/02/21 1113  BP: (!) 169/55 (!) 152/63  (!) 152/66  Pulse: 86 63 66 68  Resp: 19 17 16 18   Temp:   98 F (36.7 C)   TempSrc:      SpO2: 92% 97% 97% 100%  Weight:      Height:        Intake/Output Summary (Last 24 hours) at 06/02/2021 1146 Last data filed at 06/02/2021 1123 Gross per 24 hour  Intake 1119.29 ml  Output 567 ml  Net 552.29 ml   Filed Weights   05/31/21 2200  Weight: 109.5 kg    Examination:  General exam: Appears chronically ill Respiratory system: scatterec rhonchi Cardiovascular system: S1 & S2 heard, distant heart sounds, soft systolic murmur Gastrointestinal system: Abdomen is nondistended, soft and nontender. No organomegaly or masses felt. Normal bowel sounds heard. Central nervous system: Alert and oriented. No focal neurological deficits. Extremities: Symmetric 5 x 5 power. Skin: No rashes, lesions or ulcers Psychiatry: Judgement and insight appear normal. Mood & affect appropriate.     Data Reviewed: I have personally reviewed following labs and imaging studies  CBC: Recent Labs  Lab 06/01/21 0124 06/01/21 0303 06/02/21 0450  WBC  --  24.8* 14.2*  HGB 15.3 15.0 11.6*  HCT 45.0 44.9 35.8*  MCV  --  93.3 96.5  PLT  --  190 962*   Basic Metabolic Panel: Recent Labs  Lab 06/01/21 0124 06/01/21 0303 06/02/21 0450  NA 140 136 141  K 3.9 3.6 3.8  CL  --  110 110  CO2  --  18* 18*  GLUCOSE  --  173* 95  BUN  --  34* 45*  CREATININE  --  2.67* 4.51*  CALCIUM  --  9.9 7.7*  MG  --  3.6*  --   PHOS  --  2.8  --    GFR: Estimated Creatinine  Clearance: 21.1 mL/min (A) (by C-G formula based on SCr of 4.51 mg/dL (H)). Liver Function Tests: No results for input(s): AST, ALT, ALKPHOS, BILITOT, PROT, ALBUMIN in the last 168 hours. No results for input(s): LIPASE, AMYLASE in the last 168 hours. No results for input(s): AMMONIA in the last 168 hours. Coagulation Profile: No results for input(s): INR, PROTIME in the last 168 hours. Cardiac Enzymes: No results for input(s): CKTOTAL, CKMB, CKMBINDEX, TROPONINI in the last 168 hours. BNP (last 3 results) No results for input(s): PROBNP in the last 8760 hours. HbA1C: Recent Labs    06/01/21 0303  HGBA1C 5.6   CBG: Recent Labs  Lab 06/01/21 2016 06/02/21 0002 06/02/21 0320 06/02/21 0708 06/02/21 1109  GLUCAP 99 104* 87 77 83   Lipid Profile: No results for input(s): CHOL, HDL, LDLCALC, TRIG, CHOLHDL, LDLDIRECT in the last 72 hours. Thyroid Function Tests: No results for input(s): TSH, T4TOTAL, FREET4, T3FREE, THYROIDAB in the last 72 hours. Anemia Panel: No results for input(s): VITAMINB12, FOLATE, FERRITIN, TIBC, IRON, RETICCTPCT in the last 72 hours. Urine analysis: No results found for: COLORURINE, APPEARANCEUR, LABSPEC, PHURINE, GLUCOSEU, HGBUR, BILIRUBINUR, KETONESUR, PROTEINUR, UROBILINOGEN, NITRITE, LEUKOCYTESUR Sepsis Labs: @LABRCNTIP (procalcitonin:4,lacticidven:4)  ) Recent Results (from the past 240 hour(s))  C Difficile Quick Screen w PCR reflex     Status: None   Collection Time: 06/01/21 10:22 AM   Specimen: STOOL  Result Value Ref Range Status   C Diff antigen NEGATIVE NEGATIVE Final   C Diff toxin NEGATIVE NEGATIVE Final   C Diff interpretation No C. difficile detected.  Final    Comment: Performed at [facility], [place] [address] [place], [place] [address]  MRSA Next Gen by PCR, Nasal     Status: None   Collection Time: 06/02/21  8:30 AM  Result Value Ref Range Status   MRSA by PCR Next Gen NOT DETECTED NOT DETECTED Final    Comment:  (NOTE) The GeneXpert MRSA Assay (FDA approved for NASAL specimens only), is one component of a comprehensive MRSA colonization surveillance program. It is not intended to diagnose MRSA infection nor to guide or monitor treatment for MRSA infections. Test performance is not FDA approved in patients less than 30 years old. Performed at [facility], [place] [address] [place], [place] [address]          Radiology Studies: CARDIAC CATHETERIZATION  Result Date: 05/31/2021  Prox RCA to Mid RCA lesion is 99% stenosed.  Dist RCA lesion is 80% stenosed.  Ramus lesion is 100% stenosed.  Origin to Prox Graft lesion is 100% stenosed.  Origin lesion is 100% stenosed.  Prox LAD lesion is 70% stenosed.  Prox LAD to Mid LAD lesion is 70%  stenosed.  Mid LAD-1 lesion is 90% stenosed.  Mid LAD-2 lesion is 80% stenosed.  VF cardiac arrest with prolonged resuscitative effort and ultimate restoration of paced rhythm following multiple shocks, amiodarone, and Levophed infusion. Severe native CAD with diffuse severe proximal LAD stenoses of 70 to 80% with focal 90% stenosis between the first and second septal perforating artery with total occlusion of the first diagonal vessel and a flush and fill phenomena of the mid LAD secondary to competitive filling via the LIMA graft. Old occlusion of the ramus immediate vessel and obtuse marginal vessel Dominant RCA with previously noted  diffuse 90% mid stenosis which now has right to right collateralization and diffuse 80% distal stenosis. Patent LIMA graft supplying the mid LAD. Patent SVG supplying the first diagonal vessel. Old occlusion of the vein graft to a ramus intermediate vessel. Old occlusion of the vein graft which had supplied the distal RCA. LVEDP 20 mmHg RECOMMENDATION: Patient is admitted to the critical care service.  We will plan 2D echo Doppler study in a.m.  He has been seen by Dr. Rayann Heman in the emergency room during his prolonged resuscitative  efforts today.  Guideline directed medical therapy for CHF.  Anticipate ICD implantation per electrophysiology.   DG CHEST PORT 1 VIEW  Result Date: 06/01/2021 CLINICAL DATA:  PICC line placement EXAM: PORTABLE CHEST 1 VIEW COMPARISON:  One day prior FINDINGS: Endotracheal tube terminates 4.8 cm above carina. Nasogastric tube extends beyond the inferior aspect of the film. Single lead pacer. Right PICC line has been placed in the interval. Terminates at either the superior caval/atrial junction or high right atrium. Midline trachea. Borderline cardiomegaly, accentuated by AP portable technique. No pleural effusion or pneumothorax. No congestive failure. Clear lungs. IMPRESSION: Right PICC line terminating at the superior caval/atrial junction or high right atrium. No pneumothorax or other acute complication. Electronically Signed   By: Abigail Miyamoto M.D.   On: 06/01/2021 07:33   ECHOCARDIOGRAM COMPLETE  Result Date: 06/01/2021    ECHOCARDIOGRAM REPORT   Patient Name:   Ross [name] Date of Exam: 06/01/2021 Medical Rec #:  [id number]      Height:       73.5 in Accession #:    [id number]     Weight:       241.4 lb Date of Birth:  11-09-1956      BSA:          2.343 m Patient Age:    27 years       BP:           101/61 mmHg Patient Gender: M              HR:           61 bpm. Exam Location:  Inpatient Procedure: 2D Echo, Cardiac Doppler, Color Doppler and Intracardiac            Opacification Agent Indications:    Cardiac arrest  History:        Patient has prior history of Echocardiogram examinations, most                 recent 02/27/2021. CHF, CAD, Pacemaker and Prior CABG, COPD,                 Arrythmias:Atrial Fibrillation; Risk Factors:Family History of                 Coronary Artery Disease, Hypertension and Dyslipidemia. Ischemic  cardiomyopathy.  Sonographer:    Clayton Lefort RDCS (AE) Referring Phys: [place] Comments: Technically difficult study due to poor echo  windows and patient is morbidly obese. Patient in Fowler's position. IMPRESSIONS  1. Left ventricular ejection fraction, by estimation, is 35 to 40%. The left ventricle has moderately decreased function. The left ventricle has no regional wall motion abnormalities. There is severe concentric left ventricular hypertrophy. Left ventricular diastolic parameters are indeterminate. Elevated left ventricular end-diastolic pressure. There is akinesis of the left ventricular, mid anteroseptal wall. There is akinesis of the left ventricular, apical septal wall, inferior wall and anterior wall. There is akinesis of the left ventricular, apical segment.  2. Right ventricular systolic function was not well visualized. The right ventricular size is mildly enlarged. Tricuspid regurgitation signal is inadequate for assessing PA pressure.  3. Right atrial size was severely dilated.  4. The mitral valve is normal in structure. Trivial mitral valve regurgitation. No evidence of mitral stenosis.  5. The aortic valve is tricuspid. Aortic valve regurgitation is not visualized. Mild aortic valve sclerosis is present, with no evidence of aortic valve stenosis.  6. Aortic dilatation noted. There is mild dilatation of the aortic root, measuring 41 mm.  7. The inferior vena cava is dilated in size with >50% respiratory variability, suggesting right atrial pressure of 8 mmHg. FINDINGS  Left Ventricle: Left ventricular ejection fraction, by estimation, is 35 to 40%. The left ventricle has moderately decreased function. The left ventricle has no regional wall motion abnormalities. Definity contrast agent was given IV to delineate the left ventricular endocardial borders. The left ventricular internal cavity size was normal in size. There is severe concentric left ventricular hypertrophy. Abnormal (paradoxical) septal motion, consistent with left bundle branch block. Left ventricular diastolic parameters are indeterminate. Elevated left  ventricular end-diastolic pressure. Right Ventricle: The right ventricular size is mildly enlarged. Right vetricular wall thickness was not assessed. Right ventricular systolic function was not well visualized. Tricuspid regurgitation signal is inadequate for assessing PA pressure. Left Atrium: Left atrial size was normal in size. Right Atrium: Right atrial size was severely dilated. Pericardium: There is no evidence of pericardial effusion. Mitral Valve: The mitral valve is normal in structure. Trivial mitral valve regurgitation. No evidence of mitral valve stenosis. MV peak gradient, 1.8 mmHg. The mean mitral valve gradient is 1.0 mmHg. Tricuspid Valve: The tricuspid valve is normal in structure. Tricuspid valve regurgitation is trivial. No evidence of tricuspid stenosis. Aortic Valve: The aortic valve is tricuspid. Aortic valve regurgitation is not visualized. Mild aortic valve sclerosis is present, with no evidence of aortic valve stenosis. Aortic valve mean gradient measures 3.0 mmHg. Aortic valve peak gradient measures 4.0 mmHg. Aortic valve area, by VTI measures 2.95 cm. Pulmonic Valve: The pulmonic valve was normal in structure. Pulmonic valve regurgitation is not visualized. No evidence of pulmonic stenosis. Aorta: Aortic dilatation noted. There is mild dilatation of the aortic root, measuring 41 mm. Venous: The inferior vena cava is dilated in size with greater than 50% respiratory variability, suggesting right atrial pressure of 8 mmHg. IAS/Shunts: No atrial level shunt detected by color flow Doppler.  LEFT VENTRICLE PLAX 2D LVIDd:         4.80 cm  Diastology LVIDs:         4.40 cm  LV e' medial:    3.69 cm/s LV PW:         1.60 cm  LV E/e' medial:  22.0 LV IVS:  1.70 cm  LV e' lateral:   7.50 cm/s LVOT diam:     2.40 cm  LV E/e' lateral: 10.8 LV SV:         49 LV SV Index:   21 LVOT Area:     4.52 cm  RIGHT VENTRICLE          IVC RV Basal diam:  4.10 cm  IVC diam: 2.50 cm TAPSE (M-mode): 1.5 cm  LEFT ATRIUM             Index       RIGHT ATRIUM           Index LA diam:        4.50 cm 1.92 cm/m  RA Area:     30.30 cm LA Vol (A2C):   88.2 ml 37.65 ml/m RA Volume:   116.00 ml 49.52 ml/m LA Vol (A4C):   63.3 ml 27.02 ml/m LA Biplane Vol: 74.8 ml 31.93 ml/m  AORTIC VALVE AV Area (Vmax):    3.05 cm AV Area (Vmean):   2.65 cm AV Area (VTI):     2.95 cm AV Vmax:           99.70 cm/s AV Vmean:          79.400 cm/s AV VTI:            0.167 m AV Peak Grad:      4.0 mmHg AV Mean Grad:      3.0 mmHg LVOT Vmax:         67.20 cm/s LVOT Vmean:        46.500 cm/s LVOT VTI:          0.109 m LVOT/AV VTI ratio: 0.65  AORTA Ao Root diam: 3.90 cm Ao Asc diam:  3.60 cm MITRAL VALVE MV Area (PHT): 3.72 cm    SHUNTS MV Area VTI:   1.99 cm    Systemic VTI:  0.11 m MV Peak grad:  1.8 mmHg    Systemic Diam: 2.40 cm MV Mean grad:  1.0 mmHg MV Vmax:       0.67 m/s MV Vmean:      37.5 cm/s MV Decel Time: 204 msec MV E velocity: 81.30 cm/s MV A velocity: 33.40 cm/s MV E/A ratio:  2.43 Fransico Him MD Electronically signed by Fransico Him MD Signature Date/Time: 06/01/2021/10:42:48 AM    Final    [place] EKG SITE RITE  Result Date: 06/01/2021 If Site Rite image not attached, placement could not be confirmed due to current cardiac rhythm.       Scheduled Meds:  aspirin  81 mg Oral Daily   atorvastatin  80 mg Oral Daily   Chlorhexidine Gluconate Cloth  6 each Topical Daily   insulin aspart  0-9 Units Subcutaneous Q4H   mouth rinse  15 mL Mouth Rinse BID   sodium chloride flush  10-40 mL Intracatheter Q12H   sodium chloride flush  3 mL Intravenous Q12H   Continuous Infusions:  sodium chloride     sodium chloride     amiodarone 30 mg/hr (06/02/21 1000)   ampicillin-sulbactam (UNASYN) IV 1.5 g (06/02/21 1123)   famotidine (PEPCID) IV 100 mL/hr at 06/02/21 1000   heparin 1,250 Units/hr (06/02/21 1000)     LOS: 2 days    Time spent: 50 min    Desma Maxim, MD [facility]   If 7PM-7AM, please  contact night-coverage [email or web address] Password [facility] 06/02/2021, 11:46 AM

## 2021-06-03 ENCOUNTER — Encounter: Payer: Self-pay | Admitting: Podiatry

## 2021-06-03 ENCOUNTER — Inpatient Hospital Stay (HOSPITAL_COMMUNITY): Payer: Medicare Other

## 2021-06-03 DIAGNOSIS — I4819 Other persistent atrial fibrillation: Secondary | ICD-10-CM | POA: Diagnosis not present

## 2021-06-03 DIAGNOSIS — Z951 Presence of aortocoronary bypass graft: Secondary | ICD-10-CM | POA: Diagnosis not present

## 2021-06-03 DIAGNOSIS — I255 Ischemic cardiomyopathy: Secondary | ICD-10-CM

## 2021-06-03 DIAGNOSIS — I469 Cardiac arrest, cause unspecified: Secondary | ICD-10-CM | POA: Diagnosis not present

## 2021-06-03 DIAGNOSIS — I96 Gangrene, not elsewhere classified: Secondary | ICD-10-CM

## 2021-06-03 LAB — CBC
HCT: 36.4 % — ABNORMAL LOW (ref 39.0–52.0)
Hemoglobin: 11.8 g/dL — ABNORMAL LOW (ref 13.0–17.0)
MCH: 30.9 pg (ref 26.0–34.0)
MCHC: 32.4 g/dL (ref 30.0–36.0)
MCV: 95.3 fL (ref 80.0–100.0)
Platelets: 114 10*3/uL — ABNORMAL LOW (ref 150–400)
RBC: 3.82 MIL/uL — ABNORMAL LOW (ref 4.22–5.81)
RDW: 13.3 % (ref 11.5–15.5)
WBC: 13.4 10*3/uL — ABNORMAL HIGH (ref 4.0–10.5)
nRBC: 0 % (ref 0.0–0.2)

## 2021-06-03 LAB — HEPARIN LEVEL (UNFRACTIONATED): Heparin Unfractionated: >1.1 IU/mL — ABNORMAL HIGH (ref 0.30–0.70)

## 2021-06-03 LAB — BASIC METABOLIC PANEL
Anion gap: 10 (ref 5–15)
BUN: 58 mg/dL — ABNORMAL HIGH (ref 8–23)
CO2: 20 mmol/L — ABNORMAL LOW (ref 22–32)
Calcium: 8.6 mg/dL — ABNORMAL LOW (ref 8.9–10.3)
Chloride: 104 mmol/L (ref 98–111)
Creatinine, Ser: 5.76 mg/dL — ABNORMAL HIGH (ref 0.61–1.24)
GFR, Estimated: 10 mL/min — ABNORMAL LOW (ref >60)
Glucose, Bld: 131 mg/dL — ABNORMAL HIGH (ref 70–99)
Potassium: 4.5 mmol/L (ref 3.5–5.1)
Sodium: 134 mmol/L — ABNORMAL LOW (ref 135–145)

## 2021-06-03 LAB — URINE CULTURE: Culture: NO GROWTH

## 2021-06-03 LAB — GLUCOSE, CAPILLARY
Glucose-Capillary: 105 mg/dL — ABNORMAL HIGH (ref 70–99)
Glucose-Capillary: 71 mg/dL (ref 70–99)
Glucose-Capillary: 75 mg/dL (ref 70–99)
Glucose-Capillary: 76 mg/dL (ref 70–99)
Glucose-Capillary: 84 mg/dL (ref 70–99)
Glucose-Capillary: 89 mg/dL (ref 70–99)

## 2021-06-03 LAB — APTT
aPTT: 63 seconds — ABNORMAL HIGH (ref 24–36)
aPTT: 64 seconds — ABNORMAL HIGH (ref 24–36)

## 2021-06-03 LAB — IMMATURE PLATELET FRACTION: Immature Platelet Fraction: 7.7 % (ref 1.2–8.6)

## 2021-06-03 MED ORDER — POLYETHYLENE GLYCOL 3350 17 G PO PACK
17.0000 g | PACK | Freq: Every day | ORAL | Status: DC
  Administered 2021-06-03 – 2021-06-12 (×10): 17 g via ORAL
  Filled 2021-06-03 (×10): qty 1

## 2021-06-03 MED ORDER — SENNOSIDES-DOCUSATE SODIUM 8.6-50 MG PO TABS
1.0000 | ORAL_TABLET | Freq: Two times a day (BID) | ORAL | Status: DC
  Administered 2021-06-03 – 2021-06-12 (×19): 1 via ORAL
  Filled 2021-06-03 (×19): qty 1

## 2021-06-03 MED ORDER — CARVEDILOL 3.125 MG PO TABS
3.1250 mg | ORAL_TABLET | Freq: Two times a day (BID) | ORAL | Status: DC
  Administered 2021-06-03 – 2021-06-08 (×11): 3.125 mg via ORAL
  Filled 2021-06-03 (×12): qty 1

## 2021-06-03 NOTE — Progress Notes (Signed)
PT Cancellation Note  Patient Details Name: Ross [name] MRN: [id number] DOB: 07-28-1956   Cancelled Treatment:    Reason Eval/Treat Not Completed: Patient not medically ready (LUE wound and per MD and RN not yet medically appropriate yet)   Monalisa Bayless B Yoan Sallade 06/03/2021, 8:07 AM Bayard Males, PT Acute Rehabilitation Services Pager: [phone] Office: [phone]

## 2021-06-03 NOTE — Progress Notes (Signed)
Ross [name] for IV heparin  Indication: atrial fibrillation  No Known Allergies  Patient Measurements: Height: 6' 1.5" (186.7 cm) Weight: 109.5 kg (241 lb 6.5 oz) IBW/kg (Calculated) : 81.05 Heparin Dosing Weight: 103 kg  Vital Signs: Temp: 98.5 F (36.9 C) (06/14 1140) Temp Source: Oral (06/14 1140) BP: 185/92 (06/14 1500) Pulse Rate: 61 (06/14 1500)  Labs: Recent Labs    06/02/21 0318 06/02/21 0450 06/02/21 0800 06/02/21 0802 06/02/21 1431 06/02/21 1607 06/02/21 2000 06/03/21 0346 06/03/21 1230  HGB  --  11.6*  --   --   --  12.5*  --  11.8*  --   HCT  --  35.8*  --   --   --  38.5*  --  36.4*  --   PLT  --  120*  --   --   --  129*  --  114*  --   APTT 146*  --   --    < >  --   --  92* 63* 64*  HEPARINUNFRC >1.10*  --  >1.10*  --   --   --   --  >1.10*  --   CREATININE  --  4.51*  --   --  5.06*  --   --  5.76*  --    < > = values in this interval not displayed.     Estimated Creatinine Clearance: 16.5 mL/min (A) (by C-G formula based on SCr of 5.76 mg/dL (H)).   Assessment: 65 YO M on apixaban PTA for Afib, last dose unknown at this time given patient arrived in ED undergoing CPR for Vfib for approximately 2 hours. 6/11 Cath showed stable coronaries compared to 2018. Pharmacy consulted to dose IV heparin while holding apixaban for procedures.  Heparin level elevated from apixaban use  - will use aptt for heparin dosing for now.  aPTT 64sec at bottom of therapeutic range on heparin drip 1100 uts/hr. CBC stable and no bleeding documented.  Arm and leg wound from extravasation - will run anticoagulation on low end while formulating a plan.   Back in aFib CVR 60 on amiodarone.    Goal of Therapy:  Heparin level 0.3-0.7 units/ml aPTT 66-102 sec  Monitor platelets by anticoagulation protocol: Yes   Plan:  Continue heparin gtt at 1100 units/hr  Daily heparin level, aPTT and CBC. Per EP - keep on heparin until device upgrade  (then back to [place]).  Bonnita Nasuti Pharm.D. CPP, BCPS Clinical Pharmacist [phone] 06/03/2021 3:24 PM

## 2021-06-03 NOTE — Progress Notes (Signed)
ANTICOAGULATION CONSULT NOTE - Follow Up Consult  Pharmacy Consult for heparin Indication: atrial fibrillation   Labs: Recent Labs    06/02/21 0318 06/02/21 0450 06/02/21 0800 06/02/21 0802 06/02/21 1431 06/02/21 1607 06/02/21 2000 06/03/21 0346  HGB  --  11.6*  --   --   --  12.5*  --  11.8*  HCT  --  35.8*  --   --   --  38.5*  --  36.4*  PLT  --  120*  --   --   --  129*  --  114*  APTT 146*  --   --  147*  --   --  92* 63*  HEPARINUNFRC >1.10*  --  >1.10*  --   --   --   --  >1.10*  CREATININE  --  4.51*  --   --  5.06*  --   --  5.76*    Assessment: 64yo male subtherapeutic on heparin after one PTT at goal; no gtt issues or signs of bleeding per RN.  Goal of Therapy:  aPTT 66-102 seconds   Plan:  Will increase heparin gtt slightly to 1100 units/hr and check PTT  in 8 hours.    Wynona Neat, PharmD, BCPS  06/03/2021,4:25 AM

## 2021-06-03 NOTE — Progress Notes (Signed)
Progress Note  Patient Name: Ross [name] Date of Encounter: 06/03/2021  [facility] Cardiologist: Sinclair Grooms, MD   Subjective   Patient alert and communicative.  Oriented but occasionally somewhat confused.  Inpatient Medications    Scheduled Meds:  aspirin  81 mg Oral Daily   atorvastatin  80 mg Oral Daily   Chlorhexidine Gluconate Cloth  6 each Topical Daily   insulin aspart  0-9 Units Subcutaneous Q4H   mouth rinse  15 mL Mouth Rinse BID   sodium chloride flush  10-40 mL Intracatheter Q12H   sodium chloride flush  3 mL Intravenous Q12H   Continuous Infusions:  sodium chloride     sodium chloride     sodium chloride 50 mL/hr at 06/03/21 0600   amiodarone 30 mg/hr (06/03/21 0600)   ampicillin-sulbactam (UNASYN) IV Stopped (06/02/21 0814)   heparin 1,100 Units/hr (06/03/21 0600)   PRN Meds: sodium chloride, acetaminophen, ALPRAZolam, ipratropium-albuterol, ondansetron (ZOFRAN) IV, oxyCODONE-acetaminophen, sodium chloride flush, sodium chloride flush   Vital Signs    Vitals:   06/03/21 0500 06/03/21 0600 06/03/21 0700 06/03/21 0735  BP: (!) 137/59 (!) 111/53 (!) 169/59   Pulse: (!) 59 (!) 59 61   Resp: 17 17 17    Temp:    98.4 F (36.9 C)  TempSrc:    Oral  SpO2: 90% 97% 99%   Weight:      Height:        Intake/Output Summary (Last 24 hours) at 06/03/2021 0912 Last data filed at 06/03/2021 0600 Gross per 24 hour  Intake 1956.29 ml  Output 580 ml  Net 1376.29 ml   Last 3 Weights 05/31/2021 05/29/2021 04/14/2021  Weight (lbs) 241 lb 6.5 oz 238 lb 240 lb 3.2 oz  Weight (kg) 109.5 kg 107.956 kg 108.954 kg      Telemetry    Underlying rhythm A. fib, V paced- personally Reviewed  ECG    None performed today however yesterday EKG showed V paced rhythm at 61- Personally Reviewed  Physical Exam   GEN: No acute distress.   Neck: No JVD Cardiac: RRR, no murmurs, rubs, or gallops.  Respiratory: Clear to auscultation bilaterally. GI: Soft,  nontender, non-distended  MS: No edema; No deformity. Neuro:  Nonfocal  Psych: Normal affect   Labs    High Sensitivity Troponin:  No results for input(s): TROPONINIHS in the last 720 hours.    Chemistry Recent Labs  Lab 06/02/21 0450 06/02/21 1431 06/03/21 0346  NA 141 136 134*  K 3.8 4.5 4.5  CL 110 104 104  CO2 18* 20* 20*  GLUCOSE 95 137* 131*  BUN 45* 53* 58*  CREATININE 4.51* 5.06* 5.76*  CALCIUM 7.7* 8.8* 8.6*  GFRNONAA 14* 12* 10*  ANIONGAP 13 12 10      Hematology Recent Labs  Lab 06/02/21 0450 06/02/21 1607 06/03/21 0346  WBC 14.2* 14.8* 13.4*  RBC 3.71* 3.98* 3.82*  HGB 11.6* 12.5* 11.8*  HCT 35.8* 38.5* 36.4*  MCV 96.5 96.7 95.3  MCH 31.3 31.4 30.9  MCHC 32.4 32.5 32.4  RDW 13.3 13.3 13.3  PLT 120* 129* 114*    BNPNo results for input(s): BNP, PROBNP in the last 168 hours.   DDimer No results for input(s): DDIMER in the last 168 hours.   Radiology    No results found.  Cardiac Studies   2D echocardiogram (06/01/2021)  IMPRESSIONS     1. Left ventricular ejection fraction, by estimation, is 35 to 40%. The  left  ventricle has moderately decreased function. The left ventricle has  no regional wall motion abnormalities. There is severe concentric left  ventricular hypertrophy. Left  ventricular diastolic parameters are indeterminate. Elevated left  ventricular end-diastolic pressure. There is akinesis of the left  ventricular, mid anteroseptal wall. There is akinesis of the left  ventricular, apical septal wall, inferior wall and  anterior wall. There is akinesis of the left ventricular, apical segment.   2. Right ventricular systolic function was not well visualized. The right  ventricular size is mildly enlarged. Tricuspid regurgitation signal is  inadequate for assessing PA pressure.   3. Right atrial size was severely dilated.   4. The mitral valve is normal in structure. Trivial mitral valve  regurgitation. No evidence of mitral  stenosis.   5. The aortic valve is tricuspid. Aortic valve regurgitation is not  visualized. Mild aortic valve sclerosis is present, with no evidence of  aortic valve stenosis.   6. Aortic dilatation noted. There is mild dilatation of the aortic root,  measuring 41 mm.   7. The inferior vena cava is dilated in size with >50% respiratory  variability, suggesting right atrial pressure of 8 mmHg.  Cardiac catheterization (06/01/2019)  Conclusion    Prox RCA to Mid RCA lesion is 99% stenosed. Dist RCA lesion is 80% stenosed. Ramus lesion is 100% stenosed. Origin to Prox Graft lesion is 100% stenosed. Origin lesion is 100% stenosed. Prox LAD lesion is 70% stenosed. Prox LAD to Mid LAD lesion is 70% stenosed. Mid LAD-1 lesion is 90% stenosed. Mid LAD-2 lesion is 80% stenosed.   VF cardiac arrest with prolonged resuscitative effort and ultimate restoration of paced rhythm following multiple shocks, amiodarone, and Levophed infusion.   Severe native CAD with diffuse severe proximal LAD stenoses of 70 to 80% with focal 90% stenosis between the first and second septal perforating artery with total occlusion of the first diagonal vessel and a flush and fill phenomena of the mid LAD secondary to competitive filling via the LIMA graft.   Old occlusion of the ramus immediate vessel and obtuse marginal vessel   Dominant RCA with previously noted  diffuse 90% mid stenosis which now has right to right collateralization and diffuse 80% distal stenosis.   Patent LIMA graft supplying the mid LAD.   Patent SVG supplying the first diagonal vessel.   Old occlusion of the vein graft to a ramus intermediate vessel.   Old occlusion of the vein graft which had supplied the distal RCA.   LVEDP 20 mmHg   RECOMMENDATION: Patient is admitted to the critical care service.  We will plan 2D echo Doppler study in a.m.  He has been seen by Dr. Rayann Heman in the emergency room during his prolonged resuscitative  efforts today.  Guideline directed medical therapy for CHF.  Anticipate ICD implantation per electrophysiology.  Coronary Diagrams   Diagnostic Dominance: Right    Intervention     Patient Profile     Ross [name] is a 65 y.o. male with known ischemic CM (EF 25%), prior CABG, permanent afib, and DM who is admitted with VF arrest.  Assessment & Plan    1: Status post VF arrest-patient had witnessed cardiac arrest, defibrillation x10 and CPR with ROSC.  He was intubated and transported to [facility].  He currently has a paced rhythm.  IV amiodarone has continued.  We will transition to p.o. amiodarone tomorrow.  He has been extubated and is hemodynamically stable.  He is off all  pressor agents.  Amazingly, he is neurologically intact so with only mild mild intermittent confusion.  2: Ischemic cardiomyopathy-EF in the 30% range in the past and currently 35 to 40% by 2D echo.  He was on guideline directed optimal medical therapy prior to admission followed by Dr. Tamala Julian.  Currently all meds are on hold.  These will be reinstituted depending on his clinical improvement in renal status.  Cardiac catheterization performed by Dr. Claiborne Billings on 05/31/2021 at the time of presentation showed a patent vein to ramus branch, patent LIMA to the LAD, occluded vein to the RCA with occluded native RCA and occluded vein to a diagonal branch.  Medical therapy was recommended for this.  There were no "culprit lesions".  We will start back on low-dose carvedilol today.  3: Atrial fibrillation-history of permanent A. fib on Eliquis oral anticoagulation as an outpatient.  Currently he appears to have gone back in A. fib and is V paced.  He is on IV heparin.  Will transition back to Eliquis after his skin necrosis has been evaluated by plastics.  4: Secondary AV block-history of permanent transvenous pacemaker placed in the past for this.  [employer] to interrogate.  Plan for CRT upgrade at some point in the  future according to Dr. Curt Bears although now given his skin necrosis with the need for plastic surgical intervention and underlying infection the plan is to ultimately send him home on a LifeVest and readdress pacemaker upgrade in the future.  5: Acute renal insufficiency-serum creatinine increased from 1.2- 5.7 probably related to ATN from his acute event.  He is making urine.  I notified the renal surgery service (Dr. Hollie Salk) to assist in his care.  Hopefully this will ultimately resolve.  6: Left upper extremity skin necrosis-probably related to IV infiltration of his norepinephrine.  Dr. Curt Bears has consulted plastic surgery for potential surgical intervention.     For questions or updates, please contact [facility] Please consult [email or web address] for contact info under        Signed, Quay Burow, MD  06/03/2021, 9:12 AM

## 2021-06-03 NOTE — Progress Notes (Signed)
OT Cancellation Note  Patient Details Name: Ross [name] MRN: [id number] DOB: 27-Mar-1956   Cancelled Treatment:    Reason Eval/Treat Not Completed: Patient not medically ready (LUE wound and per MD and RN not yet medically appropriate yet) Will follow-up for OT eval as appropriate  Layla Maw 06/03/2021, 8:14 AM

## 2021-06-03 NOTE — Progress Notes (Signed)
PROGRESS NOTE    Ross [name]  IWL:[id number] DOB: Feb 25, 1956 DOA: 05/31/2021 PCP: Orpah Melter, MD  Outpatient Specialists: [facility]    Brief Narrative:   Patient presented with VF cardiac arrest and prolonged resuscitative effort. Multiple shocks amiodarone, and levophed infusion. Emergent cath showed severe native CAD. Admitted to ICU, intubated, extubated 4/12. transferred to hospitalist service 6/13.   Assessment & Plan:   Active Problems:   Persistent atrial fibrillation (HCC)   Cardiac arrest (HCC)   Hx of CABG   Ischemic cardiomyopathy   S/P V fib arrest Cardiology following.  continue amiodarone  Ischemic cardiomyopathy s/p CABG HFrEF ef 35-40 possible ICD vs life vest this admission Cont asa, atorvastatin  Acute hypoxic respiratory failure Required intubation, now extubated. On 2L [place] as able  T2DM SSI, accuchecks, hypoglycemic protocol  Permanent A fib On eliquis as outpatient Cont amio Cont heparin gtt  Acute kidney injury Likely ATN 2/2 cardiac arrest. UOP poor Continue foley, NS @ 39 Nephrology on board, appreciate recs  Presumed aspiration pneumonia Currently afebrile, with resolving leukocytosis BC X2 pending, UC with no growth Cont unasyn  Intermittent altered mental status CT head pending  Hematoma Thrombocytopenia Left arm wound secondary to extravasation of norepinephrine Left anterior shin wound s/p attempted I/O access Wound consult nurse has evaluated  Plastic surgery consulted, appreciate recs  ??HIT, no plt drop < 50% (190>120) Monitor closely   DVT prophylaxis: heparin Code Status: full Family Communication: None at bedside  Level of care: ICU Status is: Inpatient  Remains inpatient appropriate because:Inpatient level of care appropriate due to severity of illness  Dispo: The patient is from: Home              Anticipated d/c is to: SNF              Patient currently is not medically stable to  d/c.   Difficult to place patient No        Consultants:  Cardiology, electrophysiology, nephrology  Procedures: LHC  Antimicrobials:  unasyn    Subjective: Denies any new complaints, wound on UE and LE with worsening blister. Noted some intermittent mental status  Objective: Vitals:   06/03/21 1635 06/03/21 1700 06/03/21 1716 06/03/21 1800  BP: 114/64  114/64   Pulse:  (!) 58 60 (!) 59  Resp:  20  15  Temp:      TempSrc:      SpO2:  100%  99%  Weight:      Height:        Intake/Output Summary (Last 24 hours) at 06/03/2021 1859 Last data filed at 06/03/2021 1800 Gross per 24 hour  Intake 2006.63 ml  Output 1205 ml  Net 801.63 ml   Filed Weights   05/31/21 2200  Weight: 109.5 kg    Examination: General: NAD, chronically ill appearing  Cardiovascular: S1, S2 present Respiratory: CTAB Abdomen: Soft, nontender, nondistended, bowel sounds present Musculoskeletal: LUE and LLE wound notedNo bilateral pedal edema noted Skin: As noted above Psychiatry: Normal mood     Data Reviewed: I have personally reviewed following labs and imaging studies  CBC: Recent Labs  Lab 06/01/21 0124 06/01/21 0303 06/02/21 0450 06/02/21 1607 06/03/21 0346  WBC  --  24.8* 14.2* 14.8* 13.4*  HGB 15.3 15.0 11.6* 12.5* 11.8*  HCT 45.0 44.9 35.8* 38.5* 36.4*  MCV  --  93.3 96.5 96.7 95.3  PLT  --  190 120* 129* 174*   Basic Metabolic Panel: Recent Labs  Lab 06/01/21 0124 06/01/21 0303 06/02/21 0450 06/02/21 1431 06/03/21 0346  NA 140 136 141 136 134*  K 3.9 3.6 3.8 4.5 4.5  CL  --  110 110 104 104  CO2  --  18* 18* 20* 20*  GLUCOSE  --  173* 95 137* 131*  BUN  --  34* 45* 53* 58*  CREATININE  --  2.67* 4.51* 5.06* 5.76*  CALCIUM  --  9.9 7.7* 8.8* 8.6*  MG  --  3.6*  --   --   --   PHOS  --  2.8  --   --   --    GFR: Estimated Creatinine Clearance: 16.5 mL/min (A) (by C-G formula based on SCr of 5.76 mg/dL (H)). Liver Function Tests: No results for input(s):  AST, ALT, ALKPHOS, BILITOT, PROT, ALBUMIN in the last 168 hours. No results for input(s): LIPASE, AMYLASE in the last 168 hours. No results for input(s): AMMONIA in the last 168 hours. Coagulation Profile: No results for input(s): INR, PROTIME in the last 168 hours. Cardiac Enzymes: No results for input(s): CKTOTAL, CKMB, CKMBINDEX, TROPONINI in the last 168 hours. BNP (last 3 results) No results for input(s): PROBNP in the last 8760 hours. HbA1C: Recent Labs    06/01/21 0303  HGBA1C 5.6   CBG: Recent Labs  Lab 06/02/21 2345 06/03/21 0324 06/03/21 0730 06/03/21 1137 06/03/21 1600  GLUCAP 88 105* 71 75 76   Lipid Profile: No results for input(s): CHOL, HDL, LDLCALC, TRIG, CHOLHDL, LDLDIRECT in the last 72 hours. Thyroid Function Tests: No results for input(s): TSH, T4TOTAL, FREET4, T3FREE, THYROIDAB in the last 72 hours. Anemia Panel: No results for input(s): VITAMINB12, FOLATE, FERRITIN, TIBC, IRON, RETICCTPCT in the last 72 hours. Urine analysis:    Component Value Date/Time   COLORURINE YELLOW 06/02/2021 1431   APPEARANCEUR CLOUDY (A) 06/02/2021 1431   LABSPEC 1.020 06/02/2021 1431   PHURINE 5.0 06/02/2021 1431   GLUCOSEU 150 (A) 06/02/2021 1431   HGBUR LARGE (A) 06/02/2021 1431   BILIRUBINUR NEGATIVE 06/02/2021 1431   KETONESUR NEGATIVE 06/02/2021 1431   PROTEINUR 100 (A) 06/02/2021 1431   NITRITE NEGATIVE 06/02/2021 1431   LEUKOCYTESUR LARGE (A) 06/02/2021 1431   Sepsis Labs: @LABRCNTIP (procalcitonin:4,lacticidven:4)  ) Recent Results (from the past 240 hour(s))  C Difficile Quick Screen w PCR reflex     Status: None   Collection Time: 06/01/21 10:22 AM   Specimen: STOOL  Result Value Ref Range Status   C Diff antigen NEGATIVE NEGATIVE Final   C Diff toxin NEGATIVE NEGATIVE Final   C Diff interpretation No C. difficile detected.  Final    Comment: Performed at [facility], [place] [address] [place], [place] [address]  MRSA Next Gen by PCR, Nasal      Status: None   Collection Time: 06/02/21  8:30 AM  Result Value Ref Range Status   MRSA by PCR Next Gen NOT DETECTED NOT DETECTED Final    Comment: (NOTE) The GeneXpert MRSA Assay (FDA approved for NASAL specimens only), is one component of a comprehensive MRSA colonization surveillance program. It is not intended to diagnose MRSA infection nor to guide or monitor treatment for MRSA infections. Test performance is not FDA approved in patients less than 36 years old. Performed at [facility], [place] [address], [place], [place] [address]   Culture, Urine     Status: None   Collection Time: 06/02/21  2:31 PM   Specimen: Urine, Catheterized  Result Value  Ref Range Status   Specimen Description URINE, CATHETERIZED  Final   Special Requests NONE  Final   Culture   Final    NO GROWTH Performed at [facility], [address] [place], [place] [address]    Report Status 06/03/2021 FINAL  Final  Culture, blood (routine x 2)     Status: None (Preliminary result)   Collection Time: 06/02/21  4:59 PM   Specimen: BLOOD RIGHT HAND  Result Value Ref Range Status   Specimen Description BLOOD RIGHT HAND  Final   Special Requests   Final    BOTTLES DRAWN AEROBIC ONLY Blood Culture results may not be optimal due to an inadequate volume of blood received in culture bottles   Culture   Final    NO GROWTH < 12 HOURS Performed at [facility], [place] [address], [place], [place] [address]    Report Status PENDING  Incomplete  Culture, blood (routine x 2)     Status: None (Preliminary result)   Collection Time: 06/02/21  5:08 PM   Specimen: BLOOD RIGHT HAND  Result Value Ref Range Status   Specimen Description BLOOD RIGHT HAND  Final   Special Requests   Final    BOTTLES DRAWN AEROBIC ONLY Blood Culture results may not be optimal due to an inadequate volume of blood received in culture bottles   Culture   Final    NO GROWTH < 12 HOURS Performed at [facility], [place]  [address] [place], [place] [address]    Report Status PENDING  Incomplete         Radiology Studies: No results found.      Scheduled Meds:  aspirin  81 mg Oral Daily   atorvastatin  80 mg Oral Daily   carvedilol  3.125 mg Oral BID WC   Chlorhexidine Gluconate Cloth  6 each Topical Daily   insulin aspart  0-9 Units Subcutaneous Q4H   mouth rinse  15 mL Mouth Rinse BID   polyethylene glycol  17 g Oral Daily   senna-docusate  1 tablet Oral BID   sodium chloride flush  10-40 mL Intracatheter Q12H   sodium chloride flush  3 mL Intravenous Q12H   Continuous Infusions:  sodium chloride     sodium chloride     sodium chloride 50 mL/hr at 06/03/21 1800   amiodarone 30 mg/hr (06/03/21 1800)   ampicillin-sulbactam (UNASYN) IV Stopped (06/03/21 1100)   heparin 1,100 Units/hr (06/03/21 1800)     LOS: 3 days     Alma Friendly, MD [facility]   If 7PM-7AM, please contact night-coverage 06/03/2021, 6:59 PM
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
## 2021-06-03 NOTE — Progress Notes (Signed)
RN spoke with Dr. Marlowe Sax in regards to patients Left Arm. She advised waiting to treat until wound is seen by a provider and asked to page day time [facility] to come assess the wound.

## 2021-06-03 NOTE — Progress Notes (Signed)
  Left forearm    Left leg Consulted plastic surgery Spoke with Dr. Marla Roe

## 2021-06-03 NOTE — Progress Notes (Signed)
RN called Cardiology (Dr. Renella Cunas) in regards to patients Left Forearm having enlarged blisters and spots of discoloration from previous infiltration. Dr. Renella Cunas gave verbal orders for topical nitroglycerine. After RN called pharmacy [facility]), and they are aware of the patients condition but we felt we should await for orders after provider bedside assessment. At this time we are waiting for bedside assessment instructions for wound care.   Pt has a palpable Left radial pulse at this time.

## 2021-06-03 NOTE — Progress Notes (Signed)
Progress Note  Patient Name: Ross [name] Date of Encounter: 06/03/2021  [facility] Cardiologist: Sinclair Grooms, MD   Subjective   Converted back to atrial fibrillation.  No major complaints other than discomfort of his left arm, left leg, and chest.  Remains confused.  Inpatient Medications    Scheduled Meds:  aspirin  81 mg Oral Daily   atorvastatin  80 mg Oral Daily   Chlorhexidine Gluconate Cloth  6 each Topical Daily   insulin aspart  0-9 Units Subcutaneous Q4H   mouth rinse  15 mL Mouth Rinse BID   sodium chloride flush  10-40 mL Intracatheter Q12H   sodium chloride flush  3 mL Intravenous Q12H   Continuous Infusions:  sodium chloride     sodium chloride     sodium chloride 50 mL/hr at 06/03/21 0600   amiodarone 30 mg/hr (06/03/21 0600)   ampicillin-sulbactam (UNASYN) IV Stopped (06/02/21 4008)   heparin 1,100 Units/hr (06/03/21 0600)   PRN Meds: sodium chloride, acetaminophen, ALPRAZolam, ipratropium-albuterol, ondansetron (ZOFRAN) IV, oxyCODONE-acetaminophen, sodium chloride flush, sodium chloride flush   Vital Signs    Vitals:   06/03/21 0500 06/03/21 0600 06/03/21 0700 06/03/21 0735  BP: (!) 137/59 (!) 111/53 (!) 169/59   Pulse: (!) 59 (!) 59 61   Resp: 17 17 17    Temp:    98.4 F (36.9 C)  TempSrc:    Oral  SpO2: 90% 97% 99%   Weight:      Height:        Intake/Output Summary (Last 24 hours) at 06/03/2021 0816 Last data filed at 06/03/2021 0600 Gross per 24 hour  Intake 2016.29 ml  Output 580 ml  Net 1436.29 ml    Last 3 Weights 05/31/2021 05/29/2021 04/14/2021  Weight (lbs) 241 lb 6.5 oz 238 lb 240 lb 3.2 oz  Weight (kg) 109.5 kg 107.956 kg 108.954 kg      Telemetry    Atrial fibrillation with intermittent ventricular pacing-personally reviewed  ECG    No new EKGs - Personally Reviewed  Physical Exam   GEN: Well nourished, well developed, in no acute distress  HEENT: normal  Neck: no JVD, carotid bruits, or masses Cardiac:  RRR; no murmurs, rubs, or gallops,no edema  Respiratory:  clear to auscultation bilaterally, normal work of breathing GI: soft, nontender, nondistended, + BS MS: no deformity or atrophy  Skin: Blackened area with bruising along the left forearm, significant bruising along the left leg into the thigh Neuro:  Strength and sensation are intact Psych: euthymic mood, full affect   Labs    High Sensitivity Troponin:  No results for input(s): TROPONINIHS in the last 720 hours.    Chemistry Recent Labs  Lab 06/02/21 0450 06/02/21 1431 06/03/21 0346  NA 141 136 134*  K 3.8 4.5 4.5  CL 110 104 104  CO2 18* 20* 20*  GLUCOSE 95 137* 131*  BUN 45* 53* 58*  CREATININE 4.51* 5.06* 5.76*  CALCIUM 7.7* 8.8* 8.6*  GFRNONAA 14* 12* 10*  ANIONGAP 13 12 10       Hematology Recent Labs  Lab 06/02/21 0450 06/02/21 1607 06/03/21 0346  WBC 14.2* 14.8* 13.4*  RBC 3.71* 3.98* 3.82*  HGB 11.6* 12.5* 11.8*  HCT 35.8* 38.5* 36.4*  MCV 96.5 96.7 95.3  MCH 31.3 31.4 30.9  MCHC 32.4 32.5 32.4  RDW 13.3 13.3 13.3  PLT 120* 129* 114*     BNPNo results for input(s): BNP, PROBNP in the last 168 hours.  DDimer No results for input(s): DDIMER in the last 168 hours.   Radiology     DG CHEST PORT 1 VIEW Result Date: 06/01/2021 CLINICAL DATA:  PICC line placement EXAM: PORTABLE CHEST 1 VIEW COMPARISON:  One day prior FINDINGS: Endotracheal tube terminates 4.8 cm above carina. Nasogastric tube extends beyond the inferior aspect of the film. Single lead pacer. Right PICC line has been placed in the interval. Terminates at either the superior caval/atrial junction or high right atrium. Midline trachea. Borderline cardiomegaly, accentuated by AP portable technique. No pleural effusion or pneumothorax. No congestive failure. Clear lungs. IMPRESSION: Right PICC line terminating at the superior caval/atrial junction or high right atrium. No pneumothorax or other acute complication. Electronically Signed    By: Abigail Miyamoto M.D.   On: 06/01/2021 07:33    Cardiac Studies    06/01/21: TTE IMPRESSIONS   1. Left ventricular ejection fraction, by estimation, is 35 to 40%. The  left ventricle has moderately decreased function. The left ventricle has  no regional wall motion abnormalities. There is severe concentric left  ventricular hypertrophy. Left  ventricular diastolic parameters are indeterminate. Elevated left  ventricular end-diastolic pressure. There is akinesis of the left  ventricular, mid anteroseptal wall. There is akinesis of the left  ventricular, apical septal wall, inferior wall and  anterior wall. There is akinesis of the left ventricular, apical segment.   2. Right ventricular systolic function was not well visualized. The right  ventricular size is mildly enlarged. Tricuspid regurgitation signal is  inadequate for assessing PA pressure.   3. Right atrial size was severely dilated.   4. The mitral valve is normal in structure. Trivial mitral valve  regurgitation. No evidence of mitral stenosis.   5. The aortic valve is tricuspid. Aortic valve regurgitation is not  visualized. Mild aortic valve sclerosis is present, with no evidence of  aortic valve stenosis.   6. Aortic dilatation noted. There is mild dilatation of the aortic root,  measuring 41 mm.   7. The inferior vena cava is dilated in size with >50% respiratory  variability, suggesting right atrial pressure of 8 mmHg.    05/31/21; LHC Prox RCA to Mid RCA lesion is 99% stenosed. Dist RCA lesion is 80% stenosed. Ramus lesion is 100% stenosed. Origin to Prox Graft lesion is 100% stenosed. Origin lesion is 100% stenosed. Prox LAD lesion is 70% stenosed. Prox LAD to Mid LAD lesion is 70% stenosed. Mid LAD-1 lesion is 90% stenosed. Mid LAD-2 lesion is 80% stenosed.   VF cardiac arrest with prolonged resuscitative effort and ultimate restoration of paced rhythm following multiple shocks, amiodarone, and Levophed  infusion.   Severe native CAD with diffuse severe proximal LAD stenoses of 70 to 80% with focal 90% stenosis between the first and second septal perforating artery with total occlusion of the first diagonal vessel and a flush and fill phenomena of the mid LAD secondary to competitive filling via the LIMA graft.   Old occlusion of the ramus immediate vessel and obtuse marginal vessel   Dominant RCA with previously noted  diffuse 90% mid stenosis which now has right to right collateralization and diffuse 80% distal stenosis.   Patent LIMA graft supplying the mid LAD.   Patent SVG supplying the first diagonal vessel.   Old occlusion of the vein graft to a ramus intermediate vessel.   Old occlusion of the vein graft which had supplied the distal RCA.   LVEDP 20 mmHg   RECOMMENDATION: Patient is admitted to  the critical care service.  We Desera Graffeo plan 2D echo Doppler study in a.m.  He has been seen by Dr. Rayann Heman in the emergency room during his prolonged resuscitative efforts today.  Guideline directed medical therapy for CHF.  Anticipate ICD implantation per electrophysiology.     02/27/21: TTE IMPRESSIONS   1. There is diffuse hypokinesis with paradoxical septal motion and apical  aneurysm with LVEF 25-30%.   2. Left ventricular ejection fraction, by estimation, is 25 to 30%. The  left ventricle has severely decreased function. The left ventricle  demonstrates global hypokinesis. The left ventricular internal cavity size  was moderately dilated. There is  moderate concentric left ventricular hypertrophy. Left ventricular  diastolic function could not be evaluated.   3. Right ventricular systolic function is mildly reduced. The right  ventricular size is mildly enlarged. There is normal pulmonary artery  systolic pressure.   4. Left atrial size was moderately dilated.   5. Right atrial size was mildly dilated.   6. The mitral valve is normal in structure. Mild mitral valve   regurgitation. No evidence of mitral stenosis.   7. The aortic valve is normal in structure. Aortic valve regurgitation is  not visualized. No aortic stenosis is present.   8. Aortic dilatation noted. There is mild dilatation of the ascending  aorta, measuring 41 mm.   9. The inferior vena cava is normal in size with greater than 50%  respiratory variability, suggesting right atrial pressure of 3 mmHg.   Patient Profile     65 y.o. male with PMHx of CAD (CABG), ICM (new), permanent AFib, symptomatic bradycardia with PPM, DM, obesity  Assessment & Plan    1.  S/p VF arrest Required multiple defibrillations.  He is currently awake and alert.  Hanh Kertesz need device upgrade.  Unfortunately with his ongoing issues of his left upper extremity wound and renal failure, he may benefit more from LifeVest and outpatient upgrade due to infectious risk and renal failure.  Would continue IV amiodarone for the next 2 days.  Would switch to p.o. amiodarone on Friday.  2. Respiratoy failure Continue per primary team  3. Shock Currently off pressors and extubated.  AAO x3, though no independent knowledge of events and having trouble with short term memory  Continue amiodarone gtt today        4.  Ischemic cardiomyopathy status post CABG: No targets for intervention.    5.  Permanent atrial fibrillation: Currently on amiodarone.  Has converted back to atrial fibrillation after short time in sinus rhythm likely due to multiple defibrillator shocks.  Plan for rate control.   6.  Acute renal failure: No obvious volume overload.  Is continuing to make urine.  Likely due to ATN.  Nephrology has been consulted.  7.  Left upper extremity wound: Had infiltration of norepinephrine.  Has bruising and a potential necrotic area.  Wound care has been consulted.  We Evanna Washinton call plastic surgery for recommendations.   For questions or updates, please contact [place] Please consult [email or web address] for contact  info under        Signed, Rosi Secrist Meredith Leeds, MD  06/03/2021, 8:16 AM

## 2021-06-03 NOTE — Progress Notes (Signed)
Subjective:  Patient ID: Ross [name], male    DOB: August 30, 1956,  MRN: [id number]  Chief Complaint  Patient presents with   Nail Problem    Right hallux nail is split     65 y.o. male presents for wound care.  Patient presents with new complaint of left ankle medial wound.  Patient states is been going for quite some time.  Patient states that he has a history of heart failure and has been developing this ulceration for quite some time.  Has been keeping it covered.  He does have a little split nail of the hallux with mild hematoma for which there is no acute intervention the nail itself is pretty well adhered.  He states that there is mild pain associated with it.  He denies any other acute complaints.  He would like to discuss treatment options for the ulceration.   Review of Systems: Negative except as noted in the HPI. Denies N/V/F/Ch.  Past Medical History:  Diagnosis Date   Anxiety    Arthritis    "all over my body"   Atrial fibrillation ([place])    rate control strategy; LA large   Cardiac arrest ([place]) 05/31/2021   Carpal tunnel syndrome    Cerebral aneurysm dx'd 2/42/3536   Complication of anesthesia    "I' was told that I'm a very high risk to be put to sleep; I may not come out of it" (01/16/2016)   COPD (chronic obstructive pulmonary disease) ([place])    Coronary artery disease    a. s/p CABG;  b. Myoview (7/13):  apical anterior ischemia; c. LHC (08/2012): dLM 60%, mid LAD 90%, pCFX 40%, OM1 occluded, RCA 80-90%, distal RCA 60-70%, SVG-Dx patent, SVG-OM1 patent, LIMA-LAD patent, SVG-PDA occluded, EF 55%. => RCA dsz too long and would req multiple stents; pt refused CABG => med Rx.   Depression    DJD (degenerative joint disease)    Dyslipidemia    Dysrhythmia    Hx of echocardiogram    a. Echocardiogram (05/2013): Mild LVH, EF 54.8%, moderate LAE, mild aortic root dilatation, trace pulmonic regurgitation   Hypertension    Obesity    Peripheral neuropathy    Presence of  permanent cardiac pacemaker    Type II diabetes mellitus ([place])    "haven't taken RX for 7-8 years" (01/16/2016)   Ventricular fibrillation ([place]) 05/31/2021   No current facility-administered medications for this visit. No current outpatient medications on file.  Facility-Administered Medications Ordered in Other Visits:    0.9 %  sodium chloride infusion, 250 mL, Intravenous, PRN, Shelva Majestic A, MD   0.9 %  sodium chloride infusion, 250 mL, Intravenous, Continuous, Stretch, Marily Lente, MD   0.9 %  sodium chloride infusion, , Intravenous, Continuous, Wouk, Ailene Rud, MD, Last Rate: 50 mL/hr at 06/03/21 0900, Infusion Verify at 06/03/21 0900   acetaminophen (TYLENOL) tablet 650 mg, 650 mg, Oral, Q4H PRN, Troy Sine, MD   ALPRAZolam Duanne Moron) tablet 0.25 mg, 0.25 mg, Oral, TID PRN, Icard, Bradley L, DO, 0.25 mg at 06/01/21 2224   amiodarone (NEXTERONE PREMIX) 360-4.14 MG/200ML-% (1.8 mg/mL) IV infusion, 30 mg/hr, Intravenous, Continuous, Stretch, Marily Lente, MD, Last Rate: 16.67 mL/hr at 06/03/21 0941, 30 mg/hr at 06/03/21 0941   ampicillin-sulbactam (UNASYN) 1.5 g in sodium chloride 0.9 % 100 mL IVPB, 1.5 g, Intravenous, Q12H, Lorretta Harp, MD, Last Rate: 200 mL/hr at 06/03/21 1029, 1.5 g at 06/03/21 1029   aspirin chewable tablet 81 mg, 81 mg, Oral,  Daily, Troy Sine, MD, 81 mg at 06/03/21 1030   atorvastatin (LIPITOR) tablet 80 mg, 80 mg, Oral, Daily, Troy Sine, MD, 80 mg at 06/03/21 1030   carvedilol (COREG) tablet 3.125 mg, 3.125 mg, Oral, BID WC, Lorretta Harp, MD, 3.125 mg at 06/03/21 1031   Chlorhexidine Gluconate Cloth 2 % PADS 6 each, 6 each, Topical, Daily, Stretch, Marily Lente, MD, 6 each at 06/02/21 1614   heparin ADULT infusion 100 units/mL (25000 units/282mL), 1,100 Units/hr, Intravenous, Continuous, Bryk, Veronda P, RPH, Last Rate: 11 mL/hr at 06/03/21 0900, 1,100 Units/hr at 06/03/21 0900   insulin aspart (novoLOG) injection 0-9 Units, 0-9 Units, Subcutaneous,  Q4H, Stretch, Marily Lente, MD, 1 Units at 06/01/21 0800   ipratropium-albuterol (DUONEB) 0.5-2.5 (3) MG/3ML nebulizer solution 3 mL, 3 mL, Nebulization, Q6H PRN, Stretch, Marily Lente, MD, 3 mL at 06/01/21 2259   MEDLINE mouth rinse, 15 mL, Mouth Rinse, BID, Wouk, Ailene Rud, MD, 15 mL at 06/02/21 2112   ondansetron [facility]) injection 4 mg, 4 mg, Intravenous, Q6H PRN, Troy Sine, MD, 4 mg at 05/31/21 2320   oxyCODONE-acetaminophen (PERCOCET/ROXICET) 5-325 MG per tablet 1-2 tablet, 1-2 tablet, Oral, Q3H PRN, Icard, Bradley L, DO, 1 tablet at 06/03/21 0831   polyethylene glycol (MIRALAX / GLYCOLAX) packet 17 g, 17 g, Oral, Daily, Alma Friendly, MD, 17 g at 06/03/21 1155   senna-docusate (Senokot-S) tablet 1 tablet, 1 tablet, Oral, BID, Alma Friendly, MD, 1 tablet at 06/03/21 1155   sodium chloride flush (NS) 0.9 % injection 10-40 mL, 10-40 mL, Intracatheter, Q12H, Allred, James, MD, 10 mL at 06/02/21 2112   sodium chloride flush (NS) 0.9 % injection 10-40 mL, 10-40 mL, Intracatheter, PRN, Allred, James, MD   sodium chloride flush (NS) 0.9 % injection 3 mL, 3 mL, Intravenous, Q12H, Troy Sine, MD, 3 mL at 06/02/21 2112   sodium chloride flush (NS) 0.9 % injection 3 mL, 3 mL, Intravenous, PRN, Troy Sine, MD  Social History   Tobacco Use  Smoking Status Every Day   Packs/day: 1.50   Years: 45.00   Pack years: 67.50   Types: Cigarettes  Smokeless Tobacco Never    No Known Allergies Objective:  There were no vitals filed for this visit. There is no height or weight on file to calculate BMI. Constitutional Well developed. Well nourished.  Vascular Dorsalis pedis pulses palpable bilaterally. Posterior tibial pulses palpable bilaterally. Capillary refill normal to all digits.  No cyanosis or clubbing noted. Pedal hair growth normal.  Neurologic Normal speech. Oriented to person, place, and time. Protective sensation absent  Dermatologic Wound Location: Left ankle  medial Limited to the breakdown of the skin Wound Base: Mixed Granular/Fibrotic Peri-wound: Calloused Exudate: Scant/small amount Serosanguinous exudate Wound Measurements: -See below  Orthopedic: No pain to palpation either foot.   Radiographs: None Assessment:   1. Ankle ulcer, left, limited to breakdown of skin [facility])    Plan:  Patient was evaluated and treated and all questions answered.  Ulcer left ankle medial ulceration limited to the breakdown of the skin -Debridement as below. -Dressed with triple antibiotic, DSD. -Continue off-loading with surgical shoe.  Procedure: Excisional Debridement of Wound Tool: Sharp chisel blade/tissue nipper Rationale: Removal of non-viable soft tissue from the wound to promote healing.  Anesthesia: none Pre-Debridement Wound Measurements: 0.2 cm x 0.2 cm x 0.1 cm  Post-Debridement Wound Measurements: 0.3 cm x there is 0.3 cm x 0.1 cm  Type of Debridement: Sharp Excisional Tissue  Removed: Non-viable soft tissue Blood loss: Minimal (<50cc) Depth of Debridement: subcutaneous tissue. Technique: Sharp excisional debridement to bleeding, viable wound base.  Wound Progress: This is my initial evaluation I will continue monitor the progression of it. Site healing conversation 7 Dressing: Dry, sterile, compression dressing. Disposition: Patient tolerated procedure well. Patient to return in 1 week for follow-up.  No follow-ups on file.
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
## 2021-06-04 DIAGNOSIS — I255 Ischemic cardiomyopathy: Secondary | ICD-10-CM | POA: Diagnosis not present

## 2021-06-04 DIAGNOSIS — I469 Cardiac arrest, cause unspecified: Secondary | ICD-10-CM | POA: Diagnosis not present

## 2021-06-04 DIAGNOSIS — I4891 Unspecified atrial fibrillation: Secondary | ICD-10-CM | POA: Diagnosis not present

## 2021-06-04 DIAGNOSIS — I96 Gangrene, not elsewhere classified: Secondary | ICD-10-CM

## 2021-06-04 DIAGNOSIS — Z951 Presence of aortocoronary bypass graft: Secondary | ICD-10-CM | POA: Diagnosis not present

## 2021-06-04 DIAGNOSIS — I4819 Other persistent atrial fibrillation: Secondary | ICD-10-CM | POA: Diagnosis not present

## 2021-06-04 LAB — BASIC METABOLIC PANEL
Anion gap: 10 (ref 5–15)
BUN: 64 mg/dL — ABNORMAL HIGH (ref 8–23)
CO2: 19 mmol/L — ABNORMAL LOW (ref 22–32)
Calcium: 8.5 mg/dL — ABNORMAL LOW (ref 8.9–10.3)
Chloride: 105 mmol/L (ref 98–111)
Creatinine, Ser: 6.22 mg/dL — ABNORMAL HIGH (ref 0.61–1.24)
GFR, Estimated: 9 mL/min — ABNORMAL LOW (ref >60)
Glucose, Bld: 161 mg/dL — ABNORMAL HIGH (ref 70–99)
Potassium: 4.3 mmol/L (ref 3.5–5.1)
Sodium: 134 mmol/L — ABNORMAL LOW (ref 135–145)

## 2021-06-04 LAB — CBC
HCT: 33.2 % — ABNORMAL LOW (ref 39.0–52.0)
Hemoglobin: 11 g/dL — ABNORMAL LOW (ref 13.0–17.0)
MCH: 31.1 pg (ref 26.0–34.0)
MCHC: 33.1 g/dL (ref 30.0–36.0)
MCV: 93.8 fL (ref 80.0–100.0)
Platelets: 111 10*3/uL — ABNORMAL LOW (ref 150–400)
RBC: 3.54 MIL/uL — ABNORMAL LOW (ref 4.22–5.81)
RDW: 13.3 % (ref 11.5–15.5)
WBC: 9.9 10*3/uL (ref 4.0–10.5)
nRBC: 0 % (ref 0.0–0.2)

## 2021-06-04 LAB — GLUCOSE, CAPILLARY
Glucose-Capillary: 98 mg/dL (ref 70–99)
Glucose-Capillary: 128 mg/dL — ABNORMAL HIGH (ref 70–99)
Glucose-Capillary: 89 mg/dL (ref 70–99)
Glucose-Capillary: 100 mg/dL — ABNORMAL HIGH (ref 70–99)
Glucose-Capillary: 100 mg/dL — ABNORMAL HIGH (ref 70–99)

## 2021-06-04 LAB — SARS CORONAVIRUS 2 (TAT 6-24 HRS): SARS Coronavirus 2: NEGATIVE

## 2021-06-04 LAB — APTT: aPTT: 55 seconds — ABNORMAL HIGH (ref 24–36)

## 2021-06-04 LAB — HEPARIN LEVEL (UNFRACTIONATED): Heparin Unfractionated: >1.1 IU/mL — ABNORMAL HIGH (ref 0.30–0.70)

## 2021-06-04 MED ORDER — AMIODARONE HCL 200 MG PO TABS
400.0000 mg | ORAL_TABLET | Freq: Two times a day (BID) | ORAL | Status: DC
  Administered 2021-06-04 – 2021-06-12 (×17): 400 mg via ORAL
  Filled 2021-06-04 (×17): qty 2

## 2021-06-04 NOTE — Progress Notes (Signed)
Patient arrive to [facility] from [facility], while getting settle in patient  started to yelled that his penis are was very painful. RN contacted Dr Hollie Salk about d/c foley and per MD okay to d/c.

## 2021-06-04 NOTE — Evaluation (Signed)
Occupational Therapy Evaluation Patient Details Name: Ross [name] MRN: [id number] DOB: 01-Sep-1956 Today's Date: 06/04/2021    History of Present Illness 65 yo admitted 6/11 after Vfib cardiac arrest at home with greater than an hour resuscitation and 10+ shocks, cath demonstrating severe CAD. Extubated 6/12. L UE/LE skin necrosis- wounds dressed by plastic surgery. PMhx: cardiomyopathy, CABG, CHF, DM, HTN, PPM, Afib, obesity, neuropathy.   Clinical Impression   PTA patient independent with ADLs, IADLs and driving. Admitted for above and presenting with problem list below, including pain, impaired balance, decreased activity tolerance, impaired cognition, L UE edema, and generalized weakness.  He currently requires mod assist +2 for bed mobility and transfers, up to total assist for LB ADLs and mod assist for UB ADLs.  He is disoriented to situation and time, requires increased time for problem solving, multimodal cueing for following simple commands.  Believe he will benefit from continued OT services while admitted and after dc at CIR level to optimize independence and return to PLOF.    Follow Up Recommendations  CIR;Supervision/Assistance - 24 hour    Equipment Recommendations  Other (comment) (TBD at next venue of care)    Recommendations for Other Services Rehab consult;Speech consult     Precautions / Restrictions Precautions Precautions: Fall Restrictions Weight Bearing Restrictions: No      Mobility Bed Mobility Overal bed mobility: Needs Assistance Bed Mobility: Supine to Sit;Sit to Supine     Supine to sit: Mod assist;+2 for physical assistance Sit to supine: Mod assist;+2 for physical assistance   General bed mobility comments: cues for technique, sequencing, truncal assist up /forward, stability during pivot and scooting assist.    Transfers Overall transfer level: Needs assistance Equipment used: Rolling walker (2 wheeled) Transfers: Sit to/from Stand Sit  to Stand: Mod assist;+2 physical assistance;+2 safety/equipment         General transfer comment: cues for hand placement, forward and boost assist.    Balance Overall balance assessment: Needs assistance Sitting-balance support: Single extremity supported;No upper extremity supported;Feet supported Sitting balance-Leahy Scale: Fair     Standing balance support: Bilateral upper extremity supported;No upper extremity supported Standing balance-Leahy Scale: Poor Standing balance comment: reliant on AD or external support.                           ADL either performed or assessed with clinical judgement   ADL Overall ADL's : Needs assistance/impaired     Grooming: Minimal assistance;Sitting   Upper Body Bathing: Moderate assistance;Sitting   Lower Body Bathing: Maximal assistance;+2 for physical assistance;+2 for safety/equipment;Sit to/from stand   Upper Body Dressing : Moderate assistance;Sitting   Lower Body Dressing: Total assistance;+2 for physical assistance;+2 for safety/equipment;Sit to/from stand   Toilet Transfer: Minimal assistance;+2 for physical assistance;+2 for safety/equipment Toilet Transfer Details (indicate cue type and reason): simulated side stepping towards HOB         Functional mobility during ADLs: Moderate assistance;+2 for physical assistance;+2 for safety/equipment;Rolling walker;Cueing for sequencing;Cueing for safety General ADL Comments: pt limited by cognition, weakness, balance, pain     Vision Baseline Vision/History: Wears glasses Wears Glasses: At all times Patient Visual Report: No change from baseline (reports doesnt wear glasses often)       Perception     Praxis      Pertinent Vitals/Pain Pain Assessment: Faces Faces Pain Scale: Hurts even more Pain Location: L knee, chest from CPR, catheter Pain Descriptors / Indicators: Aching;Burning;Discomfort;Grimacing;Guarding Pain Intervention(s): Limited  activity within  patient's tolerance;Monitored during session;Repositioned     Hand Dominance Right   Extremity/Trunk Assessment Upper Extremity Assessment Upper Extremity Assessment: Generalized weakness;LUE deficits/detail LUE Deficits / Details: L UE edema, forearm wrapped LUE Coordination: decreased fine motor;decreased gross motor   Lower Extremity Assessment Lower Extremity Assessment: Defer to PT evaluation       Communication Communication Communication: No difficulties   Cognition Arousal/Alertness: Awake/alert Behavior During Therapy: WFL for tasks assessed/performed;Agitated Overall Cognitive Status: Impaired/Different from baseline Area of Impairment: Orientation;Attention;Memory;Following commands;Awareness;Problem solving;Safety/judgement                 Orientation Level: Situation;Time Current Attention Level: Focused;Sustained Memory: Decreased short-term memory Following Commands: Follows one step commands with increased time;Follows one step commands inconsistently Safety/Judgement: Decreased awareness of safety;Decreased awareness of deficits Awareness: Intellectual Problem Solving: Slow processing;Decreased initiation;Difficulty sequencing;Requires verbal cues;Requires tactile cues General Comments: patient with decreased awareness of situation and time but becomes agitatied easily when questioned, voicing "I'm just waking up, I am in a fog".  Pt following 1 step commands but requires increased time and mulitmodal cueing.   General Comments  VSS on RA    Exercises     Shoulder Instructions      Home Living Family/patient expects to be discharged to:: Private residence Living Arrangements: Alone Available Help at Discharge: Family Type of Home: House Home Access: Stairs to enter Technical brewer of Steps: 4   Home Layout: One level     Bathroom Shower/Tub: Teacher, early years/pre: Standard     Home Equipment: None          Prior  Functioning/Environment Level of Independence: Needs assistance;Independent  Gait / Transfers Assistance Needed: independent ADL's / Homemaking Assistance Needed: independent ADLs, IAdls and driving            OT Problem List: Decreased strength;Decreased activity tolerance;Impaired balance (sitting and/or standing);Decreased coordination;Decreased cognition;Decreased knowledge of use of DME or AE;Decreased safety awareness;Decreased knowledge of precautions;Cardiopulmonary status limiting activity;Pain;Obesity      OT Treatment/Interventions: Self-care/ADL training;Therapeutic exercise;Energy conservation;DME and/or AE instruction;Therapeutic activities;Cognitive remediation/compensation;Patient/family education;Balance training    OT Goals(Current goals can be found in the care plan section) Acute Rehab OT Goals Patient Stated Goal: less pain OT Goal Formulation: With patient Time For Goal Achievement: 06/18/21 Potential to Achieve Goals: Good  OT Frequency: Min 2X/week   Barriers to D/C:            Co-evaluation PT/OT/SLP Co-Evaluation/Treatment: Yes Reason for Co-Treatment: For patient/therapist safety;To address functional/ADL transfers PT goals addressed during session: Mobility/safety with mobility OT goals addressed during session: ADL's and self-care      AM-PAC OT "6 Clicks" Daily Activity     Outcome Measure Help from another person eating meals?: A Little Help from another person taking care of personal grooming?: A Little Help from another person toileting, which includes using toliet, bedpan, or urinal?: Total Help from another person bathing (including washing, rinsing, drying)?: A Lot Help from another person to put on and taking off regular upper body clothing?: A Lot Help from another person to put on and taking off regular lower body clothing?: Total 6 Click Score: 12   End of Session Equipment Utilized During Treatment: Rolling walker Nurse  Communication: Mobility status;Other (comment) (pain from cath)  Activity Tolerance: Patient tolerated treatment well Patient left: in bed;with call bell/phone within reach;with bed alarm set;with family/visitor present  OT Visit Diagnosis: Other abnormalities of gait and mobility (R26.89);Muscle weakness (generalized) (M62.81);Other symptoms and  signs involving cognitive function;Pain Pain - Right/Left: Left Pain - part of body: Knee (cath, chest from CPR)                Time: 4765-4650 OT Time Calculation (min): 35 min Charges:  OT General Charges $OT Visit: 1 Visit OT Evaluation $OT Eval Moderate Complexity: 1 Mod  Jolaine Artist, OT Acute Rehabilitation Services Pager [phone] Office [phone]   Delight Stare 06/04/2021, 1:32 PM

## 2021-06-04 NOTE — Progress Notes (Signed)
 [facility] Progress Note   Assessment/ Plan:   AKI: most certainly ATN with prolonged downtime and contrast admin as well. No K/ CO2 or volume issues.  Cr from 1.12--> 4.5-->5.76 06/03/21--> 6.22 today.  Will require close monitoring for need for dialytic intervention.   - UOP picking up and rate of rise of Cr decreasing- looks like nascent recovery - volume status OK today- would hold on diuretics as I anticipate autodiuresis - no indication for dialysis at present and hopefully we can get by without it - Hold all nephrotoxins including entresto.    Out of hospital cardiac arrest: prolonged downtime.  Cardiology and EP following.  Would ideally do ICD but L arm pressor injury precludes safe insertion now Systolic CHF: EF 25% L shin and L forearm blistering- from peripheral admin of pressors.  Local wound care, plastics has seen. Dispo: remains in ICU    Subjective:    UOP picking up yesterday, 1.8L.  Cr up a little again today but rate of rise decreasing- hoping for recovery.  Plastics saw yesterday for blistering from pressor extravasation- wound care and expectant management for now.  Sitting up eating fruit plate, no issues today.     Objective:   BP 135/68   Pulse (!) 59   Temp 99.5 F (37.5 C) (Oral)   Resp (!) 25   Ht 6' 1.5" (1.867 m)   Wt 109.5 kg   SpO2 97%   BMI 31.42 kg/m   Physical Exam: GEN nad, sitting in bed HEENT EOMI PERRL NECK no overt JVD PULM clear CV RRR ABD soft EXT trace LE edema NEURO AAO x , cognitive slowing but no gross motor deficits SKIN: large area 5x 10 cm l shin with erythema and blistering, dressed with iodoform gauze.  L forearm with 4 x 8 ish area of blistering and erythema--> these are dressed today  Labs: BMET Recent Labs  Lab 06/01/21 0124 06/01/21 0303 06/02/21 0450 06/02/21 1431 06/03/21 0346 06/04/21 0503  NA 140 136 141 136 134* 134*  K 3.9 3.6 3.8 4.5 4.5 4.3  CL  --  110 110 104 104 105  CO2  --  18*  18* 20* 20* 19*  GLUCOSE  --  173* 95 137* 131* 161*  BUN  --  34* 45* 53* 58* 64*  CREATININE  --  2.67* 4.51* 5.06* 5.76* 6.22*  CALCIUM  --  9.9 7.7* 8.8* 8.6* 8.5*  PHOS  --  2.8  --   --   --   --    CBC Recent Labs  Lab 06/02/21 0450 06/02/21 1607 06/03/21 0346 06/04/21 0503  WBC 14.2* 14.8* 13.4* 9.9  HGB 11.6* 12.5* 11.8* 11.0*  HCT 35.8* 38.5* 36.4* 33.2*  MCV 96.5 96.7 95.3 93.8  PLT 120* 129* 114* 111*      Medications:     amiodarone  400 mg Oral BID   aspirin  81 mg Oral Daily   atorvastatin  80 mg Oral Daily   carvedilol  3.125 mg Oral BID WC   Chlorhexidine Gluconate Cloth  6 each Topical Daily   insulin aspart  0-9 Units Subcutaneous Q4H   mouth rinse  15 mL Mouth Rinse BID   polyethylene glycol  17 g Oral Daily   senna-docusate  1 tablet Oral BID   sodium chloride flush  10-40 mL Intracatheter Q12H   sodium chloride flush  3 mL Intravenous Q12H    Madelon Lips, MD 06/04/2021, 9:54 AM

## 2021-06-04 NOTE — Progress Notes (Signed)
PROGRESS NOTE    Ross [name]  QQV:[id number] DOB: 1956/05/12 DOA: 05/31/2021 PCP: Orpah Melter, MD  Outpatient Specialists: [facility]    Brief Narrative:   Patient presented with VF cardiac arrest and prolonged resuscitative effort. Multiple shocks amiodarone, and levophed infusion. Emergent cath showed severe native CAD. Admitted to ICU, intubated, extubated 4/12. transferred to hospitalist service 6/13.   Assessment & Plan:   Active Problems:   Persistent atrial fibrillation (HCC)   Cardiac arrest (HCC)   Hx of CABG   Ischemic cardiomyopathy   Ischemic cardiomyopathy s/p CABG HFrEF S/p V. fib arrest ef 35-40 Cardiology and EP following Due to worsening renal function, patient will require LifeVest prior to discharge and will follow up with EP clinic for possible ICD upgrade Cont asa, atorvastatin, amiodarone  Acute hypoxic respiratory failure Required intubation, now extubated Weaned off to RA  T2DM SSI, accuchecks, hypoglycemic protocol  Permanent A fib On eliquis as outpatient Cont amio Cont heparin gtt  Acute kidney injury Cr worsening Likely ATN 2/2 cardiac arrest Nephrology on board, appreciate recs, monitoring closely, may need HD  Presumed aspiration pneumonia Currently afebrile, with resolved leukocytosis BC X2 NGTD, UC with no growth Cont unasyn  Intermittent altered mental status CT head unremarkable  Hematoma Thrombocytopenia Left arm wound secondary to extravasation of norepinephrine Left anterior shin wound s/p attempted I/O access Wound consult nurse has evaluated  Plastic surgery consulted, appreciate recs  ??HIT, no plt drop < 50% (190>120) Monitor closely   DVT prophylaxis: heparin Code Status: full Family Communication: None at bedside  Level of care: Telemetry Cardiac Status is: Inpatient  Remains inpatient appropriate because:Inpatient level of care appropriate due to severity of illness  Dispo: The patient  is from: Home              Anticipated d/c is to: CIR              Patient currently is not medically stable to d/c.   Difficult to place patient No        Consultants:  Cardiology, electrophysiology, nephrology  Procedures: LHC  Antimicrobials:  unasyn    Subjective: Denies any new complaints  Objective: Vitals:   06/04/21 1300 06/04/21 1400 06/04/21 1500 06/04/21 1623  BP: (!) 144/64 (!) 155/70 (!) 147/67 (!) 146/98  Pulse: (!) 59 (!) 59 (!) 59 61  Resp: 19 (!) 21 20 18   Temp:    99.1 F (37.3 C)  TempSrc:    Oral  SpO2: 93% 97% 94% 95%  Weight:      Height:        Intake/Output Summary (Last 24 hours) at 06/04/2021 1950 Last data filed at 06/04/2021 1700 Gross per 24 hour  Intake 2013.04 ml  Output 2255 ml  Net -241.96 ml   Filed Weights   05/31/21 2200  Weight: 109.5 kg    Examination: General: NAD, chronically ill appearing  Cardiovascular: S1, S2 present Respiratory: CTAB Abdomen: Soft, nontender, nondistended, bowel sounds present Musculoskeletal: LUE and LLE wound noted, dressing C/D/I, no bilateral pedal edema noted Skin: As noted above Psychiatry: Normal mood     Data Reviewed: I have personally reviewed following labs and imaging studies  CBC: Recent Labs  Lab 06/01/21 0303 06/02/21 0450 06/02/21 1607 06/03/21 0346 06/04/21 0503  WBC 24.8* 14.2* 14.8* 13.4* 9.9  HGB 15.0 11.6* 12.5* 11.8* 11.0*  HCT 44.9 35.8* 38.5* 36.4* 33.2*  MCV 93.3 96.5 96.7 95.3 93.8  PLT 190 120* 129* 114* 111*  Basic Metabolic Panel: Recent Labs  Lab 06/01/21 0303 06/02/21 0450 06/02/21 1431 06/03/21 0346 06/04/21 0503  NA 136 141 136 134* 134*  K 3.6 3.8 4.5 4.5 4.3  CL 110 110 104 104 105  CO2 18* 18* 20* 20* 19*  GLUCOSE 173* 95 137* 131* 161*  BUN 34* 45* 53* 58* 64*  CREATININE 2.67* 4.51* 5.06* 5.76* 6.22*  CALCIUM 9.9 7.7* 8.8* 8.6* 8.5*  MG 3.6*  --   --   --   --   PHOS 2.8  --   --   --   --    GFR: Estimated Creatinine  Clearance: 15.3 mL/min (A) (by C-G formula based on SCr of 6.22 mg/dL (H)). Liver Function Tests: No results for input(s): AST, ALT, ALKPHOS, BILITOT, PROT, ALBUMIN in the last 168 hours. No results for input(s): LIPASE, AMYLASE in the last 168 hours. No results for input(s): AMMONIA in the last 168 hours. Coagulation Profile: No results for input(s): INR, PROTIME in the last 168 hours. Cardiac Enzymes: No results for input(s): CKTOTAL, CKMB, CKMBINDEX, TROPONINI in the last 168 hours. BNP (last 3 results) No results for input(s): PROBNP in the last 8760 hours. HbA1C: No results for input(s): HGBA1C in the last 72 hours.  CBG: Recent Labs  Lab 06/03/21 2336 06/04/21 0332 06/04/21 0750 06/04/21 1142 06/04/21 1721  GLUCAP 84 98 128* 89 100*   Lipid Profile: No results for input(s): CHOL, HDL, LDLCALC, TRIG, CHOLHDL, LDLDIRECT in the last 72 hours. Thyroid Function Tests: No results for input(s): TSH, T4TOTAL, FREET4, T3FREE, THYROIDAB in the last 72 hours. Anemia Panel: No results for input(s): VITAMINB12, FOLATE, FERRITIN, TIBC, IRON, RETICCTPCT in the last 72 hours. Urine analysis:    Component Value Date/Time   COLORURINE YELLOW 06/02/2021 1431   APPEARANCEUR CLOUDY (A) 06/02/2021 1431   LABSPEC 1.020 06/02/2021 1431   PHURINE 5.0 06/02/2021 1431   GLUCOSEU 150 (A) 06/02/2021 1431   HGBUR LARGE (A) 06/02/2021 1431   BILIRUBINUR NEGATIVE 06/02/2021 1431   KETONESUR NEGATIVE 06/02/2021 1431   PROTEINUR 100 (A) 06/02/2021 1431   NITRITE NEGATIVE 06/02/2021 1431   LEUKOCYTESUR LARGE (A) 06/02/2021 1431   Sepsis Labs: @LABRCNTIP (procalcitonin:4,lacticidven:4)  ) Recent Results (from the past 240 hour(s))  C Difficile Quick Screen w PCR reflex     Status: None   Collection Time: 06/01/21 10:22 AM   Specimen: STOOL  Result Value Ref Range Status   C Diff antigen NEGATIVE NEGATIVE Final   C Diff toxin NEGATIVE NEGATIVE Final   C Diff interpretation No C. difficile  detected.  Final    Comment: Performed at [facility], [place] [address], [place], [place] [address]  MRSA Next Gen by PCR, Nasal     Status: None   Collection Time: 06/02/21  8:30 AM  Result Value Ref Range Status   MRSA by PCR Next Gen NOT DETECTED NOT DETECTED Final    Comment: (NOTE) The GeneXpert MRSA Assay (FDA approved for NASAL specimens only), is one component of a comprehensive MRSA colonization surveillance program. It is not intended to diagnose MRSA infection nor to guide or monitor treatment for MRSA infections. Test performance is not FDA approved in patients less than 70 years old. Performed at [facility], [place] [address] [place], [place] [address]   Culture, Urine     Status: None   Collection Time: 06/02/21  2:31 PM   Specimen: Urine, Catheterized  Result Value Ref Range Status   Specimen Description  URINE, CATHETERIZED  Final   Special Requests NONE  Final   Culture   Final    NO GROWTH Performed at [facility], [place] [address], [place], [place] [address]    Report Status 06/03/2021 FINAL  Final  Culture, blood (routine x 2)     Status: None (Preliminary result)   Collection Time: 06/02/21  4:59 PM   Specimen: BLOOD RIGHT HAND  Result Value Ref Range Status   Specimen Description BLOOD RIGHT HAND  Final   Special Requests   Final    BOTTLES DRAWN AEROBIC ONLY Blood Culture results may not be optimal due to an inadequate volume of blood received in culture bottles   Culture   Final    NO GROWTH 2 DAYS Performed at [facility], [place] [address] [place], [place] [address]    Report Status PENDING  Incomplete  Culture, blood (routine x 2)     Status: None (Preliminary result)   Collection Time: 06/02/21  5:08 PM   Specimen: BLOOD RIGHT HAND  Result Value Ref Range Status   Specimen Description BLOOD RIGHT HAND  Final   Special Requests   Final    BOTTLES DRAWN AEROBIC ONLY Blood Culture results may not be optimal due to an inadequate  volume of blood received in culture bottles   Culture   Final    NO GROWTH 2 DAYS Performed at [facility], [place] [address], [place], [place] [address]    Report Status PENDING  Incomplete         Radiology Studies: CT HEAD WO CONTRAST  Result Date: 06/03/2021 CLINICAL DATA:  Anoxic brain injury EXAM: CT HEAD WITHOUT CONTRAST TECHNIQUE: Contiguous axial images were obtained from the base of the skull through the vertex without intravenous contrast. COMPARISON:  None. FINDINGS: Brain: There is no mass, hemorrhage or extra-axial collection. The size and configuration of the ventricles and extra-axial CSF spaces are normal. The brain parenchyma is normal, without acute or chronic infarction. Vascular: No abnormal hyperdensity of the major intracranial arteries or dural venous sinuses. No intracranial atherosclerosis. Skull: The visualized skull base, calvarium and extracranial soft tissues are normal. Sinuses/Orbits: No fluid levels or advanced mucosal thickening of the visualized paranasal sinuses. No mastoid or middle ear effusion. The orbits are normal. IMPRESSION: Normal head CT. Electronically Signed   By: Ulyses Jarred M.D.   On: 06/03/2021 22:42        Scheduled Meds:  amiodarone  400 mg Oral BID   aspirin  81 mg Oral Daily   atorvastatin  80 mg Oral Daily   carvedilol  3.125 mg Oral BID WC   Chlorhexidine Gluconate Cloth  6 each Topical Daily   insulin aspart  0-9 Units Subcutaneous Q4H   mouth rinse  15 mL Mouth Rinse BID   polyethylene glycol  17 g Oral Daily   senna-docusate  1 tablet Oral BID   sodium chloride flush  10-40 mL Intracatheter Q12H   sodium chloride flush  3 mL Intravenous Q12H   Continuous Infusions:  sodium chloride     sodium chloride     sodium chloride 50 mL/hr at 06/04/21 1500   ampicillin-sulbactam (UNASYN) IV Stopped (06/04/21 1022)   heparin 1,200 Units/hr (06/04/21 1546)     LOS: 4 days     Alma Friendly, MD [facility]   If 7PM-7AM, please contact night-coverage 06/04/2021, 7:50 PM

## 2021-06-04 NOTE — Progress Notes (Addendum)
Inpatient Rehab Admissions Coordinator Note:   Per therapy recommendations, pt was screened for CIR candidacy by Adelle Zachar, MS CCC-SLP. At this time, Pt. Appears to have functional decline and is a potential  candidate for CIR. Will place order for rehab consult per protocol.  Please contact me with questions.   Naomii Kreger, MS, CCC-SLP Rehab Admissions Coordinator  [phone] (celll) [phone] (office)  

## 2021-06-04 NOTE — Evaluation (Signed)
Physical Therapy Evaluation Patient Details Name: Ross [name] MRN: [id number] DOB: 1956-06-06 Today's Date: 06/04/2021   History of Present Illness  65 yo admitted 6/11 after Vfib cardiac arrest at home with greater than an hour resuscitation and 10+ shocks, cath demonstrating severe CAD. Extubated 6/12. L UE/LE skin necrosis- wounds dressed by plastic surgery. PMhx: cardiomyopathy, CABG, CHF, DM, HTN, PPM, Afib, obesity, neuropathy.  Clinical Impression  Pt admitted with/for Vfib cardiac arrest.  Pt's mentation not at baseline and pt needing moderate assist of 1-2 persons for basic mobility and gait.Marland Kitchen  Pt currently limited functionally due to the problems listed. ( See problems list.)   Pt will benefit from PT to maximize function and safety in order to get ready for next venue listed below.     Follow Up Recommendations CIR;Supervision/Assistance - 24 hour    Equipment Recommendations  Other (comment) (TBA)    Recommendations for Other Services Rehab consult     Precautions / Restrictions Precautions Precautions: Fall      Mobility  Bed Mobility Overal bed mobility: Needs Assistance Bed Mobility: Supine to Sit;Sit to Supine     Supine to sit: Mod assist;+2 for physical assistance Sit to supine: Mod assist;+2 for physical assistance   General bed mobility comments: cues for technique, sequencing, truncal assist up /forward, stability during pivot.    Transfers Overall transfer level: Needs assistance Equipment used: Rolling walker (2 wheeled) Transfers: Sit to/from Stand Sit to Stand: Mod assist;+2 physical assistance         General transfer comment: cues for hand placement, forward and boost assist.  Ambulation/Gait Ambulation/Gait assistance: Mod assist;Min assist (mod for cuing, min stability) Gait Distance (Feet): 2 Feet   Gait Pattern/deviations: Step-to pattern     General Gait Details: side stepping up toward HOB.  moderate for cues to initiate and  sequence.  Min for stability.  Stairs            Wheelchair Mobility    Modified Rankin (Stroke Patients Only)       Balance Overall balance assessment: Needs assistance Sitting-balance support: Single extremity supported;No upper extremity supported;Feet supported Sitting balance-Leahy Scale: Fair     Standing balance support: Bilateral upper extremity supported;No upper extremity supported Standing balance-Leahy Scale: Poor Standing balance comment: reliant on AD or external support.                             Pertinent Vitals/Pain Pain Assessment: Faces Faces Pain Scale: Hurts even more Pain Location: L knee, chest from CPR, catheter Pain Descriptors / Indicators: Aching;Burning;Discomfort;Grimacing;Guarding Pain Intervention(s): Limited activity within patient's tolerance;Monitored during session    [place] expects to be discharged to:: Private residence Living Arrangements: Alone Available Help at Discharge: Family Type of Home: House Home Access: Stairs to enter   Technical brewer of Steps: 4 Home Layout: One level Home Equipment: None      Prior Function Level of Independence: Needs assistance;Independent   Gait / Transfers Assistance Needed: independent  ADL's / Homemaking Assistance Needed: independent ADLs, IAdls and driving        Hand Dominance        Extremity/Trunk Assessment   Upper Extremity Assessment Upper Extremity Assessment: Defer to OT evaluation    Lower Extremity Assessment Lower Extremity Assessment: Generalized weakness       Communication   Communication: No difficulties  Cognition Arousal/Alertness: Awake/alert Behavior During Therapy: WFL for tasks assessed/performed;Agitated Overall Cognitive Status:  Impaired/Different from baseline Area of Impairment: Orientation;Attention;Memory;Following commands;Awareness;Problem solving                 Orientation Level:  Situation;Time Current Attention Level: Focused;Sustained Memory: Decreased short-term memory Following Commands: Follows one step commands with increased time;Follows one step commands inconsistently   Awareness: Intellectual Problem Solving: Slow processing;Decreased initiation;Difficulty sequencing        General Comments General comments (skin integrity, edema, etc.): VSS in general    Exercises     Assessment/Plan    PT Assessment Patient needs continued PT services  PT Problem List Decreased strength;Decreased activity tolerance;Decreased balance;Decreased mobility;Decreased knowledge of use of DME;Decreased safety awareness;Pain       PT Treatment Interventions DME instruction;Gait training;Stair training;Functional mobility training;Therapeutic activities;Patient/family education    PT Goals (Current goals can be found in the Care Plan section)  Acute Rehab PT Goals PT Goal Formulation: Patient unable to participate in goal setting Time For Goal Achievement: 06/18/21 Potential to Achieve Goals: Good    Frequency Min 3X/week   Barriers to discharge        Co-evaluation PT/OT/SLP Co-Evaluation/Treatment: Yes Reason for Co-Treatment: For patient/therapist safety;To address functional/ADL transfers PT goals addressed during session: Mobility/safety with mobility OT goals addressed during session: ADL's and self-care       AM-PAC PT "6 Clicks" Mobility  Outcome Measure Help needed turning from your back to your side while in a flat bed without using bedrails?: A Lot Help needed moving from lying on your back to sitting on the side of a flat bed without using bedrails?: A Lot Help needed moving to and from a bed to a chair (including a wheelchair)?: A Lot Help needed standing up from a chair using your arms (e.g., wheelchair or bedside chair)?: A Lot Help needed to walk in hospital room?: A Lot Help needed climbing 3-5 steps with a railing? : A Lot 6 Click  Score: 12    End of Session Equipment Utilized During Treatment: Oxygen Activity Tolerance: Patient limited by pain Patient left: in bed;with call bell/phone within reach;with bed alarm set Nurse Communication: Mobility status PT Visit Diagnosis: Unsteadiness on feet (R26.81);Other abnormalities of gait and mobility (R26.89);Difficulty in walking, not elsewhere classified (R26.2);Pain Pain - part of body:  (catheter pain/burn, chest, L knee)    Time: 8242-3536 PT Time Calculation (min) (ACUTE ONLY): 35 min   Charges:   PT Evaluation $PT Eval Moderate Complexity: 1 Mod          06/04/2021  Ginger Carne., PT Acute Rehabilitation Services [phone]  (pager) [phone]  (office)  Tessie Fass Temiloluwa Recchia 06/04/2021, 12:18 PM

## 2021-06-04 NOTE — Progress Notes (Signed)
Subjective: 65 year old male with left lower extremity and left upper extremity wounds  Patient is feeling okay today.  Does not endorse any pain to his left lower or left upper extremity.  Objective: Vital signs in last 24 hours: Temp:  [97.7 F (36.5 C)-99.5 F (37.5 C)] 98.5 F (36.9 C) (06/15 1100) Pulse Rate:  [58-71] 59 (06/15 1500) Resp:  [15-25] 20 (06/15 1500) BP: (112-155)/(51-89) 147/67 (06/15 1500) SpO2:  [92 %-100 %] 94 % (06/15 1500) Last BM Date: 06/03/21  Intake/Output from previous day: 06/14 0701 - 06/15 0700 In: 2114.7 [I.V.:1914; IV Piggyback:200.6] Out: 1855 [Urine:1855] Intake/Output this shift: Total I/O In: 982.8 [P.O.:360; I.V.:522.7; IV Piggyback:100.1] Out: 900 [Urine:900]  General appearance: alert, no distress, and ill appearing Extremities: left lower extremity with dressing in place. Left upper extremity with 2+ radial pulse, distal extremity warm to touch, few small residual blisters noted on distal extremity.  No purulent drainage noted.  No crepitus noted.  Some pain with palpation. Healthy tissue noted beneath large blister.  Lab Results:  CBC Latest Ref Rng & Units 06/04/2021 06/03/2021 06/02/2021  WBC 4.0 - 10.5 K/uL 9.9 13.4(H) 14.8(H)  Hemoglobin 13.0 - 17.0 g/dL 11.0(L) 11.8(L) 12.5(L)  Hematocrit 39.0 - 52.0 % 33.2(L) 36.4(L) 38.5(L)  Platelets 150 - 400 K/uL 111(L) 114(L) 129(L)    BMET Recent Labs    06/03/21 0346 06/04/21 0503  NA 134* 134*  K 4.5 4.3  CL 104 105  CO2 20* 19*  GLUCOSE 131* 161*  BUN 58* 64*  CREATININE 5.76* 6.22*  CALCIUM 8.6* 8.5*   PT/INR No results for input(s): LABPROT, INR in the last 72 hours. ABG No results for input(s): PHART, HCO3 in the last 72 hours.  Invalid input(s): PCO2, PO2  Studies/Results: CT HEAD WO CONTRAST  Result Date: 06/03/2021 CLINICAL DATA:  Anoxic brain injury EXAM: CT HEAD WITHOUT CONTRAST TECHNIQUE: Contiguous axial images were obtained from the base of the skull through  the vertex without intravenous contrast. COMPARISON:  None. FINDINGS: Brain: There is no mass, hemorrhage or extra-axial collection. The size and configuration of the ventricles and extra-axial CSF spaces are normal. The brain parenchyma is normal, without acute or chronic infarction. Vascular: No abnormal hyperdensity of the major intracranial arteries or dural venous sinuses. No intracranial atherosclerosis. Skull: The visualized skull base, calvarium and extracranial soft tissues are normal. Sinuses/Orbits: No fluid levels or advanced mucosal thickening of the visualized paranasal sinuses. No mastoid or middle ear effusion. The orbits are normal. IMPRESSION: Normal head CT. Electronically Signed   By: Ulyses Jarred M.D.   On: 06/03/2021 22:42    Anti-infectives: Anti-infectives (From admission, onward)    Start     Dose/Rate Route Frequency Ordered Stop   06/02/21 2200  ampicillin-sulbactam (UNASYN) 1.5 g in sodium chloride 0.9 % 100 mL IVPB        1.5 g 200 mL/hr over 30 Minutes Intravenous Every 12 hours 06/02/21 1242 06/06/21 0959   06/01/21 0000  ampicillin-sulbactam (UNASYN) 1.5 g in sodium chloride 0.9 % 100 mL IVPB  Status:  Discontinued        1.5 g 200 mL/hr over 30 Minutes Intravenous Every 6 hours 05/31/21 2311 06/02/21 1242       Assessment/Plan:  Left upper and left lower extremity wounds:  Recommend continuing with xeroform dressing changes daily to left upper and left lower extremity. Cover with gauze and kerlix.  No surgical intervention planned at this time. We will continue to follow.  LOS: 4 days    Charlies Constable, PA-C 06/04/2021

## 2021-06-04 NOTE — Progress Notes (Addendum)
Progress Note  Patient Name: Ross [name] Date of Encounter: 06/04/2021  [facility] Cardiologist: Sinclair Grooms, MD   Subjective   Working with PT, foley is hurting  Inpatient Medications    Scheduled Meds:  amiodarone  400 mg Oral BID   aspirin  81 mg Oral Daily   atorvastatin  80 mg Oral Daily   carvedilol  3.125 mg Oral BID WC   Chlorhexidine Gluconate Cloth  6 each Topical Daily   insulin aspart  0-9 Units Subcutaneous Q4H   mouth rinse  15 mL Mouth Rinse BID   polyethylene glycol  17 g Oral Daily   senna-docusate  1 tablet Oral BID   sodium chloride flush  10-40 mL Intracatheter Q12H   sodium chloride flush  3 mL Intravenous Q12H   Continuous Infusions:  sodium chloride     sodium chloride     sodium chloride Stopped (06/04/21 0952)   ampicillin-sulbactam (UNASYN) IV 200 mL/hr at 06/04/21 1000   heparin 1,200 Units/hr (06/04/21 1000)   PRN Meds: sodium chloride, acetaminophen, ALPRAZolam, ipratropium-albuterol, ondansetron (ZOFRAN) IV, oxyCODONE-acetaminophen, sodium chloride flush, sodium chloride flush   Vital Signs    Vitals:   06/04/21 0700 06/04/21 0800 06/04/21 0900 06/04/21 1000  BP: 124/66 135/68 125/63 137/60  Pulse: 61 (!) 59 64 62  Resp: (!) 24 (!) 25 19 (!) 21  Temp: 99.5 F (37.5 C)     TempSrc: Oral     SpO2: 98% 97% 92% 96%  Weight:      Height:        Intake/Output Summary (Last 24 hours) at 06/04/2021 1106 Last data filed at 06/04/2021 1000 Gross per 24 hour  Intake 2134.9 ml  Output 2045 ml  Net 89.9 ml   Last 3 Weights 05/31/2021 05/29/2021 04/14/2021  Weight (lbs) 241 lb 6.5 oz 238 lb 240 lb 3.2 oz  Weight (kg) 109.5 kg 107.956 kg 108.954 kg      Telemetry    AFib rate controlled 60's pacing intermittently as well- Personally Reviewed  ECG    No new EKGs - Personally Reviewed  Physical Exam   GEN: No acute distress, working with pt, standing at bedside, chronically ill appearing Neck: No JVD Cardiac:  irreg-irreg, no murmurs, rubs, or gallops.  Respiratory: soft exp wheezes (ant) GI: Soft, nontender, non-distended  MS: No edema; No deformity. L arm/leg are dressed Neuro:  noted to have intermittent confusion, his is able to be reoriented Psych: cooperative  Labs    High Sensitivity Troponin:  No results for input(s): TROPONINIHS in the last 720 hours.    Chemistry Recent Labs  Lab 06/02/21 1431 06/03/21 0346 06/04/21 0503  NA 136 134* 134*  K 4.5 4.5 4.3  CL 104 104 105  CO2 20* 20* 19*  GLUCOSE 137* 131* 161*  BUN 53* 58* 64*  CREATININE 5.06* 5.76* 6.22*  CALCIUM 8.8* 8.6* 8.5*  GFRNONAA 12* 10* 9*  ANIONGAP 12 10 10      Hematology Recent Labs  Lab 06/02/21 1607 06/03/21 0346 06/04/21 0503  WBC 14.8* 13.4* 9.9  RBC 3.98* 3.82* 3.54*  HGB 12.5* 11.8* 11.0*  HCT 38.5* 36.4* 33.2*  MCV 96.7 95.3 93.8  MCH 31.4 30.9 31.1  MCHC 32.5 32.4 33.1  RDW 13.3 13.3 13.3  PLT 129* 114* 111*    BNPNo results for input(s): BNP, PROBNP in the last 168 hours.   DDimer No results for input(s): DDIMER in the last 168 hours.   Radiology  DG CHEST PORT 1 VIEW Result Date: 06/01/2021 CLINICAL DATA:  PICC line placement EXAM: PORTABLE CHEST 1 VIEW COMPARISON:  One day prior FINDINGS: Endotracheal tube terminates 4.8 cm above carina. Nasogastric tube extends beyond the inferior aspect of the film. Single lead pacer. Right PICC line has been placed in the interval. Terminates at either the superior caval/atrial junction or high right atrium. Midline trachea. Borderline cardiomegaly, accentuated by AP portable technique. No pleural effusion or pneumothorax. No congestive failure. Clear lungs. IMPRESSION: Right PICC line terminating at the superior caval/atrial junction or high right atrium. No pneumothorax or other acute complication. Electronically Signed   By: Abigail Miyamoto M.D.   On: 06/01/2021 07:33    Cardiac Studies    06/01/21: TTE IMPRESSIONS   1. Left ventricular  ejection fraction, by estimation, is 35 to 40%. The  left ventricle has moderately decreased function. The left ventricle has  no regional wall motion abnormalities. There is severe concentric left  ventricular hypertrophy. Left  ventricular diastolic parameters are indeterminate. Elevated left  ventricular end-diastolic pressure. There is akinesis of the left  ventricular, mid anteroseptal wall. There is akinesis of the left  ventricular, apical septal wall, inferior wall and  anterior wall. There is akinesis of the left ventricular, apical segment.   2. Right ventricular systolic function was not well visualized. The right  ventricular size is mildly enlarged. Tricuspid regurgitation signal is  inadequate for assessing PA pressure.   3. Right atrial size was severely dilated.   4. The mitral valve is normal in structure. Trivial mitral valve  regurgitation. No evidence of mitral stenosis.   5. The aortic valve is tricuspid. Aortic valve regurgitation is not  visualized. Mild aortic valve sclerosis is present, with no evidence of  aortic valve stenosis.   6. Aortic dilatation noted. There is mild dilatation of the aortic root,  measuring 41 mm.   7. The inferior vena cava is dilated in size with >50% respiratory  variability, suggesting right atrial pressure of 8 mmHg.    05/31/21; LHC Prox RCA to Mid RCA lesion is 99% stenosed. Dist RCA lesion is 80% stenosed. Ramus lesion is 100% stenosed. Origin to Prox Graft lesion is 100% stenosed. Origin lesion is 100% stenosed. Prox LAD lesion is 70% stenosed. Prox LAD to Mid LAD lesion is 70% stenosed. Mid LAD-1 lesion is 90% stenosed. Mid LAD-2 lesion is 80% stenosed.   VF cardiac arrest with prolonged resuscitative effort and ultimate restoration of paced rhythm following multiple shocks, amiodarone, and Levophed infusion.   Severe native CAD with diffuse severe proximal LAD stenoses of 70 to 80% with focal 90% stenosis between the  first and second septal perforating artery with total occlusion of the first diagonal vessel and a flush and fill phenomena of the mid LAD secondary to competitive filling via the LIMA graft.   Old occlusion of the ramus immediate vessel and obtuse marginal vessel   Dominant RCA with previously noted  diffuse 90% mid stenosis which now has right to right collateralization and diffuse 80% distal stenosis.   Patent LIMA graft supplying the mid LAD.   Patent SVG supplying the first diagonal vessel.   Old occlusion of the vein graft to a ramus intermediate vessel.   Old occlusion of the vein graft which had supplied the distal RCA.   LVEDP 20 mmHg   RECOMMENDATION: Patient is admitted to the critical care service.  We Zayah Keilman plan 2D echo Doppler study in a.m.  He has been  seen by Dr. Rayann Heman in the emergency room during his prolonged resuscitative efforts today.  Guideline directed medical therapy for CHF.  Anticipate ICD implantation per electrophysiology.     02/27/21: TTE IMPRESSIONS   1. There is diffuse hypokinesis with paradoxical septal motion and apical  aneurysm with LVEF 25-30%.   2. Left ventricular ejection fraction, by estimation, is 25 to 30%. The  left ventricle has severely decreased function. The left ventricle  demonstrates global hypokinesis. The left ventricular internal cavity size  was moderately dilated. There is  moderate concentric left ventricular hypertrophy. Left ventricular  diastolic function could not be evaluated.   3. Right ventricular systolic function is mildly reduced. The right  ventricular size is mildly enlarged. There is normal pulmonary artery  systolic pressure.   4. Left atrial size was moderately dilated.   5. Right atrial size was mildly dilated.   6. The mitral valve is normal in structure. Mild mitral valve  regurgitation. No evidence of mitral stenosis.   7. The aortic valve is normal in structure. Aortic valve regurgitation is  not  visualized. No aortic stenosis is present.   8. Aortic dilatation noted. There is mild dilatation of the ascending  aorta, measuring 41 mm.   9. The inferior vena cava is normal in size with greater than 50%  respiratory variability, suggesting right atrial pressure of 3 mmHg.   Patient Profile     65 y.o. male with PMHx of CAD (CABG), ICM (new), permanent AFib, symptomatic bradycardia with PPM, DM, obesity  Assessment & Plan    1.  S/p VF arrest      Review of cahrt notes an hour of CPR and numerous defibrillations 2. Respiratoy failure     Soft exp wheezes     CCM is on board     Antibiotics for presumed aspiration with prolonged code 3. Shock     He has made remarkable early recovery.       Extubated Sunday     Remains Off pressors  AAO x3, though no independent knowledge of events and having periods of intermittent confusion Continue amiodarone gtt today again, probably transition to PO tomorrow  Timing of PPM > ICD Morell Mears depend on renal recovery and wounds        2. Ischemic CM/ CAD s/p CABG/ acute on chronic systolic dysfunction No targets for intervention Continue management with attending cards team   3. Afib Permanent by history (SR now s/p numerous defibrillations) On amio for his arrest, suspect he Shermeka Rutt not hold sinus CHA2DS2Vasc is at least 4 Eliquis out patient, heparin gtt here  4. AKI Creat continues to climb Nephrology on board Hopeful for recovery  5. L arm, L leg Skin blisters, and ecchymosis 2/2 pressor extravasation and IO attempt, ?  Unclear if used Appreciate plastic sx help and recommendations So far no need for surgery   For questions or updates, please contact [place] Please consult [email or web address] for contact info under        Signed, Baldwin Jamaica, PA-C  06/04/2021, 11:06 AM    I have seen and examined this patient with Tommye Standard.  Agree with above, note added to reflect my findings.  On exam, irregular, no murmurs.   Unfortunately creatinine continues to worsen.  Fortunately, he has had no further arrhythmias.  We Kaileena Obi continue with IV amiodarone throughout the day today.  We Alvis Pulcini plan to switch him to p.o. amiodarone 400 mg twice daily for 2 weeks followed by  200 mg a day.  He Retia Cordle need a LifeVest at discharge and Rokhaya Quinn need to follow-up in EP clinic for discussions of ICD upgrade.     Oni Dietzman M. Shailyn Weyandt MD 06/04/2021 12:18 PM

## 2021-06-04 NOTE — Plan of Care (Signed)

## 2021-06-04 NOTE — Progress Notes (Signed)
Progress Note  Patient Name: Ross [name] Date of Encounter: 06/04/2021  [facility] Cardiologist: Sinclair Grooms, MD   Subjective   Patient alert and communicative.  Oriented but occasionally somewhat confused.  Denies chest pain.  Complains of some shortness of breath.  Inpatient Medications    Scheduled Meds:  aspirin  81 mg Oral Daily   atorvastatin  80 mg Oral Daily   carvedilol  3.125 mg Oral BID WC   Chlorhexidine Gluconate Cloth  6 each Topical Daily   insulin aspart  0-9 Units Subcutaneous Q4H   mouth rinse  15 mL Mouth Rinse BID   polyethylene glycol  17 g Oral Daily   senna-docusate  1 tablet Oral BID   sodium chloride flush  10-40 mL Intracatheter Q12H   sodium chloride flush  3 mL Intravenous Q12H   Continuous Infusions:  sodium chloride     sodium chloride     sodium chloride 50 mL/hr at 06/04/21 0800   amiodarone 30 mg/hr (06/04/21 0800)   ampicillin-sulbactam (UNASYN) IV 200 mL/hr at 06/03/21 2300   heparin 1,200 Units/hr (06/04/21 0800)   PRN Meds: sodium chloride, acetaminophen, ALPRAZolam, ipratropium-albuterol, ondansetron (ZOFRAN) IV, oxyCODONE-acetaminophen, sodium chloride flush, sodium chloride flush   Vital Signs    Vitals:   06/04/21 0500 06/04/21 0600 06/04/21 0700 06/04/21 0800  BP: 134/61 124/61 124/66 135/68  Pulse: (!) 59 71 61 (!) 59  Resp: 19 19 (!) 24 (!) 25  Temp:   99.5 F (37.5 C)   TempSrc:   Oral   SpO2: 97% 95% 98% 97%  Weight:      Height:        Intake/Output Summary (Last 24 hours) at 06/04/2021 0914 Last data filed at 06/04/2021 0800 Gross per 24 hour  Intake 1959.65 ml  Output 1815 ml  Net 144.65 ml   Last 3 Weights 05/31/2021 05/29/2021 04/14/2021  Weight (lbs) 241 lb 6.5 oz 238 lb 240 lb 3.2 oz  Weight (kg) 109.5 kg 107.956 kg 108.954 kg      Telemetry    Underlying rhythm A. fib, V paced- personally Reviewed  ECG    None performed today  Personally Reviewed  Physical Exam   GEN: No acute  distress.   Neck: No JVD Cardiac: RRR, no murmurs, rubs, or gallops.  Respiratory: Clear to auscultation bilaterally. GI: Soft, nontender, non-distended  MS: No edema; No deformity. Neuro:  Nonfocal  Psych: Normal affect   Labs    High Sensitivity Troponin:  No results for input(s): TROPONINIHS in the last 720 hours.    Chemistry Recent Labs  Lab 06/02/21 1431 06/03/21 0346 06/04/21 0503  NA 136 134* 134*  K 4.5 4.5 4.3  CL 104 104 105  CO2 20* 20* 19*  GLUCOSE 137* 131* 161*  BUN 53* 58* 64*  CREATININE 5.06* 5.76* 6.22*  CALCIUM 8.8* 8.6* 8.5*  GFRNONAA 12* 10* 9*  ANIONGAP 12 10 10      Hematology Recent Labs  Lab 06/02/21 1607 06/03/21 0346 06/04/21 0503  WBC 14.8* 13.4* 9.9  RBC 3.98* 3.82* 3.54*  HGB 12.5* 11.8* 11.0*  HCT 38.5* 36.4* 33.2*  MCV 96.7 95.3 93.8  MCH 31.4 30.9 31.1  MCHC 32.5 32.4 33.1  RDW 13.3 13.3 13.3  PLT 129* 114* 111*    BNPNo results for input(s): BNP, PROBNP in the last 168 hours.   DDimer No results for input(s): DDIMER in the last 168 hours.   Radiology    CT  HEAD WO CONTRAST  Result Date: 06/03/2021 CLINICAL DATA:  Anoxic brain injury EXAM: CT HEAD WITHOUT CONTRAST TECHNIQUE: Contiguous axial images were obtained from the base of the skull through the vertex without intravenous contrast. COMPARISON:  None. FINDINGS: Brain: There is no mass, hemorrhage or extra-axial collection. The size and configuration of the ventricles and extra-axial CSF spaces are normal. The brain parenchyma is normal, without acute or chronic infarction. Vascular: No abnormal hyperdensity of the major intracranial arteries or dural venous sinuses. No intracranial atherosclerosis. Skull: The visualized skull base, calvarium and extracranial soft tissues are normal. Sinuses/Orbits: No fluid levels or advanced mucosal thickening of the visualized paranasal sinuses. No mastoid or middle ear effusion. The orbits are normal. IMPRESSION: Normal head CT.  Electronically Signed   By: Ulyses Jarred M.D.   On: 06/03/2021 22:42    Cardiac Studies   2D echocardiogram (06/01/2021)  IMPRESSIONS     1. Left ventricular ejection fraction, by estimation, is 35 to 40%. The  left ventricle has moderately decreased function. The left ventricle has  no regional wall motion abnormalities. There is severe concentric left  ventricular hypertrophy. Left  ventricular diastolic parameters are indeterminate. Elevated left  ventricular end-diastolic pressure. There is akinesis of the left  ventricular, mid anteroseptal wall. There is akinesis of the left  ventricular, apical septal wall, inferior wall and  anterior wall. There is akinesis of the left ventricular, apical segment.   2. Right ventricular systolic function was not well visualized. The right  ventricular size is mildly enlarged. Tricuspid regurgitation signal is  inadequate for assessing PA pressure.   3. Right atrial size was severely dilated.   4. The mitral valve is normal in structure. Trivial mitral valve  regurgitation. No evidence of mitral stenosis.   5. The aortic valve is tricuspid. Aortic valve regurgitation is not  visualized. Mild aortic valve sclerosis is present, with no evidence of  aortic valve stenosis.   6. Aortic dilatation noted. There is mild dilatation of the aortic root,  measuring 41 mm.   7. The inferior vena cava is dilated in size with >50% respiratory  variability, suggesting right atrial pressure of 8 mmHg.  Cardiac catheterization (06/01/2019)  Conclusion    Prox RCA to Mid RCA lesion is 99% stenosed. Dist RCA lesion is 80% stenosed. Ramus lesion is 100% stenosed. Origin to Prox Graft lesion is 100% stenosed. Origin lesion is 100% stenosed. Prox LAD lesion is 70% stenosed. Prox LAD to Mid LAD lesion is 70% stenosed. Mid LAD-1 lesion is 90% stenosed. Mid LAD-2 lesion is 80% stenosed.   VF cardiac arrest with prolonged resuscitative effort and ultimate  restoration of paced rhythm following multiple shocks, amiodarone, and Levophed infusion.   Severe native CAD with diffuse severe proximal LAD stenoses of 70 to 80% with focal 90% stenosis between the first and second septal perforating artery with total occlusion of the first diagonal vessel and a flush and fill phenomena of the mid LAD secondary to competitive filling via the LIMA graft.   Old occlusion of the ramus immediate vessel and obtuse marginal vessel   Dominant RCA with previously noted  diffuse 90% mid stenosis which now has right to right collateralization and diffuse 80% distal stenosis.   Patent LIMA graft supplying the mid LAD.   Patent SVG supplying the first diagonal vessel.   Old occlusion of the vein graft to a ramus intermediate vessel.   Old occlusion of the vein graft which had supplied the distal RCA.  LVEDP 20 mmHg   RECOMMENDATION: Patient is admitted to the critical care service.  We will plan 2D echo Doppler study in a.m.  He has been seen by Dr. Rayann Heman in the emergency room during his prolonged resuscitative efforts today.  Guideline directed medical therapy for CHF.  Anticipate ICD implantation per electrophysiology.  Coronary Diagrams   Diagnostic Dominance: Right    Intervention     Patient Profile     Ross [name] is a 65 y.o. male with known ischemic CM (EF 25%), prior CABG, permanent afib, and DM who is admitted with VF arrest.  Assessment & Plan    1: Status post VF arrest-patient had witnessed cardiac arrest, defibrillation x10 and CPR with ROSC.  He was intubated and [facility]  He currently has a paced rhythm.  IV amiodarone has continued.  We will transition to p.o. amiodarone today.  He has been extubated and is hemodynamically stable.  He is off all pressor agents.  Amazingly, he is neurologically intact so with only mild mild intermittent confusion.  2: Ischemic cardiomyopathy-EF in the 30% range in the  past and currently 35 to 40% by 2D echo.  He was on guideline directed optimal medical therapy prior to admission followed by Dr. Tamala Julian.  Currently all meds are on hold.  These will be reinstituted depending on his clinical improvement in renal status.  Cardiac catheterization performed by Dr. Claiborne Billings on 05/31/2021 at the time of presentation showed a patent vein to ramus branch, patent LIMA to the LAD, occluded vein to the RCA with occluded native RCA and occluded vein to a diagonal branch.  Medical therapy was recommended for this.  There were no "culprit lesions".  We started the carvedilol 3.25 mg p.o. twice daily which she is tolerating.  We will keep at this dose for the time being and titrate based on his hemodynamics  3: Atrial fibrillation-history of permanent A. fib on Eliquis oral anticoagulation as an outpatient.  Currently he appears to have gone back in A. fib and is V paced.  He is on IV heparin.  Will transition back to Eliquis after his skin necrosis has been evaluated by plastics.  4: Secondary AV block-history of permanent transvenous pacemaker placed in the past for this.  [employer] to interrogate.  Plan for CRT upgrade at some point in the future according to Dr. Curt Bears although now given his skin necrosis with the need for plastic surgical intervention and underlying infection the plan is to ultimately send him home on a LifeVest and readdress pacemaker upgrade in the future.  5: Acute renal insufficiency-serum creatinine increased from 1.2-6.2 probably related to ATN from his acute event.  He is making urine.  I notified the renal surgery service (Dr. Hollie Salk) to assist in his care.  Hopefully this will ultimately resolve.  Dr. Milus Banister continues to follow but is holding off on hemodialysis at this time.  6: Left upper extremity skin necrosis-probably related to IV infiltration of his norepinephrine.  Dr. Curt Bears has consulted plastic surgery for potential surgical intervention.  Patient was  seen by plastic surgery and his wounds were dressed and wrapped.  They are following.  Patient continues to improve.  He remains hemodynamically stable.  We will keep on IV heparin until decision is made regarding ICD upgrade versus LifeVest at which time we will transition back to oral anticoagulation.  I believe he is stable to transfer to a telemetry bed.  He will need PT and  OT/cardiac rehab.     For questions or updates, please contact [place] Please consult [email or web address] for contact info under        Signed, Quay Burow, MD  06/04/2021, 9:14 AM

## 2021-06-04 NOTE — Progress Notes (Signed)
[place] for IV heparin  Indication: atrial fibrillation  No Known Allergies  Patient Measurements: Height: 6' 1.5" (186.7 cm) Weight: 109.5 kg (241 lb 6.5 oz) IBW/kg (Calculated) : 81.05 Heparin Dosing Weight: 103 kg  Vital Signs: Temp: 98.4 F (36.9 C) (06/15 0400) Temp Source: Oral (06/15 0400) BP: 124/66 (06/15 0700) Pulse Rate: 61 (06/15 0700)  Labs: Recent Labs    06/02/21 0800 06/02/21 0802 06/02/21 1431 06/02/21 1607 06/02/21 2000 06/03/21 0346 06/03/21 1230 06/04/21 0503  HGB  --   --   --  12.5*  --  11.8*  --  11.0*  HCT  --   --   --  38.5*  --  36.4*  --  33.2*  PLT  --   --   --  129*  --  114*  --  111*  APTT  --    < >  --   --    < > 63* 64* 55*  HEPARINUNFRC >1.10*  --   --   --   --  >1.10*  --  >1.10*  CREATININE  --   --  5.06*  --   --  5.76*  --  6.22*   < > = values in this interval not displayed.     Estimated Creatinine Clearance: 15.3 mL/min (A) (by C-G formula based on SCr of 6.22 mg/dL (H)).   Assessment: 65 YO M on apixaban PTA for Afib, last dose unknown at this time given patient arrived in ED undergoing CPR for Vfib for approximately 2 hours. 6/11 Cath showed stable coronaries compared to 2018. Pharmacy consulted to dose IV heparin while holding apixaban for procedures.  Heparin level elevated from apixaban use  - will use aptt for heparin dosing for now.  aPTT fell to 55sec overnight < goal range on heparin drip 1100 uts/hr. CBC stable and no bleeding documented.  Arm and leg wound from extravasation - will run anticoagulation on low end while formulating a plan.   Back in aFib CVR 60 on amiodarone.    Goal of Therapy:  Heparin level 0.3-0.7 units/ml aPTT 66-102 sec  Monitor platelets by anticoagulation protocol: Yes   Plan:  Increase heparin gtt to 1200 units/hr  Daily heparin level, aPTT and CBC. Per EP - keep on heparin until device upgrade (then back to [place]).  Bonnita Nasuti Pharm.D.  CPP, BCPS Clinical Pharmacist [phone] 06/04/2021 7:27 AM

## 2021-06-04 NOTE — Progress Notes (Signed)
Foley out  at 6pm patient due to void within 4 hours.

## 2021-06-05 DIAGNOSIS — Z951 Presence of aortocoronary bypass graft: Secondary | ICD-10-CM | POA: Diagnosis not present

## 2021-06-05 DIAGNOSIS — I4819 Other persistent atrial fibrillation: Secondary | ICD-10-CM | POA: Diagnosis not present

## 2021-06-05 DIAGNOSIS — I255 Ischemic cardiomyopathy: Secondary | ICD-10-CM | POA: Diagnosis not present

## 2021-06-05 DIAGNOSIS — I469 Cardiac arrest, cause unspecified: Secondary | ICD-10-CM | POA: Diagnosis not present

## 2021-06-05 LAB — CBC
HCT: 33.9 % — ABNORMAL LOW (ref 39.0–52.0)
Hemoglobin: 11.6 g/dL — ABNORMAL LOW (ref 13.0–17.0)
MCH: 31.4 pg (ref 26.0–34.0)
MCHC: 34.2 g/dL (ref 30.0–36.0)
MCV: 91.6 fL (ref 80.0–100.0)
Platelets: 137 10*3/uL — ABNORMAL LOW (ref 150–400)
RBC: 3.7 MIL/uL — ABNORMAL LOW (ref 4.22–5.81)
RDW: 13.3 % (ref 11.5–15.5)
WBC: 10.8 10*3/uL — ABNORMAL HIGH (ref 4.0–10.5)
nRBC: 0 % (ref 0.0–0.2)

## 2021-06-05 LAB — APTT: aPTT: 51 seconds — ABNORMAL HIGH (ref 24–36)

## 2021-06-05 LAB — GLUCOSE, CAPILLARY
Glucose-Capillary: 100 mg/dL — ABNORMAL HIGH (ref 70–99)
Glucose-Capillary: 103 mg/dL — ABNORMAL HIGH (ref 70–99)
Glucose-Capillary: 104 mg/dL — ABNORMAL HIGH (ref 70–99)
Glucose-Capillary: 118 mg/dL — ABNORMAL HIGH (ref 70–99)
Glucose-Capillary: 120 mg/dL — ABNORMAL HIGH (ref 70–99)
Glucose-Capillary: 127 mg/dL — ABNORMAL HIGH (ref 70–99)

## 2021-06-05 LAB — BASIC METABOLIC PANEL
Anion gap: 10 (ref 5–15)
BUN: 73 mg/dL — ABNORMAL HIGH (ref 8–23)
CO2: 19 mmol/L — ABNORMAL LOW (ref 22–32)
Calcium: 9.1 mg/dL (ref 8.9–10.3)
Chloride: 106 mmol/L (ref 98–111)
Creatinine, Ser: 6.78 mg/dL — ABNORMAL HIGH (ref 0.61–1.24)
GFR, Estimated: 8 mL/min — ABNORMAL LOW (ref >60)
Glucose, Bld: 113 mg/dL — ABNORMAL HIGH (ref 70–99)
Potassium: 4.2 mmol/L (ref 3.5–5.1)
Sodium: 135 mmol/L (ref 135–145)

## 2021-06-05 LAB — HEPARIN LEVEL (UNFRACTIONATED): Heparin Unfractionated: 0.78 IU/mL — ABNORMAL HIGH (ref 0.30–0.70)

## 2021-06-05 MED ORDER — APIXABAN 5 MG PO TABS
5.0000 mg | ORAL_TABLET | Freq: Two times a day (BID) | ORAL | Status: DC
  Administered 2021-06-05 – 2021-06-12 (×15): 5 mg via ORAL
  Filled 2021-06-05 (×15): qty 1

## 2021-06-05 MED ORDER — INSULIN ASPART 100 UNIT/ML IJ SOLN
0.0000 [IU] | Freq: Three times a day (TID) | INTRAMUSCULAR | Status: DC
  Administered 2021-06-07 – 2021-06-09 (×3): 1 [IU] via SUBCUTANEOUS

## 2021-06-05 NOTE — Progress Notes (Signed)
Inpatient Rehab Admissions Coordinator:   Met with patient and his daughter, Angela, at the bedside to discuss CIR goals and expectations.  I explained 3 hrs/day of therapy (PT/OT/SLP), and average length of stay usually about 2 weeks, though dependent on progress.  I also let them know that, typically, we recommend patients have 24/7 supervision once discharged from our program to ensure smooth and safe transition home.  Pt has several options for d/c setting, either his daughter's home in [place], or a significant other/friends home in [place].  I did review [employer] Medicare and need for prior authorization, and what that process looks like.  Of note, pt is alert and able to participate in the conversation easily, but does demonstrate some pretty clear cognitive deficits, particularly decreased awareness and recall, poor frustration tolerance, and perseveration.  Note that nephrology continues to monitor for potential HD needs. I will not be able to start insurance authorization process until HD needs are clarified, so I will f/u with the patient on Monday.    , PT, DPT Admissions Coordinator [phone] 06/05/21  3:41 PM  

## 2021-06-05 NOTE — Care Management Important Message (Signed)
Important Message  Patient Details  Name: Ross [name] MRN: [id number] Date of Birth: [birth year]   Medicare Important Message Given:  Yes     [name] 06/05/2021, 3:08 PM

## 2021-06-05 NOTE — Progress Notes (Signed)
Progress Note  Patient Name: Ross [name] Date of Encounter: 06/05/2021  [facility] Cardiologist: Sinclair Grooms, MD   Subjective   Patient alert and communicative.  Oriented but occasionally somewhat confused.   Complains of atypical chest pain.  Complains of some shortness of breath.  Inpatient Medications    Scheduled Meds:  amiodarone  400 mg Oral BID   aspirin  81 mg Oral Daily   atorvastatin  80 mg Oral Daily   carvedilol  3.125 mg Oral BID WC   Chlorhexidine Gluconate Cloth  6 each Topical Daily   insulin aspart  0-9 Units Subcutaneous Q4H   mouth rinse  15 mL Mouth Rinse BID   polyethylene glycol  17 g Oral Daily   senna-docusate  1 tablet Oral BID   sodium chloride flush  10-40 mL Intracatheter Q12H   sodium chloride flush  3 mL Intravenous Q12H   Continuous Infusions:  sodium chloride     sodium chloride     ampicillin-sulbactam (UNASYN) IV 1.5 g (06/04/21 2201)   heparin 1,200 Units/hr (06/05/21 0951)   PRN Meds: sodium chloride, acetaminophen, ALPRAZolam, ipratropium-albuterol, ondansetron (ZOFRAN) IV, oxyCODONE-acetaminophen, sodium chloride flush, sodium chloride flush   Vital Signs    Vitals:   06/04/21 1953 06/05/21 0020 06/05/21 0445 06/05/21 0808  BP: (!) 151/65 (!) 152/58 (!) 161/52 (!) 143/57  Pulse: 60 (!) 59 (!) 59 61  Resp: 18 20 18 18   Temp: 99.1 F (37.3 C) 99.2 F (37.3 C) 99.5 F (37.5 C) 98.6 F (37 C)  TempSrc: Oral Oral Oral Oral  SpO2: 92% 94% 92% 95%  Weight:   120.8 kg   Height:        Intake/Output Summary (Last 24 hours) at 06/05/2021 1011 Last data filed at 06/05/2021 0923 Gross per 24 hour  Intake 969.58 ml  Output 1975 ml  Net -1005.42 ml   Last 3 Weights 06/05/2021 05/31/2021 05/29/2021  Weight (lbs) 266 lb 5.1 oz 241 lb 6.5 oz 238 lb  Weight (kg) 120.8 kg 109.5 kg 107.956 kg      Telemetry    Underlying rhythm A. fib, V paced- personally Reviewed  ECG    None performed today  Personally  Reviewed  Physical Exam   GEN: No acute distress.   Neck: No JVD Cardiac: RRR, no murmurs, rubs, or gallops.  Respiratory: Clear to auscultation bilaterally. GI: Soft, nontender, non-distended  MS: No edema; No deformity. Neuro:  Nonfocal  Psych: Normal affect   Labs    High Sensitivity Troponin:  No results for input(s): TROPONINIHS in the last 720 hours.    Chemistry Recent Labs  Lab 06/03/21 0346 06/04/21 0503 06/05/21 0500  NA 134* 134* 135  K 4.5 4.3 4.2  CL 104 105 106  CO2 20* 19* 19*  GLUCOSE 131* 161* 113*  BUN 58* 64* 73*  CREATININE 5.76* 6.22* 6.78*  CALCIUM 8.6* 8.5* 9.1  GFRNONAA 10* 9* 8*  ANIONGAP 10 10 10      Hematology Recent Labs  Lab 06/03/21 0346 06/04/21 0503 06/05/21 0500  WBC 13.4* 9.9 10.8*  RBC 3.82* 3.54* 3.70*  HGB 11.8* 11.0* 11.6*  HCT 36.4* 33.2* 33.9*  MCV 95.3 93.8 91.6  MCH 30.9 31.1 31.4  MCHC 32.4 33.1 34.2  RDW 13.3 13.3 13.3  PLT 114* 111* 137*    BNPNo results for input(s): BNP, PROBNP in the last 168 hours.   DDimer No results for input(s): DDIMER in the last 168 hours.  Radiology    CT HEAD WO CONTRAST  Result Date: 06/03/2021 CLINICAL DATA:  Anoxic brain injury EXAM: CT HEAD WITHOUT CONTRAST TECHNIQUE: Contiguous axial images were obtained from the base of the skull through the vertex without intravenous contrast. COMPARISON:  None. FINDINGS: Brain: There is no mass, hemorrhage or extra-axial collection. The size and configuration of the ventricles and extra-axial CSF spaces are normal. The brain parenchyma is normal, without acute or chronic infarction. Vascular: No abnormal hyperdensity of the major intracranial arteries or dural venous sinuses. No intracranial atherosclerosis. Skull: The visualized skull base, calvarium and extracranial soft tissues are normal. Sinuses/Orbits: No fluid levels or advanced mucosal thickening of the visualized paranasal sinuses. No mastoid or middle ear effusion. The orbits are  normal. IMPRESSION: Normal head CT. Electronically Signed   By: Ulyses Jarred M.D.   On: 06/03/2021 22:42    Cardiac Studies   2D echocardiogram (06/01/2021)  IMPRESSIONS     1. Left ventricular ejection fraction, by estimation, is 35 to 40%. The  left ventricle has moderately decreased function. The left ventricle has  no regional wall motion abnormalities. There is severe concentric left  ventricular hypertrophy. Left  ventricular diastolic parameters are indeterminate. Elevated left  ventricular end-diastolic pressure. There is akinesis of the left  ventricular, mid anteroseptal wall. There is akinesis of the left  ventricular, apical septal wall, inferior wall and  anterior wall. There is akinesis of the left ventricular, apical segment.   2. Right ventricular systolic function was not well visualized. The right  ventricular size is mildly enlarged. Tricuspid regurgitation signal is  inadequate for assessing PA pressure.   3. Right atrial size was severely dilated.   4. The mitral valve is normal in structure. Trivial mitral valve  regurgitation. No evidence of mitral stenosis.   5. The aortic valve is tricuspid. Aortic valve regurgitation is not  visualized. Mild aortic valve sclerosis is present, with no evidence of  aortic valve stenosis.   6. Aortic dilatation noted. There is mild dilatation of the aortic root,  measuring 41 mm.   7. The inferior vena cava is dilated in size with >50% respiratory  variability, suggesting right atrial pressure of 8 mmHg.  Cardiac catheterization (06/01/2019)  Conclusion    Prox RCA to Mid RCA lesion is 99% stenosed. Dist RCA lesion is 80% stenosed. Ramus lesion is 100% stenosed. Origin to Prox Graft lesion is 100% stenosed. Origin lesion is 100% stenosed. Prox LAD lesion is 70% stenosed. Prox LAD to Mid LAD lesion is 70% stenosed. Mid LAD-1 lesion is 90% stenosed. Mid LAD-2 lesion is 80% stenosed.   VF cardiac arrest with prolonged  resuscitative effort and ultimate restoration of paced rhythm following multiple shocks, amiodarone, and Levophed infusion.   Severe native CAD with diffuse severe proximal LAD stenoses of 70 to 80% with focal 90% stenosis between the first and second septal perforating artery with total occlusion of the first diagonal vessel and a flush and fill phenomena of the mid LAD secondary to competitive filling via the LIMA graft.   Old occlusion of the ramus immediate vessel and obtuse marginal vessel   Dominant RCA with previously noted  diffuse 90% mid stenosis which now has right to right collateralization and diffuse 80% distal stenosis.   Patent LIMA graft supplying the mid LAD.   Patent SVG supplying the first diagonal vessel.   Old occlusion of the vein graft to a ramus intermediate vessel.   Old occlusion of the vein graft which  had supplied the distal RCA.   LVEDP 20 mmHg   RECOMMENDATION: Patient is admitted to the critical care service.  We will plan 2D echo Doppler study in a.m.  He has been seen by Dr. Rayann Heman in the emergency room during his prolonged resuscitative efforts today.  Guideline directed medical therapy for CHF.  Anticipate ICD implantation per electrophysiology.  Coronary Diagrams   Diagnostic Dominance: Right    Intervention     Patient Profile     Ross [name] is a 65 y.o. male with known ischemic CM (EF 25%), prior CABG, permanent afib, and DM who is admitted with VF arrest.  Assessment & Plan    1: Status post VF arrest-patient had witnessed cardiac arrest, defibrillation x10 and CPR with ROSC.  He was intubated and transported to [facility].  He currently has a paced rhythm.  IV amiodarone has continued.  We will transition to p.o. amiodarone today.  He has been extubated and is hemodynamically stable.  Amazingly, he is neurologically intact so with only mild mild intermittent confusion.  2: Ischemic cardiomyopathy-EF in the 30% range in  the past and currently 35 to 40% by 2D echo.  He was on guideline directed optimal medical therapy prior to admission followed by Dr. Tamala Julian.  Currently all meds are on hold.  These will be reinstituted depending on his clinical improvement in renal status.  Cardiac catheterization performed by Dr. Claiborne Billings on 05/31/2021 at the time of presentation showed a patent vein to ramus branch, patent LIMA to the LAD, occluded vein to the RCA with occluded native RCA and occluded vein to a diagonal branch.  Medical therapy was recommended for this.  There were no "culprit lesions".  We started the carvedilol 3.25 mg p.o. twice daily which he  is tolerating.  We will keep at this dose for the time being and titrate based on his hemodynamics  3: Atrial fibrillation-history of permanent A. fib on Eliquis oral anticoagulation as an outpatient.  Currently he appears to have gone back in A. fib and is V paced.  He is on IV heparin.  Will transition back to Eliquis after his skin necrosis has been evaluated by plastics.  4: Secondary AV block-history of permanent transvenous pacemaker placed in the past for this.  [employer] to interrogate.  Plan for CRT upgrade at some point in the future according to Dr. Curt Bears although now given his skin necrosis with the need for plastic surgical intervention and underlying infection the plan is to ultimately send him home on a LifeVest and readdress pacemaker upgrade in the future.  5: Acute renal insufficiency-serum creatinine increased from 1.2-6.7 probably related to ATN from his acute event.  He is making urine.  Dr. Hollie Salk from the renal service is following along and at this point does not feel that he requires dialytic therapy..  6: Left upper extremity skin necrosis-probably related to IV infiltration of his norepinephrine.  Dr. Curt Bears has consulted plastic surgery for potential surgical intervention.  Patient was seen by plastic surgery and his wounds were dressed and wrapped.   They are following.  Patient continues to improve.  He remains hemodynamically stable.  I think we can transition from IV heparin to Eliquis given the fact that there are no plans to upgrade his device during this hospitalization nor any other plans to perform surgery on his arm and leg.  He was transition from IV to p.o. amiodarone which she will get 400 mg p.o. twice daily  for the next 2 weeks per EP.  He will need OT and PT as well.  I suspect he will need transitioning home through a rehab facility.     For questions or updates, please contact [place] Please consult [email or web address] for contact info under        Signed, Quay Burow, MD  06/05/2021, 10:11 AM

## 2021-06-05 NOTE — Progress Notes (Signed)
   06/05/21 1939  Assess: MEWS Score  Temp 98.2 F (36.8 C)  BP (!) 163/118  Pulse Rate (!) 59  Resp 18  SpO2 97 %  Assess: MEWS Score  MEWS Temp 0  MEWS Systolic 0  MEWS Pulse 0  MEWS RR 0  MEWS LOC 0  MEWS Score 0  MEWS Score Color Green  Pt stable, resting in bed with significant other at bedside. No concerns or needs at this time.

## 2021-06-05 NOTE — Progress Notes (Signed)
This nurse spoke with iv team RN regarding PICC line. Iv team agreed that steps taken after the pulled PICC was appropriate.  Bedside nurse made aware.

## 2021-06-05 NOTE — Progress Notes (Signed)
Patient PICC line pulled out while ambulating to bedside commode. Pressure applied. No bleeding noted. Pressure bandage applied. IV team paged. Awaiting response. Patient resting in bed. Will continue to monitor.

## 2021-06-05 NOTE — Progress Notes (Addendum)
[place] for IV heparin > switch to Eliquis Indication: atrial fibrillation  No Known Allergies  Patient Measurements: Height: 6' 1.5" (186.7 cm) Weight: 120.8 kg (266 lb 5.1 oz) IBW/kg (Calculated) : 81.05 Heparin Dosing Weight: 103 kg  Vital Signs: Temp: 98.6 F (37 C) (06/16 0808) Temp Source: Oral (06/16 0808) BP: 143/57 (06/16 0808) Pulse Rate: 61 (06/16 0808)  Labs: Recent Labs    06/03/21 0346 06/03/21 1230 06/04/21 0503 06/05/21 0500  HGB 11.8*  --  11.0* 11.6*  HCT 36.4*  --  33.2* 33.9*  PLT 114*  --  111* 137*  APTT 63* 64* 55* 51*  HEPARINUNFRC >1.10*  --  >1.10* 0.78*  CREATININE 5.76*  --  6.22* 6.78*     Estimated Creatinine Clearance: 14.7 mL/min (A) (by C-G formula based on SCr of 6.78 mg/dL (H)).   Assessment: 65 YO M on apixaban PTA for Afib, last dose unknown at this time given patient arrived in ED undergoing CPR for Vfib for approximately 2 hours. 6/11 Cath showed stable coronaries compared to 2018. Pharmacy consulted to dose IV heparin while holding apixaban for procedures.   Arm and leg wound from extravasation - no plans for OR intervention.  Device upgrade deferred until wounds healed.  APTT slightly below goal this AM.  Heparin level still elevated from PTA Eliquis.  Goal of Therapy:  Heparin level 0.3-0.7 units/ml aPTT 66-102 sec  Monitor platelets by anticoagulation protocol: Yes   Plan:  Stop IV heparin. Resume Eliquis 5 mg po BID  [place] Crane, Roylene Reason, [facility] Clinical Pharmacist  06/05/2021 11:05 AM   [facility] pharmacy phone numbers are listed on [email or web address]

## 2021-06-05 NOTE — Progress Notes (Signed)
[facility] Progress Note   Assessment/ Plan:   AKI: most certainly ATN with prolonged downtime and contrast admin as well. No K/ CO2 or volume issues.  Cr from 1.12--> 4.5-->5.76 06/03/21--> 6.22.  Will require close monitoring for need for dialytic intervention.   - UOP picking up and rate of rise of Cr decreasing.  I am encouraged with the increase in UOP but Cr still uptrending- can see Cr lag behind UOP in nascent recovery by a couple of days - volume status OK today- would hold on diuretics still - no indication for dialysis at present and hopefully we can get by without it--> if we see continuously uptrending creatinine without plateau this will change our trajectory and we may have to consider HD - Hold all nephrotoxins including entresto.    Out of hospital cardiac arrest: prolonged downtime.  Cardiology and EP following.  Would ideally do ICD but L arm pressor injury precludes safe insertion now per EP Systolic CHF: EF 77% L shin and L forearm blistering- from peripheral admin of pressors.  Local wound care, plastics has seen. Dispo: remains in ICU    Subjective:    Transferred out of ICU yesterday.  Lying flat in bed.  No complaints   Objective:   BP (!) 143/57 (BP Location: Left Leg)   Pulse 61   Temp 98.6 F (37 C) (Oral)   Resp 18   Ht 6' 1.5" (1.867 m)   Wt 120.8 kg   SpO2 95%   BMI 34.66 kg/m   Physical Exam: GEN nad, lygin flat in bed HEENT EOMI PERRL NECK no overt JVD PULM clear CV RRR ABD soft EXT trace LE edema NEURO AAO x , cognitive slowing but no gross motor deficits SKIN: large area 5x 10 cm l shin with erythema and blistering, dressed with iodoform gauze.  L forearm with 4 x 8 ish area of blistering and erythema--> these are dressed today  Labs: BMET Recent Labs  Lab 06/01/21 0124 06/01/21 0303 06/02/21 0450 06/02/21 1431 06/03/21 0346 06/04/21 0503 06/05/21 0500  NA 140 136 141 136 134* 134* 135  K 3.9 3.6 3.8 4.5 4.5 4.3  4.2  CL  --  110 110 104 104 105 106  CO2  --  18* 18* 20* 20* 19* 19*  GLUCOSE  --  173* 95 137* 131* 161* 113*  BUN  --  34* 45* 53* 58* 64* 73*  CREATININE  --  2.67* 4.51* 5.06* 5.76* 6.22* 6.78*  CALCIUM  --  9.9 7.7* 8.8* 8.6* 8.5* 9.1  PHOS  --  2.8  --   --   --   --   --    CBC Recent Labs  Lab 06/02/21 1607 06/03/21 0346 06/04/21 0503 06/05/21 0500  WBC 14.8* 13.4* 9.9 10.8*  HGB 12.5* 11.8* 11.0* 11.6*  HCT 38.5* 36.4* 33.2* 33.9*  MCV 96.7 95.3 93.8 91.6  PLT 129* 114* 111* 137*      Medications:     amiodarone  400 mg Oral BID   apixaban  5 mg Oral BID   aspirin  81 mg Oral Daily   atorvastatin  80 mg Oral Daily   carvedilol  3.125 mg Oral BID WC   Chlorhexidine Gluconate Cloth  6 each Topical Daily   insulin aspart  0-9 Units Subcutaneous Q4H   mouth rinse  15 mL Mouth Rinse BID   polyethylene glycol  17 g Oral Daily   senna-docusate  1 tablet Oral  BID   sodium chloride flush  10-40 mL Intracatheter Q12H   sodium chloride flush  3 mL Intravenous Q12H    Madelon Lips, MD 06/05/2021, 11:01 AM

## 2021-06-05 NOTE — Progress Notes (Signed)
[facility] PROGRESS NOTE    Ross [name]  WFU:[id number] DOB: 01/23/1956 DOA: 05/31/2021 PCP: Orpah Melter, MD      Brief Narrative:  Ross [name] is a 65 y.o. M with hx AF and 2nd deg AVB with PPM, sCHF EF 25%, stable angina, MO, COPD not on O2, CAD s/pCABG 2007, HTN, and DM who presented with witnessed out of hospital cardiac arrest.    Defibrillated x10, amiodarone 450 and over an hour CPR time.  After which he was able to follow commands, taken to the Cath lab and found to have diffuse disease.   6/11: Cardiac arrest, intubated, underwent left heart cath 6/12: Extubated         Assessment & Plan:  Cardiac arrest Prolonged VT/VF cardiac arrest, out of hospital Initial presenting problem. Left heart cath showed occluded vein to the RCA and occluded vein to the diagonal branch and medical therapy was recommended.  ICD indicated, evaluated by EP who feel that skin necrosis must resolve prior to ICD upgrade.  - Plan for LifeVest at discharge and readdress pacemaker upgrade in the future    Cognitive impairment, likely post-cardiac arrest anoxic injury Patient noted to have processing deficits, memory impairment. -SLP for MMSE    Acute renal failure Metabolic acidosis Cr 2.0-2.5 at baseline.  Here wass 2.6 on admission, trending up now to 6.78 today, UOP good -Strict I/Os -Avoid nephrotoxins -Consult Nephrology, appreciate cares -Trend Cr   Skin necrosis, left arm and left knee Due to extravasation of Levophed -Consult Plastic surgery, they recommend Xeroform daily and clinical monitoring, no plan for excision or surgical debridement, likely outpatient follow up    Orthostasis Patient with severe dizziness with standing or even sitting up right. -CHeck orthostatic vital signs   Paroxysmal atrial fibrillation History 2nd AV block with PPM Rate controlled - Continue amiodarone, apixaban, carvedilol -Plan for amiodarone 400 BID for  2 weeks until Jun 29 then 200 daily  COPD  No active disease -Hold home ICS/LABA/LAMA, resume at discharge  Diabetes Blood sugars well controlled - Continue sliding scale corrections  Hypertension Chronic systolic CHF/ischemic cardiomyopathy Coronary artery disease Appears euvolemic -Continue aspirin, atorvastatin, carvedilol -Hold Entresto, Farxiga until renal function clearer  Possible aspiration pneumonia Completed 5 days Unasyn, leukocytosis and fever resolved. - Stop antibiotics  Anemia of critical illness Mild, no clinical bleeding    Resolved issues: Post cardiac arrest cardiogenic shock Postarrest metabolic acidosis due to lactic acidosis Resolved  Acute respiratory failure with hypoxia due to cardiac arrest Resolved  Hyponatremia ruled out, mild transient low sodium, clinically insignificant          Disposition: Status is: Inpatient  Remains inpatient appropriate because: renal function worsening, unclear disposition for this cognitively impaired patient.  Dispo: The patient is from: Home              Anticipated d/c is to: SNF              Patient currently is not medically stable to d/c.   Difficult to place patient No    Patient admitted for VF arrest, has had impressive recovery has combined renal failure and cognitive impairment.    He is stabilized from a cardiac standpoint, needs EP follow up for ICD and Plastic Surgery follow up for wounds from Levophed and Cards follow up for CHF.  Once his renal function starts to improve and nephrology feel that he will stable and not need dialysis, we will pursue rehabilitation placement.  Level of care: Telemetry Cardiac       MDM: The below labs and imaging reports were reviewed and summarized above.  Medication management as above.  This is a severe illnes with threat to life.     DVT prophylaxis: SCD's Start: 05/31/21 2150 apixaban (ELIQUIS) tablet 5 mg  Code Status:  FULL         Subjective: Has chest pain in the sternum, no dyspnea, sputum, fever.  He has some cognitive impairment and he is somewhat anxious and tangential.  He has severe orthostasis symptoms.  Objective: Vitals:   06/05/21 0808 06/05/21 1103 06/05/21 1631 06/05/21 1706  BP: (!) 143/57 (!) 163/61 (!) 153/64   Pulse: 61 61 (!) 58 61  Resp: 18 16    Temp: 98.6 F (37 C) 98.6 F (37 C)    TempSrc: Oral Oral    SpO2: 95% 94% 95%   Weight:      Height:        Intake/Output Summary (Last 24 hours) at 06/05/2021 1753 Last data filed at 06/05/2021 1311 Gross per 24 hour  Intake 674.56 ml  Output 1550 ml  Net -875.44 ml   Filed Weights   05/31/21 2200 06/05/21 0445  Weight: 109.5 kg 120.8 kg    Examination: General appearance:  adult male, alert and in no obvious distress.   HEENT: Anicteric, conjunctiva pink, lids and lashes normal. No nasal deformity, discharge, epistaxis.  Lips moist.   Skin: Warm and dry.  no jaundice.  No suspicious rashes or lesions. Cardiac: RRR, nl L8-X2, soft systolic murmur.  No lower extremity edema.  Severe precordial pain to palpation.  Respiratory: Normal respiratory rate and rhythm.  CTAB without rales or wheezes. Abdomen: Abdomen soft.  No TTP or guarding. No ascites, distension, hepatosplenomegaly.   MSK: Left leg contracture due to posterior knee wound.  Left arm wrapped, no surrounding cellulitis. Neuro: Awake and alert.  EOMI, moves all extremities neurolysed weakness. Speech fluent.    Psych: Sensorium intact and responding to questions, attention normal. Affect blunted.  Judgment and insight appear impaired.    Data Reviewed: I have personally reviewed following labs and imaging studies:  CBC: Recent Labs  Lab 06/02/21 0450 06/02/21 1607 06/03/21 0346 06/04/21 0503 06/05/21 0500  WBC 14.2* 14.8* 13.4* 9.9 10.8*  HGB 11.6* 12.5* 11.8* 11.0* 11.6*  HCT 35.8* 38.5* 36.4* 33.2* 33.9*  MCV 96.5 96.7 95.3 93.8 91.6  PLT 120*  129* 114* 111* 119*   Basic Metabolic Panel: Recent Labs  Lab 06/01/21 0303 06/02/21 0450 06/02/21 1431 06/03/21 0346 06/04/21 0503 06/05/21 0500  NA 136 141 136 134* 134* 135  K 3.6 3.8 4.5 4.5 4.3 4.2  CL 110 110 104 104 105 106  CO2 18* 18* 20* 20* 19* 19*  GLUCOSE 173* 95 137* 131* 161* 113*  BUN 34* 45* 53* 58* 64* 73*  CREATININE 2.67* 4.51* 5.06* 5.76* 6.22* 6.78*  CALCIUM 9.9 7.7* 8.8* 8.6* 8.5* 9.1  MG 3.6*  --   --   --   --   --   PHOS 2.8  --   --   --   --   --    GFR: Estimated Creatinine Clearance: 14.7 mL/min (A) (by C-G formula based on SCr of 6.78 mg/dL (H)). Liver Function Tests: No results for input(s): AST, ALT, ALKPHOS, BILITOT, PROT, ALBUMIN in the last 168 hours. No results for input(s): LIPASE, AMYLASE in the last 168 hours. No results for input(s): AMMONIA in the  last 168 hours. Coagulation Profile: No results for input(s): INR, PROTIME in the last 168 hours. Cardiac Enzymes: No results for input(s): CKTOTAL, CKMB, CKMBINDEX, TROPONINI in the last 168 hours. BNP (last 3 results) No results for input(s): PROBNP in the last 8760 hours. HbA1C: No results for input(s): HGBA1C in the last 72 hours. CBG: Recent Labs  Lab 06/05/21 0018 06/05/21 0441 06/05/21 0806 06/05/21 1123 06/05/21 1637  GLUCAP 103* 120* 100* 127* 104*   Lipid Profile: No results for input(s): CHOL, HDL, LDLCALC, TRIG, CHOLHDL, LDLDIRECT in the last 72 hours. Thyroid Function Tests: No results for input(s): TSH, T4TOTAL, FREET4, T3FREE, THYROIDAB in the last 72 hours. Anemia Panel: No results for input(s): VITAMINB12, FOLATE, FERRITIN, TIBC, IRON, RETICCTPCT in the last 72 hours. Urine analysis:    Component Value Date/Time   COLORURINE YELLOW 06/02/2021 1431   APPEARANCEUR CLOUDY (A) 06/02/2021 1431   LABSPEC 1.020 06/02/2021 1431   PHURINE 5.0 06/02/2021 1431   GLUCOSEU 150 (A) 06/02/2021 1431   HGBUR LARGE (A) 06/02/2021 1431   BILIRUBINUR NEGATIVE 06/02/2021 1431    KETONESUR NEGATIVE 06/02/2021 1431   PROTEINUR 100 (A) 06/02/2021 1431   NITRITE NEGATIVE 06/02/2021 1431   LEUKOCYTESUR LARGE (A) 06/02/2021 1431   Sepsis Labs: @LABRCNTIP (procalcitonin:4,lacticacidven:4)  ) Recent Results (from the past 240 hour(s))  C Difficile Quick Screen w PCR reflex     Status: None   Collection Time: 06/01/21 10:22 AM   Specimen: STOOL  Result Value Ref Range Status   C Diff antigen NEGATIVE NEGATIVE Final   C Diff toxin NEGATIVE NEGATIVE Final   C Diff interpretation No C. difficile detected.  Final    Comment: Performed at [facility], [place] [address], [place], [place] [address]  MRSA Next Gen by PCR, Nasal     Status: None   Collection Time: 06/02/21  8:30 AM  Result Value Ref Range Status   MRSA by PCR Next Gen NOT DETECTED NOT DETECTED Final    Comment: (NOTE) The GeneXpert MRSA Assay (FDA approved for NASAL specimens only), is one component of a comprehensive MRSA colonization surveillance program. It is not intended to diagnose MRSA infection nor to guide or monitor treatment for MRSA infections. Test performance is not FDA approved in patients less than 53 years old. Performed at [facility], [place] [address], [place], [place] [address]   Culture, Urine     Status: None   Collection Time: 06/02/21  2:31 PM   Specimen: Urine, Catheterized  Result Value Ref Range Status   Specimen Description URINE, CATHETERIZED  Final   Special Requests NONE  Final   Culture   Final    NO GROWTH Performed at [facility], [address] [place], [place] [address]    Report Status 06/03/2021 FINAL  Final  Culture, blood (routine x 2)     Status: None (Preliminary result)   Collection Time: 06/02/21  4:59 PM   Specimen: BLOOD RIGHT HAND  Result Value Ref Range Status   Specimen Description BLOOD RIGHT HAND  Final   Special Requests   Final    BOTTLES DRAWN AEROBIC ONLY Blood Culture results may not be optimal due to an inadequate  volume of blood received in culture bottles   Culture   Final    NO GROWTH 3 DAYS Performed at [facility], [place] [address], [place], [place] [address]    Report Status PENDING  Incomplete  Culture, blood (routine x 2)  Status: None (Preliminary result)   Collection Time: 06/02/21  5:08 PM   Specimen: BLOOD RIGHT HAND  Result Value Ref Range Status   Specimen Description BLOOD RIGHT HAND  Final   Special Requests   Final    BOTTLES DRAWN AEROBIC ONLY Blood Culture results may not be optimal due to an inadequate volume of blood received in culture bottles   Culture   Final    NO GROWTH 3 DAYS Performed at [facility], [place] [address], [place], [place] [address]    Report Status PENDING  Incomplete  SARS CORONAVIRUS 2 (TAT 6-24 HRS) Nasopharyngeal Nasopharyngeal Swab     Status: None   Collection Time: 06/04/21  4:02 PM   Specimen: Nasopharyngeal Swab  Result Value Ref Range Status   SARS Coronavirus 2 NEGATIVE NEGATIVE Final    Comment: (NOTE) SARS-CoV-2 target nucleic acids are NOT DETECTED.  The SARS-CoV-2 RNA is generally detectable in upper and lower respiratory specimens during the acute phase of infection. Negative results do not preclude SARS-CoV-2 infection, do not rule out co-infections with other pathogens, and should not be used as the sole basis for treatment or other patient management decisions. Negative results must be combined with clinical observations, patient history, and epidemiological information. The expected result is Negative.  Fact Sheet for Patients: SugarRoll.be  Fact Sheet for Healthcare Providers: https://www.woods-mathews.com/  This test is not yet approved or cleared by the [place] FDA and  has been authorized for detection and/or diagnosis of SARS-CoV-2 by FDA under an Emergency Use Authorization (EUA). This EUA will remain  in effect (meaning this test can be used) for the  duration of the COVID-19 declaration under Se ction 564(b)(1) of the Act, 21 U.S.C. section 360bbb-3(b)(1), unless the authorization is terminated or revoked sooner.  Performed at [facility], [place] [address], [place], [place] [address]          Radiology Studies: CT HEAD WO CONTRAST  Result Date: 06/03/2021 CLINICAL DATA:  Anoxic brain injury EXAM: CT HEAD WITHOUT CONTRAST TECHNIQUE: Contiguous axial images were obtained from the base of the skull through the vertex without intravenous contrast. COMPARISON:  None. FINDINGS: Brain: There is no mass, hemorrhage or extra-axial collection. The size and configuration of the ventricles and extra-axial CSF spaces are normal. The brain parenchyma is normal, without acute or chronic infarction. Vascular: No abnormal hyperdensity of the major intracranial arteries or dural venous sinuses. No intracranial atherosclerosis. Skull: The visualized skull base, calvarium and extracranial soft tissues are normal. Sinuses/Orbits: No fluid levels or advanced mucosal thickening of the visualized paranasal sinuses. No mastoid or middle ear effusion. The orbits are normal. IMPRESSION: Normal head CT. Electronically Signed   By: Ulyses Jarred M.D.   On: 06/03/2021 22:42        Scheduled Meds:  amiodarone  400 mg Oral BID   apixaban  5 mg Oral BID   aspirin  81 mg Oral Daily   atorvastatin  80 mg Oral Daily   carvedilol  3.125 mg Oral BID WC   Chlorhexidine Gluconate Cloth  6 each Topical Daily   [START ON 06/06/2021] insulin aspart  0-9 Units Subcutaneous TID WC   mouth rinse  15 mL Mouth Rinse BID   polyethylene glycol  17 g Oral Daily   senna-docusate  1 tablet Oral BID   sodium chloride flush  10-40 mL Intracatheter Q12H   sodium chloride flush  3 mL Intravenous Q12H   Continuous Infusions:  sodium  chloride     sodium chloride     ampicillin-sulbactam (UNASYN) IV Stopped (06/05/21 1209)     LOS: 5 days    Time spent: 35  minutes    Edwin Dada, MD [facility] 06/05/2021, 5:53 PM     Please page though [place] or Epic secure chat:  For [employer], Adult nurse

## 2021-06-06 DIAGNOSIS — I4819 Other persistent atrial fibrillation: Secondary | ICD-10-CM | POA: Diagnosis not present

## 2021-06-06 DIAGNOSIS — I469 Cardiac arrest, cause unspecified: Secondary | ICD-10-CM | POA: Diagnosis not present

## 2021-06-06 DIAGNOSIS — I255 Ischemic cardiomyopathy: Secondary | ICD-10-CM | POA: Diagnosis not present

## 2021-06-06 DIAGNOSIS — Z951 Presence of aortocoronary bypass graft: Secondary | ICD-10-CM | POA: Diagnosis not present

## 2021-06-06 LAB — CBC
HCT: 34.6 % — ABNORMAL LOW (ref 39.0–52.0)
Hemoglobin: 11.7 g/dL — ABNORMAL LOW (ref 13.0–17.0)
MCH: 30.9 pg (ref 26.0–34.0)
MCHC: 33.8 g/dL (ref 30.0–36.0)
MCV: 91.3 fL (ref 80.0–100.0)
Platelets: 160 10*3/uL (ref 150–400)
RBC: 3.79 MIL/uL — ABNORMAL LOW (ref 4.22–5.81)
RDW: 13.3 % (ref 11.5–15.5)
WBC: 10.3 10*3/uL (ref 4.0–10.5)
nRBC: 0 % (ref 0.0–0.2)

## 2021-06-06 LAB — BASIC METABOLIC PANEL
Anion gap: 11 (ref 5–15)
BUN: 77 mg/dL — ABNORMAL HIGH (ref 8–23)
CO2: 19 mmol/L — ABNORMAL LOW (ref 22–32)
Calcium: 9.6 mg/dL (ref 8.9–10.3)
Chloride: 105 mmol/L (ref 98–111)
Creatinine, Ser: 6.32 mg/dL — ABNORMAL HIGH (ref 0.61–1.24)
GFR, Estimated: 9 mL/min — ABNORMAL LOW (ref >60)
Glucose, Bld: 109 mg/dL — ABNORMAL HIGH (ref 70–99)
Potassium: 4.1 mmol/L (ref 3.5–5.1)
Sodium: 135 mmol/L (ref 135–145)

## 2021-06-06 LAB — GLUCOSE, CAPILLARY
Glucose-Capillary: 106 mg/dL — ABNORMAL HIGH (ref 70–99)
Glucose-Capillary: 86 mg/dL (ref 70–99)
Glucose-Capillary: 106 mg/dL — ABNORMAL HIGH (ref 70–99)
Glucose-Capillary: 110 mg/dL — ABNORMAL HIGH (ref 70–99)

## 2021-06-06 LAB — APTT: aPTT: 37 seconds — ABNORMAL HIGH (ref 24–36)

## 2021-06-06 MED ORDER — GUAIFENESIN 100 MG/5ML PO SOLN
5.0000 mL | ORAL | Status: DC | PRN
  Administered 2021-06-06: 100 mg via ORAL
  Filled 2021-06-06: qty 5

## 2021-06-06 NOTE — Progress Notes (Signed)
OT Cancellation Note  Patient Details Name: Ross [name] MRN: [id number] DOB: 05-12-1956   Cancelled Treatment:    Reason Eval/Treat Not Completed: Other (comment)- pt declined, eating lunch at this time. Will follow and see as able.   Jolaine Artist, OT Acute Rehabilitation Services Pager [phone] Office [phone]   Delight Stare 06/06/2021, 12:07 PM
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
## 2021-06-06 NOTE — Progress Notes (Signed)
  [facility] Progress Note   Assessment/ Plan:   AKI: most certainly ATN with prolonged downtime and contrast admin as well. No K/ CO2 or volume issues.  Cr from 1.12--> 4.5-->5.76 06/03/21--> 6.22.  Will require close monitoring for need for dialytic intervention.   - UOP picking up and Cr peaked at 6.8, finally decreasing 6.3.  UOP great.  D/c Foley.   - volume status OK today- would hold on diuretics still - no need for dialysis.  Expect continued improvement. - Hold all nephrotoxins including entresto.    Out of hospital cardiac arrest: prolonged downtime.  Cardiology and EP following.  Would ideally do ICD but L arm pressor injury precludes safe insertion now per EP Systolic CHF: EF 56% L shin and L forearm blistering- from peripheral admin of pressors.  Local wound care, plastics has seen. Dispo: remains in ICU    Subjective:   Cr downtrending finally- good UOP.     Objective:   BP (!) 169/67 (BP Location: Right Leg)   Pulse 60   Temp 98.7 F (37.1 C) (Oral)   Resp 18   Ht 6' 1.5" (1.867 m)   Wt 117.4 kg   SpO2 94%   BMI 33.68 kg/m   Physical Exam: GEN nad, lygin flat in bed HEENT EOMI PERRL NECK no overt JVD PULM clear CV RRR ABD soft EXT trace LE edema NEURO AAO x , cognitive slowing but no gross motor deficits SKIN: large area 5x 10 cm l shin with erythema and blistering, dressed with iodoform gauze.  L forearm with 4 x 8 ish area of blistering and erythema--> these are dressed today  Labs: BMET Recent Labs  Lab 06/01/21 0303 06/02/21 0450 06/02/21 1431 06/03/21 0346 06/04/21 0503 06/05/21 0500 06/06/21 0156  NA 136 141 136 134* 134* 135 135  K 3.6 3.8 4.5 4.5 4.3 4.2 4.1  CL 110 110 104 104 105 106 105  CO2 18* 18* 20* 20* 19* 19* 19*  GLUCOSE 173* 95 137* 131* 161* 113* 109*  BUN 34* 45* 53* 58* 64* 73* 77*  CREATININE 2.67* 4.51* 5.06* 5.76* 6.22* 6.78* 6.32*  CALCIUM 9.9 7.7* 8.8* 8.6* 8.5* 9.1 9.6  PHOS 2.8  --   --   --   --    --   --    CBC Recent Labs  Lab 06/03/21 0346 06/04/21 0503 06/05/21 0500 06/06/21 0156  WBC 13.4* 9.9 10.8* 10.3  HGB 11.8* 11.0* 11.6* 11.7*  HCT 36.4* 33.2* 33.9* 34.6*  MCV 95.3 93.8 91.6 91.3  PLT 114* 111* 137* 160      Medications:     amiodarone  400 mg Oral BID   apixaban  5 mg Oral BID   aspirin  81 mg Oral Daily   atorvastatin  80 mg Oral Daily   carvedilol  3.125 mg Oral BID WC   insulin aspart  0-9 Units Subcutaneous TID WC   mouth rinse  15 mL Mouth Rinse BID   polyethylene glycol  17 g Oral Daily   senna-docusate  1 tablet Oral BID   sodium chloride flush  10-40 mL Intracatheter Q12H   sodium chloride flush  3 mL Intravenous Q12H    Madelon Lips, MD 06/06/2021, 11:23 AM

## 2021-06-06 NOTE — Progress Notes (Signed)
Physical Therapy Treatment Patient Details Name: Ross [name] MRN: [id number] DOB: 12-27-1955 Today's Date: 06/06/2021    History of Present Illness 65 yo admitted 6/11 after Vfib cardiac arrest at home with greater than an hour resuscitation and 10+ shocks, cath demonstrating severe CAD. Extubated 6/12. L UE/LE skin necrosis- wounds dressed by plastic surgery. PMhx: cardiomyopathy, CABG, CHF, DM, HTN, PPM, Afib, obesity, neuropathy.    PT Comments    Pt demonstrates improved capacity to perform bed mobility and transfers at this time, although activity tolerance remains limited. Pt expresses concerns about ambulating due to a fear of syncopal/pre-syncopal episode, PT attempts to provide education on safety measures to reduce the risk of this event however pt continues to decline. Pt continues to demonstrate slowed processing and impaired attention, often needing repeated cues to attend to task. PT continues to recommend CIR placement at this time.   Follow Up Recommendations  CIR;Supervision/Assistance - 24 hour     Equipment Recommendations  Rolling walker with 5" wheels    Recommendations for Other Services       Precautions / Restrictions Precautions Precautions: Fall Precaution Comments: monitor BP/orthostatic symptoms Restrictions Weight Bearing Restrictions: No    Mobility  Bed Mobility Overal bed mobility: Needs Assistance Bed Mobility: Rolling;Sidelying to Sit Rolling: Min assist Sidelying to sit: Min assist;HOB elevated       General bed mobility comments: use of railing    Transfers Overall transfer level: Needs assistance Equipment used: None Transfers: Squat Pivot Transfers     Squat pivot transfers: Min assist     General transfer comment: pt requiring assistance for safety and to guide hips during transfer  Ambulation/Gait Ambulation/Gait assistance:  (pt declines attempts at ambulation at this time due to fear of syncopal episode/dizziness.)                Stairs             Wheelchair Mobility    Modified Rankin (Stroke Patients Only)       Balance Overall balance assessment: Needs assistance Sitting-balance support: No upper extremity supported;Feet supported Sitting balance-Leahy Scale: Fair     Standing balance support:  (pt does not come to erect standing posture during session)                                Cognition Arousal/Alertness: Awake/alert Behavior During Therapy: WFL for tasks assessed/performed (flat at times) Overall Cognitive Status: Impaired/Different from baseline Area of Impairment: Following commands;Problem solving;Attention                   Current Attention Level: Sustained   Following Commands: Follows one step commands with increased time     Problem Solving: Slow processing;Requires verbal cues;Requires tactile cues        Exercises General Exercises - Lower Extremity Ankle Circles/Pumps: AROM;Both Gluteal Sets: AROM;Both Long Arc Quad: AROM;Both Hip ABduction/ADduction: AROM;Both    General Comments General comments (skin integrity, edema, etc.): VSS on RA      Pertinent Vitals/Pain Pain Assessment: Faces Faces Pain Scale: Hurts even more Pain Location: chest, LUE and LLE wounds, back wound Pain Descriptors / Indicators: Aching Pain Intervention(s): Monitored during session    Home Living       Type of Home: House              Prior Function            PT  Goals (current goals can now be found in the care plan section) Acute Rehab PT Goals Patient Stated Goal: to return to independence Progress towards PT goals: Progressing toward goals    Frequency    Min 3X/week      PT Plan Current plan remains appropriate    Co-evaluation              AM-PAC PT "6 Clicks" Mobility   Outcome Measure  Help needed turning from your back to your side while in a flat bed without using bedrails?: A Little Help needed  moving from lying on your back to sitting on the side of a flat bed without using bedrails?: A Little Help needed moving to and from a bed to a chair (including a wheelchair)?: A Little Help needed standing up from a chair using your arms (e.g., wheelchair or bedside chair)?: A Little Help needed to walk in hospital room?: A Lot Help needed climbing 3-5 steps with a railing? : A Lot 6 Click Score: 16    End of Session   Activity Tolerance: Patient tolerated treatment well Patient left: in chair;with call bell/phone within reach;with chair alarm set Nurse Communication: Mobility status PT Visit Diagnosis: Unsteadiness on feet (R26.81);Other abnormalities of gait and mobility (R26.89);Difficulty in walking, not elsewhere classified (R26.2);Pain Pain - part of body: Arm;Leg     Time: 1115-1150 PT Time Calculation (min) (ACUTE ONLY): 35 min  Charges:  $Therapeutic Activity: 23-37 mins                     Zenaida Niece, PT, DPT Acute Rehabilitation Pager: [phone]    Zenaida Niece 06/06/2021, 12:34 PM

## 2021-06-06 NOTE — Plan of Care (Signed)

## 2021-06-06 NOTE — TOC Progression Note (Addendum)
Transition of Care [facility]) - Progression Note    Patient Details  Name: Ross [name] MRN: [id number] Date of Birth: [birth year]  [facility]) CM/SW Contact  Zenon Mayo, RN Phone Number: 06/06/2021, 4:04 PM  Clinical Narrative:    from ICU, vfib arrest, need life vest, wound to left leg and left arm. Conts on iv abx,hep drip, cret elevated, nephrology following, CIR following. TOC will cont to follow for dc needs.        Expected Discharge Plan and Services                                                 Social Determinants of Health (SDOH) Interventions    Readmission Risk Interventions No flowsheet data found.

## 2021-06-06 NOTE — Progress Notes (Signed)
   06/06/21 2001  Assess: MEWS Score  Temp 99.5 F (37.5 C)  BP (!) 157/66  Pulse Rate (!) 59  Resp 18  SpO2 95 %  Assess: MEWS Score  MEWS Temp 0  MEWS Systolic 0  MEWS Pulse 0  MEWS RR 0  MEWS LOC 0  MEWS Score 0  MEWS Score Color Green  Pt resting in bed comfortably at this time.

## 2021-06-06 NOTE — Progress Notes (Signed)
Progress Note  Patient Name: Ross [name] Date of Encounter: 06/06/2021  [facility] Cardiologist: Sinclair Grooms, MD   Subjective   Patient alert and communicative.  Oriented but occasionally somewhat confused.   Denies chest pain or shortness of breath today.  Inpatient Medications    Scheduled Meds:  amiodarone  400 mg Oral BID   apixaban  5 mg Oral BID   aspirin  81 mg Oral Daily   atorvastatin  80 mg Oral Daily   carvedilol  3.125 mg Oral BID WC   insulin aspart  0-9 Units Subcutaneous TID WC   mouth rinse  15 mL Mouth Rinse BID   polyethylene glycol  17 g Oral Daily   senna-docusate  1 tablet Oral BID   sodium chloride flush  10-40 mL Intracatheter Q12H   sodium chloride flush  3 mL Intravenous Q12H   Continuous Infusions:  sodium chloride     sodium chloride     PRN Meds: sodium chloride, acetaminophen, ALPRAZolam, guaiFENesin, ipratropium-albuterol, ondansetron (ZOFRAN) IV, oxyCODONE-acetaminophen, sodium chloride flush, sodium chloride flush   Vital Signs    Vitals:   06/06/21 0527 06/06/21 0530 06/06/21 0546 06/06/21 0835  BP:  (!) 160/71 (!) 162/61 (!) 169/67  Pulse:   (!) 59 60  Resp:   18   Temp:   98.7 F (37.1 C)   TempSrc:   Oral   SpO2:   94%   Weight: 117.4 kg     Height:        Intake/Output Summary (Last 24 hours) at 06/06/2021 1031 Last data filed at 06/06/2021 0844 Gross per 24 hour  Intake 1034.56 ml  Output 2275 ml  Net -1240.44 ml   Last 3 Weights 06/06/2021 06/05/2021 05/31/2021  Weight (lbs) 258 lb 13.1 oz 266 lb 5.1 oz 241 lb 6.5 oz  Weight (kg) 117.4 kg 120.8 kg 109.5 kg      Telemetry    Underlying rhythm A. fib, V paced- personally Reviewed  ECG    None performed today  Personally Reviewed  Physical Exam   GEN: No acute distress.   Neck: No JVD Cardiac: RRR, no murmurs, rubs, or gallops.  Respiratory: Clear to auscultation bilaterally. GI: Soft, nontender, non-distended  MS: No edema; No deformity. Neuro:   Nonfocal  Psych: Normal affect   Labs    High Sensitivity Troponin:  No results for input(s): TROPONINIHS in the last 720 hours.    Chemistry Recent Labs  Lab 06/04/21 0503 06/05/21 0500 06/06/21 0156  NA 134* 135 135  K 4.3 4.2 4.1  CL 105 106 105  CO2 19* 19* 19*  GLUCOSE 161* 113* 109*  BUN 64* 73* 77*  CREATININE 6.22* 6.78* 6.32*  CALCIUM 8.5* 9.1 9.6  GFRNONAA 9* 8* 9*  ANIONGAP 10 10 11      Hematology Recent Labs  Lab 06/04/21 0503 06/05/21 0500 06/06/21 0156  WBC 9.9 10.8* 10.3  RBC 3.54* 3.70* 3.79*  HGB 11.0* 11.6* 11.7*  HCT 33.2* 33.9* 34.6*  MCV 93.8 91.6 91.3  MCH 31.1 31.4 30.9  MCHC 33.1 34.2 33.8  RDW 13.3 13.3 13.3  PLT 111* 137* 160    BNPNo results for input(s): BNP, PROBNP in the last 168 hours.   DDimer No results for input(s): DDIMER in the last 168 hours.   Radiology    No results found.  Cardiac Studies   2D echocardiogram (06/01/2021)  IMPRESSIONS     1. Left ventricular ejection fraction, by estimation,  is 35 to 40%. The  left ventricle has moderately decreased function. The left ventricle has  no regional wall motion abnormalities. There is severe concentric left  ventricular hypertrophy. Left  ventricular diastolic parameters are indeterminate. Elevated left  ventricular end-diastolic pressure. There is akinesis of the left  ventricular, mid anteroseptal wall. There is akinesis of the left  ventricular, apical septal wall, inferior wall and  anterior wall. There is akinesis of the left ventricular, apical segment.   2. Right ventricular systolic function was not well visualized. The right  ventricular size is mildly enlarged. Tricuspid regurgitation signal is  inadequate for assessing PA pressure.   3. Right atrial size was severely dilated.   4. The mitral valve is normal in structure. Trivial mitral valve  regurgitation. No evidence of mitral stenosis.   5. The aortic valve is tricuspid. Aortic valve regurgitation  is not  visualized. Mild aortic valve sclerosis is present, with no evidence of  aortic valve stenosis.   6. Aortic dilatation noted. There is mild dilatation of the aortic root,  measuring 41 mm.   7. The inferior vena cava is dilated in size with >50% respiratory  variability, suggesting right atrial pressure of 8 mmHg.  Cardiac catheterization (06/01/2019)  Conclusion    Prox RCA to Mid RCA lesion is 99% stenosed. Dist RCA lesion is 80% stenosed. Ramus lesion is 100% stenosed. Origin to Prox Graft lesion is 100% stenosed. Origin lesion is 100% stenosed. Prox LAD lesion is 70% stenosed. Prox LAD to Mid LAD lesion is 70% stenosed. Mid LAD-1 lesion is 90% stenosed. Mid LAD-2 lesion is 80% stenosed.   VF cardiac arrest with prolonged resuscitative effort and ultimate restoration of paced rhythm following multiple shocks, amiodarone, and Levophed infusion.   Severe native CAD with diffuse severe proximal LAD stenoses of 70 to 80% with focal 90% stenosis between the first and second septal perforating artery with total occlusion of the first diagonal vessel and a flush and fill phenomena of the mid LAD secondary to competitive filling via the LIMA graft.   Old occlusion of the ramus immediate vessel and obtuse marginal vessel   Dominant RCA with previously noted  diffuse 90% mid stenosis which now has right to right collateralization and diffuse 80% distal stenosis.   Patent LIMA graft supplying the mid LAD.   Patent SVG supplying the first diagonal vessel.   Old occlusion of the vein graft to a ramus intermediate vessel.   Old occlusion of the vein graft which had supplied the distal RCA.   LVEDP 20 mmHg   RECOMMENDATION: Patient is admitted to the critical care service.  We will plan 2D echo Doppler study in a.m.  He has been seen by Dr. Rayann Heman in the emergency room during his prolonged resuscitative efforts today.  Guideline directed medical therapy for CHF.  Anticipate ICD  implantation per electrophysiology.  Coronary Diagrams   Diagnostic Dominance: Right    Intervention     Patient Profile     Ross [name] is a 65 y.o. male with known ischemic CM (EF 25%), prior CABG, permanent afib, and DM who is admitted with VF arrest.  Assessment & Plan    1: Status post VF arrest-patient had witnessed cardiac arrest, defibrillation x10 and CPR with ROSC.  He was intubated and transported to [facility].  He currently has a paced rhythm.  IV amiodarone has been converted to p.o. load.  He has been extubated and is hemodynamically stable.  Amazingly,  he is neurologically intact so with only mild mild intermittent confusion.  2: Ischemic cardiomyopathy-EF in the 30% range in the past and currently 35 to 40% by 2D echo.  He was on guideline directed optimal medical therapy prior to admission followed by Dr. Tamala Julian.  Currently all meds are on hold.  These will be reinstituted depending on his clinical improvement in renal status.  Cardiac catheterization performed by Dr. Claiborne Billings on 05/31/2021 at the time of presentation showed a patent vein to ramus branch, patent LIMA to the LAD, occluded vein to the RCA with occluded native RCA and occluded vein to a diagonal branch.  Medical therapy was recommended for this.  There were no "culprit lesions".  We started the carvedilol 3.25 mg p.o. twice daily which he  is tolerating.  We will keep at this dose for the time being and titrate based on his hemodynamics  3: Atrial fibrillation-history of permanent A. fib on Eliquis oral anticoagulation as an outpatient.  Currently he appears to have gone back in A. fib and is V paced.  Have transition back to p.o. Eliquis.  4: Secondary AV block-history of permanent transvenous pacemaker placed in the past for this.  [employer] to interrogate.  Plan for CRT upgrade at some point in the future according to Dr. Curt Bears although now given his skin necrosis with the need for plastic  surgical intervention and underlying infection the plan is to ultimately send him home on a LifeVest and readdress pacemaker upgrade in the future.  5: Acute renal insufficiency-serum creatinine increased from 1.2-6.7 probably related to ATN from his acute event.  His creatinine has trended down to 6.3 this morning.  He is making urine.  Dr. Hollie Salk from the renal service is following along and at this point does not feel that he requires dialytic therapy..  6: Left upper extremity skin necrosis-probably related to IV infiltration of his norepinephrine.  Dr. Curt Bears has consulted plastic surgery for potential surgical intervention.  Patient was seen by plastic surgery and his wounds were dressed and wrapped.  They are following.  Patient continues to improve.  He remains hemodynamically stable.  I he transition from IV heparin to Eliquis oral anticoagulation.  He was begun on p.o. amiodarone which she will get 400 mg p.o. twice daily for the next 2 weeks per EP.  He will need OT and PT as well.  I suspect he will need transitioning home through a rehab facility.     For questions or updates, please contact [place] Please consult [email or web address] for contact info under        Signed, Quay Burow, MD  06/06/2021, 10:31 AM

## 2021-06-07 DIAGNOSIS — I4819 Other persistent atrial fibrillation: Secondary | ICD-10-CM | POA: Diagnosis not present

## 2021-06-07 DIAGNOSIS — I255 Ischemic cardiomyopathy: Secondary | ICD-10-CM | POA: Diagnosis not present

## 2021-06-07 DIAGNOSIS — I469 Cardiac arrest, cause unspecified: Secondary | ICD-10-CM | POA: Diagnosis not present

## 2021-06-07 DIAGNOSIS — Z951 Presence of aortocoronary bypass graft: Secondary | ICD-10-CM | POA: Diagnosis not present

## 2021-06-07 LAB — URINALYSIS, ROUTINE W REFLEX MICROSCOPIC
Bacteria, UA: NONE SEEN
Bilirubin Urine: NEGATIVE
Glucose, UA: 50 mg/dL — AB
Ketones, ur: NEGATIVE mg/dL
Leukocytes,Ua: NEGATIVE
Nitrite: NEGATIVE
Protein, ur: NEGATIVE mg/dL
Specific Gravity, Urine: 1.012 (ref 1.005–1.030)
pH: 5 (ref 5.0–8.0)

## 2021-06-07 LAB — CBC
HCT: 35.9 % — ABNORMAL LOW (ref 39.0–52.0)
Hemoglobin: 11.9 g/dL — ABNORMAL LOW (ref 13.0–17.0)
MCH: 30.7 pg (ref 26.0–34.0)
MCHC: 33.1 g/dL (ref 30.0–36.0)
MCV: 92.8 fL (ref 80.0–100.0)
Platelets: 203 10*3/uL (ref 150–400)
RBC: 3.87 MIL/uL — ABNORMAL LOW (ref 4.22–5.81)
RDW: 13.3 % (ref 11.5–15.5)
WBC: 10.1 10*3/uL (ref 4.0–10.5)
nRBC: 0 % (ref 0.0–0.2)

## 2021-06-07 LAB — BASIC METABOLIC PANEL
Anion gap: 10 (ref 5–15)
BUN: 77 mg/dL — ABNORMAL HIGH (ref 8–23)
CO2: 20 mmol/L — ABNORMAL LOW (ref 22–32)
Calcium: 9.9 mg/dL (ref 8.9–10.3)
Chloride: 105 mmol/L (ref 98–111)
Creatinine, Ser: 5.7 mg/dL — ABNORMAL HIGH (ref 0.61–1.24)
GFR, Estimated: 10 mL/min — ABNORMAL LOW (ref >60)
Glucose, Bld: 79 mg/dL (ref 70–99)
Potassium: 4 mmol/L (ref 3.5–5.1)
Sodium: 135 mmol/L (ref 135–145)

## 2021-06-07 LAB — CULTURE, BLOOD (ROUTINE X 2)
Culture: NO GROWTH
Culture: NO GROWTH

## 2021-06-07 LAB — GLUCOSE, CAPILLARY
Glucose-Capillary: 85 mg/dL (ref 70–99)
Glucose-Capillary: 110 mg/dL — ABNORMAL HIGH (ref 70–99)
Glucose-Capillary: 122 mg/dL — ABNORMAL HIGH (ref 70–99)
Glucose-Capillary: 90 mg/dL (ref 70–99)
Glucose-Capillary: 126 mg/dL — ABNORMAL HIGH (ref 70–99)

## 2021-06-07 LAB — APTT: aPTT: 33 seconds (ref 24–36)

## 2021-06-07 MED ORDER — ISOSORB DINITRATE-HYDRALAZINE 20-37.5 MG PO TABS
1.0000 | ORAL_TABLET | Freq: Three times a day (TID) | ORAL | Status: DC
  Administered 2021-06-07 – 2021-06-10 (×10): 1 via ORAL
  Filled 2021-06-07 (×10): qty 1

## 2021-06-07 NOTE — Plan of Care (Signed)

## 2021-06-07 NOTE — Progress Notes (Signed)
[facility] PROGRESS NOTE    Ross [name]  VVO:[id number] DOB: 15-Mar-1956 DOA: 05/31/2021 PCP: Orpah Melter, MD      Brief Narrative:  Ross [name] is a 65 y.o. M with hx AF and 2nd deg AVB with PPM, sCHF EF 25%, stable angina, MO, COPD not on O2, CAD s/pCABG 2007, HTN, and DM who presented with witnessed out of hospital cardiac arrest.    Defibrillated x10, amiodarone 450 and over an hour CPR time.  After which he was able to follow commands, taken to the Cath lab and found to have diffuse disease.   6/11: Cardiac arrest, intubated, underwent left heart cath 6/12: Extubated      Subjective: The patient was seen and examined this morning, stable no acute distress, denies any chest pain or shortness of breath. No major events overnight. Reporting of dried blood around the genital area, reporting he did not notice any frank blood in his urine.     Assessment & Plan:  Status post V. fib cardiac arrest Prolonged VT/VF cardiac arrest, out of hospital Initial presenting problem. Remained stable denies any chest pain or shortness of breath  Left heart cath on 05/31/2021 -showed occluded vein to the RCA and occluded vein to the diagonal branch and medical therapy was recommended.  ICD indicated, evaluated by EP who feel that skin necrosis must resolve prior to ICD upgrade. ( per cardiology AV block - PPM.  Possible CRT-D. holding off on this with skin necrosis and infection.  He will have to go home with a LifeVest)  - Plan for LifeVest at discharge and readdress pacemaker upgrade in the future  -Cardiology following.  IV amiodarone has been discontinued started on p.o. Cardizem 400 mg p.o. twice daily   Cognitive impairment, likely post-cardiac arrest anoxic injury Patient noted to have processing deficits, memory impairment. -SLP for MMSE  Ischemic cardiomyopathy -Primary cardiologist Dr. Tamala Julian -With ejection fraction of 35-40%, Status post cardiac  cath 05/31/2021 -Continue current med including carvedilol 3.25 mg p.o. twice daily, amiodarone -per cardiology: Avoiding ACE inhibitor/ARB/ARNI.  Started beta blocker  Acute renal failure Metabolic acidosis Cr 2.6-9.4 at baseline.  Here wass 2.6 on admission, trending up now to 6.78  >>6.32 >> 5.70  today,  UOP good -Strict I/Os -Avoid nephrotoxins -Consult Nephrology, appreciate cares -Trend Cr.     Skin necrosis, left arm and left knee Due to extravasation of Levophed -Consult Plastic surgery, they recommend Xeroform daily and clinical monitoring, no plan for excision or surgical debridement, likely outpatient follow up -Proving    Orthostasis Patient with severe dizziness with standing or even sitting up right. -CHeck orthostatic vital signs -Stable   Paroxysmal atrial fibrillation History 2nd AV block with PPM Rate controlled -Will need a pacemaker--- to be reevaluated secondary to skin necrosis - Continue amiodarone, apixaban, carvedilol, amiodarone -Plan for amiodarone 400 BID for 2 weeks until Jun 29 then 200 daily  COPD  No active disease -Hold home ICS/LABA/LAMA, resume at discharge  Diabetes Blood sugars well controlled - Continue sliding scale corrections  Hypertension Chronic systolic CHF/ischemic cardiomyopathy/ CAD  Appears euvolemic -Continue aspirin, atorvastatin, carvedilol -Hold Entresto, Farxiga until renal function clearer  Possible aspiration pneumonia Completed 5 days Unasyn, leukocytosis and fever resolved. - Stop antibiotics  Anemia of critical illness Mild, no clinical bleeding    Resolved issues: Post cardiac arrest cardiogenic shock Postarrest metabolic acidosis due to lactic acidosis Resolved  Acute respiratory failure with hypoxia due to cardiac arrest Resolved  Hyponatremia  ruled out, mild transient low sodium, clinically insignificant    Disposition: Status is: Inpatient  Remains inpatient appropriate because:  renal function worsening, unclear disposition for this cognitively impaired patient.  Dispo: The patient is from: Home              Anticipated d/c is to: SNF vs Home with [facility] in 2-3 days               Patient currently is not medically stable to d/c.   Difficult to place patient No    Patient admitted for VF arrest, has had impressive recovery has combined renal failure and cognitive impairment.    He is stabilized from a cardiac standpoint, needs EP follow up for ICD and Plastic Surgery follow up for wounds from Levophed and Cards follow up for CHF.  Once his renal function starts to improve and nephrology feel that he will stable and not need dialysis, we will pursue rehabilitation placement.   Level of care: Telemetry Cardiac       MDM: The below labs and imaging reports were reviewed and summarized above.  Medication management as above.  This is a severe illnes with threat to life.     DVT prophylaxis: SCD's Start: 05/31/21 2150 apixaban (ELIQUIS) tablet 5 mg  Code Status: FULL        Objective: Vitals:   06/06/21 0835 06/06/21 1627 06/06/21 2001 06/07/21 0458  BP: (!) 169/67 (!) 162/67 (!) 157/66 (!) 167/66  Pulse: 60 (!) 59 (!) 59 (!) 59  Resp:  18 18 18   Temp:  98.3 F (36.8 C) 99.5 F (37.5 C) 98.8 F (37.1 C)  TempSrc:   Oral Oral  SpO2:  95% 95% 96%  Weight:    116.2 kg  Height:        Intake/Output Summary (Last 24 hours) at 06/07/2021 1049 Last data filed at 06/07/2021 0400 Gross per 24 hour  Intake 480 ml  Output 1600 ml  Net -1120 ml   Filed Weights   06/05/21 0445 06/06/21 0527 06/07/21 0458  Weight: 120.8 kg 117.4 kg 116.2 kg       Physical Exam:   General:  Alert, oriented, cooperative, no distress;   HEENT:  Normocephalic, PERRL, otherwise with in Normal limits   Neuro:  CNII-XII intact. , normal motor and sensation, reflexes intact   Lungs:   Clear to auscultation BL, Respirations unlabored, no wheezes / crackles  Cardio:     S1/S2, RRR, No murmure, No Rubs or Gallops   Abdomen:   Soft, non-tender, bowel sounds active all four quadrants,  no guarding or peritoneal signs.  Muscular skeletal:  Generalized weaknesses, Limited exam - in bed, able to move all 4 extremities, ,  2+ pulses,  symmetric, No pitting edema  Skin:  Dry, warm to touch, negative for any Rashes,  Wounds: Please see nursing documentation           Data Reviewed: I have personally reviewed following labs and imaging studies:  CBC: Recent Labs  Lab 06/03/21 0346 06/04/21 0503 06/05/21 0500 06/06/21 0156 06/07/21 0209  WBC 13.4* 9.9 10.8* 10.3 10.1  HGB 11.8* 11.0* 11.6* 11.7* 11.9*  HCT 36.4* 33.2* 33.9* 34.6* 35.9*  MCV 95.3 93.8 91.6 91.3 92.8  PLT 114* 111* 137* 160 937   Basic Metabolic Panel: Recent Labs  Lab 06/01/21 0303 06/02/21 0450 06/03/21 0346 06/04/21 0503 06/05/21 0500 06/06/21 0156 06/07/21 0209  NA 136   < > 134* 134* 135 135  135  K 3.6   < > 4.5 4.3 4.2 4.1 4.0  CL 110   < > 104 105 106 105 105  CO2 18*   < > 20* 19* 19* 19* 20*  GLUCOSE 173*   < > 131* 161* 113* 109* 79  BUN 34*   < > 58* 64* 73* 77* 77*  CREATININE 2.67*   < > 5.76* 6.22* 6.78* 6.32* 5.70*  CALCIUM 9.9   < > 8.6* 8.5* 9.1 9.6 9.9  MG 3.6*  --   --   --   --   --   --   PHOS 2.8  --   --   --   --   --   --    < > = values in this interval not displayed.   GFR: Estimated Creatinine Clearance: 17.1 mL/min (A) (by C-G formula based on SCr of 5.7 mg/dL (H)).  CBG: Recent Labs  Lab 06/06/21 1131 06/06/21 1626 06/06/21 2059 06/07/21 0618 06/07/21 0840  GLUCAP 86 106* 110* 85 110*   Urine analysis:    Component Value Date/Time   COLORURINE YELLOW 06/02/2021 1431   APPEARANCEUR CLOUDY (A) 06/02/2021 1431   LABSPEC 1.020 06/02/2021 1431   PHURINE 5.0 06/02/2021 1431   GLUCOSEU 150 (A) 06/02/2021 1431   HGBUR LARGE (A) 06/02/2021 1431   BILIRUBINUR NEGATIVE 06/02/2021 1431   KETONESUR NEGATIVE 06/02/2021 1431   PROTEINUR  100 (A) 06/02/2021 1431   NITRITE NEGATIVE 06/02/2021 1431   LEUKOCYTESUR LARGE (A) 06/02/2021 1431   Sepsis Labs: @LABRCNTIP (procalcitonin:4,lacticacidven:4)  ) Recent Results (from the past 240 hour(s))  C Difficile Quick Screen w PCR reflex     Status: None   Collection Time: 06/01/21 10:22 AM   Specimen: STOOL  Result Value Ref Range Status   C Diff antigen NEGATIVE NEGATIVE Final   C Diff toxin NEGATIVE NEGATIVE Final   C Diff interpretation No C. difficile detected.  Final    Comment: Performed at [facility], [place] [address], [place], [place] [address]  MRSA Next Gen by PCR, Nasal     Status: None   Collection Time: 06/02/21  8:30 AM  Result Value Ref Range Status   MRSA by PCR Next Gen NOT DETECTED NOT DETECTED Final    Comment: (NOTE) The GeneXpert MRSA Assay (FDA approved for NASAL specimens only), is one component of a comprehensive MRSA colonization surveillance program. It is not intended to diagnose MRSA infection nor to guide or monitor treatment for MRSA infections. Test performance is not FDA approved in patients less than 74 years old. Performed at [facility], [place] [address] [place], [place] [address]   Culture, Urine     Status: None   Collection Time: 06/02/21  2:31 PM   Specimen: Urine, Catheterized  Result Value Ref Range Status   Specimen Description URINE, CATHETERIZED  Final   Special Requests NONE  Final   Culture   Final    NO GROWTH Performed at [facility], [address], [place],  [address]    Report Status 06/03/2021 FINAL  Final  Culture, blood (routine x 2)     Status: None (Preliminary result)   Collection Time: 06/02/21  4:59 PM   Specimen: BLOOD RIGHT HAND  Result Value Ref Range Status   Specimen Description BLOOD RIGHT HAND  Final   Special Requests   Final    BOTTLES DRAWN AEROBIC ONLY Blood Culture results may not be optimal due  to an inadequate volume of blood received in culture bottles   Culture    Final    NO GROWTH 4 DAYS Performed at [facility], [place] [address] [place], [place] [address]    Report Status PENDING  Incomplete  Culture, blood (routine x 2)     Status: None (Preliminary result)   Collection Time: 06/02/21  5:08 PM   Specimen: BLOOD RIGHT HAND  Result Value Ref Range Status   Specimen Description BLOOD RIGHT HAND  Final   Special Requests   Final    BOTTLES DRAWN AEROBIC ONLY Blood Culture results may not be optimal due to an inadequate volume of blood received in culture bottles   Culture   Final    NO GROWTH 4 DAYS Performed at [facility], [place] [address], [place], [place] [address]    Report Status PENDING  Incomplete  SARS CORONAVIRUS 2 (TAT 6-24 HRS) Nasopharyngeal Nasopharyngeal Swab     Status: None   Collection Time: 06/04/21  4:02 PM   Specimen: Nasopharyngeal Swab  Result Value Ref Range Status   SARS Coronavirus 2 NEGATIVE NEGATIVE Final    Comment: (NOTE) SARS-CoV-2 target nucleic acids are NOT DETECTED.  The SARS-CoV-2 RNA is generally detectable in upper and lower respiratory specimens during the acute phase of infection. Negative results do not preclude SARS-CoV-2 infection, do not rule out co-infections with other pathogens, and should not be used as the sole basis for treatment or other patient management decisions. Negative results must be combined with clinical observations, patient history, and epidemiological information. The expected result is Negative.  Fact Sheet for Patients: SugarRoll.be  Fact Sheet for Healthcare Providers: https://www.woods-mathews.com/  This test is not yet approved or cleared by the [place] FDA and  has been authorized for detection and/or diagnosis of SARS-CoV-2 by FDA under an Emergency Use Authorization (EUA). This EUA will remain  in effect (meaning this test can be used) for the duration of the COVID-19 declaration under Se ction 564(b)(1)  of the Act, 21 U.S.C. section 360bbb-3(b)(1), unless the authorization is terminated or revoked sooner.  Performed at [facility], [place] [address] [place], [place] [address]          Radiology Studies: No results found.      Scheduled Meds:  amiodarone  400 mg Oral BID   apixaban  5 mg Oral BID   aspirin  81 mg Oral Daily   atorvastatin  80 mg Oral Daily   carvedilol  3.125 mg Oral BID WC   insulin aspart  0-9 Units Subcutaneous TID WC   isosorbide-hydrALAZINE  1 tablet Oral TID   mouth rinse  15 mL Mouth Rinse BID   polyethylene glycol  17 g Oral Daily   senna-docusate  1 tablet Oral BID   sodium chloride flush  10-40 mL Intracatheter Q12H   sodium chloride flush  3 mL Intravenous Q12H   Continuous Infusions:  sodium chloride     sodium chloride       LOS: 7 days    Time spent: 35 minutes    Deatra James, MD [facility] 06/07/2021, 10:49 AM     Please page though AMION or Epic secure chat:  For [employer], Adult nurse

## 2021-06-07 NOTE — Progress Notes (Addendum)
Urine output noted to dark pink blood dripping from penis, clotted.  Scrotum/penis tender and sore. Seen by MD with orders

## 2021-06-07 NOTE — Progress Notes (Signed)
  [facility] Progress Note   Assessment/ Plan:   AKI: most certainly ATN with prolonged downtime and contrast admin as well. No K/ CO2 or volume issues.  Cr from 1.12--> 4.5-->5.76 06/03/21--> 6.22.   - UOP picking up and Cr peaked at 6.8, finally decreasing 6.3--> 5.7.  UOP great, autodiuresing - volume status OK - no need for dialysis.  Expect continued improvement. - Hold all nephrotoxins including entresto, could consider resumption when cr at baseline - nothing further to add- will sign off.  Call with questions.    Out of hospital cardiac arrest: prolonged downtime.  Cardiology and EP following.  Would ideally do ICD but L arm pressor injury precludes safe insertion now per EP  Systolic CHF: EF 93%  L shin and L forearm blistering- from peripheral admin of pressors.  Local wound care, plastics has seen.  Dispo: pending    Subjective:    Seen in room- on commode.  Cr downtrending well.  Good UOP.      Objective:   BP (!) 167/66 (BP Location: Right Leg)   Pulse (!) 59   Temp 98.8 F (37.1 C) (Oral)   Resp 18   Ht 6' 1.5" (1.867 m)   Wt 116.2 kg   SpO2 96%   BMI 33.34 kg/m   Physical Exam: GEN nad, on commode HEENT EOMI PERRL NECK no overt JVD PULM clear CV RRR ABD soft EXT trace LE edema NEURO AAO x , cognitive slowing but no gross motor deficits SKIN: large area 5x 10 cm l shin with erythema and blistering, dressed with iodoform gauze.  L forearm with 4 x 8 ish area of blistering and erythema--> these are dressed today  Labs: BMET Recent Labs  Lab 06/01/21 0303 06/02/21 0450 06/02/21 1431 06/03/21 0346 06/04/21 0503 06/05/21 0500 06/06/21 0156 06/07/21 0209  NA 136 141 136 134* 134* 135 135 135  K 3.6 3.8 4.5 4.5 4.3 4.2 4.1 4.0  CL 110 110 104 104 105 106 105 105  CO2 18* 18* 20* 20* 19* 19* 19* 20*  GLUCOSE 173* 95 137* 131* 161* 113* 109* 79  BUN 34* 45* 53* 58* 64* 73* 77* 77*  CREATININE 2.67* 4.51* 5.06* 5.76* 6.22* 6.78*  6.32* 5.70*  CALCIUM 9.9 7.7* 8.8* 8.6* 8.5* 9.1 9.6 9.9  PHOS 2.8  --   --   --   --   --   --   --    CBC Recent Labs  Lab 06/04/21 0503 06/05/21 0500 06/06/21 0156 06/07/21 0209  WBC 9.9 10.8* 10.3 10.1  HGB 11.0* 11.6* 11.7* 11.9*  HCT 33.2* 33.9* 34.6* 35.9*  MCV 93.8 91.6 91.3 92.8  PLT 111* 137* 160 203      Medications:     amiodarone  400 mg Oral BID   apixaban  5 mg Oral BID   aspirin  81 mg Oral Daily   atorvastatin  80 mg Oral Daily   carvedilol  3.125 mg Oral BID WC   insulin aspart  0-9 Units Subcutaneous TID WC   isosorbide-hydrALAZINE  1 tablet Oral TID   mouth rinse  15 mL Mouth Rinse BID   polyethylene glycol  17 g Oral Daily   senna-docusate  1 tablet Oral BID   sodium chloride flush  10-40 mL Intracatheter Q12H   sodium chloride flush  3 mL Intravenous Q12H    Madelon Lips, MD 06/07/2021, 11:56 AM

## 2021-06-07 NOTE — Progress Notes (Signed)
Urine specimen sent to lab, clear, yellow urine.

## 2021-06-07 NOTE — Progress Notes (Signed)
Progress Note  Patient Name: Ross [name] Date of Encounter: 06/07/2021  Primary Cardiologist:   Sinclair Grooms, MD   Subjective   He is constipated and feels like he needs to have  BM.  He has some blood in his urine.    Inpatient Medications    Scheduled Meds:  amiodarone  400 mg Oral BID   apixaban  5 mg Oral BID   aspirin  81 mg Oral Daily   atorvastatin  80 mg Oral Daily   carvedilol  3.125 mg Oral BID WC   insulin aspart  0-9 Units Subcutaneous TID WC   isosorbide-hydrALAZINE  1 tablet Oral TID   mouth rinse  15 mL Mouth Rinse BID   polyethylene glycol  17 g Oral Daily   senna-docusate  1 tablet Oral BID   sodium chloride flush  10-40 mL Intracatheter Q12H   sodium chloride flush  3 mL Intravenous Q12H   Continuous Infusions:  sodium chloride     sodium chloride     PRN Meds: sodium chloride, acetaminophen, ALPRAZolam, guaiFENesin, ipratropium-albuterol, ondansetron (ZOFRAN) IV, oxyCODONE-acetaminophen, sodium chloride flush, sodium chloride flush   Vital Signs    Vitals:   06/06/21 0835 06/06/21 1627 06/06/21 2001 06/07/21 0458  BP: (!) 169/67 (!) 162/67 (!) 157/66 (!) 167/66  Pulse: 60 (!) 59 (!) 59 (!) 59  Resp:  18 18 18   Temp:  98.3 F (36.8 C) 99.5 F (37.5 C) 98.8 F (37.1 C)  TempSrc:   Oral Oral  SpO2:  95% 95% 96%  Weight:    116.2 kg  Height:        Intake/Output Summary (Last 24 hours) at 06/07/2021 0842 Last data filed at 06/07/2021 0400 Gross per 24 hour  Intake 480 ml  Output 1800 ml  Net -1320 ml   Filed Weights   06/05/21 0445 06/06/21 0527 06/07/21 0458  Weight: 120.8 kg 117.4 kg 116.2 kg    Telemetry    Likely underlying atrial fib with ventricular pacing - Personally Reviewed  ECG    NA - Personally Reviewed  Physical Exam   GEN: No acute distress.   Neck: No  JVD Cardiac: RRR, no murmurs, rubs, or gallops.  Respiratory: Clear  to auscultation bilaterally. GI: Soft, nontender, non-distended  MS:    Mild  left leg edema; No deformity. Neuro:  Nonfocal  Psych: Normal affect   Labs    Chemistry Recent Labs  Lab 06/05/21 0500 06/06/21 0156 06/07/21 0209  NA 135 135 135  K 4.2 4.1 4.0  CL 106 105 105  CO2 19* 19* 20*  GLUCOSE 113* 109* 79  BUN 73* 77* 77*  CREATININE 6.78* 6.32* 5.70*  CALCIUM 9.1 9.6 9.9  GFRNONAA 8* 9* 10*  ANIONGAP 10 11 10      Hematology Recent Labs  Lab 06/05/21 0500 06/06/21 0156 06/07/21 0209  WBC 10.8* 10.3 10.1  RBC 3.70* 3.79* 3.87*  HGB 11.6* 11.7* 11.9*  HCT 33.9* 34.6* 35.9*  MCV 91.6 91.3 92.8  MCH 31.4 30.9 30.7  MCHC 34.2 33.8 33.1  RDW 13.3 13.3 13.3  PLT 137* 160 203    Cardiac EnzymesNo results for input(s): TROPONINI in the last 168 hours. No results for input(s): TROPIPOC in the last 168 hours.   BNPNo results for input(s): BNP, PROBNP in the last 168 hours.   DDimer No results for input(s): DDIMER in the last 168 hours.   Radiology    No results found.  Cardiac  Studies   2D echocardiogram (06/01/2021)   1. Left ventricular ejection fraction, by estimation, is 35 to 40%. The  left ventricle has moderately decreased function. The left ventricle has  no regional wall motion abnormalities. There is severe concentric left  ventricular hypertrophy. Left  ventricular diastolic parameters are indeterminate. Elevated left  ventricular end-diastolic pressure. There is akinesis of the left  ventricular, mid anteroseptal wall. There is akinesis of the left  ventricular, apical septal wall, inferior wall and  anterior wall. There is akinesis of the left ventricular, apical segment.   2. Right ventricular systolic function was not well visualized. The right  ventricular size is mildly enlarged. Tricuspid regurgitation signal is  inadequate for assessing PA pressure.   3. Right atrial size was severely dilated.   4. The mitral valve is normal in structure. Trivial mitral valve  regurgitation. No evidence of mitral stenosis.   5.  The aortic valve is tricuspid. Aortic valve regurgitation is not  visualized. Mild aortic valve sclerosis is present, with no evidence of  aortic valve stenosis.   6. Aortic dilatation noted. There is mild dilatation of the aortic root,  measuring 41 mm.   7. The inferior vena cava is dilated in size with >50% respiratory  variability, suggesting right atrial pressure of 8 mmHg.   Cardiac catheterization (06/01/2019)     Prox RCA to Mid RCA lesion is 99% stenosed. Dist RCA lesion is 80% stenosed. Ramus lesion is 100% stenosed. Origin to Prox Graft lesion is 100% stenosed. Origin lesion is 100% stenosed. Prox LAD lesion is 70% stenosed. Prox LAD to Mid LAD lesion is 70% stenosed. Mid LAD-1 lesion is 90% stenosed. Mid LAD-2 lesion is 80% stenosed.   VF cardiac arrest with prolonged resuscitative effort and ultimate restoration of paced rhythm following multiple shocks, amiodarone, and Levophed infusion.   Severe native CAD with diffuse severe proximal LAD stenoses of 70 to 80% with focal 90% stenosis between the first and second septal perforating artery with total occlusion of the first diagonal vessel and a flush and fill phenomena of the mid LAD secondary to competitive filling via the LIMA graft.   Old occlusion of the ramus immediate vessel and obtuse marginal vessel   Dominant RCA with previously noted  diffuse 90% mid stenosis which now has right to right collateralization and diffuse 80% distal stenosis.   Patent LIMA graft supplying the mid LAD.   Patent SVG supplying the first diagonal vessel.   Old occlusion of the vein graft to a ramus intermediate vessel.   Old occlusion of the vein graft which had supplied the distal RCA.   LVEDP 20 mmHg   RECOMMENDATION: Patient is admitted to the critical care service.  We will plan 2D echo Doppler study in a.m.  He has been seen by Dr. Rayann Heman in the emergency room during his prolonged resuscitative efforts today.  Guideline  directed medical therapy for CHF.  Anticipate ICD implantation per electrophysiology.   Coronary Diagrams     Diagnostic Dominance: Right        Patient Profile     65 y.o. male with known ischemic CM (EF 25%), prior CABG, permanent afib, and DM who is admitted with VF arrest.  Assessment & Plan     Status post VF arrest:     Continue with supportive care. -patient had witnessed cardiac arrest, defibrillation x10 and CPR with ROSC.  He was intubated and transported to [facility].  He currently has a paced  rhythm.  IV amiodarone has been converted to p.o. load.  He has been extubated and is hemodynamically stable.  Amazingly, he is neurologically intact so with only mild mild intermittent confusion.  Ischemic cardiomyopathy: EF 30%. Avoiding ACE inhibitor/ARB/ARNI.  Started beta blocker.   Blood pressure is elevated.  I will start BiDil.  Atrial fibrillation :  Atrial fib, ventricular pacing.  On Eliquis.    Secondary AV block:  PPM.   When showing me up.  Possible CRT-D.  However, holding off on this with skin necrosis and infection.  He will have to go home with a LifeVest.  Acute renal insufficiency: Creatinine is creeping back down to 5.7.  Nephrology following.  Left upper extremity skin necrosis:   Skin necrosis followed by vascular surgery.  Constipation:  I will defer to Orpah Melter, MD. He wants to restart linaclotide and I will defer to his PCP.     For questions or updates, please contact [place] Please consult [email or web address] for contact info under Cardiology/STEMI.   Signed, Minus Breeding, MD  06/07/2021, 8:42 AM

## 2021-06-07 NOTE — Progress Notes (Signed)
Pt's blood sugar this AM 85, this RN made aware my [place], CNA.  Pt given orange juice at this time.  Pt awake, alert and oriented.  No needs or concerns at this time.

## 2021-06-08 DIAGNOSIS — Z951 Presence of aortocoronary bypass graft: Secondary | ICD-10-CM | POA: Diagnosis not present

## 2021-06-08 DIAGNOSIS — I4819 Other persistent atrial fibrillation: Secondary | ICD-10-CM | POA: Diagnosis not present

## 2021-06-08 DIAGNOSIS — I469 Cardiac arrest, cause unspecified: Secondary | ICD-10-CM | POA: Diagnosis not present

## 2021-06-08 DIAGNOSIS — I255 Ischemic cardiomyopathy: Secondary | ICD-10-CM | POA: Diagnosis not present

## 2021-06-08 LAB — COMPREHENSIVE METABOLIC PANEL
ALT: 68 U/L — ABNORMAL HIGH (ref 0–44)
AST: 37 U/L (ref 15–41)
Albumin: 2.8 g/dL — ABNORMAL LOW (ref 3.5–5.0)
Alkaline Phosphatase: 73 U/L (ref 38–126)
Anion gap: 10 (ref 5–15)
BUN: 73 mg/dL — ABNORMAL HIGH (ref 8–23)
CO2: 19 mmol/L — ABNORMAL LOW (ref 22–32)
Calcium: 9.9 mg/dL (ref 8.9–10.3)
Chloride: 107 mmol/L (ref 98–111)
Creatinine, Ser: 4.77 mg/dL — ABNORMAL HIGH (ref 0.61–1.24)
GFR, Estimated: 13 mL/min — ABNORMAL LOW (ref >60)
Glucose, Bld: 94 mg/dL (ref 70–99)
Potassium: 3.9 mmol/L (ref 3.5–5.1)
Sodium: 136 mmol/L (ref 135–145)
Total Bilirubin: 1.1 mg/dL (ref 0.3–1.2)
Total Protein: 5.4 g/dL — ABNORMAL LOW (ref 6.5–8.1)

## 2021-06-08 LAB — GLUCOSE, CAPILLARY
Glucose-Capillary: 104 mg/dL — ABNORMAL HIGH (ref 70–99)
Glucose-Capillary: 118 mg/dL — ABNORMAL HIGH (ref 70–99)
Glucose-Capillary: 142 mg/dL — ABNORMAL HIGH (ref 70–99)
Glucose-Capillary: 134 mg/dL — ABNORMAL HIGH (ref 70–99)

## 2021-06-08 LAB — APTT: aPTT: 33 seconds (ref 24–36)

## 2021-06-08 MED ORDER — SIMETHICONE 80 MG PO CHEW
80.0000 mg | CHEWABLE_TABLET | Freq: Four times a day (QID) | ORAL | Status: DC | PRN
  Administered 2021-06-08 – 2021-06-09 (×2): 80 mg via ORAL
  Filled 2021-06-08 (×2): qty 1

## 2021-06-08 MED ORDER — CARVEDILOL 6.25 MG PO TABS
6.2500 mg | ORAL_TABLET | Freq: Two times a day (BID) | ORAL | Status: DC
  Administered 2021-06-08 – 2021-06-12 (×8): 6.25 mg via ORAL
  Filled 2021-06-08 (×8): qty 1

## 2021-06-08 NOTE — Progress Notes (Signed)
Progress Note  Patient Name: Ross [name] Date of Encounter: 06/08/2021  Primary Cardiologist:   Sinclair Grooms, MD   Subjective   He has soreness in his arm and chest from compressions.  Inpatient Medications    Scheduled Meds:  amiodarone  400 mg Oral BID   apixaban  5 mg Oral BID   aspirin  81 mg Oral Daily   atorvastatin  80 mg Oral Daily   carvedilol  3.125 mg Oral BID WC   insulin aspart  0-9 Units Subcutaneous TID WC   isosorbide-hydrALAZINE  1 tablet Oral TID   mouth rinse  15 mL Mouth Rinse BID   polyethylene glycol  17 g Oral Daily   senna-docusate  1 tablet Oral BID   sodium chloride flush  10-40 mL Intracatheter Q12H   sodium chloride flush  3 mL Intravenous Q12H   Continuous Infusions:  sodium chloride     sodium chloride     PRN Meds: sodium chloride, acetaminophen, ALPRAZolam, guaiFENesin, ipratropium-albuterol, ondansetron (ZOFRAN) IV, oxyCODONE-acetaminophen, sodium chloride flush, sodium chloride flush   Vital Signs    Vitals:   06/07/21 1346 06/07/21 1932 06/07/21 2101 06/08/21 0434  BP: 134/64 (!) 150/57 (!) 152/65 (!) 148/72  Pulse: 60 61 62 (!) 59  Resp: 20 16  18   Temp: 98.7 F (37.1 C) 98.4 F (36.9 C)  98.5 F (36.9 C)  TempSrc: Oral Oral  Oral  SpO2: 96% 94%  96%  Weight:    113.7 kg  Height:        Intake/Output Summary (Last 24 hours) at 06/08/2021 1102 Last data filed at 06/08/2021 0900 Gross per 24 hour  Intake 960 ml  Output 2752 ml  Net -1792 ml   Filed Weights   06/06/21 0527 06/07/21 0458 06/08/21 0434  Weight: 117.4 kg 116.2 kg 113.7 kg    Telemetry    Atrial fib with ventricular pacing  - Personally Reviewed  ECG    NA - Personally Reviewed  Physical Exam   GEN: No  acute distress.   Neck: No  JVD Cardiac: RRR, no murmurs, rubs, or gallops.  Respiratory: Clear   to auscultation bilaterally. GI: Soft, nontender, non-distended, normal bowel sounds  MS:     Left leg edema; No deformity. Neuro:    Nonfocal  Psych: Oriented and appropriate    Labs    Chemistry Recent Labs  Lab 06/06/21 0156 06/07/21 0209 06/08/21 0816  NA 135 135 136  K 4.1 4.0 3.9  CL 105 105 107  CO2 19* 20* 19*  GLUCOSE 109* 79 94  BUN 77* 77* 73*  CREATININE 6.32* 5.70* 4.77*  CALCIUM 9.6 9.9 9.9  PROT  --   --  5.4*  ALBUMIN  --   --  2.8*  AST  --   --  37  ALT  --   --  68*  ALKPHOS  --   --  73  BILITOT  --   --  1.1  GFRNONAA 9* 10* 13*  ANIONGAP 11 10 10      Hematology Recent Labs  Lab 06/05/21 0500 06/06/21 0156 06/07/21 0209  WBC 10.8* 10.3 10.1  RBC 3.70* 3.79* 3.87*  HGB 11.6* 11.7* 11.9*  HCT 33.9* 34.6* 35.9*  MCV 91.6 91.3 92.8  MCH 31.4 30.9 30.7  MCHC 34.2 33.8 33.1  RDW 13.3 13.3 13.3  PLT 137* 160 203    Cardiac EnzymesNo results for input(s): TROPONINI in the last 168 hours. No  results for input(s): TROPIPOC in the last 168 hours.   BNPNo results for input(s): BNP, PROBNP in the last 168 hours.   DDimer No results for input(s): DDIMER in the last 168 hours.   Radiology    No results found.  Cardiac Studies   2D echocardiogram (06/01/2021)   1. Left ventricular ejection fraction, by estimation, is 35 to 40%. The  left ventricle has moderately decreased function. The left ventricle has  no regional wall motion abnormalities. There is severe concentric left  ventricular hypertrophy. Left  ventricular diastolic parameters are indeterminate. Elevated left  ventricular end-diastolic pressure. There is akinesis of the left  ventricular, mid anteroseptal wall. There is akinesis of the left  ventricular, apical septal wall, inferior wall and  anterior wall. There is akinesis of the left ventricular, apical segment.   2. Right ventricular systolic function was not well visualized. The right  ventricular size is mildly enlarged. Tricuspid regurgitation signal is  inadequate for assessing PA pressure.   3. Right atrial size was severely dilated.   4. The mitral  valve is normal in structure. Trivial mitral valve  regurgitation. No evidence of mitral stenosis.   5. The aortic valve is tricuspid. Aortic valve regurgitation is not  visualized. Mild aortic valve sclerosis is present, with no evidence of  aortic valve stenosis.   6. Aortic dilatation noted. There is mild dilatation of the aortic root,  measuring 41 mm.   7. The inferior vena cava is dilated in size with >50% respiratory  variability, suggesting right atrial pressure of 8 mmHg.   Cardiac catheterization (06/01/2019)     Prox RCA to Mid RCA lesion is 99% stenosed. Dist RCA lesion is 80% stenosed. Ramus lesion is 100% stenosed. Origin to Prox Graft lesion is 100% stenosed. Origin lesion is 100% stenosed. Prox LAD lesion is 70% stenosed. Prox LAD to Mid LAD lesion is 70% stenosed. Mid LAD-1 lesion is 90% stenosed. Mid LAD-2 lesion is 80% stenosed.   VF cardiac arrest with prolonged resuscitative effort and ultimate restoration of paced rhythm following multiple shocks, amiodarone, and Levophed infusion.   Severe native CAD with diffuse severe proximal LAD stenoses of 70 to 80% with focal 90% stenosis between the first and second septal perforating artery with total occlusion of the first diagonal vessel and a flush and fill phenomena of the mid LAD secondary to competitive filling via the LIMA graft.   Old occlusion of the ramus immediate vessel and obtuse marginal vessel   Dominant RCA with previously noted  diffuse 90% mid stenosis which now has right to right collateralization and diffuse 80% distal stenosis.   Patent LIMA graft supplying the mid LAD.   Patent SVG supplying the first diagonal vessel.   Old occlusion of the vein graft to a ramus intermediate vessel.   Old occlusion of the vein graft which had supplied the distal RCA.   LVEDP 20 mmHg   RECOMMENDATION: Patient is admitted to the critical care service.  We will plan 2D echo Doppler study in a.m.  He has been  seen by Dr. Rayann Heman in the emergency room during his prolonged resuscitative efforts today.  Guideline directed medical therapy for CHF.  Anticipate ICD implantation per electrophysiology.   Coronary Diagrams     Diagnostic Dominance: Right        Patient Profile     65 y.o. male with known ischemic CM (EF 25%), prior CABG, permanent afib, and DM who is admitted with VF arrest.  Assessment & Plan     Status post VF arrest:     Continue with supportive care. -patient had witnessed cardiac arrest, defibrillation x10 and CPR with ROSC.  He will go to rehab and will need a Life Vest at discharge with ultimately plans for a device upgrade.   I went through the concept of Life Vest and showed him pictures.  I also talked to him about rehab.  He has lots of great questions.    Ischemic cardiomyopathy: EF 30%. Avoiding ACE inhibitor/ARB/ARNI.  Started beta blocker and will increased today.   Started BiDil yesterday.   Atrial fibrillation :  Atrial fib, ventricular pacing.  Continue eliquis.   Secondary AV block:  PPM.   When showing me up.  Possible CRT-D. As above.    Acute renal insufficiency: Creatinine is trending down and nephrology has signed off.    Left upper extremity skin necrosis:   Skin necrosis followed by plastic surgery.   For questions or updates, please contact [place] Please consult [email or web address] for contact info under Cardiology/STEMI.   Signed, Minus Breeding, MD  06/08/2021, 11:02 AM

## 2021-06-08 NOTE — Progress Notes (Signed)
Patient complains of bloating and gas,provider notified.

## 2021-06-08 NOTE — Progress Notes (Signed)
PT acknowledges new PT consult. Pt is already on PT caseload at this time. PT will follow up per current plan of care.

## 2021-06-08 NOTE — Progress Notes (Signed)
[facility] PROGRESS NOTE    LANSING [name]  MVH:[id number] DOB: February 28, 1956 DOA: 05/31/2021 PCP: Orpah Melter, MD      Brief Narrative:  Ross [name] is a 65 y.o. M with hx AF and 2nd deg AVB with PPM, sCHF EF 25%, stable angina, MO, COPD not on O2, CAD s/pCABG 2007, HTN, and DM who presented with witnessed out of hospital cardiac arrest.    Defibrillated x10, amiodarone 450 and over an hour CPR time.  After which he was able to follow commands, taken to the Cath lab and found to have diffuse disease.   6/11: Cardiac arrest, intubated, underwent left heart cath 6/12: Extubated      Subjective: The patient was seen and examined, stable denies any chest pain or shortness of breath Continue complaining of elevated blood pressure, generalized weakness     Assessment & Plan:  Status post V. fib cardiac arrest Prolonged VT/VF cardiac arrest, out of hospital Initial presenting problem. -Remained stable  Left heart cath on 05/31/2021 -showed occluded vein to the RCA and occluded vein to the diagonal branch and medical therapy was recommended.  ICD indicated, evaluated by EP who feel that skin necrosis must resolve prior to ICD upgrade. ( per cardiology AV block - PPM.  Possible CRT-D. holding off on this with skin necrosis and infection.  He will have to go home with a LifeVest)  - Plan for LifeVest at discharge and readdress pacemaker upgrade in the future  -Cardiology following.  IV amiodarone has been discontinued started on p.o. Cardizem 400 mg p.o. twice daily     Ischemic cardiomyopathy -Primary cardiologist Dr. Tamala Julian -With ejection fraction of 35-40%, Status post cardiac cath 05/31/2021 -Continue current med including carvedilol, amiodarone -per cardiology: Avoiding ACE inhibitor/ARB/ARNI.  increasing BB today -BiDil on 06/07/21  Acute renal failure /  Metabolic acidosis - Cr 8.4-1.3 at baseline.  Here wass 2.6 on admission, trending up now to  6.78  >>6.32 >> 5.70  >.4.77 today,  UOP good -Strict I/Os -Avoid nephrotoxins - Nephrology following, appreciate cares -Trend Cr.     Skin necrosis, left arm and left knee Due to extravasation of Levophed -Consult Plastic surgery, they recommend Xeroform daily and clinical monitoring, no plan for excision or surgical debridement, likely outpatient follow up -Improving    Orthostasis - Resolved    Paroxysmal atrial fibrillation History 2nd AV block with PPM Rate controlled -Will need a pacemaker--- to be reevaluated secondary to skin necrosis - Continue amiodarone, apixaban, carvedilol, amiodarone -Plan for amiodarone 400 BID for 2 weeks until Jun 29 then 200 daily  COPD  No active disease -Hold home ICS/LABA/LAMA, resume at discharge  Diabetes Blood sugars well controlled - Continue sliding scale corrections  Hypertension Chronic systolic CHF/ischemic cardiomyopathy/ CAD  Appears euvolemic -Continue aspirin, atorvastatin, carvedilol -Hold Entresto, Farxiga until renal function clearer  Possible aspiration pneumonia Completed 5 days Unasyn, leukocytosis and fever resolved. - Stop antibiotics  Anemia of critical illness Mild, no clinical bleeding  Cognitive impairment, likely post-cardiac arrest anoxic injury Patient noted to have processing deficits, memory impairment. -SLP for MMSE  Resolved issues: Post cardiac arrest cardiogenic shock Postarrest metabolic acidosis due to lactic acidosis Acute respiratory failure with hypoxia due to cardiac arrest Hyponatremia    Disposition: Status is: Inpatient  Remains inpatient appropriate because: renal function worsening, unclear disposition for this cognitively impaired patient.  Dispo: The patient is from: Home  Anticipated d/c is to: SNF in 1-2 days  He will go to rehab and will need a Life Vest at discharge with ultimately plans for a device upgrade.-               Patient currently is not  medically stable to d/c.  Cardiology titrating medication   Difficult to place patient No    Patient admitted for VF arrest, has had impressive recovery has combined renal failure and cognitive impairment.    He is stabilized from a cardiac standpoint, needs EP follow up for ICD and Plastic Surgery follow up for wounds from Levophed and Cards follow up for CHF.  Once his renal function starts to improve and nephrology feel that he will stable and not need dialysis, we will pursue rehabilitation placement.   Level of care: Telemetry Cardiac       MDM: The below labs and imaging reports were reviewed and summarized above.  Medication management as above.  This is a severe illnes with threat to life.     DVT prophylaxis: SCD's Start: 05/31/21 2150 apixaban (ELIQUIS) tablet 5 mg  Code Status: FULL        Objective: Vitals:   06/07/21 1932 06/07/21 2101 06/08/21 0434 06/08/21 1129  BP: (!) 150/57 (!) 152/65 (!) 148/72 (!) 182/73  Pulse: 61 62 (!) 59 60  Resp: 16  18 20   Temp: 98.4 F (36.9 C)  98.5 F (36.9 C) 97.8 F (36.6 C)  TempSrc: Oral  Oral Oral  SpO2: 94%  96% 96%  Weight:   113.7 kg   Height:        Intake/Output Summary (Last 24 hours) at 06/08/2021 1305 Last data filed at 06/08/2021 1157 Gross per 24 hour  Intake 960 ml  Output 2752 ml  Net -1792 ml   Filed Weights   06/06/21 0527 06/07/21 0458 06/08/21 0434  Weight: 117.4 kg 116.2 kg 113.7 kg      Physical Exam:   General:  Alert, oriented, cooperative, no distress;   HEENT:  Normocephalic, PERRL, otherwise with in Normal limits   Neuro:  CNII-XII intact. , normal motor and sensation, reflexes intact   Lungs:   Clear to auscultation BL, Respirations unlabored, no wheezes / crackles  Cardio:    S1/S2, RRR, No murmure, No Rubs or Gallops   Abdomen:   Soft, non-tender, bowel sounds active all four quadrants,  no guarding or peritoneal signs.  Muscular skeletal:  Limited exam - in bed, able to  move all 4 extremities, Normal strength,  2+ pulses,  symmetric, No pitting edema  Skin:  Dry, warm to touch, multiple skin site skin lesions, dressing in place  Wounds: Please see nursing documentation             Data Reviewed: I have personally reviewed following labs and imaging studies:  CBC: Recent Labs  Lab 06/03/21 0346 06/04/21 0503 06/05/21 0500 06/06/21 0156 06/07/21 0209  WBC 13.4* 9.9 10.8* 10.3 10.1  HGB 11.8* 11.0* 11.6* 11.7* 11.9*  HCT 36.4* 33.2* 33.9* 34.6* 35.9*  MCV 95.3 93.8 91.6 91.3 92.8  PLT 114* 111* 137* 160 563   Basic Metabolic Panel: Recent Labs  Lab 06/04/21 0503 06/05/21 0500 06/06/21 0156 06/07/21 0209 06/08/21 0816  NA 134* 135 135 135 136  K 4.3 4.2 4.1 4.0 3.9  CL 105 106 105 105 107  CO2 19* 19* 19* 20* 19*  GLUCOSE 161* 113* 109* 79 94  BUN 64* 73* 77* 77* 73*  CREATININE 6.22* 6.78* 6.32* 5.70* 4.77*  CALCIUM 8.5* 9.1 9.6 9.9 9.9   GFR: Estimated Creatinine Clearance: 20.3 mL/min (A) (by C-G formula based on SCr of 4.77 mg/dL (H)).  CBG: Recent Labs  Lab 06/07/21 1134 06/07/21 1609 06/07/21 2100 06/08/21 0612 06/08/21 1101  GLUCAP 122* 90 126* 104* 118*   Urine analysis:    Component Value Date/Time   COLORURINE YELLOW 06/07/2021 1501   APPEARANCEUR CLEAR 06/07/2021 1501   LABSPEC 1.012 06/07/2021 1501   PHURINE 5.0 06/07/2021 1501   GLUCOSEU 50 (A) 06/07/2021 1501   HGBUR MODERATE (A) 06/07/2021 1501   BILIRUBINUR NEGATIVE 06/07/2021 1501   KETONESUR NEGATIVE 06/07/2021 1501   PROTEINUR NEGATIVE 06/07/2021 1501   NITRITE NEGATIVE 06/07/2021 1501   LEUKOCYTESUR NEGATIVE 06/07/2021 1501   Sepsis Labs: @LABRCNTIP (procalcitonin:4,lacticacidven:4)  ) Recent Results (from the past 240 hour(s))  C Difficile Quick Screen w PCR reflex     Status: None   Collection Time: 06/01/21 10:22 AM   Specimen: STOOL  Result Value Ref Range Status   C Diff antigen NEGATIVE NEGATIVE Final   C Diff toxin NEGATIVE  NEGATIVE Final   C Diff interpretation No C. difficile detected.  Final    Comment: Performed at [facility], [place] [address] [place], [place] [address]  MRSA Next Gen by PCR, Nasal     Status: None   Collection Time: 06/02/21  8:30 AM  Result Value Ref Range Status   MRSA by PCR Next Gen NOT DETECTED NOT DETECTED Final    Comment: (NOTE) The GeneXpert MRSA Assay (FDA approved for NASAL specimens only), is one component of a comprehensive MRSA colonization surveillance program. It is not intended to diagnose MRSA infection nor to guide or monitor treatment for MRSA infections. Test performance is not FDA approved in patients less than 29 years old. Performed at [facility], [place] [address] [place], [place] [address]   Culture, Urine     Status: None   Collection Time: 06/02/21  2:31 PM   Specimen: Urine, Catheterized  Result Value Ref Range Status   Specimen Description URINE, CATHETERIZED  Final   Special Requests NONE  Final   Culture   Final    NO GROWTH Performed at [facility], [address], [place], [place] [address]    Report Status 06/03/2021 FINAL  Final  Culture, blood (routine x 2)     Status: None   Collection Time: 06/02/21  4:59 PM   Specimen: BLOOD RIGHT HAND  Result Value Ref Range Status   Specimen Description BLOOD RIGHT HAND  Final   Special Requests   Final    BOTTLES DRAWN AEROBIC ONLY Blood Culture results may not be optimal due to an inadequate volume of blood received in culture bottles   Culture   Final    NO GROWTH 5 DAYS Performed at [facility], [place] [address] [place], [place] [address]    Report Status 06/07/2021 FINAL  Final  Culture, blood (routine x 2)     Status: None   Collection Time: 06/02/21  5:08 PM   Specimen: BLOOD RIGHT HAND  Result Value Ref Range Status   Specimen Description BLOOD RIGHT HAND  Final   Special Requests   Final    BOTTLES DRAWN AEROBIC ONLY Blood Culture results may not be optimal due  to an inadequate volume of blood received in culture bottles   Culture   Final    NO GROWTH  5 DAYS Performed at [facility], [place] [address], [place], [place] [address]    Report Status 06/07/2021 FINAL  Final  SARS CORONAVIRUS 2 (TAT 6-24 HRS) Nasopharyngeal Nasopharyngeal Swab     Status: None   Collection Time: 06/04/21  4:02 PM   Specimen: Nasopharyngeal Swab  Result Value Ref Range Status   SARS Coronavirus 2 NEGATIVE NEGATIVE Final    Comment: (NOTE) SARS-CoV-2 target nucleic acids are NOT DETECTED.  The SARS-CoV-2 RNA is generally detectable in upper and lower respiratory specimens during the acute phase of infection. Negative results do not preclude SARS-CoV-2 infection, do not rule out co-infections with other pathogens, and should not be used as the sole basis for treatment or other patient management decisions. Negative results must be combined with clinical observations, patient history, and epidemiological information. The expected result is Negative.  Fact Sheet for Patients: SugarRoll.be  Fact Sheet for Healthcare Providers: https://www.woods-mathews.com/  This test is not yet approved or cleared by the [place] FDA and  has been authorized for detection and/or diagnosis of SARS-CoV-2 by FDA under an Emergency Use Authorization (EUA). This EUA will remain  in effect (meaning this test can be used) for the duration of the COVID-19 declaration under Se ction 564(b)(1) of the Act, 21 U.S.C. section 360bbb-3(b)(1), unless the authorization is terminated or revoked sooner.  Performed at [facility], [place] [address], [place], [place] [address]          Radiology Studies: No results found.      Scheduled Meds:  amiodarone  400 mg Oral BID   apixaban  5 mg Oral BID   aspirin  81 mg Oral Daily   atorvastatin  80 mg Oral Daily   carvedilol  6.25 mg Oral BID WC   insulin aspart  0-9 Units  Subcutaneous TID WC   isosorbide-hydrALAZINE  1 tablet Oral TID   mouth rinse  15 mL Mouth Rinse BID   polyethylene glycol  17 g Oral Daily   senna-docusate  1 tablet Oral BID   sodium chloride flush  10-40 mL Intracatheter Q12H   sodium chloride flush  3 mL Intravenous Q12H   Continuous Infusions:  sodium chloride     sodium chloride       LOS: 8 days    Time spent: 35 minutes    Deatra James, MD [facility] 06/08/2021, 1:05 PM     Please page though AMION or Epic secure chat:  For [employer], Adult nurse

## 2021-06-08 NOTE — Plan of Care (Signed)

## 2021-06-09 DIAGNOSIS — I469 Cardiac arrest, cause unspecified: Secondary | ICD-10-CM | POA: Diagnosis not present

## 2021-06-09 DIAGNOSIS — Z951 Presence of aortocoronary bypass graft: Secondary | ICD-10-CM | POA: Diagnosis not present

## 2021-06-09 DIAGNOSIS — I4819 Other persistent atrial fibrillation: Secondary | ICD-10-CM | POA: Diagnosis not present

## 2021-06-09 DIAGNOSIS — I255 Ischemic cardiomyopathy: Secondary | ICD-10-CM | POA: Diagnosis not present

## 2021-06-09 LAB — GLUCOSE, CAPILLARY
Glucose-Capillary: 104 mg/dL — ABNORMAL HIGH (ref 70–99)
Glucose-Capillary: 110 mg/dL — ABNORMAL HIGH (ref 70–99)
Glucose-Capillary: 137 mg/dL — ABNORMAL HIGH (ref 70–99)
Glucose-Capillary: 125 mg/dL — ABNORMAL HIGH (ref 70–99)

## 2021-06-09 LAB — BASIC METABOLIC PANEL
Anion gap: 9 (ref 5–15)
BUN: 65 mg/dL — ABNORMAL HIGH (ref 8–23)
CO2: 21 mmol/L — ABNORMAL LOW (ref 22–32)
Calcium: 9.9 mg/dL (ref 8.9–10.3)
Chloride: 108 mmol/L (ref 98–111)
Creatinine, Ser: 3.97 mg/dL — ABNORMAL HIGH (ref 0.61–1.24)
GFR, Estimated: 16 mL/min — ABNORMAL LOW (ref >60)
Glucose, Bld: 130 mg/dL — ABNORMAL HIGH (ref 70–99)
Potassium: 3.7 mmol/L (ref 3.5–5.1)
Sodium: 138 mmol/L (ref 135–145)

## 2021-06-09 LAB — APTT: aPTT: 32 seconds (ref 24–36)

## 2021-06-09 NOTE — Progress Notes (Addendum)
Progress Note  Patient Name: BHAVIN MONJARAZ Date of Encounter: 06/09/2021  [facility] Cardiologist: Belva Crome III, MD   Subjective   Sitting up on the side of the bed. Concerned regarding finances r/t hospital stay, SNF and lifevest.    Inpatient Medications    Scheduled Meds:  amiodarone  400 mg Oral BID   apixaban  5 mg Oral BID   aspirin  81 mg Oral Daily   atorvastatin  80 mg Oral Daily   carvedilol  6.25 mg Oral BID WC   insulin aspart  0-9 Units Subcutaneous TID WC   isosorbide-hydrALAZINE  1 tablet Oral TID   mouth rinse  15 mL Mouth Rinse BID   polyethylene glycol  17 g Oral Daily   senna-docusate  1 tablet Oral BID   sodium chloride flush  10-40 mL Intracatheter Q12H   sodium chloride flush  3 mL Intravenous Q12H   Continuous Infusions:  sodium chloride     sodium chloride     PRN Meds: sodium chloride, acetaminophen, ALPRAZolam, guaiFENesin, ipratropium-albuterol, ondansetron (ZOFRAN) IV, oxyCODONE-acetaminophen, simethicone, sodium chloride flush, sodium chloride flush   Vital Signs    Vitals:   06/08/21 2032 06/09/21 0004 06/09/21 0401 06/09/21 0834  BP: (!) 167/68  (!) 155/68 (!) 159/67  Pulse: 60  (!) 59 61  Resp: 20  20   Temp: 98.6 F (37 C)  98.9 F (37.2 C)   TempSrc: Oral  Oral   SpO2: 96%  97%   Weight:  113 kg    Height:        Intake/Output Summary (Last 24 hours) at 06/09/2021 1007 Last data filed at 06/09/2021 0413 Gross per 24 hour  Intake 720 ml  Output 2100 ml  Net -1380 ml   Last 3 Weights 06/09/2021 06/08/2021 06/07/2021  Weight (lbs) 249 lb 1.6 oz 250 lb 10.6 oz 256 lb 2.8 oz  Weight (kg) 112.991 kg 113.7 kg 116.2 kg      Telemetry   Vpaced - Personally Reviewed  ECG    No new tracing.  Physical Exam   GEN: No acute distress.   Neck: No JVD Cardiac: RRR, no murmurs, rubs, or gallops.  Respiratory: Clear to auscultation bilaterally. GI: Soft, nontender, non-distended  MS: No edema; No deformity. Neuro:   Nonfocal  Psych: Normal affect   Labs    High Sensitivity Troponin:  No results for input(s): TROPONINIHS in the last 720 hours.    Chemistry Recent Labs  Lab 06/06/21 0156 06/07/21 0209 06/08/21 0816  NA 135 135 136  K 4.1 4.0 3.9  CL 105 105 107  CO2 19* 20* 19*  GLUCOSE 109* 79 94  BUN 77* 77* 73*  CREATININE 6.32* 5.70* 4.77*  CALCIUM 9.6 9.9 9.9  PROT  --   --  5.4*  ALBUMIN  --   --  2.8*  AST  --   --  37  ALT  --   --  68*  ALKPHOS  --   --  73  BILITOT  --   --  1.1  GFRNONAA 9* 10* 13*  ANIONGAP 11 10 10      Hematology Recent Labs  Lab 06/05/21 0500 06/06/21 0156 06/07/21 0209  WBC 10.8* 10.3 10.1  RBC 3.70* 3.79* 3.87*  HGB 11.6* 11.7* 11.9*  HCT 33.9* 34.6* 35.9*  MCV 91.6 91.3 92.8  MCH 31.4 30.9 30.7  MCHC 34.2 33.8 33.1  RDW 13.3 13.3 13.3  PLT 137* 160 203  BNPNo results for input(s): BNP, PROBNP in the last 168 hours.   DDimer No results for input(s): DDIMER in the last 168 hours.   Radiology    No results found.  Cardiac Studies   2D echocardiogram (06/01/2021)   1. Left ventricular ejection fraction, by estimation, is 35 to 40%. The  left ventricle has moderately decreased function. The left ventricle has  no regional wall motion abnormalities. There is severe concentric left  ventricular hypertrophy. Left  ventricular diastolic parameters are indeterminate. Elevated left  ventricular end-diastolic pressure. There is akinesis of the left  ventricular, mid anteroseptal wall. There is akinesis of the left  ventricular, apical septal wall, inferior wall and  anterior wall. There is akinesis of the left ventricular, apical segment.   2. Right ventricular systolic function was not well visualized. The right  ventricular size is mildly enlarged. Tricuspid regurgitation signal is  inadequate for assessing PA pressure.   3. Right atrial size was severely dilated.   4. The mitral valve is normal in structure. Trivial mitral valve   regurgitation. No evidence of mitral stenosis.   5. The aortic valve is tricuspid. Aortic valve regurgitation is not  visualized. Mild aortic valve sclerosis is present, with no evidence of  aortic valve stenosis.   6. Aortic dilatation noted. There is mild dilatation of the aortic root,  measuring 41 mm.   7. The inferior vena cava is dilated in size with >50% respiratory  variability, suggesting right atrial pressure of 8 mmHg.   Cardiac catheterization (06/01/2019)      Prox RCA to Mid RCA lesion is 99% stenosed. Dist RCA lesion is 80% stenosed. Ramus lesion is 100% stenosed. Origin to Prox Graft lesion is 100% stenosed. Origin lesion is 100% stenosed. Prox LAD lesion is 70% stenosed. Prox LAD to Mid LAD lesion is 70% stenosed. Mid LAD-1 lesion is 90% stenosed. Mid LAD-2 lesion is 80% stenosed.   VF cardiac arrest with prolonged resuscitative effort and ultimate restoration of paced rhythm following multiple shocks, amiodarone, and Levophed infusion.   Severe native CAD with diffuse severe proximal LAD stenoses of 70 to 80% with focal 90% stenosis between the first and second septal perforating artery with total occlusion of the first diagonal vessel and a flush and fill phenomena of the mid LAD secondary to competitive filling via the LIMA graft.   Old occlusion of the ramus immediate vessel and obtuse marginal vessel   Dominant RCA with previously noted  diffuse 90% mid stenosis which now has right to right collateralization and diffuse 80% distal stenosis.   Patent LIMA graft supplying the mid LAD.   Patent SVG supplying the first diagonal vessel.   Old occlusion of the vein graft to a ramus intermediate vessel.   Old occlusion of the vein graft which had supplied the distal RCA.   LVEDP 20 mmHg   RECOMMENDATION: Patient is admitted to the critical care service.  We will plan 2D echo Doppler study in a.m.  He has been seen by Dr. Rayann Heman in the emergency room during his  prolonged resuscitative efforts today.  Guideline directed medical therapy for CHF.  Anticipate ICD implantation per electrophysiology.   Coronary Diagrams     Diagnostic Dominance: Right       Patient Profile     65 y.o. male  with known ischemic CM (EF 25%), prior CABG, permanent afib, and DM who is admitted with VF arrest.  Assessment & Plan    Status post VF arrest:  patient had witnessed cardiac arrest, defibrillation x10 and CPR with ROSC.  He was intubated and transported to [facility].  Cardiac catheterization performed by Dr. Claiborne Billings on 05/31/2021 at the time of presentation showed a patent vein to ramus branch, patent LIMA to the LAD, occluded vein to the RCA with occluded native RCA and occluded vein to a diagonal branch.  Medical therapy was recommended for this. There were no "culprit lesions".   -- treated with IV amiodarone which has been converted to p.o. load.  Remains hemodynamically stable  Ischemic cardiomyopathy: EF in the 30% range in the past and currently 35 to 40% by 2D echo.  He was on guideline directed optimal medical therapy prior to admission followed by Dr. Tamala Julian.  -- Coreg has been resumed and titrated to 6.25mg  BID. Unable to add ACE/ARB/spiro with renal dysfunction  Atrial fibrillation: history of permanent A. fib on Eliquis oral anticoagulation as an outpatient.  V-paced  -- on Eliquis 5mg  BID  Secondary AV block: history of permanent transvenous pacemaker placed in the past for this. -- Plan for CRT upgrade at some point in the future according to Dr. Curt Bears although now given his skin necrosis with the need for plastic surgical intervention and underlying infection the plan is to ultimately send him home on a LifeVest and readdress pacemaker upgrade in the future. Will need to arrange prior to discharge. Will start paperwork today.  Acute renal insufficiency: serum creatinine increased from 1.2-6.7 probably related to ATN from his acute event.  Seen by nephrology, now signed off. Cr down to  4.77.  Left upper extremity skin necrosis: probably related to IV infiltration of his norepinephrine.  Seen by plastic surgery, outpatient follow up   For questions or updates, please contact [facility] Please consult [email or web address] for contact info under        Signed, Reino Bellis, NP  06/09/2021, 10:07 AM     Patient seen and examined. Agree with assessment and plan.  Patient is remarkably alert considering his prolonged resuscitative effort on May 31, 2021.  Following almost a 2-hour code, I took him to the cardiac catheterization laboratory when he became somewhat alert intubated and catheterization did not show any significant change with a patent LIMA to LAD, and SVG supplying the first diagonal vessel and he had previous old occlusion of the graft supplying the ramus intermediate and graft which had supplied the distal RCA.  He will ultimately need ICD implantation with upgrade of his pacemaker.  However due to necrosis will need Lasix surgical intervention and treatment of his infection.  I discussed with him the need for a LifeVest post discharge until he can have his CRT upgrade.  In is slowly improving from his AKI/ATN secondary to his cardiac arrest.  Atrial fibrillation rate is controlled which is chronic and he is on Eliquis.  EF 35 to 40% on most recent echo.  He may be a candidate for initiation of BiDil with isosorbide mononitrate/hydralazine since at present we cannot initiate ARB or Entresto therapy.  He is now on amiodarone 400 mg twice daily 2 weeks prior to dose reduction.   Troy Sine, MD, [facility] 06/09/2021 11:53 AM

## 2021-06-09 NOTE — Progress Notes (Signed)
Occupational Therapy Treatment Patient Details Name: Ross [name] MRN: [id number] DOB: January 10, 1956 Today's Date: 06/09/2021    History of present illness 65 yo admitted 6/11 after Vfib cardiac arrest at home with greater than an hour resuscitation and 10+ shocks, cath demonstrating severe CAD. Extubated 6/12. L UE/LE skin necrosis- wounds dressed by plastic surgery. PMhx: cardiomyopathy, CABG, CHF, DM, HTN, PPM, Afib, obesity, neuropathy.   OT comments  Pt seen in conjunction with PT for functional mobility progression. Pt requesting to get his hair washed. Pt continues to present with increased pain, impaired balance, and decreased activity tolerance. Pt currently requires MAX A for UB bathing tasks as pt unable use LUE to wash hair d/t wounds on LUE. Pt with heavy lean on sink during ADLs needing min guard +2 for safety. Pt would continue to benefit from skilled occupational therapy while admitted and after d/c to address the below listed limitations in order to improve overall functional mobility and facilitate independence with BADL participation. DC plan remains appropriate, will follow acutely per POC.    Follow Up Recommendations  CIR;Supervision/Assistance - 24 hour    Equipment Recommendations  Other (comment) (TBD at next venue of care)    Recommendations for Other Services      Precautions / Restrictions Precautions Precautions: Fall Precaution Comments: monitor BP/orthostatic symptoms Restrictions Weight Bearing Restrictions: No       Mobility Bed Mobility Overal bed mobility: Needs Assistance             General bed mobility comments: up in chair    Transfers Overall transfer level: Needs assistance Equipment used: 2 person hand held assist Transfers: Sit to/from Stand Sit to Stand: Min assist;+2 physical assistance;+2 safety/equipment         General transfer comment: Min +2 for rise, hip clearance off of recliner, steadying upon standing. STS x4, from  recliner x2 and BSC x2.    Balance Overall balance assessment: Needs assistance Sitting-balance support: No upper extremity supported;Feet supported Sitting balance-Leahy Scale: Fair     Standing balance support:  (pt does not come to erect standing posture during session) Standing balance-Leahy Scale: Poor Standing balance comment: reliant on AD or external support.                           ADL either performed or assessed with clinical judgement   ADL Overall ADL's : Needs assistance/impaired     Grooming: Standing;Min guard Grooming Details (indicate cue type and reason): leaning on sink to trim mustache Upper Body Bathing: Maximal assistance;Sitting Upper Body Bathing Details (indicate cue type and reason): to wash pts hair, pt unable to use LUE to wash L side of head d/t wounds on LUE         Lower Body Dressing: Maximal assistance;Sit to/from stand Lower Body Dressing Details (indicate cue type and reason): to don socks Toilet Transfer: Minimal assistance;+2 for physical assistance;+2 for safety/equipment;RW;Ambulation Toilet Transfer Details (indicate cue type and reason): simulated via functional mobility         Functional mobility during ADLs: Minimal assistance;+2 for physical assistance;+2 for safety/equipment;Rolling walker General ADL Comments: pt continues to present with impaired activity tolerance, increased pain, and generalized deconditioning     Vision       Perception     Praxis      Cognition Arousal/Alertness: Awake/alert Behavior During Therapy: Anxious;Flat affect (flat at times) Overall Cognitive Status: Impaired/Different from baseline Area of Impairment: Following commands;Problem solving;Attention;Safety/judgement;Awareness  Current Attention Level: Sustained Memory: Decreased recall of precautions Following Commands: Follows one step commands with increased time Safety/Judgement: Decreased  awareness of safety;Decreased awareness of deficits Awareness: Intellectual Problem Solving: Slow processing;Requires verbal cues;Requires tactile cues General Comments: pt states "my left leg is weak and sore, I don't even know why" even though he has necrotic wounds being addressed by plastics. Pt limited by anxiety, comes off as short and irritable. Requires step-by-step cues for functional tasks           Shoulder Instructions       General Comments helped pt wash hair from shower seat and hand help shower head    Pertinent Vitals/ Pain       Pain Assessment: Faces Faces Pain Scale: Hurts even more Pain Location: chest, mostly LLE and UE Pain Descriptors / Indicators: Aching;Discomfort;Sore Pain Intervention(s): Limited activity within patient's tolerance;Monitored during session;Repositioned  Home Living                                          Prior Functioning/Environment              Frequency  Min 2X/week        Progress Toward Goals  OT Goals(current goals can now be found in the care plan section)  Progress towards OT goals: Progressing toward goals  Acute Rehab OT Goals Patient Stated Goal: to return to independence OT Goal Formulation: With patient Time For Goal Achievement: 06/18/21 Potential to Achieve Goals: Good  Plan Discharge plan remains appropriate;Frequency remains appropriate    Co-evaluation      Reason for Co-Treatment: For patient/therapist safety;To address functional/ADL transfers;Complexity of the patient's impairments (multi-system involvement) PT goals addressed during session: Mobility/safety with mobility;Balance;Proper use of DME OT goals addressed during session: ADL's and self-care      AM-PAC OT "6 Clicks" Daily Activity     Outcome Measure   Help from another person eating meals?: None Help from another person taking care of personal grooming?: A Little Help from another person toileting, which  includes using toliet, bedpan, or urinal?: A Lot Help from another person bathing (including washing, rinsing, drying)?: A Lot Help from another person to put on and taking off regular upper body clothing?: A Little Help from another person to put on and taking off regular lower body clothing?: A Lot 6 Click Score: 16    End of Session Equipment Utilized During Treatment: Gait belt;Rolling walker  OT Visit Diagnosis: Other abnormalities of gait and mobility (R26.89);Muscle weakness (generalized) (M62.81);Other symptoms and signs involving cognitive function;Pain Pain - Right/Left: Left Pain - part of body: Leg;Arm (chest)   Activity Tolerance Patient tolerated treatment well   Patient Left in chair;with call bell/phone within reach;with chair alarm set   Nurse Communication Mobility status        Time: 5035-4656 OT Time Calculation (min): 38 min  Charges: OT General Charges $OT Visit: 1 Visit OT Treatments $Self Care/Home Management : 23-37 mins  [place] Ross [name] Acute Rehabilitation Services [phone]    Precious Haws 06/09/2021, 1:46 PM

## 2021-06-09 NOTE — Plan of Care (Signed)
?  Problem: Education: ?Goal: Knowledge of General Education information will improve ?Description: Including pain rating scale, medication(s)/side effects and non-pharmacologic comfort measures ?Outcome: Progressing ?  ?Problem: Health Behavior/Discharge Planning: ?Goal: Ability to manage health-related needs will improve ?Outcome: Progressing ?  ?Problem: Coping: ?Goal: Level of anxiety will decrease ?Outcome: Progressing ?  ?

## 2021-06-09 NOTE — Progress Notes (Signed)
Inpatient Rehab Admissions Coordinator:   Chart reviewed and note pt will not need dialysis.  I will start insurance authorization process today, will need notes from 2 disciplines.  I left voicemail for daughter and will stop by to update at bedside this afternoon.   Shann Medal, PT, DPT Admissions Coordinator [phone] 06/09/21  1:48 PM

## 2021-06-09 NOTE — TOC Progression Note (Signed)
Transition of Care [facility]) - Progression Note    Patient Details  Name: Ross [name] MRN: [id number] Date of Birth: 1956/12/16  [facility] CM/SW Contact  Zenon Mayo, RN Phone Number: 06/09/2021, 3:37 PM  Clinical Narrative:    NCM and CSW explained the difference between SNF and CIR.  Also Caitlen with CIR explained about the co pay for CIR.  For CIR his copay would be 295.00 aproximatelly for 5 days, and then after the 5 days he would not have to pay a copay, he can go there with the life vest and he would get aggressive therapy for 3 hrs per day.  For SNF the first 20 days does not have a copay, but starting at day 21 copay of 188 or could be more depending on SNF starts daily and he would get 1.5 hrs of therapy a day.. Since he has a life vest there are certain SNF's that will take patient's with a lifevest.  A list was sent to NCM from [place] with [employer] life vest. Most of them are far away like [place] and [place], also patient would probably have to pay for transportation to get there since the SNF would be futher away.  Patient states he would likely benefit from CIR so he he prefers CIR.        Expected Discharge Plan and Services                                                 Social Determinants of Health (SDOH) Interventions    Readmission Risk Interventions No flowsheet data found.

## 2021-06-09 NOTE — Progress Notes (Signed)
Physical Therapy Treatment Patient Details Name: Ross [name] MRN: [id number] DOB: 1956/08/20 Today's Date: 06/09/2021    History of Present Illness 65 yo admitted 6/11 after Vfib cardiac arrest at home with greater than an hour resuscitation and 10+ shocks, cath demonstrating severe CAD. Extubated 6/12. L UE/LE skin necrosis- wounds dressed by plastic surgery. PMhx: cardiomyopathy, CABG, CHF, DM, HTN, PPM, Afib, obesity, neuropathy.    PT Comments    Pt anxious about mobility secondary to LLE pain and dizziness with mobility, agreeable with both PT and COTA assisting. Pt expresses goals to "be normal again", and wash his hair this session. Pt requiring min-mod +2 assist for repeated transfers during session, as well as short distance gait. Pt reporting dizziness during second bout of gait, subsided upon sitting and suspect related to anxiety with mobility. PT educated pt on LE exercises to address LLE edema and weakness, tolerated well. Will continue to follow.    Follow Up Recommendations  CIR;Supervision/Assistance - 24 hour     Equipment Recommendations  Rolling walker with 5" wheels    Recommendations for Other Services       Precautions / Restrictions Precautions Precautions: Fall Precaution Comments: monitor BP/orthostatic symptoms Restrictions Weight Bearing Restrictions: No    Mobility  Bed Mobility Overal bed mobility: Needs Assistance             General bed mobility comments: up in chair    Transfers Overall transfer level: Needs assistance Equipment used: 2 person hand held assist Transfers: Sit to/from Stand Sit to Stand: Min assist;+2 physical assistance;+2 safety/equipment         General transfer comment: Min +2 for rise, hip clearance off of recliner, steadying upon standing. STS x4, from recliner x2 and BSC x2.  Ambulation/Gait Ambulation/Gait assistance: +2 safety/equipment Gait Distance (Feet): 10 Feet (+5) Assistive device: Rolling  walker (2 wheeled) Gait Pattern/deviations: Step-through pattern;Decreased stride length;Trunk flexed;Antalgic;Decreased weight shift to left Gait velocity: decr   General Gait Details: Min assist for steadying, placement in RW, upright posture. 10+5 ft with RW, pt complaining of dizziness on second short bout of gait but subsided immediately upon sitting, suspect anxiety.   Stairs             Wheelchair Mobility    Modified Rankin (Stroke Patients Only)       Balance Overall balance assessment: Needs assistance Sitting-balance support: No upper extremity supported;Feet supported Sitting balance-Leahy Scale: Fair     Standing balance support:  (pt does not come to erect standing posture during session)   Standing balance comment: reliant on AD or external support.                            Cognition Arousal/Alertness: Awake/alert Behavior During Therapy: Anxious;Flat affect (flat at times) Overall Cognitive Status: Impaired/Different from baseline Area of Impairment: Following commands;Problem solving;Attention                   Current Attention Level: Sustained Memory: Decreased recall of precautions Following Commands: Follows one step commands with increased time Safety/Judgement: Decreased awareness of safety;Decreased awareness of deficits Awareness: Intellectual Problem Solving: Slow processing;Requires verbal cues;Requires tactile cues General Comments: pt states "my left leg is weak and sore, I don't even know why" even though he has necrotic wounds being addressed by plastics. Pt limited by anxiety, comes off as short and irritable. Requires step-by-step cues for functional tasks      Exercises General Exercises -  Lower Extremity Ankle Circles/Pumps: AROM;Left;Seated Long Arc Quad: AROM;Left;10 reps;Seated    General Comments General comments (skin integrity, edema, etc.): Assisted COTA in heleping pt wash hair sitting in BSC, with  handheld shower head      Pertinent Vitals/Pain Pain Assessment: Faces Faces Pain Scale: Hurts even more Pain Location: chest, mostly LLE and UE Pain Descriptors / Indicators: Aching;Discomfort;Sore Pain Intervention(s): Limited activity within patient's tolerance;Monitored during session;Repositioned    Home Living                      Prior Function            PT Goals (current goals can now be found in the care plan section) Acute Rehab PT Goals Patient Stated Goal: to return to independence PT Goal Formulation: With patient Time For Goal Achievement: 06/18/21 Potential to Achieve Goals: Good Progress towards PT goals: Progressing toward goals    Frequency    Min 3X/week      PT Plan Current plan remains appropriate    Co-evaluation PT/OT/SLP Co-Evaluation/Treatment: Yes Reason for Co-Treatment: For patient/therapist safety;To address functional/ADL transfers;Complexity of the patient's impairments (multi-system involvement) PT goals addressed during session: Mobility/safety with mobility;Balance;Proper use of DME        AM-PAC PT "6 Clicks" Mobility   Outcome Measure  Help needed turning from your back to your side while in a flat bed without using bedrails?: A Little Help needed moving from lying on your back to sitting on the side of a flat bed without using bedrails?: A Little Help needed moving to and from a bed to a chair (including a wheelchair)?: A Little Help needed standing up from a chair using your arms (e.g., wheelchair or bedside chair)?: A Little Help needed to walk in hospital room?: A Lot Help needed climbing 3-5 steps with a railing? : A Lot 6 Click Score: 16    End of Session Equipment Utilized During Treatment: Gait belt Activity Tolerance: Patient tolerated treatment well;Patient limited by fatigue Patient left: in chair;with call bell/phone within reach;with chair alarm set Nurse Communication: Mobility status PT Visit  Diagnosis: Unsteadiness on feet (R26.81);Other abnormalities of gait and mobility (R26.89);Difficulty in walking, not elsewhere classified (R26.2);Pain Pain - part of body: Arm;Leg     Time: 1052-1130 PT Time Calculation (min) (ACUTE ONLY): 38 min  Charges:  $Gait Training: 8-22 mins                     Stacie Glaze, PT DPT Acute Rehabilitation Services Pager [phone]  Office [phone]    [place] 06/09/2021, 11:43 AM

## 2021-06-09 NOTE — Progress Notes (Signed)
    Order for lifevest placed and rep notified.   SignedReino Bellis, NP-C 06/09/2021, 12:30 PM

## 2021-06-09 NOTE — Progress Notes (Signed)
Ross [name] PROGRESS NOTE    Ross [name]  NOB:[id number] DOB: [birth year] DOA: 05/31/2021 PCP: Orpah Melter, MD      Brief Narrative:  Ross [name] is a 65 y.o. M with hx AF and 2nd deg AVB with PPM, sCHF EF 25%, stable angina, MO, COPD not on O2, CAD s/pCABG 2007, HTN, and DM who presented with witnessed out of hospital cardiac arrest.    Defibrillated x10, amiodarone 450 and over an hour CPR time.  After which he was able to follow commands, taken to the Cath lab and found to have diffuse disease.   6/11: Cardiac arrest, intubated, underwent left heart cath 6/12: Extubated 6/16: Cr peaked at 6.8 6/18: Nephrology signed off        Assessment & Plan:  Prolonged out of hospital VT/VF cardiac arrest Cardiac arrest versus initial presenting problem Urgent left heart cath showed 2 occluded grafts from his old CABG and medical therapy was recommended.  He had remarkable neurological recovery shortly after code, extubated next day.  ICD is indicated, and EP recommend that his necrosis of the skin due to extravasation of pressors must resolve prior to ICD upgrade.  -Plan for LifeVest at discharge - Follow-up with electrophysiology after discharge    Cognitive impairment, likely post-cardiac arrest anoxic injury Patient noted to have processing deficits, memory impairment.  SLUMS here on 6/17 shows 23 out of 30, mostly in working memory and short-term memory tasks.  This appears to be slowly improving, he would benefit from skilled speech rehabilitation     Acute renal failure Cr 1.1-1.2 at baseline.  Here his creatinine was initially 2.6, trended up to 6.8, peaked on 6/17 and is now trending down.  Made 2.4 L urine yesterday, nephrology have signed off.  No need for dialysis. - Continue strict I's and O's - Avoid nephrotoxins  -Trend Cr    Skin necrosis of the left arm and knee due to extravasation of Levophed Plastic surgery were consulted, they  recommend Xeroform daily, clinical monitoring, no condition plan for excision and surgical debridement - Outpatient plastic surgery follow-up needed      Paroxysmal atrial fibrillation History of second-degree AV block with PPM Rate normal - Continue telemetry - Continue amiodarone, carvedilol, apixaban - Plan for amiodarone 400 BID for 2 weeks until Jun 29 then 200 daily  COPD No active disease - Resume home ICS/LAMA/LAMA at discharge  Diabetes Well-controlled - Continue sliding scale corrections  Hypertension Chronic systolic CHF/ischemic cardiomyopathy Coronary artery disease Appears euvolemic - Continue aspirin, atorvastatin, carvedilol, Bidil - Hold Entresto, Farxiga until renal function improves    Anemia of critical illness Mild, no clinical bleeding    Resolved issues: Post cardiac arrest cardiogenic shock Postarrest metabolic acidosis due to lactic acidosis Resolved  Acute respiratory failure with hypoxia due to cardiac arrest Resolved  Hyponatremia ruled out, mild transient low sodium, clinically insignificant  Possible aspiration pneumonia Completed 5 days Unasyn, leukocytosis and fever resolved.    Metabolic acidosis Resolved  Orthostasis Resolved      Disposition: Status is: Inpatient  Remains inpatient appropriate because: renal function worsening, unclear disposition for this cognitively impaired patient.  Dispo: The patient is from: Home              Anticipated d/c is to: SNF              Patient currently is not medically stable to d/c.   Difficult to place patient No    Patient admitted for  VF arrest, has had impressive recovery has combined renal failure and cognitive impairment.    He is stabilized from a cardiac standpoint, needs EP follow up for ICD and Plastic Surgery follow up for wounds from Levophed and Cards follow up for CHF.  Once his renal function starts to improve and nephrology feel that he will stable and not  need dialysis, we will pursue rehabilitation placement.   Level of care: Telemetry Cardiac       MDM: The below labs and imaging reports were reviewed and summarized above.  Medication management as above.  This is a severe illnes with threat to life.     DVT prophylaxis: SCD's Start: 05/31/21 2150 apixaban (ELIQUIS) tablet 5 mg  Code Status: FULL         Subjective: His confusion is slightly improved.  He has no more orthostasis.  He still has pain in the chest and pain in the left forearm and shin where his skin wounds are.  Objective: Vitals:   06/09/21 0004 06/09/21 0401 06/09/21 0834 06/09/21 1205  BP:  (!) 155/68 (!) 159/67 (!) 148/79  Pulse:  (!) 59 61 61  Resp:  20  18  Temp:  98.9 F (37.2 C)  98.8 F (37.1 C)  TempSrc:  Oral  Oral  SpO2:  97%  97%  Weight: 113 kg     Height:        Intake/Output Summary (Last 24 hours) at 06/09/2021 1319 Last data filed at 06/09/2021 1040 Gross per 24 hour  Intake 720 ml  Output 2300 ml  Net -1580 ml   Filed Weights   06/07/21 0458 06/08/21 0434 06/09/21 0004  Weight: 116.2 kg 113.7 kg 113 kg    Examination: General appearance:  adult male, alert and in no obvious distress.   HEENT: Anicteric, conjunctiva pink, lids and lashes normal. No nasal deformity, discharge, epistaxis.  Lips moist.   Skin: Warm and dry.  no jaundice.  No suspicious rashes or lesions. Cardiac: RRR, nl D3-O6, soft systolic murmur.  No lower extremity edema.  Severe precordial pain to palpation.  Respiratory: Normal respiratory rate and rhythm.  CTAB without rales or wheezes. Abdomen: Abdomen soft.  No TTP or guarding. No ascites, distension, hepatosplenomegaly.   MSK: Left leg contracture due to posterior knee wound.  Left arm wrapped, no surrounding cellulitis. Neuro: Awake and alert.  EOMI, moves all extremities neurolysed weakness. Speech fluent.    Psych: Sensorium intact and responding to questions, attention normal. Affect blunted.   Judgment and insight appear impaired.    Data Reviewed: I have personally reviewed following labs and imaging studies:  CBC: Recent Labs  Lab 06/03/21 0346 06/04/21 0503 06/05/21 0500 06/06/21 0156 06/07/21 0209  WBC 13.4* 9.9 10.8* 10.3 10.1  HGB 11.8* 11.0* 11.6* 11.7* 11.9*  HCT 36.4* 33.2* 33.9* 34.6* 35.9*  MCV 95.3 93.8 91.6 91.3 92.8  PLT 114* 111* 137* 160 712   Basic Metabolic Panel: Recent Labs  Lab 06/05/21 0500 06/06/21 0156 06/07/21 0209 06/08/21 0816 06/09/21 0908  NA 135 135 135 136 138  K 4.2 4.1 4.0 3.9 3.7  CL 106 105 105 107 108  CO2 19* 19* 20* 19* 21*  GLUCOSE 113* 109* 79 94 130*  BUN 73* 77* 77* 73* 65*  CREATININE 6.78* 6.32* 5.70* 4.77* 3.97*  CALCIUM 9.1 9.6 9.9 9.9 9.9   GFR: Estimated Creatinine Clearance: 24.3 mL/min (A) (by C-G formula based on SCr of 3.97 mg/dL (H)). Liver Function Tests: Recent  Labs  Lab 06/08/21 0816  AST 37  ALT 68*  ALKPHOS 73  BILITOT 1.1  PROT 5.4*  ALBUMIN 2.8*   No results for input(s): LIPASE, AMYLASE in the last 168 hours. No results for input(s): AMMONIA in the last 168 hours. Coagulation Profile: No results for input(s): INR, PROTIME in the last 168 hours. Cardiac Enzymes: No results for input(s): CKTOTAL, CKMB, CKMBINDEX, TROPONINI in the last 168 hours. BNP (last 3 results) No results for input(s): PROBNP in the last 8760 hours. HbA1C: No results for input(s): HGBA1C in the last 72 hours. CBG: Recent Labs  Lab 06/08/21 1101 06/08/21 1601 06/08/21 2108 06/09/21 0606 06/09/21 1201  GLUCAP 118* 142* 134* 104* 110*   Lipid Profile: No results for input(s): CHOL, HDL, LDLCALC, TRIG, CHOLHDL, LDLDIRECT in the last 72 hours. Thyroid Function Tests: No results for input(s): TSH, T4TOTAL, FREET4, T3FREE, THYROIDAB in the last 72 hours. Anemia Panel: No results for input(s): VITAMINB12, FOLATE, FERRITIN, TIBC, IRON, RETICCTPCT in the last 72 hours. Urine analysis:    Component Value  Date/Time   COLORURINE YELLOW 06/07/2021 1501   APPEARANCEUR CLEAR 06/07/2021 1501   LABSPEC 1.012 06/07/2021 1501   PHURINE 5.0 06/07/2021 1501   GLUCOSEU 50 (A) 06/07/2021 1501   HGBUR MODERATE (A) 06/07/2021 1501   BILIRUBINUR NEGATIVE 06/07/2021 1501   KETONESUR NEGATIVE 06/07/2021 1501   PROTEINUR NEGATIVE 06/07/2021 1501   NITRITE NEGATIVE 06/07/2021 1501   LEUKOCYTESUR NEGATIVE 06/07/2021 1501   Sepsis Labs: @LABRCNTIP (procalcitonin:4,lacticacidven:4)  ) Recent Results (from the past 240 hour(s))  C Difficile Quick Screen w PCR reflex     Status: None   Collection Time: 06/01/21 10:22 AM   Specimen: STOOL  Result Value Ref Range Status   C Diff antigen NEGATIVE NEGATIVE Final   C Diff toxin NEGATIVE NEGATIVE Final   C Diff interpretation No C. difficile detected.  Final    Comment: Performed at [facility], [place] [address], [place], [place] [address]  MRSA Next Gen by PCR, Nasal     Status: None   Collection Time: 06/02/21  8:30 AM  Result Value Ref Range Status   MRSA by PCR Next Gen NOT DETECTED NOT DETECTED Final    Comment: (NOTE) The GeneXpert MRSA Assay (FDA approved for NASAL specimens only), is one component of a comprehensive MRSA colonization surveillance program. It is not intended to diagnose MRSA infection nor to guide or monitor treatment for MRSA infections. Test performance is not FDA approved in patients less than 90 years old. Performed at [facility], [place] [address] [place],  [address]   Culture, Urine     Status: None   Collection Time: 06/02/21  2:31 PM   Specimen: Urine, Catheterized  Result Value Ref Range Status   Specimen Description URINE, CATHETERIZED  Final   Special Requests NONE  Final   Culture   Final    NO GROWTH Performed at [facility], [address], [place],  [address]    Report Status 06/03/2021 FINAL  Final  Culture, blood (routine x 2)     Status: None   Collection Time: 06/02/21   4:59 PM   Specimen: BLOOD RIGHT HAND  Result Value Ref Range Status   Specimen Description BLOOD RIGHT HAND  Final   Special Requests   Final    BOTTLES DRAWN AEROBIC ONLY Blood Culture results may not be optimal due to an inadequate volume of blood received in culture bottles  Culture   Final    NO GROWTH 5 DAYS Performed at [facility], [place] [address], [place], [place] [address]    Report Status 06/07/2021 FINAL  Final  Culture, blood (routine x 2)     Status: None   Collection Time: 06/02/21  5:08 PM   Specimen: BLOOD RIGHT HAND  Result Value Ref Range Status   Specimen Description BLOOD RIGHT HAND  Final   Special Requests   Final    BOTTLES DRAWN AEROBIC ONLY Blood Culture results may not be optimal due to an inadequate volume of blood received in culture bottles   Culture   Final    NO GROWTH 5 DAYS Performed at [facility], [place] [address], [place], [place] [address]    Report Status 06/07/2021 FINAL  Final  SARS CORONAVIRUS 2 (TAT 6-24 HRS) Nasopharyngeal Nasopharyngeal Swab     Status: None   Collection Time: 06/04/21  4:02 PM   Specimen: Nasopharyngeal Swab  Result Value Ref Range Status   SARS Coronavirus 2 NEGATIVE NEGATIVE Final    Comment: (NOTE) SARS-CoV-2 target nucleic acids are NOT DETECTED.  The SARS-CoV-2 RNA is generally detectable in upper and lower respiratory specimens during the acute phase of infection. Negative results do not preclude SARS-CoV-2 infection, do not rule out co-infections with other pathogens, and should not be used as the sole basis for treatment or other patient management decisions. Negative results must be combined with clinical observations, patient history, and epidemiological information. The expected result is Negative.  Fact Sheet for Patients: SugarRoll.be  Fact Sheet for Healthcare Providers: https://www.woods-mathews.com/  This test is not yet approved or cleared by  the [place] FDA and  has been authorized for detection and/or diagnosis of SARS-CoV-2 by FDA under an Emergency Use Authorization (EUA). This EUA will remain  in effect (meaning this test can be used) for the duration of the COVID-19 declaration under Se ction 564(b)(1) of the Act, 21 U.S.C. section 360bbb-3(b)(1), unless the authorization is terminated or revoked sooner.  Performed at [facility], [place] [address] [place],  [address]          Radiology Studies: No results found.      Scheduled Meds:  amiodarone  400 mg Oral BID   apixaban  5 mg Oral BID   aspirin  81 mg Oral Daily   atorvastatin  80 mg Oral Daily   carvedilol  6.25 mg Oral BID WC   insulin aspart  0-9 Units Subcutaneous TID WC   isosorbide-hydrALAZINE  1 tablet Oral TID   mouth rinse  15 mL Mouth Rinse BID   polyethylene glycol  17 g Oral Daily   senna-docusate  1 tablet Oral BID   sodium chloride flush  10-40 mL Intracatheter Q12H   sodium chloride flush  3 mL Intravenous Q12H   Continuous Infusions:    LOS: 9 days    Time spent: 25 minutes    Edwin Dada, MD [name] 06/09/2021, 1:19 PM     Please page though [place] or Epic secure chat:  For [employer], Adult nurse

## 2021-06-10 DIAGNOSIS — I96 Gangrene, not elsewhere classified: Secondary | ICD-10-CM

## 2021-06-10 DIAGNOSIS — I469 Cardiac arrest, cause unspecified: Secondary | ICD-10-CM | POA: Diagnosis not present

## 2021-06-10 DIAGNOSIS — I255 Ischemic cardiomyopathy: Secondary | ICD-10-CM | POA: Diagnosis not present

## 2021-06-10 DIAGNOSIS — Z951 Presence of aortocoronary bypass graft: Secondary | ICD-10-CM | POA: Diagnosis not present

## 2021-06-10 DIAGNOSIS — I4819 Other persistent atrial fibrillation: Secondary | ICD-10-CM | POA: Diagnosis not present

## 2021-06-10 LAB — GLUCOSE, CAPILLARY
Glucose-Capillary: 91 mg/dL (ref 70–99)
Glucose-Capillary: 109 mg/dL — ABNORMAL HIGH (ref 70–99)
Glucose-Capillary: 117 mg/dL — ABNORMAL HIGH (ref 70–99)
Glucose-Capillary: 109 mg/dL — ABNORMAL HIGH (ref 70–99)

## 2021-06-10 LAB — BASIC METABOLIC PANEL WITH GFR
Anion gap: 7 (ref 5–15)
BUN: 59 mg/dL — ABNORMAL HIGH (ref 8–23)
CO2: 22 mmol/L (ref 22–32)
Calcium: 10.2 mg/dL (ref 8.9–10.3)
Chloride: 108 mmol/L (ref 98–111)
Creatinine, Ser: 3.51 mg/dL — ABNORMAL HIGH (ref 0.61–1.24)
GFR, Estimated: 18 mL/min — ABNORMAL LOW
Glucose, Bld: 102 mg/dL — ABNORMAL HIGH (ref 70–99)
Potassium: 3.9 mmol/L (ref 3.5–5.1)
Sodium: 137 mmol/L (ref 135–145)

## 2021-06-10 LAB — APTT: aPTT: 32 s (ref 24–36)

## 2021-06-10 MED ORDER — MELATONIN 3 MG PO TABS: 3.0000 mg | ORAL_TABLET | Freq: Every evening | ORAL | Status: DC | PRN

## 2021-06-10 MED ORDER — ISOSORB DINITRATE-HYDRALAZINE 20-37.5 MG PO TABS
1.5000 | ORAL_TABLET | Freq: Three times a day (TID) | ORAL | Status: DC
  Administered 2021-06-10 – 2021-06-12 (×6): 1.5 via ORAL
  Filled 2021-06-10 (×6): qty 2

## 2021-06-10 NOTE — Progress Notes (Signed)
Inpatient Rehab Admissions Coordinator:   Spoke to pt's daughter, Levada Dy, over the phone to update her on insurance process.  Reviewed benefits.  She visited with pt yesterday at bedside and confirms all are in agreement to pursue CIR.  Will follow and update once I have a determination from insurance.   Shann Medal, PT, DPT Admissions Coordinator [phone] 06/10/21  12:01 PM

## 2021-06-10 NOTE — Progress Notes (Signed)
10 Days Post-Op  Subjective: 65 year old male with left lower extremity wound and left upper extremity wound after infiltration.  Family at bedside.  He reports that he feels as if he has normal range of motion and strength to his left upper extremity.  He feels as if he has normal grip strength of his left upper extremity with the exception of decreased strength due to pain.  He is not having any pain at rest  Objective: Vital signs in last 24 hours: Temp:  [97.9 F (36.6 C)-98.5 F (36.9 C)] 97.9 F (36.6 C) (06/21 1141) Pulse Rate:  [60-63] 63 (06/21 1141) Resp:  [18-20] 18 (06/21 1141) BP: (160-193)/(57-99) 193/99 (06/21 1141) SpO2:  [95 %-97 %] 95 % (06/21 1141) Weight:  [111.8 kg] 111.8 kg (06/21 0506) Last BM Date: 06/08/21  Intake/Output from previous day: 06/20 0701 - 06/21 0700 In: 840 [P.O.:840] Out: 3120 [Urine:3120] Intake/Output this shift: Total I/O In: 720 [P.O.:720] Out: 1000 [Urine:1000]  General appearance: alert, cooperative, and no distress and ill-appearing.  Family at bedside Extremities: Left lower extremity with a proximal medial thigh wound with some surrounding erythema but no cellulitic changes.  There is some surrounding tenderness with palpation but no fluid collection noted with palpation.  There is no necrosis noted.  There is no purulence.  Left lower extremity proximal shin wound with blistered epithelium still intact.  No blister is noted.  There is no surrounding erythema.  There is no foul odor.  There is no necrosis noted.  He does have some swelling of his distal extremity, distal extremity is warm to touch with palpable pedal pulse.  He does have chronic diabetic neuropathy of his lower extremities.  No changes in sensation.  Compartments are soft  Left forearm with new epithelialization noted and some sloughing epithelium.  He does have an area that appears deeper that is approximately 4 x 4 cm.  There is no surrounding erythema or cellulitic  changes.  This is tender to palpation.  There is no subcutaneous fluid collections noted with palpation.  He has a palpable radial pulse.  He has good range of motion of his wrist and complete range of motion of his 5 digits.  Normal cap refill.  Distal extremity is warm to touch.  He has a normal cascade of his digits with flexion and extension of his wrist.  Compartments are soft.  Pulses: 2+ and symmetric Incision/Wound: Left forearm   Left thigh:  Left medial thigh:    Lab Results:  CBC Latest Ref Rng & Units 06/07/2021 06/06/2021 06/05/2021  WBC 4.0 - 10.5 K/uL 10.1 10.3 10.8(H)  Hemoglobin 13.0 - 17.0 g/dL 11.9(L) 11.7(L) 11.6(L)  Hematocrit 39.0 - 52.0 % 35.9(L) 34.6(L) 33.9(L)  Platelets 150 - 400 K/uL 203 160 137(L)    BMET Recent Labs    06/09/21 0908 06/10/21 0256  NA 138 137  K 3.7 3.9  CL 108 108  CO2 21* 22  GLUCOSE 130* 102*  BUN 65* 59*  CREATININE 3.97* 3.51*  CALCIUM 9.9 10.2   PT/INR No results for input(s): LABPROT, INR in the last 72 hours. ABG No results for input(s): PHART, HCO3 in the last 72 hours.  Invalid input(s): PCO2, PO2  Studies/Results: No results found.  Anti-infectives: Anti-infectives (From admission, onward)    Start     Dose/Rate Route Frequency Ordered Stop   06/02/21 2200  ampicillin-sulbactam (UNASYN) 1.5 g in sodium chloride 0.9 % 100 mL IVPB  Status:  Discontinued  1.5 g 200 mL/hr over 30 Minutes Intravenous Every 12 hours 06/02/21 1242 06/05/21 1811   06/01/21 0000  ampicillin-sulbactam (UNASYN) 1.5 g in sodium chloride 0.9 % 100 mL IVPB  Status:  Discontinued        1.5 g 200 mL/hr over 30 Minutes Intravenous Every 6 hours 05/31/21 2311 06/02/21 1242       Assessment/Plan:  Recommend continuing with Xeroform dressing changes to left upper extremity and left lower extremity wound.  Recommend elevating left lower extremity and left upper extremity as often as possible.  There is no sign of infection.  No  surgical intervention planned at this time.  Pictures were taken and placed in the patient's chart with patient permission. We will continue to follow    LOS: 10 days    Charlies Constable, PA-C 06/10/2021

## 2021-06-10 NOTE — Progress Notes (Signed)
Progress Note    Ross [name]   YHC:[id number]  DOB: 1956-12-10  DOA: 05/31/2021     10  PCP: Orpah Melter, MD  CC: cardiac arrest  Hospital Course: Mr. Ross [name] is a 65 y.o. M with hx AF and 2nd deg AVB with PPM, sCHF EF 25%, stable angina, MO, COPD not on O2, CAD s/pCABG 2007, HTN, and DM who presented with witnessed out of hospital cardiac arrest.     Defibrillated x10, amiodarone 450 and over an hour CPR time.  After which he was able to follow commands, taken to the Cath lab and found to have diffuse disease.     6/11: Cardiac arrest, intubated, underwent left heart cath 6/12: Extubated 6/16: Cr peaked at 6.8 6/18: Nephrology signed off  Interval History:  No events overnight. We discussed his blood pressure some today.  He states that pressures checked in his right leg are often higher than when checked in his right upper extremity.  ROS: Constitutional: negative for chills and fevers, Respiratory: negative for cough, Cardiovascular: negative for chest pain, and Gastrointestinal: negative for abdominal pain  Assessment & Plan: Prolonged out of hospital VT/VF cardiac arrest Cardiac arrest versus initial presenting problem Urgent left heart cath showed 2 occluded grafts from his old CABG and medical therapy was recommended.  He had remarkable neurological recovery shortly after code, extubated next day.  - ICD is indicated, and EP recommend that his necrosis of the skin due to extravasation of pressors must resolve prior to ICD upgrade. 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  SLUMS here on 6/17 shows 23 out of 30, mostly in working memory and short-term memory tasks. - This appears to be slowly improving, he would benefit from skilled speech rehabilitation  - plan is for CIR at discharge      AKI ATN Cr 1.1-1.2 at baseline.  Here his creatinine was initially 2.6, trended up to 6.8, peaked on 6/17 and is now trending down.  Nephrology has signed off.  No need for dialysis. - Continue strict I's and O's - Avoid nephrotoxins -Trend Cr   Skin necrosis of the left arm and knee due to extravasation of Levophed Plastic surgery were consulted, they recommend Xeroform daily, clinical monitoring, no condition plan for excision and surgical debridement - Outpatient plastic surgery follow-up needed    Paroxysmal atrial fibrillation History of second-degree AV block with PPM Rate normal - Continue telemetry - Continue amiodarone, carvedilol, apixaban - Plan for amiodarone 400 BID for 2 weeks until Jun 29 then 200 daily   COPD No active disease - Resume home ICS/LAMA/LAMA at discharge   Diabetes Well-controlled - Continue sliding scale corrections   Hypertension Chronic systolic CHF/ischemic cardiomyopathy Coronary artery disease Appears euvolemic - Continue aspirin, atorvastatin, carvedilol, Bidil - Hold Entresto, Farxiga until renal function improves  - check BP only in RUE; leg pressures not accurate; have adjusted Bidil on 06/10/2021; follow-up further blood pressure results notably from using right upper extremity and dosage adjustment   Anemia of critical illness Mild, no clinical bleeding    Resolved issues: Post cardiac arrest cardiogenic shock Postarrest metabolic acidosis due to lactic acidosis Resolved  Acute respiratory failure with hypoxia due to cardiac arrest Resolved   Hyponatremia ruled out, mild transient low sodium, clinically insignificant   Possible aspiration pneumonia Completed 5 days Unasyn, leukocytosis and fever resolved.  Metabolic acidosis Resolved   Orthostasis Resolved    Old records reviewed in assessment of this patient  Antimicrobials:   DVT prophylaxis: SCD's Start: 05/31/21 2150 apixaban (ELIQUIS) tablet 5 mg   Code  Status:   Code Status: Full Code Family Communication:   Disposition Plan: Status is: Inpatient  Remains inpatient appropriate because:Unsafe d/c plan, Inpatient level of care appropriate due to severity of illness, and further renal recovery and acceptance to CIR  Dispo: The patient is from: Home              Anticipated d/c is to: CIR              Patient currently is not medically stable to d/c.   Difficult to place patient No      Risk of unplanned readmission score: Unplanned Admission- Pilot do not use: 13.51   Objective: Blood pressure (!) 193/99, pulse 63, temperature 97.9 F (36.6 C), temperature source Oral, resp. rate 18, height 6' 1.5" (1.867 m), weight 111.8 kg, SpO2 95 %.  Examination: General appearance: alert, cooperative, no distress, and some difficulty with concentration Head: Normocephalic, without obvious abnormality, atraumatic Eyes:  EOMI Lungs: clear to auscultation bilaterally Heart: regular rate and rhythm and S1, S2 normal Abdomen: normal findings: bowel sounds normal and soft, non-tender Extremities:  Left upper extremity edema noted.  Left upper extremity wrapped with Xeroform and gauze as well as left lower extremity with similar Skin:  LUE/LLE wrapped in dressings from skin necrosis Neurologic: Grossly normal but mildly slowed mentation appreciated   Consultants:  Nephro - signed off Cardiology  Procedures:    Data Reviewed: I have personally reviewed following labs and imaging studies Results for orders placed or performed during the hospital encounter of 05/31/21 (from the past 24 hour(s))  Glucose, capillary     Status: Abnormal   Collection Time: 06/09/21  4:15 PM  Result Value Ref Range   Glucose-Capillary 137 (H) 70 - 99 mg/dL  Glucose, capillary     Status: Abnormal   Collection Time: 06/09/21  9:21 PM  Result Value Ref Range   Glucose-Capillary 125 (H) 70 - 99 mg/dL   Comment 1 Notify RN    Comment 2 Document in Chart   APTT      Status: None   Collection Time: 06/10/21  2:56 AM  Result Value Ref Range   aPTT 32 24 - 36 seconds  Basic metabolic panel     Status: Abnormal   Collection Time: 06/10/21  2:56 AM  Result Value Ref Range   Sodium 137 135 - 145 mmol/L   Potassium 3.9 3.5 - 5.1 mmol/L   Chloride 108 98 - 111 mmol/L   CO2 22 22 - 32 mmol/L   Glucose, Bld 102 (H) 70 - 99 mg/dL   BUN 59 (H) 8 - 23 mg/dL   Creatinine, Ser 3.51 (H) 0.61 - 1.24 mg/dL   Calcium 10.2 8.9 - 10.3 mg/dL   GFR, Estimated 18 (L) >60 mL/min   Anion gap 7 5 - 15  Glucose, capillary     Status: None   Collection Time: 06/10/21  6:16 AM  Result Value Ref Range   Glucose-Capillary 91 70 - 99 mg/dL  Glucose, capillary     Status: Abnormal   Collection Time: 06/10/21 11:37 AM  Result Value Ref Range   Glucose-Capillary 109 (H) 70 - 99 mg/dL    Recent Results (from the past 240 hour(s))  C Difficile Quick Screen w PCR reflex  Status: None   Collection Time: 06/01/21 10:22 AM   Specimen: STOOL  Result Value Ref Range Status   C Diff antigen NEGATIVE NEGATIVE Final   C Diff toxin NEGATIVE NEGATIVE Final   C Diff interpretation No C. difficile detected.  Final    Comment: Performed at [facility], [place] [address] [place], [place] [address]  MRSA Next Gen by PCR, Nasal     Status: None   Collection Time: 06/02/21  8:30 AM  Result Value Ref Range Status   MRSA by PCR Next Gen NOT DETECTED NOT DETECTED Final    Comment: (NOTE) The GeneXpert MRSA Assay (FDA approved for NASAL specimens only), is one component of a comprehensive MRSA colonization surveillance program. It is not intended to diagnose MRSA infection nor to guide or monitor treatment for MRSA infections. Test performance is not FDA approved in patients less than 2 years old. Performed at [facility], [place] [address], [place], [place] [address]   Culture, Urine     Status: None   Collection Time: 06/02/21  2:31 PM   Specimen: Urine, Catheterized   Result Value Ref Range Status   Specimen Description URINE, CATHETERIZED  Final   Special Requests NONE  Final   Culture   Final    NO GROWTH Performed at [facility], [address], [place], [place] [address]    Report Status 06/03/2021 FINAL  Final  Culture, blood (routine x 2)     Status: None   Collection Time: 06/02/21  4:59 PM   Specimen: BLOOD RIGHT HAND  Result Value Ref Range Status   Specimen Description BLOOD RIGHT HAND  Final   Special Requests   Final    BOTTLES DRAWN AEROBIC ONLY Blood Culture results may not be optimal due to an inadequate volume of blood received in culture bottles   Culture   Final    NO GROWTH 5 DAYS Performed at [facility], [place] [address], [place], [place] [address]    Report Status 06/07/2021 FINAL  Final  Culture, blood (routine x 2)     Status: None   Collection Time: 06/02/21  5:08 PM   Specimen: BLOOD RIGHT HAND  Result Value Ref Range Status   Specimen Description BLOOD RIGHT HAND  Final   Special Requests   Final    BOTTLES DRAWN AEROBIC ONLY Blood Culture results may not be optimal due to an inadequate volume of blood received in culture bottles   Culture   Final    NO GROWTH 5 DAYS Performed at [facility], [place] [address] [place], [place] [address]    Report Status 06/07/2021 FINAL  Final  SARS CORONAVIRUS 2 (TAT 6-24 HRS) Nasopharyngeal Nasopharyngeal Swab     Status: None   Collection Time: 06/04/21  4:02 PM   Specimen: Nasopharyngeal Swab  Result Value Ref Range Status   SARS Coronavirus 2 NEGATIVE NEGATIVE Final    Comment: (NOTE) SARS-CoV-2 target nucleic acids are NOT DETECTED.  The SARS-CoV-2 RNA is generally detectable in upper and lower respiratory specimens during the acute phase of infection. Negative results do not preclude SARS-CoV-2 infection, do not rule out co-infections with other pathogens, and should not be used as the sole basis for treatment or other patient management  decisions. Negative results must be combined with clinical observations, patient history, and epidemiological information. The expected result is Negative.  Fact Sheet for Patients: SugarRoll.be  Fact Sheet for Healthcare Providers:  https://www.woods-mathews.com/  This test is not yet approved or cleared by the [place] and  has been authorized for detection and/or diagnosis of SARS-CoV-2 by FDA under an Emergency Use Authorization (EUA). This EUA will remain  in effect (meaning this test can be used) for the duration of the COVID-19 declaration under Se ction 564(b)(1) of the Act, 21 U.S.C. section 360bbb-3(b)(1), unless the authorization is terminated or revoked sooner.  Performed at [facility], [place] [address], [place], [place] [address]      Radiology Studies: No results found. CT HEAD WO CONTRAST  Final Result    DG CHEST PORT 1 VIEW  Final Result    [place] EKG SITE RITE  Final Result      Scheduled Meds:  amiodarone  400 mg Oral BID   apixaban  5 mg Oral BID   aspirin  81 mg Oral Daily   atorvastatin  80 mg Oral Daily   carvedilol  6.25 mg Oral BID WC   insulin aspart  0-9 Units Subcutaneous TID WC   isosorbide-hydrALAZINE  1.5 tablet Oral TID   mouth rinse  15 mL Mouth Rinse BID   polyethylene glycol  17 g Oral Daily   senna-docusate  1 tablet Oral BID   sodium chloride flush  10-40 mL Intracatheter Q12H   sodium chloride flush  3 mL Intravenous Q12H   PRN Meds: sodium chloride, acetaminophen, ALPRAZolam, guaiFENesin, ipratropium-albuterol, ondansetron (ZOFRAN) IV, oxyCODONE-acetaminophen, simethicone, sodium chloride flush, sodium chloride flush Continuous Infusions:  sodium chloride     sodium chloride       LOS: 10 days  Time spent: Greater than 50% of the 35 minute visit was spent in counseling/coordination of care for the patient as laid out in the A&P.   Dwyane Dee, MD [facility] 06/10/2021, 2:02 PM

## 2021-06-10 NOTE — Progress Notes (Signed)
Progress Note  Patient Name: Ross [name] Date of Encounter: 06/10/2021  Primary Cardiologist: Dr. Daneen Schick  Subjective   No recurrent chest pain  Inpatient Medications    Scheduled Meds:  amiodarone  400 mg Oral BID   apixaban  5 mg Oral BID   aspirin  81 mg Oral Daily   atorvastatin  80 mg Oral Daily   carvedilol  6.25 mg Oral BID WC   insulin aspart  0-9 Units Subcutaneous TID WC   isosorbide-hydrALAZINE  1 tablet Oral TID   mouth rinse  15 mL Mouth Rinse BID   polyethylene glycol  17 g Oral Daily   senna-docusate  1 tablet Oral BID   sodium chloride flush  10-40 mL Intracatheter Q12H   sodium chloride flush  3 mL Intravenous Q12H   Continuous Infusions:  sodium chloride     sodium chloride     PRN Meds: sodium chloride, acetaminophen, ALPRAZolam, guaiFENesin, ipratropium-albuterol, ondansetron (ZOFRAN) IV, oxyCODONE-acetaminophen, simethicone, sodium chloride flush, sodium chloride flush   Vital Signs    Vitals:   06/09/21 0834 06/09/21 1205 06/09/21 1938 06/10/21 0506  BP: (!) 159/67 (!) 148/79 (!) 160/57 (!) 172/67  Pulse: 61 61 62 60  Resp:  18 18 20   Temp:  98.8 F (37.1 C) 98.5 F (36.9 C) 98.2 F (36.8 C)  TempSrc:  Oral Oral Oral  SpO2:  97% 95% 97%  Weight:    111.8 kg  Height:        Intake/Output Summary (Last 24 hours) at 06/10/2021 0950 Last data filed at 06/10/2021 0856 Gross per 24 hour  Intake 840 ml  Output 2750 ml  Net -1910 ml    I/O since admission:   Filed Weights   06/08/21 0434 06/09/21 0004 06/10/21 0506  Weight: 113.7 kg 113 kg 111.8 kg    Telemetry    V paced at 60- Personally Reviewed  ECG    ECG (independently read by me):  Physical Exam   BP (!) 172/67 (BP Location: Right Leg)   Pulse 60   Temp 98.2 F (36.8 C) (Oral)   Resp 20   Ht 6' 1.5" (1.867 m)   Wt 111.8 kg Comment: scale c  SpO2 97%   BMI 32.08 kg/m  General: Alert, oriented, no distress.  Skin: normal turgor, no rashes, warm and  dry HEENT: Normocephalic, atraumatic. Pupils equal round and reactive to light; sclera anicteric; extraocular muscles intact;  Nose without nasal septal hypertrophy Mouth/Parynx benign; Mallinpatti scale 3 Neck: No JVD, no carotid bruits; normal carotid upstroke Lungs: clear to ausculatation and percussion; no wheezing or rales Chest wall: without tenderness to palpitation Heart: PMI not displaced, RRR, s1 s2 normal, 1/6 systolic murmur, no diastolic murmur, no rubs, gallops, thrills, or heaves Abdomen: soft, nontender; no hepatosplenomehaly, BS+; abdominal aorta nontender and not dilated by palpation. Back: no CVA tenderness Pulses 2+ Musculoskeletal: full range of motion, normal strength, no joint deformities Extremities: Left upper and lower extremity wrapped at site of IV infiltration of norepinephrine.  Homan's sign negative  Neurologic: grossly nonfocal; Cranial nerves grossly wnl Psychologic: Normal mood and affect   Labs    Chemistry Recent Labs  Lab 06/08/21 0816 06/09/21 0908 06/10/21 0256  NA 136 138 137  K 3.9 3.7 3.9  CL 107 108 108  CO2 19* 21* 22  GLUCOSE 94 130* 102*  BUN 73* 65* 59*  CREATININE 4.77* 3.97* 3.51*  CALCIUM 9.9 9.9 10.2  PROT 5.4*  --   --  ALBUMIN 2.8*  --   --   AST 37  --   --   ALT 68*  --   --   ALKPHOS 73  --   --   BILITOT 1.1  --   --   GFRNONAA 13* 16* 18*  ANIONGAP 10 9 7      Hematology Recent Labs  Lab 06/05/21 0500 06/06/21 0156 06/07/21 0209  WBC 10.8* 10.3 10.1  RBC 3.70* 3.79* 3.87*  HGB 11.6* 11.7* 11.9*  HCT 33.9* 34.6* 35.9*  MCV 91.6 91.3 92.8  MCH 31.4 30.9 30.7  MCHC 34.2 33.8 33.1  RDW 13.3 13.3 13.3  PLT 137* 160 203    Cardiac EnzymesNo results for input(s): TROPONINI in the last 168 hours. No results for input(s): TROPIPOC in the last 168 hours.   BNPNo results for input(s): BNP, PROBNP in the last 168 hours.   DDimer No results for input(s): DDIMER in the last 168 hours.   Lipid Panel      Component Value Date/Time   CHOL 96 (L) 10/24/2015 1340   TRIG 120 10/24/2015 1340   HDL 39 (L) 10/24/2015 1340   CHOLHDL 2.5 10/24/2015 1340   VLDL 24 10/24/2015 1340   LDLCALC 33 10/24/2015 1340     Radiology    No results found.  Cardiac Studies     2D echocardiogram (06/01/2021)   1. Left ventricular ejection fraction, by estimation, is 35 to 40%. The  left ventricle has moderately decreased function. The left ventricle has  no regional wall motion abnormalities. There is severe concentric left  ventricular hypertrophy. Left  ventricular diastolic parameters are indeterminate. Elevated left  ventricular end-diastolic pressure. There is akinesis of the left  ventricular, mid anteroseptal wall. There is akinesis of the left  ventricular, apical septal wall, inferior wall and  anterior wall. There is akinesis of the left ventricular, apical segment.   2. Right ventricular systolic function was not well visualized. The right  ventricular size is mildly enlarged. Tricuspid regurgitation signal is  inadequate for assessing PA pressure.   3. Right atrial size was severely dilated.   4. The mitral valve is normal in structure. Trivial mitral valve  regurgitation. No evidence of mitral stenosis.   5. The aortic valve is tricuspid. Aortic valve regurgitation is not  visualized. Mild aortic valve sclerosis is present, with no evidence of  aortic valve stenosis.   6. Aortic dilatation noted. There is mild dilatation of the aortic root,  measuring 41 mm.   7. The inferior vena cava is dilated in size with >50% respiratory  variability, suggesting right atrial pressure of 8 mmHg.    Cardiac catheterization (05/31/2021)   Prox RCA to Mid RCA lesion is 99% stenosed. Dist RCA lesion is 80% stenosed. Ramus lesion is 100% stenosed. Origin to Prox Graft lesion is 100% stenosed. Origin lesion is 100% stenosed. Prox LAD lesion is 70% stenosed. Prox LAD to Mid LAD lesion is 70%  stenosed. Mid LAD-1 lesion is 90% stenosed. Mid LAD-2 lesion is 80% stenosed.   VF cardiac arrest with prolonged resuscitative effort and ultimate restoration of paced rhythm following multiple shocks, amiodarone, and Levophed infusion.   Severe native CAD with diffuse severe proximal LAD stenoses of 70 to 80% with focal 90% stenosis between the first and second septal perforating artery with total occlusion of the first diagonal vessel and a flush and fill phenomena of the mid LAD secondary to competitive filling via the LIMA graft.   Old occlusion  of the ramus immediate vessel and obtuse marginal vessel   Dominant RCA with previously noted  diffuse 90% mid stenosis which now has right to right collateralization and diffuse 80% distal stenosis.   Patent LIMA graft supplying the mid LAD.   Patent SVG supplying the first diagonal vessel.   Old occlusion of the vein graft to a ramus intermediate vessel.   Old occlusion of the vein graft which had supplied the distal RCA.   LVEDP 20 mmHg   RECOMMENDATION: Patient is admitted to the critical care service.  We will plan 2D echo Doppler study in a.m.  He has been seen by Dr. Rayann Heman in the emergency room during his prolonged resuscitative efforts today.  Guideline directed medical therapy for CHF.  Anticipate ICD implantation per electrophysiology.   Coronary Diagrams     Diagnostic Dominance: Right         Patient Profile     64 y.o. male who has with known ischemic CM (EF 25%), s/p prior CABG, permanent afib, and DM who was admitted with VF arrest underwent prolonged resuscitative efforts with ultimate ROSC.  Assessment & Plan    Status post VF cardiac arrest on May 31, 2021 with prolonged resuscitative effort, defibrillations x10 ROSC catheterization showed patent LIMA to LAD and patent vein graft to the diagonal vessel.  There was previously noted occlusion of the graft supplying the ramus intermediate vessel and graft  supplying the PDA.  His RCA had subtotal mid stenosis with bridging collaterals distally.  He has not had any further chest pain.  He is now on oral amiodarone at 400 mg twice a day for several weeks prior to his dose reduction. Ischemic cardiomyopathy: EF 35 to 40% by recent echo.  He is now on carvedilol 6.25 mg twice a day.  V paced at 60 on telemetry. With renal dysfunction unable to add ACE inhibition or ARB therapy or spironolactone.  However will initiate nitrate/hydralazine. And titrate if needed.   Will be sent home initial LifeVest with ultimate plans for CRT upgrade Permanent atrial fibrillation.  He is on Eliquis 5 mg twice daily.  Ventricular pacing AKI: Baseline creatinine 1.2 increased to 6.7 most likely contributed by acute tubular necrosis from his acute event with prolonged resuscitative [place].  Creatinine slowly improving today at 3.51. Left upper and lower extremity skin necrosis: Most likely contributed by IV infiltration of norepinephrine. Daily wrapping per plastic surgery Patient was seen by plastic surgery with plans for outpatient follow-up and possible surgery.  Signed, Troy Sine, MD, [facility] 06/10/2021, 9:50 AM

## 2021-06-10 NOTE — Plan of Care (Signed)
  Problem: Education: Goal: Knowledge of General Education information will improve Description: Including pain rating scale, medication(s)/side effects and non-pharmacologic comfort measures Outcome: Progressing   Problem: Health Behavior/Discharge Planning: Goal: Ability to manage health-related needs will improve Outcome: Progressing   Problem: Clinical Measurements: Goal: Ability to maintain clinical measurements within normal limits will improve Outcome: Progressing Goal: Will remain free from infection Outcome: Progressing Goal: Diagnostic test results will improve Outcome: Progressing Goal: Respiratory complications will improve Outcome: Progressing Goal: Cardiovascular complication will be avoided Outcome: Progressing   Problem: Activity: Goal: Risk for activity intolerance will decrease Outcome: Progressing   Problem: Nutrition: Goal: Adequate nutrition will be maintained Outcome: Progressing   Problem: Coping: Goal: Level of anxiety will decrease Outcome: Progressing   Problem: Elimination: Goal: Will not experience complications related to bowel motility Outcome: Progressing Goal: Will not experience complications related to urinary retention Outcome: Progressing   Problem: Skin Integrity: Goal: Risk for impaired skin integrity will decrease Outcome: Progressing   Problem: Education: Goal: Ability to demonstrate management of disease process will improve Outcome: Progressing Goal: Ability to verbalize understanding of medication therapies will improve Outcome: Progressing Goal: Individualized Educational Video(s) Outcome: Progressing   Problem: Cardiac: Goal: Ability to achieve and maintain adequate cardiopulmonary perfusion will improve Outcome: Progressing

## 2021-06-11 DIAGNOSIS — N179 Acute kidney failure, unspecified: Secondary | ICD-10-CM

## 2021-06-11 LAB — GLUCOSE, CAPILLARY
Glucose-Capillary: 93 mg/dL (ref 70–99)
Glucose-Capillary: 126 mg/dL — ABNORMAL HIGH (ref 70–99)
Glucose-Capillary: 107 mg/dL — ABNORMAL HIGH (ref 70–99)
Glucose-Capillary: 125 mg/dL — ABNORMAL HIGH (ref 70–99)

## 2021-06-11 LAB — BASIC METABOLIC PANEL
Anion gap: 8 (ref 5–15)
BUN: 53 mg/dL — ABNORMAL HIGH (ref 8–23)
CO2: 22 mmol/L (ref 22–32)
Calcium: 10.2 mg/dL (ref 8.9–10.3)
Chloride: 108 mmol/L (ref 98–111)
Creatinine, Ser: 3.09 mg/dL — ABNORMAL HIGH (ref 0.61–1.24)
GFR, Estimated: 21 mL/min — ABNORMAL LOW (ref >60)
Glucose, Bld: 100 mg/dL — ABNORMAL HIGH (ref 70–99)
Potassium: 4.2 mmol/L (ref 3.5–5.1)
Sodium: 138 mmol/L (ref 135–145)

## 2021-06-11 LAB — APTT: aPTT: 30 seconds (ref 24–36)

## 2021-06-11 MED ORDER — SALINE SPRAY 0.65 % NA SOLN
1.0000 | NASAL | Status: DC | PRN
  Filled 2021-06-11: qty 44

## 2021-06-11 MED ORDER — CAMPHOR-MENTHOL 0.5-0.5 % EX LOTN
TOPICAL_LOTION | CUTANEOUS | Status: DC | PRN
  Filled 2021-06-11: qty 222

## 2021-06-11 NOTE — Progress Notes (Signed)
  Speech Language Pathology Treatment: Cognitive-Linquistic  Patient Details Name: Ross [name] MRN: [id number] DOB: 1956/12/04 Today's Date: 06/11/2021 Time: 0626-9485 SLP Time Calculation (min) (ACUTE ONLY): 19 min  Assessment / Plan / Recommendation Clinical Impression  Pt was seen for ongoing cognitive tx with emphasis on identifying memory strategies and ways to implement them into functional tasks. Pt initially did not identify any memory strategies that he may have used at home PTA, but with Min cues shared that he did use lists and calendars, also keeping them in the same location (back L pocket) so that he could always locate them. Education was provided about ways to implement this on a broader level after discharge. Education was also provided about repetition as a memory strategy, and pt was able to recall 4/4 words in a delayed recall task without cueing needed. Will continue to follow and address higher level cognition.   HPI HPI: Mr. Alexopoulos is a 65 y.o. M with hx AF and 2nd deg AVB with PPM, sCHF EF 25%, stable angina, MO, COPD not on O2, CAD s/pCABG 2007, HTN, and DM who presented with witnessed out of hospital cardiac arrest on 6/11.  Over an hour CPR time, pt intubated taken to the Cath lab and found to have diffuse disease. Extubated 6/12. Noted to have cognitive impairment.      SLP Plan  Continue with current plan of care       Recommendations                   Follow up Recommendations: Inpatient Rehab SLP Visit Diagnosis: Cognitive communication deficit (I62.703) Plan: Continue with current plan of care       GO                Osie Bond., M.A. [facility] Pager [phone] Office [phone]  06/11/2021, 4:47 PM

## 2021-06-11 NOTE — Progress Notes (Signed)
Physical Therapy Treatment Patient Details Name: Ross [name] MRN: [id number] DOB: 11/07/1956 Today's Date: 06/11/2021    History of Present Illness 65 yo admitted 6/11 after Vfib cardiac arrest at home with greater than an hour resuscitation and 10+ shocks, cath demonstrating severe CAD. Extubated 6/12. L UE/LE skin necrosis- wounds dressed by plastic surgery. PMhx: cardiomyopathy, CABG, CHF, DM, HTN, PPM, Afib, obesity, neuropathy.    PT Comments    Pt with improved tolerance for ambulation and transfers this session. Pt with increased difficulty standing from lower surfaces, requiring [place] of grab bars, armrests, or physical assistance to successfully transfer. Pt will benefit from continued acute PT POC to improve activity tolerance and balance while reducing falls risk. PT continues to recommend CIR placement at this time.   Follow Up Recommendations  CIR;Supervision/Assistance - 24 hour     Equipment Recommendations  Rolling walker with 5" wheels    Recommendations for Other Services       Precautions / Restrictions Precautions Precautions: Fall Precaution Comments: monitor BP/orthostatic symptoms, life vest Restrictions Weight Bearing Restrictions: No    Mobility  Bed Mobility Overal bed mobility: Needs Assistance Bed Mobility: Rolling;Sidelying to Sit Rolling: Min guard Sidelying to sit: Min assist (hand hold to pull into sitting, pt unable to elevate with use of bed rail)            Transfers Overall transfer level: Needs assistance Equipment used: Rolling walker (2 wheeled) Transfers: Sit to/from Stand Sit to Stand: Min assist;Min guard         General transfer comment: pt attempts sit to stand from bed with PT hand hold but is unsuccessful. With slightly elevated bed pt performs sit to stand with minA, progressing through session pt stands from arm chair with minG and from commode with minG utilizing grab bar  Ambulation/Gait Ambulation/Gait assistance:  Min guard Gait Distance (Feet): 25 Feet (additional trials of 8 and 15') Assistive device: Rolling walker (2 wheeled) Gait Pattern/deviations: Step-through pattern Gait velocity: reduced Gait velocity interpretation: <1.8 ft/sec, indicate of risk for recurrent falls General Gait Details: pt with slowed step-through gait, reduced stride length. Assistance for safety   Stairs             Wheelchair Mobility    Modified Rankin (Stroke Patients Only)       Balance Overall balance assessment: Needs assistance Sitting-balance support: No upper extremity supported;Feet supported Sitting balance-Leahy Scale: Fair     Standing balance support: Bilateral upper extremity supported Standing balance-Leahy Scale: Poor Standing balance comment: reliant on AD or external support.                            Cognition Arousal/Alertness: Awake/alert Behavior During Therapy: Flat affect Overall Cognitive Status: Impaired/Different from baseline Area of Impairment: Problem solving;Attention                   Current Attention Level: Sustained         Problem Solving: Slow processing        Exercises      General Comments General comments (skin integrity, edema, etc.): VSS on RA, life vest on      Pertinent Vitals/Pain Pain Assessment: Faces Faces Pain Scale: Hurts whole lot Pain Location: L knee with stand to sit Pain Descriptors / Indicators: Grimacing Pain Intervention(s): Monitored during session    Home Living  Prior Function            PT Goals (current goals can now be found in the care plan section) Acute Rehab PT Goals Patient Stated Goal: to return to independence Progress towards PT goals: Progressing toward goals    Frequency    Min 3X/week      PT Plan Current plan remains appropriate    Co-evaluation              AM-PAC PT "6 Clicks" Mobility   Outcome Measure  Help needed turning  from your back to your side while in a flat bed without using bedrails?: A Little Help needed moving from lying on your back to sitting on the side of a flat bed without using bedrails?: A Little Help needed moving to and from a bed to a chair (including a wheelchair)?: A Little Help needed standing up from a chair using your arms (e.g., wheelchair or bedside chair)?: A Little Help needed to walk in hospital room?: A Little Help needed climbing 3-5 steps with a railing? : A Lot 6 Click Score: 17    End of Session Equipment Utilized During Treatment: Gait belt Activity Tolerance: Patient tolerated treatment well Patient left: in chair;with call bell/phone within reach;with chair alarm set Nurse Communication: Mobility status PT Visit Diagnosis: Unsteadiness on feet (R26.81);Other abnormalities of gait and mobility (R26.89);Difficulty in walking, not elsewhere classified (R26.2);Pain Pain - Right/Left: Left Pain - part of body: Leg;Knee     Time: 1020-1056 PT Time Calculation (min) (ACUTE ONLY): 36 min  Charges:  $Gait Training: 8-22 mins $Therapeutic Activity: 8-22 mins                     Zenaida Niece, PT, DPT Acute Rehabilitation Pager: [phone]    Zenaida Niece 06/11/2021, 11:08 AM

## 2021-06-11 NOTE — Progress Notes (Signed)
Occupational Therapy Treatment Patient Details Name: Ross [name] MRN: [id number] DOB: Feb 28, 1956 Today's Date: 06/11/2021    History of present illness 65 yo admitted 6/11 after Vfib cardiac arrest at home with greater than an hour resuscitation and 10+ shocks, cath demonstrating severe CAD. Extubated 6/12. L UE/LE skin necrosis- wounds dressed by plastic surgery. PMhx: cardiomyopathy, CABG, CHF, DM, HTN, PPM, Afib, obesity, neuropathy.   OT comments  Pt progressing with OT goals. This session, pt required min A to power to stand from elevated surfaces and min guard for all functional mobility. Pt takes smaller steps and presents with decreased activity tolerance, fatiguing quickly once standing. Pt required a rest break then was able to stand again and complete grooming at the sink. Acute OT will continue to follow up and assist pt with safe functional mobility and ADL performance.    Follow Up Recommendations  CIR;Supervision/Assistance - 24 hour    Equipment Recommendations  Other (comment) (TBD)    Recommendations for Other Services      Precautions / Restrictions Precautions Precautions: Fall Precaution Comments: monitor BP/orthostatic symptoms, life vest Restrictions Weight Bearing Restrictions: No       Mobility Bed Mobility               General bed mobility comments: Pt recieved sitting EOB    Transfers Overall transfer level: Needs assistance Equipment used: Rolling walker (2 wheeled) Transfers: Sit to/from Stand Sit to Stand: Min assist;From elevated surface         General transfer comment: Pt requires min A to stand and steady prior to mobilizing    Balance Overall balance assessment: Needs assistance Sitting-balance support: No upper extremity supported;Feet supported Sitting balance-Leahy Scale: Fair Sitting balance - Comments: Leaning forward on table   Standing balance support: Bilateral upper extremity supported Standing balance-Leahy  Scale: Poor Standing balance comment: reliant on AD or external support.                           ADL either performed or assessed with clinical judgement   ADL Overall ADL's : Needs assistance/impaired     Grooming: Supervision/safety;Standing                   Toilet Transfer: Minimal assistance;Ambulation Toilet Transfer Details (indicate cue type and reason): simulated via functional mobility                 Vision   Vision Assessment?: No apparent visual deficits   Perception     Praxis      Cognition Arousal/Alertness: Awake/alert Behavior During Therapy: Flat affect Overall Cognitive Status: Impaired/Different from baseline Area of Impairment: Problem solving;Attention                   Current Attention Level: Sustained         Problem Solving: Slow processing          Exercises     Shoulder Instructions       General Comments Life vest on, VSS on RA    Pertinent Vitals/ Pain       Pain Assessment: 0-10 Pain Score: 8  Faces Pain Scale: Hurts a little bit Pain Location: back and bottom Pain Descriptors / Indicators: Aching;Constant;Discomfort;Grimacing Pain Intervention(s): Limited activity within patient's tolerance;Monitored during session;Patient requesting pain meds-RN notified;Repositioned  Home Living  Prior Functioning/Environment              Frequency  Min 2X/week        Progress Toward Goals  OT Goals(current goals can now be found in the care plan section)  Progress towards OT goals: Progressing toward goals  Acute Rehab OT Goals Patient Stated Goal: to return to independence OT Goal Formulation: With patient Time For Goal Achievement: 06/18/21 Potential to Achieve Goals: Good ADL Goals Pt Will Perform Grooming: with supervision;with set-up;sitting;standing Pt Will Perform Lower Body Dressing: sit to/from stand;with min  assist;with adaptive equipment Pt Will Transfer to Toilet: with supervision;ambulating;regular height toilet;bedside commode Pt Will Perform Toileting - Clothing Manipulation and hygiene: with supervision;sit to/from stand Pt/caregiver will Perform Home Exercise Program: Increased strength;Both right and left upper extremity;With written HEP provided Additional ADL Goal #1: Pt will demonstrate ability to follow 2-3 step commands with increased time to optimize participation in ADLs.  Plan Discharge plan remains appropriate;Frequency remains appropriate    Co-evaluation                 AM-PAC OT "6 Clicks" Daily Activity     Outcome Measure   Help from another person eating meals?: None Help from another person taking care of personal grooming?: A Little Help from another person toileting, which includes using toliet, bedpan, or urinal?: A Little Help from another person bathing (including washing, rinsing, drying)?: A Lot Help from another person to put on and taking off regular upper body clothing?: A Little Help from another person to put on and taking off regular lower body clothing?: A Lot 6 Click Score: 17    End of Session Equipment Utilized During Treatment: Gait belt;Rolling walker  OT Visit Diagnosis: Other abnormalities of gait and mobility (R26.89);Muscle weakness (generalized) (M62.81);Other symptoms and signs involving cognitive function;Pain Pain - Right/Left: Left Pain - part of body: Leg;Arm   Activity Tolerance Patient tolerated treatment well   Patient Left in chair;with call bell/phone within reach;with chair alarm set   Nurse Communication Mobility status;Patient requests pain meds (waffle cushion)        Time: 8016-5537 OT Time Calculation (min): 17 min  Charges: OT General Charges $OT Visit: 1 Visit OT Treatments $Self Care/Home Management : 8-22 mins  Ross [name] H., OTR/L Acute Rehabilitation  Ross [name] Ross [name] 06/11/2021, 5:37 PM

## 2021-06-11 NOTE — Progress Notes (Addendum)
Progress Note  Patient Name: Ross [name] Date of Encounter: 06/11/2021  [facility] Cardiologist: Sinclair Grooms, MD   Subjective   C/o back pain. Awaiting CIR placement  Inpatient Medications    Scheduled Meds:  amiodarone  400 mg Oral BID   apixaban  5 mg Oral BID   aspirin  81 mg Oral Daily   atorvastatin  80 mg Oral Daily   carvedilol  6.25 mg Oral BID WC   insulin aspart  0-9 Units Subcutaneous TID WC   isosorbide-hydrALAZINE  1.5 tablet Oral TID   mouth rinse  15 mL Mouth Rinse BID   polyethylene glycol  17 g Oral Daily   senna-docusate  1 tablet Oral BID   sodium chloride flush  10-40 mL Intracatheter Q12H   sodium chloride flush  3 mL Intravenous Q12H   Continuous Infusions:  sodium chloride     sodium chloride     PRN Meds: sodium chloride, acetaminophen, ALPRAZolam, camphor-menthol, guaiFENesin, ipratropium-albuterol, melatonin, ondansetron (ZOFRAN) IV, oxyCODONE-acetaminophen, simethicone, sodium chloride, sodium chloride flush, sodium chloride flush   Vital Signs    Vitals:   06/10/21 0506 06/10/21 1141 06/10/21 2001 06/11/21 0425  BP: (!) 172/67 (!) 193/99 139/62 (!) 130/54  Pulse: 60 63 62 (!) 59  Resp: 20 18 18 18   Temp: 98.2 F (36.8 C) 97.9 F (36.6 C) 98.4 F (36.9 C) 98.1 F (36.7 C)  TempSrc: Oral Oral Oral Oral  SpO2: 97% 95% 97% 97%  Weight: 111.8 kg   109.3 kg  Height:        Intake/Output Summary (Last 24 hours) at 06/11/2021 0949 Last data filed at 06/11/2021 0817 Gross per 24 hour  Intake 360 ml  Output 1700 ml  Net -1340 ml   Last 3 Weights 06/11/2021 06/10/2021 06/09/2021  Weight (lbs) 240 lb 14.4 oz 246 lb 8 oz 249 lb 1.6 oz  Weight (kg) 109.272 kg 111.812 kg 112.991 kg      Telemetry    Vpaced - Personally Reviewed  ECG    No new tracing  Physical Exam   GEN: No acute distress.   Neck: No JVD Cardiac: RRR, no murmurs, rubs, or gallops.  Respiratory: Clear to auscultation bilaterally. Wearing  lifevest GI: Soft, nontender, non-distended  MS: No edema; No deformity. Neuro:  Nonfocal  Psych: Normal affect   Labs    High Sensitivity Troponin:  No results for input(s): TROPONINIHS in the last 720 hours.    Chemistry Recent Labs  Lab 06/08/21 0816 06/09/21 0908 06/10/21 0256 06/11/21 0238  NA 136 138 137 138  K 3.9 3.7 3.9 4.2  CL 107 108 108 108  CO2 19* 21* 22 22  GLUCOSE 94 130* 102* 100*  BUN 73* 65* 59* 53*  CREATININE 4.77* 3.97* 3.51* 3.09*  CALCIUM 9.9 9.9 10.2 10.2  PROT 5.4*  --   --   --   ALBUMIN 2.8*  --   --   --   AST 37  --   --   --   ALT 68*  --   --   --   ALKPHOS 73  --   --   --   BILITOT 1.1  --   --   --   GFRNONAA 13* 16* 18* 21*  ANIONGAP 10 9 7 8      Hematology Recent Labs  Lab 06/05/21 0500 06/06/21 0156 06/07/21 0209  WBC 10.8* 10.3 10.1  RBC 3.70* 3.79* 3.87*  HGB 11.6* 11.7*  11.9*  HCT 33.9* 34.6* 35.9*  MCV 91.6 91.3 92.8  MCH 31.4 30.9 30.7  MCHC 34.2 33.8 33.1  RDW 13.3 13.3 13.3  PLT 137* 160 203    BNPNo results for input(s): BNP, PROBNP in the last 168 hours.   DDimer No results for input(s): DDIMER in the last 168 hours.   Radiology    No results found.  Cardiac Studies   2D echocardiogram (06/01/2021)   1. Left ventricular ejection fraction, by estimation, is 35 to 40%. The  left ventricle has moderately decreased function. The left ventricle has  no regional wall motion abnormalities. There is severe concentric left  ventricular hypertrophy. Left  ventricular diastolic parameters are indeterminate. Elevated left  ventricular end-diastolic pressure. There is akinesis of the left  ventricular, mid anteroseptal wall. There is akinesis of the left  ventricular, apical septal wall, inferior wall and  anterior wall. There is akinesis of the left ventricular, apical segment.   2. Right ventricular systolic function was not well visualized. The right  ventricular size is mildly enlarged. Tricuspid  regurgitation signal is  inadequate for assessing PA pressure.   3. Right atrial size was severely dilated.   4. The mitral valve is normal in structure. Trivial mitral valve  regurgitation. No evidence of mitral stenosis.   5. The aortic valve is tricuspid. Aortic valve regurgitation is not  visualized. Mild aortic valve sclerosis is present, with no evidence of  aortic valve stenosis.   6. Aortic dilatation noted. There is mild dilatation of the aortic root,  measuring 41 mm.   7. The inferior vena cava is dilated in size with >50% respiratory  variability, suggesting right atrial pressure of 8 mmHg.     Cardiac catheterization (05/31/2021)   Prox RCA to Mid RCA lesion is 99% stenosed. Dist RCA lesion is 80% stenosed. Ramus lesion is 100% stenosed. Origin to Prox Graft lesion is 100% stenosed. Origin lesion is 100% stenosed. Prox LAD lesion is 70% stenosed. Prox LAD to Mid LAD lesion is 70% stenosed. Mid LAD-1 lesion is 90% stenosed. Mid LAD-2 lesion is 80% stenosed.   VF cardiac arrest with prolonged resuscitative effort and ultimate restoration of paced rhythm following multiple shocks, amiodarone, and Levophed infusion.   Severe native CAD with diffuse severe proximal LAD stenoses of 70 to 80% with focal 90% stenosis between the first and second septal perforating artery with total occlusion of the first diagonal vessel and a flush and fill phenomena of the mid LAD secondary to competitive filling via the LIMA graft.   Old occlusion of the ramus immediate vessel and obtuse marginal vessel   Dominant RCA with previously noted  diffuse 90% mid stenosis which now has right to right collateralization and diffuse 80% distal stenosis.   Patent LIMA graft supplying the mid LAD.   Patent SVG supplying the first diagonal vessel.   Old occlusion of the vein graft to a ramus intermediate vessel.   Old occlusion of the vein graft which had supplied the distal RCA.   LVEDP 20 mmHg    RECOMMENDATION: Patient is admitted to the critical care service.  We will plan 2D echo Doppler study in a.m.  He has been seen by Dr. Rayann Heman in the emergency room during his prolonged resuscitative efforts today.  Guideline directed medical therapy for CHF.  Anticipate ICD implantation per electrophysiology.   Coronary Diagrams     Diagnostic Dominance: Right       Patient Profile  65 y.o. male who has with known ischemic CM (EF 25%), s/p prior CABG, permanent afib, and DM who was admitted with VF arrest underwent prolonged resuscitative efforts with ultimate ROSC.  Assessment & Plan    Status post VF arrest: patient had witnessed cardiac arrest, defibrillation x10 and CPR with ROSC.  He was intubated and transported to [facility].  Cardiac catheterization performed by Dr. Claiborne Billings on 05/31/2021 at the time of presentation showed a patent vein to ramus branch, patent LIMA to the LAD, occluded vein to the RCA with occluded native RCA and occluded vein to a diagonal branch.  Medical therapy was recommended for this. There were no "culprit lesions".   -- treated with IV amiodarone which has been converted to p.o. load, on 400mg  BID amiodarone for total of 2 weeks, then down to 200mg  daily. Remains hemodynamically stable  Ischemic cardiomyopathy: EF in the 30% range in the past and currently 35 to 40% by 2D echo.  He was on guideline directed optimal medical therapy prior to admission followed by Dr. Tamala Julian.  -- Coreg has been resumed and titrated to 6.25mg  BID. Unable to add ACE/ARB/spiro with renal dysfunction. Cr is improving, suspect may be able to add at later time.   Atrial fibrillation: history of permanent A. fib on Eliquis oral anticoagulation as an outpatient.  V-paced -- on Eliquis 5mg  BID  Secondary AV block: history of permanent transvenous pacemaker placed in the past for this. -- Plan for CRT upgrade at some point in the future according to Dr. Curt Bears although now  given his skin necrosis with the need for plastic surgical intervention and underlying infection the plan is to ultimately send him home on a LifeVest and readdress pacemaker upgrade in the future.  -- Lifevest placed yesterday  Acute renal insufficiency: serum creatinine increased from 1.2-6.7 probably related to ATN from his acute event. Seen by nephrology, now signed off. Cr down to  3.09.  Left upper extremity skin necrosis: probably related to IV infiltration of his norepinephrine.  Seen by plastic surgery, outpatient follow up  [place] will sign off.   Medication Recommendations:  noted above Other recommendations (labs, testing, etc):  none Follow up as an outpatient:  will arrange for outpatient follow up 3-4 weeks, can further adjust if he remains in CIR longer.  For questions or updates, please contact [place] Please consult [email or web address] for contact info under        Signed, Reino Bellis, NP  06/11/2021, 9:49 AM     Patient seen and examined. Agree with assessment and plan. No recurrent chest pain. Cr slowly improving down to 3.09 today. Lifevest placed yesterday; wearing today. Euvolemic on exam. On eliquis with AF.   Awaiting [facility] inpatient rehab placement.   Troy Sine, MD, [facility] 06/11/2021 10:45 AM

## 2021-06-11 NOTE — Progress Notes (Signed)
PROGRESS NOTE    Ross [name]  PJA:[id number] DOB: 24-Oct-1956 DOA: 05/31/2021 PCP: Orpah Melter, MD   Brief Narrative:  Mr. Ross [name] is a 65 y.o. M with hx AF and 2nd deg AVB with PPM, sCHF EF 25%, stable angina, MO, COPD not on O2, CAD s/pCABG 2007, HTN, and DM who presented with witnessed out of hospital cardiac arrest.     Defibrillated x10, amiodarone 450 and over an hour CPR time.  After which he was able to follow commands, taken to the Cath lab and found to have diffuse disease.     6/11: Cardiac arrest, intubated, underwent left heart cath 6/12: Extubated 6/16: Cr peaked at 6.8 6/18: Nephrology signed off  Currently pending CIR. Assessment & Plan   Prolonged out of hospital VT/V. fib cardiac arrest -Patient underwent urgent left heart catheterization showing 2 occluded grafts from his old CABG and medical therapy was recommended. -Patient had received CPR prior to admission along with 10 shocks following -He had a remarkable neurological recovery after the code and was extubated the next day -ICD is indicated, EP recommended that his necrosis of the skin due to extravasation of pressors resolved prior to ICD placement -Cardiology continues to follow -LifeVest in place -Patient will need to follow-up with electrophysiology as an outpatient -Patient is medically stable for discharge to inpatient rehab  Cognitive impairment -Likely post cardiac arrest anoxic injury -Patient noted to have some processing deficits and memory impairment with slow responses -SLUMS here on 6/17- 23/30-mostly in working memory and short-term memory tasks -Improving slowly  AKI/ATN -Creatinine peaked at 6.8 however baseline is approximately 1.1-1.2 -Nephrology consulted and appreciated and is since signed off as there is no need for dialysis at this time -Creatinine down to 3.09 today  Skin necrosis of the left arm and knee due to extravasation of Levophed -Plastic surgery consulted and  recommending Xeroform daily, clinical monitoring, no plan for excision or surgical debridement at this time -Patient to follow-up with outpatient plastic surgery, Dr. Aris Everts  Paroxysmal atrial fibrillation/history of second-degree AV block with pacemaker -Currently rate controlled, continue amiodarone, Coreg, Eliquis -Patient will need to continue amiodarone 400 mg twice daily for 2 weeks then switch to daily 200 mg   COPD -Currently not in exacerbation, no active wheezing on examination -Resume home medications  Diabetes mellitus, type II -Continue sliding scale insulin and CBG monitoring  Essential hypertension/chronic systolic CHF/ischemic cardiomyopathy/coronary disease -Appears euvolemic and compensated -Cardiology following -Entresto currently held as well as Farxiga-May resume when renal function improves -Continue aspirin, statin, Coreg, BiDil  Anemia of critical illness -Mild no clinical bleeding -Continue to monitor H/H  Post cardiac arrest cardiogenic shock/metabolic acidosis and lactic acidosis -Resolved  Acute respiratory failure with hypoxia -Due to cardiac arrest -Resolved  Hyponatremia  Possible aspiration pneumonia -Patient completed 5 days of Unasyn  DVT Prophylaxis Eliquis  Code Status: Full  Family Communication: None at bedside  Disposition Plan:  Status is: Inpatient  Remains inpatient appropriate because:Inpatient level of care appropriate due to severity of illness  Dispo: The patient is from: Home              Anticipated d/c is to: CIR              Patient currently is medically stable to d/c.   Difficult to place patient No  Consultants Cardiology Inpatient rehab Plastic surgery Nephrology  Procedures  Intubation/extubation Echocardiogram Cardiac catheterization  Antibiotics   Anti-infectives (From admission, onward)    Start  Dose/Rate Route Frequency Ordered Stop   06/02/21 2200  ampicillin-sulbactam (UNASYN) 1.5 g  in sodium chloride 0.9 % 100 mL IVPB  Status:  Discontinued        1.5 g 200 mL/hr over 30 Minutes Intravenous Every 12 hours 06/02/21 1242 06/05/21 1811   06/01/21 0000  ampicillin-sulbactam (UNASYN) 1.5 g in sodium chloride 0.9 % 100 mL IVPB  Status:  Discontinued        1.5 g 200 mL/hr over 30 Minutes Intravenous Every 6 hours 05/31/21 2311 06/02/21 1242       Subjective:   Ross [name] seen and examined today.  Patient complaining of nasal stuffiness and would like some saline.  Also complains that his back is itching him.  Denies current chest pain or shortness of breath, abdominal pain, nausea or vomiting, dizziness or headache.   Objective:   Vitals:   06/10/21 1141 06/10/21 2001 06/11/21 0425 06/11/21 1126  BP: (!) 193/99 139/62 (!) 130/54 (!) 122/53  Pulse: 63 62 (!) 59 (!) 57  Resp: 18 18 18 19   Temp: 97.9 F (36.6 C) 98.4 F (36.9 C) 98.1 F (36.7 C) 98.6 F (37 C)  TempSrc: Oral Oral Oral Oral  SpO2: 95% 97% 97% 94%  Weight:   109.3 kg   Height:        Intake/Output Summary (Last 24 hours) at 06/11/2021 1522 Last data filed at 06/11/2021 1310 Gross per 24 hour  Intake 120 ml  Output 1050 ml  Net -930 ml   Filed Weights   06/09/21 0004 06/10/21 0506 06/11/21 0425  Weight: 113 kg 111.8 kg 109.3 kg    Exam General: Well developed, well nourished, NAD, appears stated age 1: NCAT, mucous membranes moist.  Cardiovascular: S1 S2 auscultated, RRR Respiratory: Clear to auscultation bilaterally; wearing lifevest Abdomen: Soft, nontender, nondistended, + bowel sounds Extremities: warm dry without cyanosis clubbing or edema Neuro: AAOx3, nonfocal Psych: appropriate mood and affect   Data Reviewed: I have personally reviewed following labs and imaging studies  CBC: Recent Labs  Lab 06/05/21 0500 06/06/21 0156 06/07/21 0209  WBC 10.8* 10.3 10.1  HGB 11.6* 11.7* 11.9*  HCT 33.9* 34.6* 35.9*  MCV 91.6 91.3 92.8  PLT 137* 160 161   Basic Metabolic  Panel: Recent Labs  Lab 06/07/21 0209 06/08/21 0816 06/09/21 0908 06/10/21 0256 06/11/21 0238  NA 135 136 138 137 138  K 4.0 3.9 3.7 3.9 4.2  CL 105 107 108 108 108  CO2 20* 19* 21* 22 22  GLUCOSE 79 94 130* 102* 100*  BUN 77* 73* 65* 59* 53*  CREATININE 5.70* 4.77* 3.97* 3.51* 3.09*  CALCIUM 9.9 9.9 9.9 10.2 10.2   GFR: Estimated Creatinine Clearance: 30.7 mL/min (A) (by C-G formula based on SCr of 3.09 mg/dL (H)). Liver Function Tests: Recent Labs  Lab 06/08/21 0816  AST 37  ALT 68*  ALKPHOS 73  BILITOT 1.1  PROT 5.4*  ALBUMIN 2.8*   No results for input(s): LIPASE, AMYLASE in the last 168 hours. No results for input(s): AMMONIA in the last 168 hours. Coagulation Profile: No results for input(s): INR, PROTIME in the last 168 hours. Cardiac Enzymes: No results for input(s): CKTOTAL, CKMB, CKMBINDEX, TROPONINI in the last 168 hours. BNP (last 3 results) No results for input(s): PROBNP in the last 8760 hours. HbA1C: No results for input(s): HGBA1C in the last 72 hours. CBG: Recent Labs  Lab 06/10/21 1137 06/10/21 1508 06/10/21 2131 06/11/21 0553 06/11/21 1125  GLUCAP 109* 117*  109* 93 126*   Lipid Profile: No results for input(s): CHOL, HDL, LDLCALC, TRIG, CHOLHDL, LDLDIRECT in the last 72 hours. Thyroid Function Tests: No results for input(s): TSH, T4TOTAL, FREET4, T3FREE, THYROIDAB in the last 72 hours. Anemia Panel: No results for input(s): VITAMINB12, FOLATE, FERRITIN, TIBC, IRON, RETICCTPCT in the last 72 hours. Urine analysis:    Component Value Date/Time   COLORURINE YELLOW 06/07/2021 1501   APPEARANCEUR CLEAR 06/07/2021 1501   LABSPEC 1.012 06/07/2021 1501   PHURINE 5.0 06/07/2021 1501   GLUCOSEU 50 (A) 06/07/2021 1501   HGBUR MODERATE (A) 06/07/2021 1501   BILIRUBINUR NEGATIVE 06/07/2021 1501   KETONESUR NEGATIVE 06/07/2021 1501   PROTEINUR NEGATIVE 06/07/2021 1501   NITRITE NEGATIVE 06/07/2021 1501   LEUKOCYTESUR NEGATIVE 06/07/2021 1501    Sepsis Labs: @LABRCNTIP (procalcitonin:4,lacticidven:4)  ) Recent Results (from the past 240 hour(s))  MRSA Next Gen by PCR, Nasal     Status: None   Collection Time: 06/02/21  8:30 AM  Result Value Ref Range Status   MRSA by PCR Next Gen NOT DETECTED NOT DETECTED Final    Comment: (NOTE) The GeneXpert MRSA Assay (FDA approved for NASAL specimens only), is one component of a comprehensive MRSA colonization surveillance program. It is not intended to diagnose MRSA infection nor to guide or monitor treatment for MRSA infections. Test performance is not FDA approved in patients less than 8 years old. Performed at [facility], [place] [address] [place], [place] [address]   Culture, Urine     Status: None   Collection Time: 06/02/21  2:31 PM   Specimen: Urine, Catheterized  Result Value Ref Range Status   Specimen Description URINE, CATHETERIZED  Final   Special Requests NONE  Final   Culture   Final    NO GROWTH Performed at [facility], [address], [place], [place] [address]    Report Status 06/03/2021 FINAL  Final  Culture, blood (routine x 2)     Status: None   Collection Time: 06/02/21  4:59 PM   Specimen: BLOOD RIGHT HAND  Result Value Ref Range Status   Specimen Description BLOOD RIGHT HAND  Final   Special Requests   Final    BOTTLES DRAWN AEROBIC ONLY Blood Culture results may not be optimal due to an inadequate volume of blood received in culture bottles   Culture   Final    NO GROWTH 5 DAYS Performed at [facility], [place] [address] [place], [place] [address]    Report Status 06/07/2021 FINAL  Final  Culture, blood (routine x 2)     Status: None   Collection Time: 06/02/21  5:08 PM   Specimen: BLOOD RIGHT HAND  Result Value Ref Range Status   Specimen Description BLOOD RIGHT HAND  Final   Special Requests   Final    BOTTLES DRAWN AEROBIC ONLY Blood Culture results may not be optimal due to an inadequate volume of blood received in culture  bottles   Culture   Final    NO GROWTH 5 DAYS Performed at [facility], [place] [address], [place],  [address]    Report Status 06/07/2021 FINAL  Final  SARS CORONAVIRUS 2 (TAT 6-24 HRS) Nasopharyngeal Nasopharyngeal Swab     Status: None   Collection Time: 06/04/21  4:02 PM   Specimen: Nasopharyngeal Swab  Result Value Ref Range Status   SARS Coronavirus 2 NEGATIVE NEGATIVE Final    Comment: (NOTE) SARS-CoV-2 target nucleic acids  are NOT DETECTED.  The SARS-CoV-2 RNA is generally detectable in upper and lower respiratory specimens during the acute phase of infection. Negative results do not preclude SARS-CoV-2 infection, do not rule out co-infections with other pathogens, and should not be used as the sole basis for treatment or other patient management decisions. Negative results must be combined with clinical observations, patient history, and epidemiological information. The expected result is Negative.  Fact Sheet for Patients: SugarRoll.be  Fact Sheet for Healthcare Providers: https://www.woods-mathews.com/  This test is not yet approved or cleared by the [place] FDA and  has been authorized for detection and/or diagnosis of SARS-CoV-2 by FDA under an Emergency Use Authorization (EUA). This EUA will remain  in effect (meaning this test can be used) for the duration of the COVID-19 declaration under Se ction 564(b)(1) of the Act, 21 U.S.C. section 360bbb-3(b)(1), unless the authorization is terminated or revoked sooner.  Performed at [facility], [place] [address], [place], [place] [address]       Radiology Studies: No results found.   Scheduled Meds:  amiodarone  400 mg Oral BID   apixaban  5 mg Oral BID   aspirin  81 mg Oral Daily   atorvastatin  80 mg Oral Daily   carvedilol  6.25 mg Oral BID WC   insulin aspart  0-9 Units Subcutaneous TID WC   isosorbide-hydrALAZINE  1.5 tablet Oral TID    mouth rinse  15 mL Mouth Rinse BID   polyethylene glycol  17 g Oral Daily   senna-docusate  1 tablet Oral BID   sodium chloride flush  10-40 mL Intracatheter Q12H   sodium chloride flush  3 mL Intravenous Q12H   Continuous Infusions:  sodium chloride     sodium chloride       LOS: 11 days   Time Spent in minutes   45 minutes  Ishia Tenorio D.O. on 06/11/2021 at 3:22 PM  Between 7am to 7pm - Please see pager noted on [email or web address]  After 7pm go to [email or web address]  And look for the night coverage person covering for me after hours  [facility] Office  [phone]

## 2021-06-11 NOTE — PMR Pre-admission (Signed)
PMR Admission Coordinator Pre-Admission Assessment  Patient: Ross [name] is an 65 y.o., male MRN: [id number] DOB: 07-01-1956 Height: 6' 1.5" (186.7 cm) Weight: 109.3 kg  Insurance Information HMO: yes    PPO:      PCP:      IPA:      80/20:      OTHER:  PRIMARY: [employer] Medicare      Policy#: [id number]      Subscriber: pt CM Name: [name]      Phone#: [phone]     Fax#: [phone] Pre-Cert#: [id number] auth for CIR provided by [name] with updates due to fax listed above on 6/28      Employer:  Benefits:  Phone #: [phone]     Name:  Eff. Date: 12/21/20     Deduct: $0      Out of Pocket Max: $3600 (met $225)      Life Max: n/a CIR: $295/day for days 1-5      SNF: 20 full days Outpatient:      Co-Pay: $30/visit Home Health: 100%      Co-Pay:  DME: 80%     Co-Ins: 20% Providers:  SECONDARY:       Policy#:      Phone#:   Artist:       Phone#:   The Engineer, materials Information Summary" for patients in Inpatient Rehabilitation Facilities with attached "Privacy Act Statement-Health Care Records" was provided and verbally reviewed with: Patient and Family  Emergency Contact Information Contact Information     Name Relation Home Work Mobile   Alpha Daughter   [phone]   Willa Rough   [phone]       Current Medical History  Patient Admitting Diagnosis: anoxic BI 2/2 cardiac arrest with prolonged CPR (>90 minutes)  History of Present Illness: Pt is a 65 y/o male with PMH of AF, 2nd degree AVB with PPM, sCHF with EF 25%, COPD, CAD with CABG in 2007, HTN, and DM who presented to [facility] on 05/31/2021 with witnessed cardiac arrest.  Pt underwent >90 minutes of CPR with 10 defibrillations, and amio 450.  Pt was intubated on arrival and underwent L heart cath once ROSC achieved, requiring pressor support.   He was extubated on 6/12.  Creatinine continued to rise and nephrology was consulted for input.  Recommend conservative management and follow  for possible dialysis.  Creatinine peaked at 6.8 and then began to trend downward.  Pt making urine.  No need for dialysis and nephrology signed off.  Cardiology following and recommended life vest at discharge until recovered enough to consider more permanent device implantation.  Plastic surgery consulted for skin necrosis due to pressors and recommend conservative management with frequent dressing changes.  Therapy evaluations were completed and pt was recommended for CIR due to BI.      Patient's medical record from [facility] has been reviewed by the rehabilitation admission coordinator and physician.  Past Medical History  Past Medical History:  Diagnosis Date   Anxiety    Arthritis    "all over my body"   Atrial fibrillation (HCC)    rate control strategy; LA large   Cardiac arrest (HCC) 05/31/2021   Carpal tunnel syndrome    Cerebral aneurysm dx'd 05/03/2005   Complication of anesthesia    "I' was told that I'm a very high risk to be put to sleep; I may not come out of it" (01/16/2016)   COPD (chronic obstructive pulmonary disease) (HCC)  Coronary artery disease    a. s/p CABG;  b. Myoview (7/13):  apical anterior ischemia; c. LHC (08/2012): dLM 60%, mid LAD 90%, pCFX 40%, OM1 occluded, RCA 80-90%, distal RCA 60-70%, SVG-Dx patent, SVG-OM1 patent, LIMA-LAD patent, SVG-PDA occluded, EF 55%. => RCA dsz too long and would req multiple stents; pt refused CABG => med Rx.   Depression    DJD (degenerative joint disease)    Dyslipidemia    Dysrhythmia    Hx of echocardiogram    a. Echocardiogram (05/2013): Mild LVH, EF 54.8%, moderate LAE, mild aortic root dilatation, trace pulmonic regurgitation   Hypertension    Obesity    Peripheral neuropathy    Presence of permanent cardiac pacemaker    Type II diabetes mellitus ([place])    "haven't taken RX for 7-8 years" (01/16/2016)   Ventricular fibrillation ([place]) 05/31/2021    Family History   family history includes Alcohol abuse (age of  onset: 80) in his father; Asthma in his maternal grandmother and mother; Colon cancer in his paternal grandfather; Colon polyps (age of onset: 31) in his mother; Drug abuse (age of onset: 79) in his sister; Heart Problems in his maternal grandfather and paternal grandmother; Other in his sister; Other (age of onset: 49) in his brother.  Prior Rehab/Hospitalizations Has the patient had prior rehab or hospitalizations prior to admission? No  Has the patient had major surgery during 100 days prior to admission? Yes   Current Medications  Current Facility-Administered Medications:    0.9 %  sodium chloride infusion, 250 mL, Intravenous, PRN, Troy Sine, MD   0.9 %  sodium chloride infusion, 250 mL, Intravenous, Continuous, Stretch, Marily Lente, MD   acetaminophen (TYLENOL) tablet 650 mg, 650 mg, Oral, Q4H PRN, Troy Sine, MD   ALPRAZolam Duanne Moron) tablet 0.25 mg, 0.25 mg, Oral, TID PRN, Icard, Bradley L, DO, 0.25 mg at 06/11/21 2046   amiodarone (PACERONE) tablet 400 mg, 400 mg, Oral, BID, Lorretta Harp, MD, 400 mg at 06/12/21 4888   apixaban (ELIQUIS) tablet 5 mg, 5 mg, Oral, BID, Lorretta Harp, MD, 5 mg at 06/12/21 9169   aspirin chewable tablet 81 mg, 81 mg, Oral, Daily, Troy Sine, MD, 81 mg at 06/12/21 4503   atorvastatin (LIPITOR) tablet 80 mg, 80 mg, Oral, Daily, Troy Sine, MD, 80 mg at 06/12/21 8882   camphor-menthol (SARNA) lotion, , Topical, PRN, Cristal Ford, DO   carvedilol (COREG) tablet 6.25 mg, 6.25 mg, Oral, BID WC, Hochrein, Jeneen Rinks, MD, 6.25 mg at 06/12/21 0919   guaiFENesin (ROBITUSSIN) 100 MG/5ML solution 100 mg, 5 mL, Oral, Q4H PRN, Shahmehdi, Seyed A, MD, 100 mg at 06/06/21 0835   insulin aspart (novoLOG) injection 0-9 Units, 0-9 Units, Subcutaneous, TID WC, Danford, Suann Larry, MD, 1 Units at 06/09/21 1739   ipratropium-albuterol (DUONEB) 0.5-2.5 (3) MG/3ML nebulizer solution 3 mL, 3 mL, Nebulization, Q6H PRN, Stretch, Marily Lente, MD, 3 mL at  06/01/21 2259   isosorbide-hydrALAZINE (BIDIL) 20-37.5 MG per tablet 1.5 tablet, 1.5 tablet, Oral, TID, Dwyane Dee, MD, 1.5 tablet at 06/12/21 0919   MEDLINE mouth rinse, 15 mL, Mouth Rinse, BID, Wouk, Ailene Rud, MD, 15 mL at 06/11/21 0903   melatonin tablet 3 mg, 3 mg, Oral, QHS PRN, Opyd, Ilene Qua, MD   ondansetron (ZOFRAN) injection 4 mg, 4 mg, Intravenous, Q6H PRN, Troy Sine, MD, 4 mg at 05/31/21 2320   oxyCODONE-acetaminophen (PERCOCET/ROXICET) 5-325 MG per tablet 1-2 tablet, 1-2 tablet, Oral, Q3H PRN,  Garner Nash, DO, 2 tablet at 06/12/21 0930   polyethylene glycol (MIRALAX / GLYCOLAX) packet 17 g, 17 g, Oral, Daily, Alma Friendly, MD, 17 g at 06/12/21 0919   senna-docusate (Senokot-S) tablet 1 tablet, 1 tablet, Oral, BID, Alma Friendly, MD, 1 tablet at 06/12/21 0919   simethicone (MYLICON) chewable tablet 80 mg, 80 mg, Oral, QID PRN, Opyd, Ilene Qua, MD, 80 mg at 06/09/21 0427   sodium chloride (OCEAN) 0.65 % nasal spray 1 spray, 1 spray, Each Nare, PRN, Mikhail, Maryann, DO   sodium chloride flush (NS) 0.9 % injection 10-40 mL, 10-40 mL, Intracatheter, Q12H, Allred, James, MD, 10 mL at 06/12/21 0924   sodium chloride flush (NS) 0.9 % injection 10-40 mL, 10-40 mL, Intracatheter, PRN, Allred, James, MD, 10 mL at 06/03/21 2254   sodium chloride flush (NS) 0.9 % injection 3 mL, 3 mL, Intravenous, Q12H, Troy Sine, MD, 3 mL at 06/12/21 0925   sodium chloride flush (NS) 0.9 % injection 3 mL, 3 mL, Intravenous, PRN, Troy Sine, MD, 3 mL at 06/11/21 2048  Patients Current Diet:  Diet Order             Diet - low sodium heart healthy           Diet heart healthy/carb modified Room service appropriate? Yes; Fluid consistency: Thin  Diet effective now                   Precautions / Restrictions Precautions Precautions: Fall Precaution Comments: monitor BP/orthostatic symptoms, life vest Restrictions Weight Bearing Restrictions: No   Has  the patient had 2 or more falls or a fall with injury in the past year? Yes  Prior Activity Level Community (5-7x/wk): was independent prior to admission, no DME used at baseline, still driving and going out in the community without assistance  Prior Functional Level Self Care: Did the patient need help bathing, dressing, using the toilet or eating? Independent  Indoor Mobility: Did the patient need assistance with walking from room to room (with or without device)? Independent  Stairs: Did the patient need assistance with internal or external stairs (with or without device)? Independent  Functional Cognition: Did the patient need help planning regular tasks such as shopping or remembering to take medications? Independent  Home Assistive Devices / Equipment Home Assistive Devices/Equipment: None Home Equipment: None  Prior Device Use: Indicate devices/aids used by the patient prior to current illness, exacerbation or injury? None of the above  Current Functional Level Cognition  Arousal/Alertness: Awake/alert Overall Cognitive Status: Impaired/Different from baseline Current Attention Level: Sustained Orientation Level: Oriented X4 Following Commands: Follows one step commands with increased time Safety/Judgement: Decreased awareness of safety, Decreased awareness of deficits General Comments: pt states "my left leg is weak and sore, I don't even know why" even though he has necrotic wounds being addressed by plastics. Pt limited by anxiety, comes off as short and irritable. Requires step-by-step cues for functional tasks Attention: Alternating Alternating Attention: Appears intact Memory: Impaired Memory Impairment: Retrieval deficit, Prospective memory Awareness: Appears intact Problem Solving: Appears intact Behaviors: Poor frustration tolerance Safety/Judgment: Appears intact    Extremity Assessment (includes Sensation/Coordination)  Upper Extremity Assessment: Generalized  weakness, LUE deficits/detail LUE Deficits / Details: L UE edema, forearm wrapped LUE Coordination: decreased fine motor, decreased gross motor  Lower Extremity Assessment: Defer to PT evaluation    ADLs  Overall ADL's : Needs assistance/impaired Grooming: Supervision/safety, Standing Grooming Details (indicate cue type and reason):  leaning on sink to trim mustache Upper Body Bathing: Maximal assistance, Sitting Upper Body Bathing Details (indicate cue type and reason): to wash pts hair, pt unable to use LUE to wash L side of head d/t wounds on LUE Lower Body Bathing: Maximal assistance, +2 for physical assistance, +2 for safety/equipment, Sit to/from stand Upper Body Dressing : Moderate assistance, Sitting Lower Body Dressing: Maximal assistance, Sit to/from stand Lower Body Dressing Details (indicate cue type and reason): to don socks Toilet Transfer: Minimal assistance, Ambulation Toilet Transfer Details (indicate cue type and reason): simulated via functional mobility Functional mobility during ADLs: Minimal assistance, +2 for physical assistance, +2 for safety/equipment, Rolling walker General ADL Comments: pt continues to present with impaired activity tolerance, increased pain, and generalized deconditioning    Mobility  Overal bed mobility: Needs Assistance Bed Mobility: Rolling, Sidelying to Sit Rolling: Min guard Sidelying to sit: Min assist (hand hold to pull into sitting, pt unable to elevate with use of bed rail) Supine to sit: Mod assist, +2 for physical assistance Sit to supine: Mod assist, +2 for physical assistance General bed mobility comments: Pt recieved sitting EOB    Transfers  Overall transfer level: Needs assistance Equipment used: Rolling walker (2 wheeled) Transfers: Sit to/from Stand Sit to Stand: Min assist, From elevated surface Squat pivot transfers: Min assist General transfer comment: Pt requires min A to stand and steady prior to mobilizing     Ambulation / Gait / Stairs / Wheelchair Mobility  Ambulation/Gait Ambulation/Gait assistance: Min guard Gait Distance (Feet): 25 Feet (additional trials of 8 and 15') Assistive device: Rolling walker (2 wheeled) Gait Pattern/deviations: Step-through pattern General Gait Details: pt with slowed step-through gait, reduced stride length. Assistance for safety Gait velocity: reduced Gait velocity interpretation: <1.8 ft/sec, indicate of risk for recurrent falls    Posture / Balance Dynamic Sitting Balance Sitting balance - Comments: Leaning forward on table Balance Overall balance assessment: Needs assistance Sitting-balance support: No upper extremity supported, Feet supported Sitting balance-Leahy Scale: Fair Sitting balance - Comments: Leaning forward on table Standing balance support: Bilateral upper extremity supported Standing balance-Leahy Scale: Poor Standing balance comment: reliant on AD or external support.    Special needs/care consideration Life Vest to be ordered prior to admit to CIR and Diabetic management yes   Previous Home Environment (from acute therapy documentation) Living Arrangements: Alone Available Help at Discharge: Family Type of Home: House Home Layout: One level Home Access: Stairs to enter Technical brewer of Steps: 4 Bathroom Shower/Tub: Chiropodist: [place]: No  Discharge Living Setting Plans for Discharge Living Setting: Other (Comment) (discharge home with daughter) Type of Home at Discharge: House Discharge Home Layout: One level Discharge Home Access: Level entry Discharge Bathroom Shower/Tub: Walk-in shower, Tub/shower unit Discharge Bathroom Toilet: Standard Discharge Bathroom Accessibility: Yes How Accessible: Accessible via walker Does the patient have any problems obtaining your medications?: No  Social/Family/Support Systems Patient Roles: Associate Professor Information: Juliann Mule  (significant other) Anticipated Caregiver: daughter Valeda Malm Anticipated Caregiver's Contact Information: [phone] Ability/Limitations of Caregiver: n/a Caregiver Availability: 24/7 Discharge Plan Discussed with Primary Caregiver: Yes Is Caregiver In Agreement with Plan?: Yes Does Caregiver/Family have Issues with Lodging/Transportation while Pt is in Rehab?: No  Goals Patient/Family Goal for Rehab: PT/OT supervision to mod I, SLP supervision Expected length of stay: 14-18 days Additional Information: Pt can discharge home to his daughters house if he needs more assistance, or to his singificant other's home if he is doing well enough.  Pt/Family Agrees to Admission and willing to participate: Yes Program Orientation Provided & Reviewed with Pt/Caregiver Including Roles  & Responsibilities: Yes  Decrease burden of Care through IP rehab admission: n/a  Possible need for SNF placement upon discharge: Not anticipated.  Pt with excellent family support from daughter and significant other.   Patient Condition: I have reviewed medical records from [facility], spoken with CM, and patient and daughter. I met with patient at the bedside for inpatient rehabilitation assessment.  Patient will benefit from ongoing PT, OT, and SLP, can actively participate in 3 hours of therapy a day 5 days of the week, and can make measurable gains during the admission.  Patient will also benefit from the coordinated team approach during an Inpatient Acute Rehabilitation admission.  The patient will receive intensive therapy as well as Rehabilitation physician, nursing, social worker, and care management interventions.  Due to safety, skin/wound care, disease management, medication administration, pain management, and patient education the patient requires 24 hour a day rehabilitation nursing.  The patient is currently min to mod with mobility and basic ADLs.  Discharge setting and therapy post discharge at home  with home health is anticipated.  Patient has agreed to participate in the Acute Inpatient Rehabilitation Program and will admit today.  Preadmission Screen Completed By:  Michel Santee, PT, DPT 06/12/2021 10:48 AM ______________________________________________________________________   Discussed status with Dr. Dagoberto Ligas on 06/12/21  at .now  and received approval for admission today.  Admission Coordinator:  Michel Santee, PT, DPT time 10:48 AM Sudie Grumbling 06/12/21   Assessment/Plan: Diagnosis: Does the need for close, 24 hr/day Medical supervision in concert with the patient's rehab needs make it unreasonable for this patient to be served in a less intensive setting? Yes Co-Morbidities requiring supervision/potential complications: aNOXIC BRAIN INJURY , chf ef 25%, copd, CARDIAC ARREST, SKIN NECROSIS/pressors Due to bladder management, bowel management, safety, skin/wound care, disease management, medication administration, pain management, and patient education, does the patient require 24 hr/day rehab nursing? Yes Does the patient require coordinated care of a physician, rehab nurse, PT, OT, and SLP to address physical and functional deficits in the context of the above medical diagnosis(es)? Yes Addressing deficits in the following areas: balance, endurance, locomotion, strength, transferring, bowel/bladder control, bathing, dressing, feeding, grooming, toileting, cognition, language, swallowing, and psychosocial support Can the patient actively participate in an intensive therapy program of at least 3 hrs of therapy 5 days a week? Yes The potential for patient to make measurable gains while on inpatient rehab is good and fair Anticipated functional outcomes upon discharge from inpatient rehab: modified independent and supervision PT, modified independent and supervision OT, supervision SLP Estimated rehab length of stay to reach the above functional goals is: 14-18 days Anticipated discharge  destination: Home 10. Overall Rehab/Functional Prognosis: good and fair   MD Signature:

## 2021-06-11 NOTE — Progress Notes (Signed)
Inpatient Rehab Admissions Coordinator:   Received request for more info regarding PLOF and dispo as well as medical readiness from insurance company, which I have faxed.  Will continue to follow for updates.   Shann Medal, PT, DPT Admissions Coordinator [phone] 06/11/21  10:45 AM

## 2021-06-12 ENCOUNTER — Inpatient Hospital Stay (HOSPITAL_COMMUNITY)
Admission: RE | Admit: 2021-06-12 | Discharge: 2021-06-27 | DRG: 092 | Disposition: A | Discharge status: Home Health | Payer: Medicare Other | Source: Intra-hospital | Attending: Physical Medicine and Rehabilitation | Admitting: Physical Medicine and Rehabilitation

## 2021-06-12 ENCOUNTER — Encounter (HOSPITAL_COMMUNITY): Payer: Self-pay | Admitting: Physical Medicine and Rehabilitation

## 2021-06-12 ENCOUNTER — Encounter: Payer: Self-pay | Admitting: Podiatry

## 2021-06-12 ENCOUNTER — Other Ambulatory Visit: Payer: Self-pay

## 2021-06-12 DIAGNOSIS — I130 Hypertensive heart and chronic kidney disease with heart failure and stage 1 through stage 4 chronic kidney disease, or unspecified chronic kidney disease: Secondary | ICD-10-CM | POA: Diagnosis not present

## 2021-06-12 DIAGNOSIS — I469 Cardiac arrest, cause unspecified: Secondary | ICD-10-CM | POA: Diagnosis present

## 2021-06-12 DIAGNOSIS — N179 Acute kidney failure, unspecified: Secondary | ICD-10-CM | POA: Diagnosis present

## 2021-06-12 DIAGNOSIS — F419 Anxiety disorder, unspecified: Secondary | ICD-10-CM | POA: Diagnosis present

## 2021-06-12 DIAGNOSIS — I5022 Chronic systolic (congestive) heart failure: Secondary | ICD-10-CM | POA: Diagnosis present

## 2021-06-12 DIAGNOSIS — Z87891 Personal history of nicotine dependence: Secondary | ICD-10-CM

## 2021-06-12 DIAGNOSIS — I119 Hypertensive heart disease without heart failure: Secondary | ICD-10-CM | POA: Diagnosis not present

## 2021-06-12 DIAGNOSIS — G931 Anoxic brain damage, not elsewhere classified: Principal | ICD-10-CM

## 2021-06-12 DIAGNOSIS — E1159 Type 2 diabetes mellitus with other circulatory complications: Secondary | ICD-10-CM | POA: Diagnosis not present

## 2021-06-12 DIAGNOSIS — J449 Chronic obstructive pulmonary disease, unspecified: Secondary | ICD-10-CM | POA: Diagnosis not present

## 2021-06-12 DIAGNOSIS — Z79899 Other long term (current) drug therapy: Secondary | ICD-10-CM

## 2021-06-12 DIAGNOSIS — I255 Ischemic cardiomyopathy: Secondary | ICD-10-CM | POA: Diagnosis not present

## 2021-06-12 DIAGNOSIS — L97829 Non-pressure chronic ulcer of other part of left lower leg with unspecified severity: Secondary | ICD-10-CM | POA: Diagnosis present

## 2021-06-12 DIAGNOSIS — I2510 Atherosclerotic heart disease of native coronary artery without angina pectoris: Secondary | ICD-10-CM | POA: Diagnosis not present

## 2021-06-12 DIAGNOSIS — I5042 Chronic combined systolic (congestive) and diastolic (congestive) heart failure: Secondary | ICD-10-CM | POA: Diagnosis not present

## 2021-06-12 DIAGNOSIS — L89142 Pressure ulcer of left lower back, stage 2: Secondary | ICD-10-CM | POA: Diagnosis present

## 2021-06-12 DIAGNOSIS — Z950 Presence of cardiac pacemaker: Secondary | ICD-10-CM

## 2021-06-12 DIAGNOSIS — G3184 Mild cognitive impairment, so stated: Secondary | ICD-10-CM | POA: Diagnosis present

## 2021-06-12 DIAGNOSIS — R0789 Other chest pain: Secondary | ICD-10-CM | POA: Diagnosis present

## 2021-06-12 DIAGNOSIS — E785 Hyperlipidemia, unspecified: Secondary | ICD-10-CM | POA: Diagnosis present

## 2021-06-12 DIAGNOSIS — E1140 Type 2 diabetes mellitus with diabetic neuropathy, unspecified: Secondary | ICD-10-CM | POA: Diagnosis not present

## 2021-06-12 DIAGNOSIS — M170 Bilateral primary osteoarthritis of knee: Secondary | ICD-10-CM | POA: Diagnosis not present

## 2021-06-12 DIAGNOSIS — T8089XD Other complications following infusion, transfusion and therapeutic injection, subsequent encounter: Secondary | ICD-10-CM

## 2021-06-12 DIAGNOSIS — Z8674 Personal history of sudden cardiac arrest: Secondary | ICD-10-CM

## 2021-06-12 DIAGNOSIS — R079 Chest pain, unspecified: Secondary | ICD-10-CM

## 2021-06-12 DIAGNOSIS — E1165 Type 2 diabetes mellitus with hyperglycemia: Secondary | ICD-10-CM | POA: Diagnosis not present

## 2021-06-12 DIAGNOSIS — I4891 Unspecified atrial fibrillation: Secondary | ICD-10-CM | POA: Diagnosis not present

## 2021-06-12 DIAGNOSIS — E1122 Type 2 diabetes mellitus with diabetic chronic kidney disease: Secondary | ICD-10-CM | POA: Diagnosis not present

## 2021-06-12 DIAGNOSIS — I441 Atrioventricular block, second degree: Secondary | ICD-10-CM | POA: Diagnosis present

## 2021-06-12 DIAGNOSIS — K5900 Constipation, unspecified: Secondary | ICD-10-CM | POA: Diagnosis present

## 2021-06-12 DIAGNOSIS — N184 Chronic kidney disease, stage 4 (severe): Secondary | ICD-10-CM | POA: Diagnosis not present

## 2021-06-12 DIAGNOSIS — E875 Hyperkalemia: Secondary | ICD-10-CM | POA: Diagnosis present

## 2021-06-12 DIAGNOSIS — M79645 Pain in left finger(s): Secondary | ICD-10-CM | POA: Diagnosis present

## 2021-06-12 DIAGNOSIS — E7800 Pure hypercholesterolemia, unspecified: Secondary | ICD-10-CM | POA: Diagnosis not present

## 2021-06-12 DIAGNOSIS — K5909 Other constipation: Secondary | ICD-10-CM | POA: Diagnosis present

## 2021-06-12 DIAGNOSIS — Y848 Other medical procedures as the cause of abnormal reaction of the patient, or of later complication, without mention of misadventure at the time of the procedure: Secondary | ICD-10-CM | POA: Diagnosis present

## 2021-06-12 DIAGNOSIS — Z7901 Long term (current) use of anticoagulants: Secondary | ICD-10-CM

## 2021-06-12 DIAGNOSIS — L8990 Pressure ulcer of unspecified site, unspecified stage: Secondary | ICD-10-CM | POA: Insufficient documentation

## 2021-06-12 DIAGNOSIS — I10 Essential (primary) hypertension: Secondary | ICD-10-CM | POA: Diagnosis not present

## 2021-06-12 DIAGNOSIS — I4901 Ventricular fibrillation: Secondary | ICD-10-CM | POA: Diagnosis not present

## 2021-06-12 DIAGNOSIS — I96 Gangrene, not elsewhere classified: Secondary | ICD-10-CM | POA: Diagnosis present

## 2021-06-12 DIAGNOSIS — R1311 Dysphagia, oral phase: Secondary | ICD-10-CM | POA: Diagnosis present

## 2021-06-12 DIAGNOSIS — I4819 Other persistent atrial fibrillation: Secondary | ICD-10-CM | POA: Diagnosis present

## 2021-06-12 DIAGNOSIS — Z7984 Long term (current) use of oral hypoglycemic drugs: Secondary | ICD-10-CM

## 2021-06-12 DIAGNOSIS — Z7951 Long term (current) use of inhaled steroids: Secondary | ICD-10-CM

## 2021-06-12 DIAGNOSIS — Z825 Family history of asthma and other chronic lower respiratory diseases: Secondary | ICD-10-CM

## 2021-06-12 DIAGNOSIS — Z951 Presence of aortocoronary bypass graft: Secondary | ICD-10-CM

## 2021-06-12 LAB — APTT: aPTT: 31 seconds (ref 24–36)

## 2021-06-12 LAB — BASIC METABOLIC PANEL
Anion gap: 9 (ref 5–15)
BUN: 47 mg/dL — ABNORMAL HIGH (ref 8–23)
CO2: 21 mmol/L — ABNORMAL LOW (ref 22–32)
Calcium: 10.5 mg/dL — ABNORMAL HIGH (ref 8.9–10.3)
Chloride: 108 mmol/L (ref 98–111)
Creatinine, Ser: 2.94 mg/dL — ABNORMAL HIGH (ref 0.61–1.24)
GFR, Estimated: 23 mL/min — ABNORMAL LOW (ref >60)
Glucose, Bld: 97 mg/dL (ref 70–99)
Potassium: 4.1 mmol/L (ref 3.5–5.1)
Sodium: 138 mmol/L (ref 135–145)

## 2021-06-12 LAB — GLUCOSE, CAPILLARY
Glucose-Capillary: 96 mg/dL (ref 70–99)
Glucose-Capillary: 102 mg/dL — ABNORMAL HIGH (ref 70–99)
Glucose-Capillary: 97 mg/dL (ref 70–99)
Glucose-Capillary: 124 mg/dL — ABNORMAL HIGH (ref 70–99)

## 2021-06-12 MED ORDER — ALPRAZOLAM 0.25 MG PO TABS
0.2500 mg | ORAL_TABLET | Freq: Three times a day (TID) | ORAL | Status: DC | PRN
  Administered 2021-06-12 – 2021-06-26 (×19): 0.25 mg via ORAL
  Filled 2021-06-12 (×20): qty 1

## 2021-06-12 MED ORDER — ASPIRIN 81 MG PO CHEW: 81.0000 mg | CHEWABLE_TABLET | Freq: Every day | ORAL | Status: DC

## 2021-06-12 MED ORDER — SENNOSIDES-DOCUSATE SODIUM 8.6-50 MG PO TABS
2.0000 | ORAL_TABLET | Freq: Every day | ORAL | Status: DC
  Administered 2021-06-13 – 2021-06-26 (×14): 2 via ORAL
  Filled 2021-06-12 (×14): qty 2

## 2021-06-12 MED ORDER — ASPIRIN 81 MG PO CHEW
81.0000 mg | CHEWABLE_TABLET | Freq: Every day | ORAL | Status: DC
  Administered 2021-06-13 – 2021-06-27 (×15): 81 mg via ORAL
  Filled 2021-06-12 (×15): qty 1

## 2021-06-12 MED ORDER — ATORVASTATIN CALCIUM 80 MG PO TABS
80.0000 mg | ORAL_TABLET | Freq: Every day | ORAL | Status: DC
  Administered 2021-06-13 – 2021-06-27 (×15): 80 mg via ORAL
  Filled 2021-06-12 (×15): qty 1

## 2021-06-12 MED ORDER — OXYCODONE-ACETAMINOPHEN 5-325 MG PO TABS
1.0000 | ORAL_TABLET | ORAL | Status: DC | PRN
  Administered 2021-06-12: 2 via ORAL
  Administered 2021-06-13: 1 via ORAL
  Administered 2021-06-13 – 2021-06-14 (×4): 2 via ORAL
  Administered 2021-06-15: 1 via ORAL
  Administered 2021-06-15 – 2021-06-16 (×4): 2 via ORAL
  Administered 2021-06-16: 1 via ORAL
  Administered 2021-06-17 (×2): 2 via ORAL
  Administered 2021-06-18 (×2): 1 via ORAL
  Administered 2021-06-18 – 2021-06-26 (×24): 2 via ORAL
  Filled 2021-06-12 (×8): qty 2
  Filled 2021-06-12: qty 1
  Filled 2021-06-12 (×3): qty 2
  Filled 2021-06-12: qty 1
  Filled 2021-06-12 (×18): qty 2
  Filled 2021-06-12: qty 1
  Filled 2021-06-12 (×10): qty 2

## 2021-06-12 MED ORDER — AMIODARONE HCL 200 MG PO TABS
400.0000 mg | ORAL_TABLET | Freq: Two times a day (BID) | ORAL | Status: AC
  Administered 2021-06-12 – 2021-06-18 (×13): 400 mg via ORAL
  Filled 2021-06-12 (×14): qty 2

## 2021-06-12 MED ORDER — ATORVASTATIN CALCIUM 80 MG PO TABS: 80.0000 mg | ORAL_TABLET | Freq: Every day | ORAL | Status: DC

## 2021-06-12 MED ORDER — GUAIFENESIN-DM 100-10 MG/5ML PO SYRP
5.0000 mL | ORAL_SOLUTION | Freq: Four times a day (QID) | ORAL | Status: DC | PRN
  Filled 2021-06-12: qty 10

## 2021-06-12 MED ORDER — GABAPENTIN 100 MG PO CAPS
100.0000 mg | ORAL_CAPSULE | Freq: Every day | ORAL | Status: DC
  Administered 2021-06-12: 100 mg via ORAL
  Filled 2021-06-12 (×2): qty 1

## 2021-06-12 MED ORDER — ORAL CARE MOUTH RINSE
15.0000 mL | Freq: Two times a day (BID) | OROMUCOSAL | Status: DC
  Administered 2021-06-13 – 2021-06-27 (×25): 15 mL via OROMUCOSAL

## 2021-06-12 MED ORDER — BISACODYL 10 MG RE SUPP: 10.0000 mg | Freq: Every day | RECTAL | Status: DC | PRN

## 2021-06-12 MED ORDER — IPRATROPIUM-ALBUTEROL 0.5-2.5 (3) MG/3ML IN SOLN: 3.0000 mL | Freq: Four times a day (QID) | RESPIRATORY_TRACT | Status: DC | PRN

## 2021-06-12 MED ORDER — SALINE SPRAY 0.65 % NA SOLN
1.0000 | NASAL | Status: DC | PRN
  Filled 2021-06-12: qty 44

## 2021-06-12 MED ORDER — SIMETHICONE 80 MG PO CHEW
80.0000 mg | CHEWABLE_TABLET | Freq: Four times a day (QID) | ORAL | Status: DC | PRN
  Filled 2021-06-12: qty 1

## 2021-06-12 MED ORDER — ALPRAZOLAM 0.25 MG PO TABS: 0.2500 mg | ORAL_TABLET | Freq: Three times a day (TID) | ORAL | Status: DC | PRN

## 2021-06-12 MED ORDER — POLYETHYLENE GLYCOL 3350 17 G PO PACK: 17.0000 g | PACK | Freq: Every day | ORAL | Status: DC | PRN

## 2021-06-12 MED ORDER — ACETAMINOPHEN 325 MG PO TABS: 325.0000 mg | ORAL_TABLET | ORAL | Status: DC | PRN

## 2021-06-12 MED ORDER — ISOSORB DINITRATE-HYDRALAZINE 20-37.5 MG PO TABS
1.5000 | ORAL_TABLET | Freq: Three times a day (TID) | ORAL | Status: DC
  Administered 2021-06-12 – 2021-06-27 (×41): 1.5 via ORAL
  Filled 2021-06-12: qty 2
  Filled 2021-06-12: qty 1.5
  Filled 2021-06-12: qty 2
  Filled 2021-06-12 (×4): qty 1.5
  Filled 2021-06-12: qty 2
  Filled 2021-06-12 (×3): qty 1.5
  Filled 2021-06-12: qty 2
  Filled 2021-06-12 (×3): qty 1.5
  Filled 2021-06-12: qty 2
  Filled 2021-06-12 (×3): qty 1.5
  Filled 2021-06-12 (×7): qty 2
  Filled 2021-06-12: qty 1.5
  Filled 2021-06-12 (×2): qty 2
  Filled 2021-06-12: qty 1.5
  Filled 2021-06-12 (×8): qty 2
  Filled 2021-06-12 (×3): qty 1.5
  Filled 2021-06-12 (×2): qty 2
  Filled 2021-06-12: qty 1.5
  Filled 2021-06-12 (×2): qty 2

## 2021-06-12 MED ORDER — CARVEDILOL 6.25 MG PO TABS
6.2500 mg | ORAL_TABLET | Freq: Two times a day (BID) | ORAL | Status: DC
  Administered 2021-06-12 – 2021-06-27 (×30): 6.25 mg via ORAL
  Filled 2021-06-12 (×30): qty 1

## 2021-06-12 MED ORDER — AMIODARONE HCL 400 MG PO TABS: 400.0000 mg | ORAL_TABLET | Freq: Two times a day (BID) | ORAL | Status: DC

## 2021-06-12 MED ORDER — PROCHLORPERAZINE MALEATE 5 MG PO TABS: 5.0000 mg | ORAL_TABLET | Freq: Four times a day (QID) | ORAL | Status: DC | PRN

## 2021-06-12 MED ORDER — APIXABAN 5 MG PO TABS
5.0000 mg | ORAL_TABLET | Freq: Two times a day (BID) | ORAL | Status: DC
  Administered 2021-06-12 – 2021-06-27 (×30): 5 mg via ORAL
  Filled 2021-06-12 (×31): qty 1

## 2021-06-12 MED ORDER — DIPHENHYDRAMINE HCL 12.5 MG/5ML PO ELIX: 12.5000 mg | ORAL_SOLUTION | Freq: Four times a day (QID) | ORAL | Status: DC | PRN

## 2021-06-12 MED ORDER — MELATONIN 3 MG PO TABS
3.0000 mg | ORAL_TABLET | Freq: Every evening | ORAL | Status: DC | PRN
  Administered 2021-06-12 – 2021-06-26 (×2): 3 mg via ORAL
  Filled 2021-06-12 (×2): qty 1

## 2021-06-12 MED ORDER — AMIODARONE HCL 200 MG PO TABS
200.0000 mg | ORAL_TABLET | Freq: Every day | ORAL | Status: DC
  Administered 2021-06-19 – 2021-06-27 (×9): 200 mg via ORAL
  Filled 2021-06-12 (×9): qty 1

## 2021-06-12 MED ORDER — INSULIN ASPART 100 UNIT/ML IJ SOLN: 0.0000 [IU] | Freq: Every day | INTRAMUSCULAR | Status: DC

## 2021-06-12 MED ORDER — FLEET ENEMA 7-19 GM/118ML RE ENEM: 1.0000 | ENEMA | Freq: Once | RECTAL | Status: DC | PRN

## 2021-06-12 MED ORDER — CAMPHOR-MENTHOL 0.5-0.5 % EX LOTN
TOPICAL_LOTION | Freq: Three times a day (TID) | CUTANEOUS | Status: DC
  Administered 2021-06-23: 1 via TOPICAL
  Filled 2021-06-12: qty 222

## 2021-06-12 MED ORDER — POLYETHYLENE GLYCOL 3350 17 G PO PACK
17.0000 g | PACK | Freq: Two times a day (BID) | ORAL | Status: DC
  Administered 2021-06-12 – 2021-06-27 (×30): 17 g via ORAL
  Filled 2021-06-12 (×30): qty 1

## 2021-06-12 MED ORDER — CAMPHOR-MENTHOL 0.5-0.5 % EX LOTN
TOPICAL_LOTION | CUTANEOUS | Status: DC | PRN
  Filled 2021-06-12: qty 222

## 2021-06-12 MED ORDER — PROCHLORPERAZINE EDISYLATE 10 MG/2ML IJ SOLN: 5.0000 mg | Freq: Four times a day (QID) | INTRAMUSCULAR | Status: DC | PRN

## 2021-06-12 MED ORDER — ISOSORB DINITRATE-HYDRALAZINE 20-37.5 MG PO TABS: 1.5000 | ORAL_TABLET | Freq: Three times a day (TID) | ORAL | Status: DC

## 2021-06-12 MED ORDER — TRAZODONE HCL 50 MG PO TABS
25.0000 mg | ORAL_TABLET | Freq: Every evening | ORAL | Status: DC | PRN
  Filled 2021-06-12: qty 1

## 2021-06-12 MED ORDER — ASCORBIC ACID 500 MG PO TABS
500.0000 mg | ORAL_TABLET | Freq: Two times a day (BID) | ORAL | Status: DC
  Administered 2021-06-12 – 2021-06-27 (×30): 500 mg via ORAL
  Filled 2021-06-12 (×31): qty 1

## 2021-06-12 MED ORDER — PROCHLORPERAZINE 25 MG RE SUPP: 12.5000 mg | Freq: Four times a day (QID) | RECTAL | Status: DC | PRN

## 2021-06-12 MED ORDER — ALUM & MAG HYDROXIDE-SIMETH 200-200-20 MG/5ML PO SUSP: 30.0000 mL | ORAL | Status: DC | PRN

## 2021-06-12 MED ORDER — INSULIN ASPART 100 UNIT/ML IJ SOLN
0.0000 [IU] | Freq: Two times a day (BID) | INTRAMUSCULAR | Status: DC
  Administered 2021-06-13: 2 [IU] via SUBCUTANEOUS

## 2021-06-12 MED ORDER — JUVEN PO PACK
1.0000 | PACK | Freq: Two times a day (BID) | ORAL | Status: DC
  Administered 2021-06-13 – 2021-06-24 (×24): 1 via ORAL
  Filled 2021-06-12 (×24): qty 1

## 2021-06-12 MED ORDER — INSULIN ASPART 100 UNIT/ML IJ SOLN: 0.0000 [IU] | Freq: Three times a day (TID) | INTRAMUSCULAR | Status: DC
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
## 2021-06-12 NOTE — Progress Notes (Signed)
Report given to [facility] on [facility].

## 2021-06-12 NOTE — Progress Notes (Signed)
INPATIENT REHABILITATION ADMISSION NOTE   Arrival Method: Wheelchair     Mental Orientation: A&O x4   Assessment: Completed   Skin: wounds measured and documented    IV'S: 2 right forearm   Pain: yes   Tubes and Drains: N/A   Safety Measures: bed alarm activated, 3 side rails, call bell at side   Vital Signs: completed   Height and Weight: completed   Rehab Orientation: completed   Family: notified    Notes: Introduced myself to patient and explained my role in his care. Assisted nurse in measuring wounds. Educated patient on safety precautions, rehab schedule, rehab process, with verbal understanding. Patient positioned in bed comfortably with call bell at side, and bed alarm on.  Dorthula Nettles, RN3, BSN, CBIS, [place], [facility], Inpatient Rehabilitation Office [phone] Cell [phone]

## 2021-06-12 NOTE — Discharge Summary (Signed)
Physician Discharge Summary  Ross [name] ONG:[id number] DOB: 1955-12-23 DOA: 05/31/2021  PCP: Orpah Melter, MD  Admit date: 05/31/2021 Discharge date: 06/12/2021  Time spent: 45 minutes  Recommendations for Outpatient Follow-up:  Patient will be discharged to inpatient rehab.  Patient will need to follow up with primary care provider within one week of discharge. Continue to monitor renal function.  Follow up with cardiology and plastic surgery on discharge. Patient should continue medications as prescribed.  Patient should follow a heart healthy/carb modified diet.    Discharge Diagnoses:  Prolonged out of hospital VT/V. fib cardiac arrest Cognitive impairment AKI/ATN Skin necrosis of the left arm and knee due to extravasation of Levophed Paroxysmal atrial fibrillation/history of second-degree AV block with pacemaker COPD Diabetes mellitus, type II Essential hypertension/chronic systolic CHF/ischemic cardiomyopathy/coronary disease Anemia of critical illness Post cardiac arrest cardiogenic shock/metabolic acidosis and lactic acidosis Acute respiratory failure with hypoxia Hyponatremia Possible aspiration pneumonia  Discharge Condition: Stable  Diet recommendation: heart healthy/carb modified  Filed Weights   06/10/21 0506 06/11/21 0425 06/12/21 0400  Weight: 111.8 kg 109.3 kg 109.3 kg    History of present illness:  Ross [name] is a 65 y.o. M with hx AF and 2nd deg AVB with PPM, sCHF EF 25%, stable angina, MO, COPD not on O2, CAD s/pCABG 2007, HTN, and DM who presented with witnessed out of hospital cardiac arrest.     Defibrillated x10, amiodarone 450 and over an hour CPR time.  After which he was able to follow commands, taken to the Cath lab and found to have diffuse disease.  Hospital Course:  Prolonged out of hospital VT/V. fib cardiac arrest -Patient underwent urgent left heart catheterization showing 2 occluded grafts from his old CABG and medical therapy was  recommended. -Patient had received CPR prior to admission along with 10 shocks following -He had a remarkable neurological recovery after the code and was extubated the next day -ICD is indicated, EP recommended that his necrosis of the skin due to extravasation of pressors resolved prior to ICD placement -Cardiology continues to follow -LifeVest in place -Patient will need to follow-up with electrophysiology as an outpatient   Cognitive impairment -Likely post cardiac arrest anoxic injury -Patient noted to have some processing deficits and memory impairment with slow responses -SLUMS here on 6/17- 23/30-mostly in working memory and short-term memory tasks -Improving slowly   AKI/ATN -Creatinine peaked at 6.8 however baseline is approximately 1.1-1.2 -Nephrology consulted and appreciated and is since signed off as there is no need for dialysis at this time -Creatinine down to 2.94 today   Skin necrosis of the left arm and knee due to extravasation of Levophed -Plastic surgery consulted and recommending Xeroform daily, clinical monitoring, no plan for excision or surgical debridement at this time -Patient to follow-up with outpatient plastic surgery, Mr. Aris Everts, PA   Paroxysmal atrial fibrillation/history of second-degree AV block with pacemaker -Currently rate controlled, continue amiodarone, Coreg, Eliquis -Patient will need to continue amiodarone 400 mg twice daily for 2 weeks total (started on 06/04/2021) then switch to daily 200 mg   COPD -Currently not in exacerbation, no active wheezing on examination -Resume home medications   Diabetes mellitus, type II -Continue sliding scale insulin and CBG monitoring   Essential hypertension/chronic systolic CHF/ischemic cardiomyopathy/coronary disease -Appears euvolemic and compensated -Cardiology following -Entresto currently held as well as Farxiga-May resume when renal function improves -Continue aspirin, statin, Coreg, BiDil    Anemia of critical illness -Mild no clinical bleeding -Stable  Post cardiac arrest cardiogenic shock/metabolic acidosis and lactic acidosis -Resolved   Acute respiratory failure with hypoxia -Due to cardiac arrest -Resolved   Hyponatremia   Possible aspiration pneumonia -Patient completed 5 days of Unasyn  Consultants Cardiology Inpatient rehab Plastic surgery Nephrology   Procedures  Intubation/extubation Echocardiogram Cardiac catheterization    Discharge Exam: Vitals:   06/11/21 2021 06/12/21 0400  BP: (!) 107/56 139/61  Pulse: 63 (!) 59  Resp: 16 16  Temp: 98.9 F (37.2 C) 98.3 F (36.8 C)  SpO2: 96% 96%    General: Well developed, well nourished, NAD, appears stated age HEENT: NCAT, mucous membranes moist. Cardiovascular: S1 S2 auscultated, RRR Respiratory: Clear to auscultation bilaterally  Abdomen: Soft, nontender, nondistended, + bowel sounds Extremities: warm dry without cyanosis clubbing or edema Neuro: AAOx3, nonfocal Skin: Dressing in place on left arm as well as lower extremity Psych: appropriate  Discharge Instructions Discharge Instructions     Diet - low sodium heart healthy   Complete by: As directed    Discharge wound care:   Complete by: As directed    Cleanse left forearm with Ns and pat dry.  Apply Xeroform gauze to area. Cover with kerlix and tape.  Change daily.      Allergies as of 06/12/2021   No Known Allergies      Medication List     STOP taking these medications    dapagliflozin propanediol 5 MG Tabs tablet Commonly known as: Farxiga   Entresto 24-26 MG Generic drug: sacubitril-valsartan   simvastatin 20 MG tablet Commonly known as: ZOCOR       TAKE these medications    albuterol 108 (90 Base) MCG/ACT inhaler Commonly known as: VENTOLIN HFA Inhale 1-2 puffs into the lungs every 6 (six) hours as needed for wheezing or shortness of breath.   ALPRAZolam 0.25 MG tablet Commonly known as: XANAX Take 1  tablet (0.25 mg total) by mouth 3 (three) times daily as needed for anxiety. What changed:  medication strength how much to take when to take this reasons to take this additional instructions   amiodarone 400 MG tablet Commonly known as: PACERONE Take 1 tablet (400 mg total) by mouth 2 (two) times daily.   apixaban 5 MG Tabs tablet Commonly known as: ELIQUIS Take 5 mg by mouth 2 (two) times daily.   aspirin 81 MG chewable tablet Chew 1 tablet (81 mg total) by mouth daily. Start taking on: June 13, 2021   atorvastatin 80 MG tablet Commonly known as: LIPITOR Take 1 tablet (80 mg total) by mouth daily. Start taking on: June 13, 2021   budesonide-formoterol 160-4.5 MCG/ACT inhaler Commonly known as: SYMBICORT Inhale 2 puffs into the lungs 4 (four) times a week.   CALCIUM-D PO Take 1 tablet by mouth 4 (four) times a week.   carvedilol 25 MG tablet Commonly known as: COREG Take 6.25 mg by mouth 2 (two) times daily with a meal. 1/4 tablet - 6.25 mg   isosorbide-hydrALAZINE 20-37.5 MG tablet Commonly known as: BIDIL Take 1.5 tablets by mouth 3 (three) times daily.   linaclotide 145 MCG Caps capsule Commonly known as: LINZESS Take 145 mcg by mouth daily as needed (constipation).   Oxycodone HCl 10 MG Tabs Take 2.5-10 mg by mouth 3 (three) times daily as needed (pain).   tiotropium 18 MCG inhalation capsule Commonly known as: SPIRIVA Place 18 mcg into inhaler and inhale 4 (four) times a week.   VITAMIN C PO Take 1 tablet by mouth 4 (  four) times a week.               Durable Medical Equipment  (From admission, onward)           Start     Ordered   06/09/21 1047  For home use only DME Vest life vest  Once       Comments: Length of Need: 3 months   06/09/21 1047              Discharge Care Instructions  (From admission, onward)           Start     Ordered   06/12/21 0000  Discharge wound care:       Comments: Cleanse left forearm with Ns  and pat dry.  Apply Xeroform gauze to area. Cover with kerlix and tape.  Change daily.   06/12/21 1007           No Known Allergies  Follow-up Information     Shirley Friar, PA-C Follow up on 07/22/2021.   Specialty: Physician Assistant Why: at 9:45am for your follow up appt Contact information: [place] [address] [phone]         Scheeler, Carola Rhine, PA-C. Schedule an appointment as soon as possible for a visit.   Specialty: Plastic Surgery Why: When discharged from rehab for hospital follow up of your wounds Contact information: [place] [address] [phone]                  The results of significant diagnostics from this hospitalization (including imaging, microbiology, ancillary and laboratory) are listed below for reference.    Significant Diagnostic Studies: CT HEAD WO CONTRAST  Result Date: 06/03/2021 CLINICAL DATA:  Anoxic brain injury EXAM: CT HEAD WITHOUT CONTRAST TECHNIQUE: Contiguous axial images were obtained from the base of the skull through the vertex without intravenous contrast. COMPARISON:  None. FINDINGS: Brain: There is no mass, hemorrhage or extra-axial collection. The size and configuration of the ventricles and extra-axial CSF spaces are normal. The brain parenchyma is normal, without acute or chronic infarction. Vascular: No abnormal hyperdensity of the major intracranial arteries or dural venous sinuses. No intracranial atherosclerosis. Skull: The visualized skull base, calvarium and extracranial soft tissues are normal. Sinuses/Orbits: No fluid levels or advanced mucosal thickening of the visualized paranasal sinuses. No mastoid or middle ear effusion. The orbits are normal. IMPRESSION: Normal head CT. Electronically Signed   By: Ulyses Jarred M.D.   On: 06/03/2021 22:42   CARDIAC CATHETERIZATION  Result Date: 05/31/2021  Prox RCA to Mid RCA lesion is 99% stenosed.   Dist RCA lesion is 80% stenosed.  Ramus lesion is 100% stenosed.  Origin to Prox Graft lesion is 100% stenosed.  Origin lesion is 100% stenosed.  Prox LAD lesion is 70% stenosed.  Prox LAD to Mid LAD lesion is 70% stenosed.  Mid LAD-1 lesion is 90% stenosed.  Mid LAD-2 lesion is 80% stenosed.  VF cardiac arrest with prolonged resuscitative effort and ultimate restoration of paced rhythm following multiple shocks, amiodarone, and Levophed infusion. Severe native CAD with diffuse severe proximal LAD stenoses of 70 to 80% with focal 90% stenosis between the first and second septal perforating artery with total occlusion of the first diagonal vessel and a flush and fill phenomena of the mid LAD secondary to competitive filling via the LIMA graft. Old occlusion of the ramus immediate vessel and obtuse marginal vessel Dominant  RCA with previously noted  diffuse 90% mid stenosis which now has right to right collateralization and diffuse 80% distal stenosis. Patent LIMA graft supplying the mid LAD. Patent SVG supplying the first diagonal vessel. Old occlusion of the vein graft to a ramus intermediate vessel. Old occlusion of the vein graft which had supplied the distal RCA. LVEDP 20 mmHg RECOMMENDATION: Patient is admitted to the critical care service.  We will plan 2D echo Doppler study in a.m.  He has been seen by Dr. Rayann Heman in the emergency room during his prolonged resuscitative efforts today.  Guideline directed medical therapy for CHF.  Anticipate ICD implantation per electrophysiology.   DG CHEST PORT 1 VIEW  Result Date: 06/01/2021 CLINICAL DATA:  PICC line placement EXAM: PORTABLE CHEST 1 VIEW COMPARISON:  One day prior FINDINGS: Endotracheal tube terminates 4.8 cm above carina. Nasogastric tube extends beyond the inferior aspect of the film. Single lead pacer. Right PICC line has been placed in the interval. Terminates at either the superior caval/atrial junction or high right atrium. Midline trachea.  Borderline cardiomegaly, accentuated by AP portable technique. No pleural effusion or pneumothorax. No congestive failure. Clear lungs. IMPRESSION: Right PICC line terminating at the superior caval/atrial junction or high right atrium. No pneumothorax or other acute complication. Electronically Signed   By: Abigail Miyamoto M.D.   On: 06/01/2021 07:33   ECHOCARDIOGRAM COMPLETE  Result Date: 06/01/2021    ECHOCARDIOGRAM REPORT   Patient Name:   Ross [name] Date of Exam: 06/01/2021 Medical Rec #:  [id number]      Height:       73.5 in Accession #:    [id number]     Weight:       241.4 lb Date of Birth:  1956/06/16      BSA:          2.343 m Patient Age:    22 years       BP:           101/61 mmHg Patient Gender: M              HR:           61 bpm. Exam Location:  Inpatient Procedure: 2D Echo, Cardiac Doppler, Color Doppler and Intracardiac            Opacification Agent Indications:    Cardiac arrest  History:        Patient has prior history of Echocardiogram examinations, most                 recent 02/27/2021. CHF, CAD, Pacemaker and Prior CABG, COPD,                 Arrythmias:Atrial Fibrillation; Risk Factors:Family History of                 Coronary Artery Disease, Hypertension and Dyslipidemia. Ischemic                 cardiomyopathy.  Sonographer:    Clayton Lefort RDCS (AE) Referring Phys: [place] Comments: Technically difficult study due to poor echo windows and patient is morbidly obese. Patient in Fowler's position. IMPRESSIONS  1. Left ventricular ejection fraction, by estimation, is 35 to 40%. The left ventricle has moderately decreased function. The left ventricle has no regional wall motion abnormalities. There is severe concentric left ventricular hypertrophy. Left ventricular diastolic parameters are indeterminate. Elevated left ventricular end-diastolic pressure. There is akinesis of the left ventricular, mid  anteroseptal wall. There is akinesis of the left ventricular, apical  septal wall, inferior wall and anterior wall. There is akinesis of the left ventricular, apical segment.  2. Right ventricular systolic function was not well visualized. The right ventricular size is mildly enlarged. Tricuspid regurgitation signal is inadequate for assessing PA pressure.  3. Right atrial size was severely dilated.  4. The mitral valve is normal in structure. Trivial mitral valve regurgitation. No evidence of mitral stenosis.  5. The aortic valve is tricuspid. Aortic valve regurgitation is not visualized. Mild aortic valve sclerosis is present, with no evidence of aortic valve stenosis.  6. Aortic dilatation noted. There is mild dilatation of the aortic root, measuring 41 mm.  7. The inferior vena cava is dilated in size with >50% respiratory variability, suggesting right atrial pressure of 8 mmHg. FINDINGS  Left Ventricle: Left ventricular ejection fraction, by estimation, is 35 to 40%. The left ventricle has moderately decreased function. The left ventricle has no regional wall motion abnormalities. Definity contrast agent was given IV to delineate the left ventricular endocardial borders. The left ventricular internal cavity size was normal in size. There is severe concentric left ventricular hypertrophy. Abnormal (paradoxical) septal motion, consistent with left bundle branch block. Left ventricular diastolic parameters are indeterminate. Elevated left ventricular end-diastolic pressure. Right Ventricle: The right ventricular size is mildly enlarged. Right vetricular wall thickness was not assessed. Right ventricular systolic function was not well visualized. Tricuspid regurgitation signal is inadequate for assessing PA pressure. Left Atrium: Left atrial size was normal in size. Right Atrium: Right atrial size was severely dilated. Pericardium: There is no evidence of pericardial effusion. Mitral Valve: The mitral valve is normal in structure. Trivial mitral valve regurgitation. No evidence of  mitral valve stenosis. MV peak gradient, 1.8 mmHg. The mean mitral valve gradient is 1.0 mmHg. Tricuspid Valve: The tricuspid valve is normal in structure. Tricuspid valve regurgitation is trivial. No evidence of tricuspid stenosis. Aortic Valve: The aortic valve is tricuspid. Aortic valve regurgitation is not visualized. Mild aortic valve sclerosis is present, with no evidence of aortic valve stenosis. Aortic valve mean gradient measures 3.0 mmHg. Aortic valve peak gradient measures 4.0 mmHg. Aortic valve area, by VTI measures 2.95 cm. Pulmonic Valve: The pulmonic valve was normal in structure. Pulmonic valve regurgitation is not visualized. No evidence of pulmonic stenosis. Aorta: Aortic dilatation noted. There is mild dilatation of the aortic root, measuring 41 mm. Venous: The inferior vena cava is dilated in size with greater than 50% respiratory variability, suggesting right atrial pressure of 8 mmHg. IAS/Shunts: No atrial level shunt detected by color flow Doppler.  LEFT VENTRICLE PLAX 2D LVIDd:         4.80 cm  Diastology LVIDs:         4.40 cm  LV e' medial:    3.69 cm/s LV PW:         1.60 cm  LV E/e' medial:  22.0 LV IVS:        1.70 cm  LV e' lateral:   7.50 cm/s LVOT diam:     2.40 cm  LV E/e' lateral: 10.8 LV SV:         49 LV SV Index:   21 LVOT Area:     4.52 cm  RIGHT VENTRICLE          IVC RV Basal diam:  4.10 cm  IVC diam: 2.50 cm TAPSE (M-mode): 1.5 cm LEFT ATRIUM  Index       RIGHT ATRIUM           Index LA diam:        4.50 cm 1.92 cm/m  RA Area:     30.30 cm LA Vol (A2C):   88.2 ml 37.65 ml/m RA Volume:   116.00 ml 49.52 ml/m LA Vol (A4C):   63.3 ml 27.02 ml/m LA Biplane Vol: 74.8 ml 31.93 ml/m  AORTIC VALVE AV Area (Vmax):    3.05 cm AV Area (Vmean):   2.65 cm AV Area (VTI):     2.95 cm AV Vmax:           99.70 cm/s AV Vmean:          79.400 cm/s AV VTI:            0.167 m AV Peak Grad:      4.0 mmHg AV Mean Grad:      3.0 mmHg LVOT Vmax:         67.20 cm/s LVOT Vmean:         46.500 cm/s LVOT VTI:          0.109 m LVOT/AV VTI ratio: 0.65  AORTA Ao Root diam: 3.90 cm Ao Asc diam:  3.60 cm MITRAL VALVE MV Area (PHT): 3.72 cm    SHUNTS MV Area VTI:   1.99 cm    Systemic VTI:  0.11 m MV Peak grad:  1.8 mmHg    Systemic Diam: 2.40 cm MV Mean grad:  1.0 mmHg MV Vmax:       0.67 m/s MV Vmean:      37.5 cm/s MV Decel Time: 204 msec MV E velocity: 81.30 cm/s MV A velocity: 33.40 cm/s MV E/A ratio:  2.43 Fransico Him MD Electronically signed by Fransico Him MD Signature Date/Time: 06/01/2021/10:42:48 AM    Final    [place] EKG SITE RITE  Result Date: 06/01/2021 If Site Rite image not attached, placement could not be confirmed due to current cardiac rhythm.   Microbiology: Recent Results (from the past 240 hour(s))  Culture, Urine     Status: None   Collection Time: 06/02/21  2:31 PM   Specimen: Urine, Catheterized  Result Value Ref Range Status   Specimen Description URINE, CATHETERIZED  Final   Special Requests NONE  Final   Culture   Final    NO GROWTH Performed at [facility], [address] [place], [place] [address]    Report Status 06/03/2021 FINAL  Final  Culture, blood (routine x 2)     Status: None   Collection Time: 06/02/21  4:59 PM   Specimen: BLOOD RIGHT HAND  Result Value Ref Range Status   Specimen Description BLOOD RIGHT HAND  Final   Special Requests   Final    BOTTLES DRAWN AEROBIC ONLY Blood Culture results may not be optimal due to an inadequate volume of blood received in culture bottles   Culture   Final    NO GROWTH 5 DAYS Performed at [facility], [place] [address], [place], [place] [address]    Report Status 06/07/2021 FINAL  Final  Culture, blood (routine x 2)     Status: None   Collection Time: 06/02/21  5:08 PM   Specimen: BLOOD RIGHT HAND  Result Value Ref Range Status   Specimen Description BLOOD RIGHT HAND  Final   Special Requests   Final    BOTTLES DRAWN AEROBIC ONLY Blood Culture results may not  be optimal due to an  inadequate volume of blood received in culture bottles   Culture   Final    NO GROWTH 5 DAYS Performed at [facility], [place] [address], [place], [place] [address]    Report Status 06/07/2021 FINAL  Final  SARS CORONAVIRUS 2 (TAT 6-24 HRS) Nasopharyngeal Nasopharyngeal Swab     Status: None   Collection Time: 06/04/21  4:02 PM   Specimen: Nasopharyngeal Swab  Result Value Ref Range Status   SARS Coronavirus 2 NEGATIVE NEGATIVE Final    Comment: (NOTE) SARS-CoV-2 target nucleic acids are NOT DETECTED.  The SARS-CoV-2 RNA is generally detectable in upper and lower respiratory specimens during the acute phase of infection. Negative results do not preclude SARS-CoV-2 infection, do not rule out co-infections with other pathogens, and should not be used as the sole basis for treatment or other patient management decisions. Negative results must be combined with clinical observations, patient history, and epidemiological information. The expected result is Negative.  Fact Sheet for Patients: SugarRoll.be  Fact Sheet for Healthcare Providers: https://www.woods-mathews.com/  This test is not yet approved or cleared by the [place] FDA and  has been authorized for detection and/or diagnosis of SARS-CoV-2 by FDA under an Emergency Use Authorization (EUA). This EUA will remain  in effect (meaning this test can be used) for the duration of the COVID-19 declaration under Se ction 564(b)(1) of the Act, 21 U.S.C. section 360bbb-3(b)(1), unless the authorization is terminated or revoked sooner.  Performed at [facility], [place] [address], [place], [place] [address]      Labs: Basic Metabolic Panel: Recent Labs  Lab 06/08/21 0816 06/09/21 0908 06/10/21 0256 06/11/21 0238 06/12/21 0327  NA 136 138 137 138 138  K 3.9 3.7 3.9 4.2 4.1  CL 107 108 108 108 108  CO2 19* 21* 22 22 21*  GLUCOSE 94 130* 102* 100* 97  BUN 73* 65* 59*  53* 47*  CREATININE 4.77* 3.97* 3.51* 3.09* 2.94*  CALCIUM 9.9 9.9 10.2 10.2 10.5*   Liver Function Tests: Recent Labs  Lab 06/08/21 0816  AST 37  ALT 68*  ALKPHOS 73  BILITOT 1.1  PROT 5.4*  ALBUMIN 2.8*   No results for input(s): LIPASE, AMYLASE in the last 168 hours. No results for input(s): AMMONIA in the last 168 hours. CBC: Recent Labs  Lab 06/06/21 0156 06/07/21 0209  WBC 10.3 10.1  HGB 11.7* 11.9*  HCT 34.6* 35.9*  MCV 91.3 92.8  PLT 160 203   Cardiac Enzymes: No results for input(s): CKTOTAL, CKMB, CKMBINDEX, TROPONINI in the last 168 hours. BNP: BNP (last 3 results) No results for input(s): BNP in the last 8760 hours.  ProBNP (last 3 results) No results for input(s): PROBNP in the last 8760 hours.  CBG: Recent Labs  Lab 06/11/21 0553 06/11/21 1125 06/11/21 1702 06/11/21 2136 06/12/21 0627  GLUCAP 93 126* 107* 125* 96       Signed:  Treysean Petruzzi  [facility] 06/12/2021, 10:08 AM
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
## 2021-06-12 NOTE — Progress Notes (Signed)
PMR Admission Coordinator Pre-Admission Assessment   Patient: Ross [name] is an 65 y.o., male MRN: [id number] DOB: Sep 27, 1956 Height: 6' 1.5" (186.7 cm) Weight: 109.3 kg   Insurance Information HMO: yes    PPO:      PCP:      IPA:      80/20:      OTHER: PRIMARY: U[employer] Medicare      Policy#: [id number]      Subscriber: pt CM Name: [name]      Phone#: [phone]     Fax#: [phone] Pre-Cert#: [id number] [place] for CIR provided by [name] with updates due to fax listed above on 6/28      Employer: Benefits:  Phone #: [phone]     Name: Eff. Date: 12/21/20     Deduct: $0      Out of Pocket Max: $3600 (met $225)      Life Max: n/a CIR: $295/day for days 1-5      SNF: 20 full days Outpatient:      Co-Pay: $30/visit Home Health: 100%      Co-Pay: DME: 80%     Co-Ins: 20% Providers:  SECONDARY:       Policy#:      Phone#:   Development worker, community:       Phone#:   The Therapist, art Information Summary" for patients in Inpatient Rehabilitation Facilities with attached "Privacy Act [place] Records" was provided and verbally reviewed with: Patient and Family   Emergency Contact Information Contact Information       Name Relation Home Work [name]     [phone]    [name]     [phone]           Current Medical History  Patient Admitting Diagnosis: anoxic BI 2/2 cardiac arrest with prolonged CPR (>90 minutes)   History of Present Illness: Pt is a 65 y/o male with PMH of AF, 2nd degree AVB with PPM, sCHF with EF 25%, COPD, CAD with CABG in 2007, HTN, and DM who presented to [facility] on 05/31/2021 with witnessed cardiac arrest.  Pt underwent >90 minutes of CPR with 10 defibrillations, and amio 450.  Pt was intubated on arrival and underwent L heart cath once ROSC achieved, requiring pressor support.   He was extubated on 6/12.  Creatinine continued to rise and nephrology was consulted for input.  Recommend conservative  management and follow for possible dialysis.  Creatinine peaked at 6.8 and then began to trend downward.  Pt making urine.  No need for dialysis and nephrology signed off.  Cardiology following and recommended life vest at discharge until recovered enough to consider more permanent device implantation.  Plastic surgery consulted for skin necrosis due to pressors and recommend conservative management with frequent dressing changes.  Therapy evaluations were completed and pt was recommended for CIR due to BI.     Patient's [facility] record from [facility] has been reviewed by the rehabilitation admission coordinator and physician.   Past Medical History      Past Medical History:  Diagnosis Date   Anxiety     Arthritis      "all over my body"   Atrial fibrillation ([place])      rate control strategy; LA large   Cardiac arrest ([place]) 05/31/2021   Carpal tunnel syndrome     Cerebral aneurysm dx'd 6/46/8032   Complication of anesthesia      "I' was told that I'm a very high  risk to be put to sleep; I may not come out of it" (01/16/2016)   COPD (chronic obstructive pulmonary disease) (HCC)     Coronary artery disease      a. s/p CABG;  b. Myoview (7/13):  apical anterior ischemia; c. LHC (08/2012): dLM 60%, mid LAD 90%, pCFX 40%, OM1 occluded, RCA 80-90%, distal RCA 60-70%, SVG-Dx patent, SVG-OM1 patent, LIMA-LAD patent, SVG-PDA occluded, EF 55%. => RCA dsz too long and would req multiple stents; pt refused CABG => med Rx.   Depression     DJD (degenerative joint disease)     Dyslipidemia     Dysrhythmia     Hx of echocardiogram      a. Echocardiogram (05/2013): Mild LVH, EF 54.8%, moderate LAE, mild aortic root dilatation, trace pulmonic regurgitation   Hypertension     Obesity     Peripheral neuropathy     Presence of permanent cardiac pacemaker     Type II diabetes mellitus ([place])      "haven't taken RX for 7-8 years" (01/16/2016)   Ventricular fibrillation ([place]) 05/31/2021      Family History    family history includes Alcohol abuse (age of onset: 63) in his father; Asthma in his maternal grandmother and mother; Colon cancer in his paternal grandfather; Colon polyps (age of onset: 59) in his mother; Drug abuse (age of onset: 52) in his sister; Heart Problems in his maternal grandfather and paternal grandmother; Other in his sister; Other (age of onset: 24) in his brother.   Prior Rehab/Hospitalizations Has the patient had prior rehab or hospitalizations prior to admission? No   Has the patient had major surgery during 100 days prior to admission? Yes              Current Medications   Current Facility-Administered Medications:   0.9 %  sodium chloride infusion, 250 mL, Intravenous, PRN, Troy Sine, MD   0.9 %  sodium chloride infusion, 250 mL, Intravenous, Continuous, Stretch, Marily Lente, MD   acetaminophen (TYLENOL) tablet 650 mg, 650 mg, Oral, Q4H PRN, Troy Sine, MD   ALPRAZolam Duanne Moron) tablet 0.25 mg, 0.25 mg, Oral, TID PRN, Icard, Bradley L, DO, 0.25 mg at 06/11/21 2046   amiodarone (PACERONE) tablet 400 mg, 400 mg, Oral, BID, Lorretta Harp, MD, 400 mg at 06/12/21 5916   apixaban (ELIQUIS) tablet 5 mg, 5 mg, Oral, BID, Lorretta Harp, MD, 5 mg at 06/12/21 3846   aspirin chewable tablet 81 mg, 81 mg, Oral, Daily, Troy Sine, MD, 81 mg at 06/12/21 6599   atorvastatin (LIPITOR) tablet 80 mg, 80 mg, Oral, Daily, Troy Sine, MD, 80 mg at 06/12/21 3570   camphor-menthol (SARNA) lotion, , Topical, PRN, Cristal Ford, DO   carvedilol (COREG) tablet 6.25 mg, 6.25 mg, Oral, BID WC, Hochrein, Jeneen Rinks, MD, 6.25 mg at 06/12/21 0919   guaiFENesin (ROBITUSSIN) 100 MG/5ML solution 100 mg, 5 mL, Oral, Q4H PRN, Shahmehdi, Seyed A, MD, 100 mg at 06/06/21 0835   insulin aspart (novoLOG) injection 0-9 Units, 0-9 Units, Subcutaneous, TID WC, Danford, Suann Larry, MD, 1 Units at 06/09/21 1739   ipratropium-albuterol (DUONEB) 0.5-2.5 (3) MG/3ML nebulizer solution 3 mL, 3 mL,  Nebulization, Q6H PRN, Stretch, Marily Lente, MD, 3 mL at 06/01/21 2259   isosorbide-hydrALAZINE (BIDIL) 20-37.5 MG per tablet 1.5 tablet, 1.5 tablet, Oral, TID, Dwyane Dee, MD, 1.5 tablet at 06/12/21 0919   MEDLINE mouth rinse, 15 mL, Mouth Rinse, BID, Wouk, Ailene Rud, MD, 15  mL at 06/11/21 0903   melatonin tablet 3 mg, 3 mg, Oral, QHS PRN, Opyd, Ilene Qua, MD   ondansetron (ZOFRAN) injection 4 mg, 4 mg, Intravenous, Q6H PRN, Troy Sine, MD, 4 mg at 05/31/21 2320   oxyCODONE-acetaminophen (PERCOCET/ROXICET) 5-325 MG per tablet 1-2 tablet, 1-2 tablet, Oral, Q3H PRN, Icard, Bradley L, DO, 2 tablet at 06/12/21 0930   polyethylene glycol (MIRALAX / GLYCOLAX) packet 17 g, 17 g, Oral, Daily, Alma Friendly, MD, 17 g at 06/12/21 0919   senna-docusate (Senokot-S) tablet 1 tablet, 1 tablet, Oral, BID, Alma Friendly, MD, 1 tablet at 06/12/21 0919   simethicone (MYLICON) chewable tablet 80 mg, 80 mg, Oral, QID PRN, Opyd, Ilene Qua, MD, 80 mg at 06/09/21 0427   sodium chloride (OCEAN) 0.65 % nasal spray 1 spray, 1 spray, Each Nare, PRN, Mikhail, Maryann, DO   sodium chloride flush (NS) 0.9 % injection 10-40 mL, 10-40 mL, Intracatheter, Q12H, Allred, James, MD, 10 mL at 06/12/21 0924   sodium chloride flush (NS) 0.9 % injection 10-40 mL, 10-40 mL, Intracatheter, PRN, Allred, James, MD, 10 mL at 06/03/21 2254   sodium chloride flush (NS) 0.9 % injection 3 mL, 3 mL, Intravenous, Q12H, Troy Sine, MD, 3 mL at 06/12/21 0925   sodium chloride flush (NS) 0.9 % injection 3 mL, 3 mL, Intravenous, PRN, Troy Sine, MD, 3 mL at 06/11/21 2048   Patients Current Diet:  Diet Order                  Diet - low sodium heart healthy             Diet heart healthy/carb modified Room service appropriate? Yes; Fluid consistency: Thin  Diet effective now                         Precautions / Restrictions Precautions Precautions: Fall Precaution Comments: monitor BP/orthostatic symptoms,  life vest Restrictions Weight Bearing Restrictions: No    Has the patient had 2 or more falls or a fall with injury in the past year? Yes   Prior Activity Level Community (5-7x/wk): was independent prior to admission, no DME used at baseline, still driving and going out in the community without assistance   Prior Functional Level Self Care: Did the patient need help bathing, dressing, using the toilet or eating? Independent   Indoor Mobility: Did the patient need assistance with walking from room to room (with or without device)? Independent   Stairs: Did the patient need assistance with internal or external stairs (with or without device)? Independent   Functional Cognition: Did the patient need help planning regular tasks such as shopping or remembering to take medications? Independent   Home Assistive Devices / Equipment Home Assistive Devices/Equipment: None Home Equipment: None   Prior Device Use: Indicate devices/aids used by the patient prior to current illness, exacerbation or injury? None of the above   Current Functional Level Cognition   Arousal/Alertness: Awake/alert Overall Cognitive Status: Impaired/Different from baseline Current Attention Level: Sustained Orientation Level: Oriented X4 Following Commands: Follows one step commands with increased time Safety/Judgement: Decreased awareness of safety, Decreased awareness of deficits General Comments: pt states "my left leg is weak and sore, I don't even know why" even though he has necrotic wounds being addressed by plastics. Pt limited by anxiety, comes off as short and irritable. Requires step-by-step cues for functional tasks Attention: Alternating Alternating Attention: Appears intact Memory: Impaired Memory Impairment:  Retrieval deficit, Prospective memory Awareness: Appears intact Problem Solving: Appears intact Behaviors: Poor frustration tolerance Safety/Judgment: Appears intact    Extremity  Assessment (includes Sensation/Coordination)   Upper Extremity Assessment: Generalized weakness, LUE deficits/detail LUE Deficits / Details: L UE edema, forearm wrapped LUE Coordination: decreased fine motor, decreased gross motor  Lower Extremity Assessment: Defer to PT evaluation     ADLs   Overall ADL's : Needs assistance/impaired Grooming: Supervision/safety, Standing Grooming Details (indicate cue type and reason): leaning on sink to trim mustache Upper Body Bathing: Maximal assistance, Sitting Upper Body Bathing Details (indicate cue type and reason): to wash pts hair, pt unable to use LUE to wash L side of head d/t wounds on LUE Lower Body Bathing: Maximal assistance, +2 for physical assistance, +2 for safety/equipment, Sit to/from stand Upper Body Dressing : Moderate assistance, Sitting Lower Body Dressing: Maximal assistance, Sit to/from stand Lower Body Dressing Details (indicate cue type and reason): to don socks Toilet Transfer: Minimal assistance, Ambulation Toilet Transfer Details (indicate cue type and reason): simulated via functional mobility Functional mobility during ADLs: Minimal assistance, +2 for physical assistance, +2 for safety/equipment, Rolling walker General ADL Comments: pt continues to present with impaired activity tolerance, increased pain, and generalized deconditioning     Mobility   Overal bed mobility: Needs Assistance Bed Mobility: Rolling, Sidelying to Sit Rolling: Min guard Sidelying to sit: Min assist (hand hold to pull into sitting, pt unable to elevate with use of bed rail) Supine to sit: Mod assist, +2 for physical assistance Sit to supine: Mod assist, +2 for physical assistance General bed mobility comments: Pt recieved sitting EOB     Transfers   Overall transfer level: Needs assistance Equipment used: Rolling walker (2 wheeled) Transfers: Sit to/from Stand Sit to Stand: Min assist, From elevated surface Squat pivot transfers: Min  assist General transfer comment: Pt requires min A to stand and steady prior to mobilizing     Ambulation / Gait / Stairs / Wheelchair Mobility   Ambulation/Gait Ambulation/Gait assistance: Min guard Gait Distance (Feet): 25 Feet (additional trials of 8 and 15') Assistive device: Rolling walker (2 wheeled) Gait Pattern/deviations: Step-through pattern General Gait Details: pt with slowed step-through gait, reduced stride length. Assistance for safety Gait velocity: reduced Gait velocity interpretation: <1.8 ft/sec, indicate of risk for recurrent falls     Posture / Balance Dynamic Sitting Balance Sitting balance - Comments: Leaning forward on table Balance Overall balance assessment: Needs assistance Sitting-balance support: No upper extremity supported, Feet supported Sitting balance-Leahy Scale: Fair Sitting balance - Comments: Leaning forward on table Standing balance support: Bilateral upper extremity supported Standing balance-Leahy Scale: Poor Standing balance comment: reliant on AD or external support.     Special needs/care consideration Life Vest to be ordered prior to admit to CIR and Diabetic management yes    Previous Home Environment (from acute therapy documentation) Living Arrangements: Alone Available Help at Discharge: Family Type of Home: House Home Layout: One level Home Access: Stairs to enter Technical brewer of Steps: 4 Bathroom Shower/Tub: Chiropodist: [place]: No   Discharge Living Setting Plans for Discharge Living Setting: Other (Comment) (discharge home with daughter) Type of Home at Discharge: House Discharge Home Layout: One level Discharge Home Access: Level entry Discharge Bathroom Shower/Tub: Walk-in shower, [place] unit Discharge Bathroom Toilet: Standard Discharge Bathroom Accessibility: Yes How Accessible: Accessible via walker Does the patient have any problems obtaining your  medications?: No   Social/Family/Support Systems Patient Roles: Associate Professor  Information: Juliann Mule (significant other) Anticipated Caregiver: daughter Valeda Malm Anticipated Caregiver's Contact Information: [phone] Ability/Limitations of Caregiver: n/a Caregiver Availability: 24/7 Discharge Plan Discussed with Primary Caregiver: Yes Is Caregiver In Agreement with Plan?: Yes Does Caregiver/Family have Issues with Lodging/Transportation while Pt is in Rehab?: No   Goals Patient/Family Goal for Rehab: PT/OT supervision to mod I, SLP supervision Expected length of stay: 14-18 days Additional Information: Pt can discharge home to his daughters house if he needs more assistance, or to his singificant other's home if he is doing well enough. Pt/Family Agrees to Admission and willing to participate: Yes Program Orientation Provided & Reviewed with Pt/Caregiver Including Roles  & Responsibilities: Yes   Decrease burden of Care through IP rehab admission: n/a   Possible need for SNF placement upon discharge: Not anticipated.  Pt with excellent family support from daughter and significant other.   Patient Condition: I have reviewed medical records from [facility], spoken with CM, and patient and daughter. I met with patient at the bedside for inpatient rehabilitation assessment.  Patient will benefit from ongoing PT, OT, and SLP, can actively participate in 3 hours of therapy a day 5 days of the week, and can make measurable gains during the admission.  Patient will also benefit from the coordinated team approach during an Inpatient Acute Rehabilitation admission.  The patient will receive intensive therapy as well as Rehabilitation physician, nursing, social worker, and care management interventions.  Due to safety, skin/wound care, disease management, medication administration, pain management, and patient education the patient requires 24 hour a day rehabilitation nursing.  The patient  is currently min to mod with mobility and basic ADLs.  Discharge setting and therapy post discharge at home with home health is anticipated.  Patient has agreed to participate in the Acute Inpatient Rehabilitation Program and will admit today.   Preadmission Screen Completed By:  Michel Santee, PT, DPT 06/12/2021 10:48 AM ______________________________________________________________________   Discussed status with Dr. Dagoberto Ligas on 06/12/21  at .now  and received approval for admission today.   Admission Coordinator:  Michel Santee, PT, DPT time 10:48 AM Sudie Grumbling 06/12/21    Assessment/Plan: Diagnosis: Does the need for close, 24 hr/day Medical supervision in concert with the patient's rehab needs make it unreasonable for this patient to be served in a less intensive setting? Yes Co-Morbidities requiring supervision/potential complications: aNOXIC BRAIN INJURY      , chf ef 25%, copd, CARDIAC ARREST, SKIN NECROSIS/pressors Due to bladder management, bowel management, safety, skin/wound care, disease management, medication administration, pain management, and patient education, does the patient require 24 hr/day rehab nursing? Yes Does the patient require coordinated care of a physician, rehab nurse, PT, OT, and SLP to address physical and functional deficits in the context of the above medical diagnosis(es)? Yes Addressing deficits in the following areas: balance, endurance, locomotion, strength, transferring, bowel/bladder control, bathing, dressing, feeding, grooming, toileting, cognition, language, swallowing, and psychosocial support Can the patient actively participate in an intensive therapy program of at least 3 hrs of therapy 5 days a week? Yes The potential for patient to make measurable gains while on inpatient rehab is good and fair Anticipated functional outcomes upon discharge from inpatient rehab: modified independent and supervision PT, modified independent and supervision OT,  supervision SLP Estimated rehab length of stay to reach the above functional goals is: 14-18 days Anticipated discharge destination: Home 10. Overall Rehab/Functional Prognosis: good and fair     MD Signature:

## 2021-06-12 NOTE — Progress Notes (Signed)
Inpatient Rehabilitation Medication Review by a Pharmacist  A complete drug regimen review was completed for this patient to identify any potential clinically significant medication issues.  Clinically significant medication issues were identified:  yes   Type of Medication Issue Identified Description of Issue Urgent (address now) Non-Urgent (address on AM team rounds) Plan Plan Accepted by Provider? (Yes / No / Pending AM Rounds)  Drug Interaction(s) (clinically significant)       Duplicate Therapy       Allergy       No Medication Administration End Date       Incorrect Dose       Additional Drug Therapy Needed  Spiriva - not continued on admission Non-urgent Continue Spiriva   Other         Name of provider notified for urgent issues identified:   Provider Method of Notification:    For non-urgent medication issues to be resolved on team rounds tomorrow morning a [facility] Secure Chat Handoff was sent to:  Joselyn Glassman  Pharmacist comments:   Time spent performing this drug regimen review (minutes):  5 minutes   Ross [name] 06/12/2021 9:50 PM

## 2021-06-12 NOTE — Progress Notes (Signed)
Inpatient Rehab Admissions Coordinator:    I have insurance approval and a bed available for pt to admit to CIR today. Dr. Ree Kida in agreement.  Will let pt/family and TOC team know.   Shann Medal, PT, DPT Admissions Coordinator [phone] 06/12/21  10:46 AM

## 2021-06-12 NOTE — Progress Notes (Signed)
Pt wanted ice cream writer walked in room with graham cracker and skim milk. Pt was eating chocolate ice cream. Pt was educated to the effect on the circulatory system when blood sugars are not well controlled.Pt states he was never on insulin and he is not diabetic. States he has an ice cream every night. Pt took ice cream away so writer wouldn't remove it. I assured him that now that he had it I would not force it from him. Pt will need  continuing education regarding diabetes management. Will put in diabetic consult.

## 2021-06-12 NOTE — Progress Notes (Addendum)
Met with patient and his significant other, reviewed the life vest- battery was dead replaced with charged battery. Reviewed life vest and importance of battery remaining charged and on at all times. Pt voiced understanding that the vest needs the battery power. Pt and significant other were educated to the life vest. Pt and significant other stated they were told by the nurse who applied the vest "that it only delivers one shock and that's all after that   you mine as well take it off for the good it's doing." Writer explained that most life vests will deliver up to five shocks if it senses an abnormally fast heart rate. I told the patient that he should speak to Dr. Dagoberto Ligas in the morning with any questions regarding the life vest. Pt and significant other were receptive however pt demonstates some degree of forgetfulness and some cognitive deficits. Will need to reinforce education.

## 2021-06-12 NOTE — TOC Transition Note (Signed)
Transition of Care [facility]) - CM/SW Discharge Note   Patient Details  Name: Ross [name] MRN: [id number] Date of Birth: 07-21-1956  [facility] of [facility] CM/SW Contact:  Zenon Mayo, RN Phone Number: 06/12/2021, 9:33 AM   Clinical Narrative:    Per Caitlen with CIR states she has received auth for CIR for patient, he has been fitted already with the life vest with [employer].     Final next level of care: IP Rehab Facility Barriers to Discharge: No Barriers Identified   Patient Goals and CMS Choice Patient states their goals for this hospitalization and ongoing recovery are:: IP rehab      Discharge Placement                       Discharge Plan and Services                DME Arranged: Life vest DME Agency: [employer] Date DME Agency Contacted: 06/06/21 Time DME Agency Contacted: 1000 Representative spoke with at DME Agency: [facility] Arranged: NA          Social Determinants of Health ([place]) Interventions     Readmission Risk Interventions No flowsheet data found.

## 2021-06-12 NOTE — Progress Notes (Signed)
INPATIENT REHABILITATION ADMISSION NOTE   Arrival Method: Wheelchair    Notes: Patient arrived to floor by wheelchair with RN and tech at 3:00pm. Patient A/O x4. Patient oriented to rehab floor and what to expect during stay.   Lupita Raider LPN

## 2021-06-13 LAB — GLUCOSE, CAPILLARY
Glucose-Capillary: 83 mg/dL (ref 70–99)
Glucose-Capillary: 93 mg/dL (ref 70–99)
Glucose-Capillary: 152 mg/dL — ABNORMAL HIGH (ref 70–99)
Glucose-Capillary: 109 mg/dL — ABNORMAL HIGH (ref 70–99)

## 2021-06-13 LAB — COMPREHENSIVE METABOLIC PANEL
ALT: 35 U/L (ref 0–44)
AST: 19 U/L (ref 15–41)
Albumin: 2.9 g/dL — ABNORMAL LOW (ref 3.5–5.0)
Alkaline Phosphatase: 74 U/L (ref 38–126)
Anion gap: 6 (ref 5–15)
BUN: 42 mg/dL — ABNORMAL HIGH (ref 8–23)
CO2: 20 mmol/L — ABNORMAL LOW (ref 22–32)
Calcium: 10.1 mg/dL (ref 8.9–10.3)
Chloride: 110 mmol/L (ref 98–111)
Creatinine, Ser: 2.62 mg/dL — ABNORMAL HIGH (ref 0.61–1.24)
GFR, Estimated: 26 mL/min — ABNORMAL LOW (ref >60)
Glucose, Bld: 102 mg/dL — ABNORMAL HIGH (ref 70–99)
Potassium: 4.4 mmol/L (ref 3.5–5.1)
Sodium: 136 mmol/L (ref 135–145)
Total Bilirubin: 0.5 mg/dL (ref 0.3–1.2)
Total Protein: 5.5 g/dL — ABNORMAL LOW (ref 6.5–8.1)

## 2021-06-13 LAB — CBC WITH DIFFERENTIAL/PLATELET
Abs Immature Granulocytes: 0.14 10*3/uL — ABNORMAL HIGH (ref 0.00–0.07)
Basophils Absolute: 0 10*3/uL (ref 0.0–0.1)
Basophils Relative: 0 %
Eosinophils Absolute: 0.4 10*3/uL (ref 0.0–0.5)
Eosinophils Relative: 4 %
HCT: 31.7 % — ABNORMAL LOW (ref 39.0–52.0)
Hemoglobin: 10.5 g/dL — ABNORMAL LOW (ref 13.0–17.0)
Immature Granulocytes: 1 %
Lymphocytes Relative: 15 %
Lymphs Abs: 1.8 10*3/uL (ref 0.7–4.0)
MCH: 31.6 pg (ref 26.0–34.0)
MCHC: 33.1 g/dL (ref 30.0–36.0)
MCV: 95.5 fL (ref 80.0–100.0)
Monocytes Absolute: 1 10*3/uL (ref 0.1–1.0)
Monocytes Relative: 9 %
Neutro Abs: 8.2 10*3/uL — ABNORMAL HIGH (ref 1.7–7.7)
Neutrophils Relative %: 71 %
Platelets: 432 10*3/uL — ABNORMAL HIGH (ref 150–400)
RBC: 3.32 MIL/uL — ABNORMAL LOW (ref 4.22–5.81)
RDW: 13.3 % (ref 11.5–15.5)
WBC: 11.6 10*3/uL — ABNORMAL HIGH (ref 4.0–10.5)
nRBC: 0 % (ref 0.0–0.2)

## 2021-06-13 MED ORDER — SODIUM CHLORIDE 0.9% FLUSH: 3.0000 mL | Freq: Two times a day (BID) | INTRAVENOUS | Status: DC

## 2021-06-13 MED ORDER — GABAPENTIN 300 MG PO CAPS
300.0000 mg | ORAL_CAPSULE | Freq: Every day | ORAL | Status: DC
  Administered 2021-06-13 – 2021-06-26 (×14): 300 mg via ORAL
  Filled 2021-06-13 (×14): qty 1
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
## 2021-06-13 NOTE — Progress Notes (Signed)
Inpatient Rehabilitation Center Individual Statement of Services  Patient Name:  Ross [name]  Date:  06/13/2021  Welcome to the [facility].  Our goal is to provide you with an individualized program based on your diagnosis and situation, designed to meet your specific needs.  With this comprehensive rehabilitation program, you will be expected to participate in at least 3 hours of rehabilitation therapies Monday-Friday, with modified therapy programming on the weekends.  Your rehabilitation program will include the following services:  Physical Therapy (PT), Occupational Therapy (OT), Speech Therapy (ST), 24 hour per day rehabilitation nursing, Therapeutic Recreaction (TR), Neuropsychology, Care Coordinator, Rehabilitation Medicine, Nutrition Services, and Pharmacy Services  Weekly team conferences will be held on Tuesday to discuss your progress.  Your Inpatient Rehabilitation Care Coordinator will talk with you frequently to get your input and to update you on team discussions.  Team conferences with you and your family in attendance may also be held.  Expected length of stay: 14-18 days  Overall anticipated outcome: supervision with cues  Depending on your progress and recovery, your program may change. Your Inpatient Rehabilitation Care Coordinator will coordinate services and will keep you informed of any changes. Your Inpatient Rehabilitation Care Coordinator's name and contact numbers are listed  below.  The following services may also be recommended but are not provided by the [facility] will be made to provide these services after discharge if needed.  Arrangements include referral to agencies that provide these services.  Your insurance has been verified to be:  [employer]-Medicare Your primary doctor is:  Orpah Melter  Pertinent  information will be shared with your doctor and your insurance company.  Inpatient Rehabilitation Care Coordinator:  Ovidio Kin, [place] or Emilia Beck  Information discussed with and copy given to patient by: Elease Hashimoto, 06/13/2021, 10:29 AM

## 2021-06-13 NOTE — Progress Notes (Signed)
Patient medicated for pain this morning. States he slept "okay". Patient was informed that he does have trazodone also if he wants to try that tonight along with his melatonin. Dressings remain C/D/I pt resting in bed call light within reach.
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
## 2021-06-13 NOTE — Plan of Care (Signed)
  Problem: RH Balance Goal: LTG Patient will maintain dynamic standing with ADLs (OT) Description: LTG:  Patient will maintain dynamic standing balance with assist during activities of daily living (OT)  Flowsheets (Taken 06/13/2021 1548) LTG: Pt will maintain dynamic standing balance during ADLs with: Supervision/Verbal cueing   Problem: Sit to Stand Goal: LTG:  Patient will perform sit to stand in prep for activites of daily living with assistance level (OT) Description: LTG:  Patient will perform sit to stand in prep for activites of daily living with assistance level (OT) Flowsheets (Taken 06/13/2021 1548) LTG: PT will perform sit to stand in prep for activites of daily living with assistance level: Supervision/Verbal cueing   Problem: RH Bathing Goal: LTG Patient will bathe all body parts with assist levels (OT) Description: LTG: Patient will bathe all body parts with assist levels (OT) Flowsheets (Taken 06/13/2021 1548) LTG: Pt will perform bathing with assistance level/cueing: Supervision/Verbal cueing   Problem: RH Dressing Goal: LTG Patient will perform upper body dressing (OT) Description: LTG Patient will perform upper body dressing with assist, with/without cues (OT). Flowsheets (Taken 06/13/2021 1548) LTG: Pt will perform upper body dressing with assistance level of: Set up assist Goal: LTG Patient will perform lower body dressing w/assist (OT) Description: LTG: Patient will perform lower body dressing with assist, with/without cues in positioning using equipment (OT) Flowsheets (Taken 06/13/2021 1548) LTG: Pt will perform lower body dressing with assistance level of: Supervision/Verbal cueing   Problem: RH Toileting Goal: LTG Patient will perform toileting task (3/3 steps) with assistance level (OT) Description: LTG: Patient will perform toileting task (3/3 steps) with assistance level (OT)  Flowsheets (Taken 06/13/2021 1548) LTG: Pt will perform toileting task (3/3 steps)  with assistance level: Supervision/Verbal cueing   Problem: RH Toilet Transfers Goal: LTG Patient will perform toilet transfers w/assist (OT) Description: LTG: Patient will perform toilet transfers with assist, with/without cues using equipment (OT) Flowsheets (Taken 06/13/2021 1548) LTG: Pt will perform toilet transfers with assistance level of: Supervision/Verbal cueing   Problem: RH Tub/Shower Transfers Goal: LTG Patient will perform tub/shower transfers w/assist (OT) Description: LTG: Patient will perform tub/shower transfers with assist, with/without cues using equipment (OT) Flowsheets (Taken 06/13/2021 1548) LTG: Pt will perform tub/shower stall transfers with assistance level of: Supervision/Verbal cueing

## 2021-06-13 NOTE — Progress Notes (Signed)
PROGRESS NOTE   Subjective/Complaints:  Pt reports nerve pain- burning and sensitive to touch on LUE/LLE is his biggest limiting factor.  No BM yet, but taking miralax. Feels a little bloated/constipated.    No side effects from gabapentin last night- but of course hasn't helped yet.     ROS:  Pt denies SOB, abd pain, CP, N/V and vision changes    Objective:   No results found. Recent Labs    06/13/21 0502  WBC 11.6*  HGB 10.5*  HCT 31.7*  PLT 432*   Recent Labs    06/12/21 0327 06/13/21 0502  NA 138 136  K 4.1 4.4  CL 108 110  CO2 21* 20*  GLUCOSE 97 102*  BUN 47* 42*  CREATININE 2.94* 2.62*  CALCIUM 10.5* 10.1    Intake/Output Summary (Last 24 hours) at 06/13/2021 1654 Last data filed at 06/13/2021 0700 Gross per 24 hour  Intake 120 ml  Output 1350 ml  Net -1230 ml     Pressure Injury 06/12/21 Back Mid;Left Stage 2 -  Partial thickness loss of dermis presenting as a shallow open injury with a red, pink wound bed without slough. Left side mid back, stage 2, layer of skin missing, covered with foam dressing, area pink. (Active)  06/12/21 1600  Location: Back  Location Orientation: Mid;Left  Staging: Stage 2 -  Partial thickness loss of dermis presenting as a shallow open injury with a red, pink wound bed without slough.  Wound Description (Comments): Left side mid back, stage 2, layer of skin missing, covered with foam dressing, area pink.  Present on Admission: Yes    Physical Exam: Vital Signs Blood pressure (!) 102/52, pulse 60, temperature 98.1 F (36.7 C), temperature source Oral, resp. rate 20, height 6\' 4"  (1.93 m), weight 112.9 kg, SpO2 98 %.   General: awake, alert, appropriate, sitting up in bed; RN at bedside; NAD HENT: conjugate gaze; oropharynx moist CV:  borderline bradycardia; ; no JVD Pulmonary: CTA B/L; no W/R/R- good air movement GI: soft, NT, ND, (+)BS Psychiatric:  appropriate but delayed; tangential- perseverative Neurological: alert Musculoskeletal:        General: Swelling (1+ LUE and min LLE.) present.    Cervical back: Normal range of motion and neck supple.    Comments: RUE- 5/5- in all muscles checked from proximal to distal LUE_ at least 3+/5- cannot tolerate any pressure on LUE to test strength further RLE- 5/5- in all muscle tested LLE- at least 3/5 in HF, and KF/KE- DF and PF at least 4/5- appears slightly less than RLE Decreased ROM of L knee- hard to tell if muscular/skin related (due to skin necrosis/pain) or if actually has lost ROM - cannot flex L knee to 90 degrees.    Skin:    Comments: Trace LLE edema and L hand edema- not pitting Saw pics of LUE/LLE Very deep purple where blisters have denuded- with some associated necrosis. On both limbs- has kerlex dressings in place C/D/I R forearm - looks OK Skin tear L thoracic area- no skin breakdown seen on backside Missing 2 front teeth.   Neurological:    Mental Status: He is alert and oriented  to person, place, and time.    Comments: Slow and measured. Able to follow simple commands.   Delayed to respond to questions and to ask questions- significant delay noted Tremor in LUE with movement/reaching- intention tremor        Assessment/Plan: 1. Functional deficits which require 3+ hours per day of interdisciplinary therapy in a comprehensive inpatient rehab setting. Physiatrist is providing close team supervision and 24 hour management of active medical problems listed below. Physiatrist and rehab team continue to assess barriers to discharge/monitor patient progress toward functional and medical goals  Care Tool:  Bathing    Body parts bathed by patient: Right arm, Left arm, Chest, Abdomen, Front perineal area, Right upper leg, Left upper leg   Body parts bathed by helper: Buttocks, Left lower leg, Right lower leg (Lt foot, calf area n/a due to wounds)     Bathing assist  Assist Level: Moderate Assistance - Patient 50 - 74%     Upper Body Dressing/Undressing Upper body dressing   What is the patient wearing?: Pull over shirt, Orthosis (Lifevest)    Upper body assist Assist Level: Moderate Assistance - Patient 50 - 74%    Lower Body Dressing/Undressing Lower body dressing      What is the patient wearing?: Pants     Lower body assist Assist for lower body dressing: Contact Guard/Touching assist     Toileting Toileting    Toileting assist Assist for toileting: Minimal Assistance - Patient > 75% Assistive Device Comment: urinal   Transfers Chair/bed transfer  Transfers assist     Chair/bed transfer assist level: Minimal Assistance - Patient > 75%     Locomotion Ambulation   Ambulation assist      Assist level: Minimal Assistance - Patient > 75% Assistive device: Walker-rolling Max distance: 60   Walk 10 feet activity   Assist     Assist level: Minimal Assistance - Patient > 75% Assistive device: Walker-rolling   Walk 50 feet activity   Assist    Assist level: Minimal Assistance - Patient > 75% Assistive device: Walker-rolling    Walk 150 feet activity   Assist Walk 150 feet activity did not occur: Safety/medical concerns         Walk 10 feet on uneven surface  activity   Assist Walk 10 feet on uneven surfaces activity did not occur: Safety/medical concerns (could not tolerate assessment due to fatigue)         Wheelchair     Assist Will patient use wheelchair at discharge?: No             Wheelchair 50 feet with 2 turns activity    Assist            Wheelchair 150 feet activity     Assist          Blood pressure (!) 102/52, pulse 60, temperature 98.1 F (36.7 C), temperature source Oral, resp. rate 20, height 6\' 4"  (1.93 m), weight 112.9 kg, SpO2 98 %.  Medical Problem List and Plan: 1.  Anoxic brain injury secondary to cardiac arrest with CPR >90 minutes! S/P  pressors with LUE/LLE wounds             -patient may shower if covers LUE/LLE             -ELOS/Goals: 2-2.15 weeks- supervision to mod I- needs PT, OT and SLP- pt doesn't have insight into memory/cognitive issues.   -first day of evaluations- con't PT, OT and SLP  2.  Antithrombotics: -DVT/anticoagulation:  Pharmaceutical: Other (comment) --Eliquis.             -antiplatelet therapy: ASA 3. Chest wall pain/Pain Management: Oxycodone prn for chest pain- hx of taking oxy at home chronically, but stopped prior to admission due to constipation.  4. Mood: LCSW to follow for evaluation and support.              -antipsychotic agents: N/A 5. Neuropsych: This patient is? capable of making decisions on his own behalf. 6. Skin/Wound Care: local care to denuded blisters.             --continue Vitamin C. Will add protein supplements to promote wound healing.  7. Fluids/Electrolytes/Nutrition: Monitor I/O. Check lytes in am. 8. CAD/ICM: Treated medically --avoid nephrotoxic meds             --On BIDIL, coreg, ASA, Lipitor.             --monitor for symptoms with increase in activity.  9. Left forearm/left shin ulcers due to blistering/pressors/necrosis: Continue xeroform with dry dressing changes daily.  6/24- assessed this AM- look better than expected- con't wound care- will con't to monitor 10. Constipation:On Senna and miralax-->will increase miralax to bid.  6/24- no BM yet- last BM  Tuesday evening- if no BM Saturday AM, needs Mg citrate? Vs high dose sorbitol.  11. Neuropathy: Will start low dose gabapentin at nights and titrate upwards as tolerated.             --had psychosis with Lyrica.  6/24- will increase gabapentin to 300 mg QHS- if tolerates well, Sunday/Monday will increase to 300 mg TID if Cr allows 12. Hyperglycemia:  Hgb A1C- 5.6-->will monitor BS bid ac for a few days and  d/c if stable. 13. PAF: Monitor HR tid--continue amiodarone, Coreg and Eliquis 14. VT/VF: LifeVest in  place--will need continued education on management.             --Amiodarone 400 mg bid-->100 mg on 06/29  6/24- advised pt not to remove Life Vest- wrote order to do same 15. AKI: Resolving. Continue to avoid nephrotoxic drugs and monitor for recovery. 16. Constipation: Senna increased to BID.  17. COPD: Stable without MDI at this time.             --monitor for symptoms with increase in activity. 18. sCHF- with EF of 25%- will monitor daily weights and monitor       LOS: 1 days A FACE TO FACE EVALUATION WAS PERFORMED  Leanne Sisler 06/13/2021, 4:54 PM

## 2021-06-13 NOTE — Progress Notes (Signed)
Inpatient Rehabilitation  Patient information reviewed and entered into eRehab system by Delvin Hedeen M. Grantley Savage, M.A., CCC/SLP, PPS Coordinator.  Information including medical coding, functional ability and quality indicators will be reviewed and updated through discharge.    

## 2021-06-14 DIAGNOSIS — L8990 Pressure ulcer of unspecified site, unspecified stage: Secondary | ICD-10-CM | POA: Insufficient documentation

## 2021-06-14 LAB — GLUCOSE, CAPILLARY
Glucose-Capillary: 87 mg/dL (ref 70–99)
Glucose-Capillary: 136 mg/dL — ABNORMAL HIGH (ref 70–99)
Glucose-Capillary: 152 mg/dL — ABNORMAL HIGH (ref 70–99)

## 2021-06-14 NOTE — Progress Notes (Signed)
Patient slept good. PRN xanax and scheduled neurontin given at 2138. Declined melatonin and/or trazodone, reports taking xanax at bedtime for many years. Feels neurontin helping with numbness/tingling pain to feet. Patient adamant that life vest battery be changed every 24 hours at 0800, note placed at [facility]. Kerlix dressings to LUE and LLE. Foam dressing to left, middle back and foam to right lateral foot. Edema to LUE, non-pitting and LLE-1+ pitting. Assisted with urinal X 1, spilled X 1-changed bedpad. Ross [name] A

## 2021-06-14 NOTE — Progress Notes (Signed)
PROGRESS NOTE   Subjective/Complaints: Does not like being stuck for CBGs 4 times per day. CBGs reviewed and well controlled- discussed decreasing to twice per day and he is agreeable Wants to make sure he will be able to climb 14 stairs before he is released home   ROS:  Pt denies SOB, abd pain, CP, N/V and vision changes    Objective:   No results found. Recent Labs    06/13/21 0502  WBC 11.6*  HGB 10.5*  HCT 31.7*  PLT 432*   Recent Labs    06/12/21 0327 06/13/21 0502  NA 138 136  K 4.1 4.4  CL 108 110  CO2 21* 20*  GLUCOSE 97 102*  BUN 47* 42*  CREATININE 2.94* 2.62*  CALCIUM 10.5* 10.1    Intake/Output Summary (Last 24 hours) at 06/14/2021 1425 Last data filed at 06/14/2021 1300 Gross per 24 hour  Intake 1240 ml  Output 1300 ml  Net -60 ml     Pressure Injury 06/12/21 Back Mid;Left Stage 2 -  Partial thickness loss of dermis presenting as a shallow open injury with a red, pink wound bed without slough. Left side mid back, stage 2, layer of skin missing, covered with foam dressing, area pink. (Active)  06/12/21 1600  Location: Back  Location Orientation: Mid;Left  Staging: Stage 2 -  Partial thickness loss of dermis presenting as a shallow open injury with a red, pink wound bed without slough.  Wound Description (Comments): Left side mid back, stage 2, layer of skin missing, covered with foam dressing, area pink.  Present on Admission: Yes    Physical Exam: Vital Signs Blood pressure (!) 111/49, pulse 70, temperature 97.8 F (36.6 C), temperature source Oral, resp. rate 17, height 6\' 4"  (1.93 m), weight 113.3 kg, SpO2 97 %.  Gen: no distress, normal appearing HEENT: oral mucosa pink and moist, NCAT Cardio: Reg rate Chest: normal effort, normal rate of breathing Abd: soft, non-distended Ext: no edema Psych: pleasant, normal affect Skin: intact Musculoskeletal:        General: Swelling (1+  LUE and min LLE.) present.    Cervical back: Normal range of motion and neck supple.    Comments: RUE- 5/5- in all muscles checked from proximal to distal LUE_ at least 3+/5- cannot tolerate any pressure on LUE to test strength further RLE- 5/5- in all muscle tested LLE- at least 3/5 in HF, and KF/KE- DF and PF at least 4/5- appears slightly less than RLE Decreased ROM of L knee- hard to tell if muscular/skin related (due to skin necrosis/pain) or if actually has lost ROM - cannot flex L knee to 90 degrees.    Skin:    Comments: Trace LLE edema and L hand edema- not pitting Saw pics of LUE/LLE Very deep purple where blisters have denuded- with some associated necrosis. On both limbs- has kerlex dressings in place C/D/I R forearm - looks OK Skin tear L thoracic area- no skin breakdown seen on backside Missing 2 front teeth.   Neurological:    Mental Status: He is alert and oriented to person, place, and time.    Comments: Slow and measured. Able to follow  simple commands.   Delayed to respond to questions and to ask questions- significant delay noted Tremor in LUE with movement/reaching- intention tremor        Assessment/Plan: 1. Functional deficits which require 3+ hours per day of interdisciplinary therapy in a comprehensive inpatient rehab setting. Physiatrist is providing close team supervision and 24 hour management of active medical problems listed below. Physiatrist and rehab team continue to assess barriers to discharge/monitor patient progress toward functional and medical goals  Care Tool:  Bathing    Body parts bathed by patient: Right arm, Left arm, Chest, Abdomen, Front perineal area, Right upper leg, Left upper leg   Body parts bathed by helper: Buttocks, Left lower leg, Right lower leg (Lt foot, calf area n/a due to wounds)     Bathing assist Assist Level: Moderate Assistance - Patient 50 - 74%     Upper Body Dressing/Undressing Upper body dressing   What  is the patient wearing?: Pull over shirt, Orthosis    Upper body assist Assist Level: Moderate Assistance - Patient 50 - 74%    Lower Body Dressing/Undressing Lower body dressing      What is the patient wearing?: Pants     Lower body assist Assist for lower body dressing: Minimal Assistance - Patient > 75%     Toileting Toileting    Toileting assist Assist for toileting: Moderate Assistance - Patient 50 - 74% Assistive Device Comment: urinal   Transfers Chair/bed transfer  Transfers assist     Chair/bed transfer assist level: Minimal Assistance - Patient > 75%     Locomotion Ambulation   Ambulation assist      Assist level: Minimal Assistance - Patient > 75% Assistive device: Walker-rolling Max distance: 60   Walk 10 feet activity   Assist     Assist level: Minimal Assistance - Patient > 75% Assistive device: Walker-rolling   Walk 50 feet activity   Assist    Assist level: Minimal Assistance - Patient > 75% Assistive device: Walker-rolling    Walk 150 feet activity   Assist Walk 150 feet activity did not occur: Safety/medical concerns         Walk 10 feet on uneven surface  activity   Assist Walk 10 feet on uneven surfaces activity did not occur: Safety/medical concerns (could not tolerate assessment due to fatigue)         Wheelchair     Assist Will patient use wheelchair at discharge?: No             Wheelchair 50 feet with 2 turns activity    Assist            Wheelchair 150 feet activity     Assist          Blood pressure (!) 111/49, pulse 70, temperature 97.8 F (36.6 C), temperature source Oral, resp. rate 17, height 6\' 4"  (1.93 m), weight 113.3 kg, SpO2 97 %.  Medical Problem List and Plan: 1.  Anoxic brain injury secondary to cardiac arrest with CPR >90 minutes! S/P pressors with LUE/LLE wounds             -patient may shower if covers LUE/LLE             -ELOS/Goals: 2-2.15 weeks-  supervision to mod I- needs PT, OT and SLP- pt doesn't have insight into memory/cognitive issues.   Continue PT, OT and SLP 2.  Antithrombotics: -DVT/anticoagulation:  Pharmaceutical: Other (comment) --Eliquis.             -  antiplatelet therapy: ASA 3. Chest wall pain/Pain Management: Continue Oxycodone prn for chest pain- hx of taking oxy at home chronically, but stopped prior to admission due to constipation.  4. Mood: LCSW to follow for evaluation and support.              -antipsychotic agents: N/A 5. Neuropsych: This patient is? capable of making decisions on his own behalf. 6. Skin/Wound Care: local care to denuded blisters.             --continue Vitamin C. Will add protein supplements to promote wound healing.  7. Fluids/Electrolytes/Nutrition: Monitor I/O. Check lytes in am. 8. CAD/ICM: Treated medically --avoid nephrotoxic meds             --On BIDIL, coreg, ASA, Lipitor.             --monitor for symptoms with increase in activity.  9. Left forearm/left shin ulcers due to blistering/pressors/necrosis: Continue xeroform with dry dressing changes daily.  6/24- assessed this AM- look better than expected- con't wound care- will con't to monitor 10. Constipation:On Senna and miralax-->will increase miralax to bid. Senna increased to BID 11. Neuropathy: Will start low dose gabapentin at nights and titrate upwards as tolerated.             --had psychosis with Lyrica.  6/24- will increase gabapentin to 300 mg QHS- if tolerates well, Sunday/Monday will increase to 300 mg TID if Cr allows 12. Hyperglycemia:  Hgb A1C- 5.6-->will monitor BS bid ac for a few days and d/c if stable. 13. PAF: Monitor HR tid--continue amiodarone, Coreg and Eliquis 14. VT/VF: LifeVest in place--will need continued education on management.             --Amiodarone 400 mg bid-->100 mg on 06/29  6/24- advised pt not to remove Life Vest- wrote order to do same 15. AKI: Resolving. Continue to avoid nephrotoxic drugs  and monitor for recovery.  16. COPD: Stable without MDI at this time.             --monitor for symptoms with increase in activity. 17. sCHF- with EF of 25%- will monitor daily weights and monitor       LOS: 2 days A FACE TO FACE EVALUATION WAS PERFORMED  Martha Clan P Bryton Romagnoli 06/14/2021, 2:25 PM

## 2021-06-14 NOTE — Progress Notes (Signed)
Pt c/o h/a feels due to high b/p, VS WNL, feels b/p too low, informed that b/p is where it is normally. Contacted on-call, will hold b/p med since it's due. Will inform pt.

## 2021-06-14 NOTE — Progress Notes (Signed)
Occupational Therapy Session Note  Patient Details  Name: Ross [name] MRN: [id number] Date of Birth: [birth year]  Today's Date: 06/14/2021 OT Individual Time: 1005-1100 OT Individual Time Calculation (min): 55 min   Short Term Goals: Week 1:  OT Short Term Goal 1 (Week 1): Pt will complete an ambulatory toilet transfer using LRAD and no more than Min A OT Short Term Goal 2 (Week 1): Pt will complete LB dressing with Min A using adaptive strategies as needed OT Short Term Goal 3 (Week 1): Pt will complete 1 grooming task while standing at the sink for more than 10 sec to increase functional standing endurance  Skilled Therapeutic Interventions/Progress Updates:    Pt greeted in the bed, requesting to shave and wash his hair during session. Ambulatory transfer to the sink completed using RW with CGA, pt self managing his LifeVest harness. He asked to complete oral care first, declined standing though he was provided with the opportunity. Setup for oral care and then pt reported he needed to use the restroom. CGA for ambulatory transfer to the toilet using RW over toilet to void bladder in standing with vcs. He returned to the w/c to shave with min cues for problem solving. Increased time for pt to remove razor cap with his Lt hand but able to meet task demands unaided. Afterwards OT assisted with hair washing using the hair washing tray which appeared to enhance affect. Worked on UB strengthening/endurance next while blow-drying hair. He returned to bed at close of session, left with all needs within reach and bed alarm set.   Therapy Documentation Precautions:  Precautions Precautions: Fall Precaution Comments: Lifevest Restrictions Weight Bearing Restrictions: No  Pain: Lt sided wound pain, per pt manageable during tx without additional interventions   ADL: ADL Eating: Not assessed Grooming: Setup Where Assessed-Grooming: Edge of bed Upper Body Bathing: Supervision/safety Where  Assessed-Upper Body Bathing: Edge of bed Lower Body Bathing: Moderate assistance Where Assessed-Lower Body Bathing: Edge of bed (perihygiene completed partially in seated using lateral leans due to pts increased feelings of anxiety with standing >10 seconds) Upper Body Dressing: Moderate assistance (shirt + LifeVest) Where Assessed-Upper Body Dressing: Edge of bed Lower Body Dressing: Moderate assistance Where Assessed-Lower Body Dressing: Edge of bed Toileting: Minimal assistance Where Assessed-Toileting: Bedside Commode Toilet Transfer: Minimal assistance Toilet Transfer Method: Stand pivot (without AD) Toilet Transfer Equipment: Bedside commode Tub/Shower Transfer: Not assessed     Therapy/Group: Individual Therapy  Corynn Solberg A Franceen Erisman 06/14/2021, 12:40 PM

## 2021-06-15 LAB — GLUCOSE, CAPILLARY: Glucose-Capillary: 99 mg/dL (ref 70–99)

## 2021-06-15 MED ORDER — CHLORHEXIDINE GLUCONATE 0.12 % MT SOLN
OROMUCOSAL | Status: AC
  Administered 2021-06-15: 15 mL
  Filled 2021-06-15: qty 15

## 2021-06-15 MED ORDER — LIDOCAINE 4 % EX CREA
TOPICAL_CREAM | Freq: Four times a day (QID) | CUTANEOUS | Status: DC | PRN
  Filled 2021-06-15: qty 5

## 2021-06-15 NOTE — Progress Notes (Signed)
Pt requesting that life vest be changed out. Said that can fiance do it. Informed that order states it is to remain in place at all times. Pt insist that needs to be changed every two/three days. Informed again that will not remove at this time.

## 2021-06-15 NOTE — Progress Notes (Signed)
Pt reports that feels funny, ask to describe and he couldn't however he then reported that he gets anxious before therapy and not sure what to do about it. Offered xanax before therapy and he reports that would help. Pt reported generalized pain 7 out of 10. Administered only one percocet due to xanax being given. Also pt may benefit from neuropsych?

## 2021-06-15 NOTE — Progress Notes (Signed)
Slept good. PRN xanax given 2225. Ross [name]

## 2021-06-15 NOTE — Progress Notes (Signed)
Physical Therapy Session Note  Patient Details  Name: Ross [name] MRN: [id number] Date of Birth: 1956/03/09  Today's Date: 06/15/2021 PT Individual Time: 1100-1155; 1300-1330 PT Individual Time Calculation (min): 55 min and 30 min  Short Term Goals: Week 1:  PT Short Term Goal 1 (Week 1): Pt will perform bed to/from wc transfers w/LRAD and cga PT Short Term Goal 2 (Week 1): Pt will perform supine to/from sit w/min to cga PT Short Term Goal 3 (Week 1): Pt will ambulate 1102ft w/LRAD and cga PT Short Term Goal 4 (Week 1): Pt will ascend/descend 8 stairs w/one rail w/min assist  Skilled Therapeutic Interventions/Progress Updates:    Session 1: Pt received seated in bed, agreeable to PT session. No complaints of pain initially. Does report onset of LUE and LLE pain with mobility. Provided ice pack to LUE at end of session to assist with edema management. Pt initially with fair tolerance for placement of ice pack due to pressure on RUE. Placed ice pack under UE and will assess results next session. Bed mobility Supervision with HOB elevated and use of bedrail. Sit to stand and stand pivot transfer with min A and RW. Dependent transport via w/c to/from therapy gym for time and energy conservation. Ambulation 2 x 65 ft, 2 x 35 ft with RW and min A for balance. Pt exhibits decreased gait speed and decreased endurance for gait with onset of fatigue. Ascend/descend 8 x 6" stairs with 2 handrails and min A for balance, max cueing to recall ascend with RLE and descend with LLE. Per pt report he only has R handrail at home and performed stairs laterally prior to admission. Ascend/descend 8 x 6" stairs laterally with R handrail and min A for balance. Pt reports feeling very fatigued and that stairs were "very hard" for him. Pt left seated in w/c in room with needs in reach, quick release belt and chair alarm in place at end of session.  Session 2: Pt received seated in w/c in room, agreeable to PT session. Pt  reports ongoing pain in L UE and LE, not rated. Pt reports he was only able to use ice pack x 5 min after last session due to lunch arriving. Pt reports urge to use the bathroom. Sit to stand with min A to RW. Ambulatory transfer into bathroom with RW and min A. Standing balance for toileting with close Supervision. Pt able to perform pericare and clothing management with setup A. Sit to supine Supervision with use of bedrail. Supine heel slides x 10 reps with cues to hold knee in flexion, pt with ongoing limited L knee flexion. L knee flexion AAROM in attempt to increase knee ROM, pt with fair tolerance for AAROM with onset of L knee pain with overpressure. Knee pain resolves at rest. Pt left seated in bed with needs in reach, bed alarm in place, ice pack to LUE for pain management.  Therapy Documentation Precautions:  Precautions Precautions: Fall Precaution Comments: Lifevest Restrictions Weight Bearing Restrictions: No    Therapy/Group: Individual Therapy   Excell Seltzer, PT, DPT, CSRS  06/15/2021, 12:35 PM

## 2021-06-15 NOTE — Progress Notes (Signed)
PROGRESS NOTE   Subjective/Complaints: Lovena Le said patient doing great with therapy! I asked if patient is receiving reduced frequency of CBGs. He said it was still checked in afternoon and looks like it was. Changing order to daily- overall well controlled   ROS:  Pt denies SOB, abd pain, CP, N/V and vision changes, +weakness and pain in left thumb    Objective:   No results found. Recent Labs    06/13/21 0502  WBC 11.6*  HGB 10.5*  HCT 31.7*  PLT 432*   Recent Labs    06/13/21 0502  NA 136  K 4.4  CL 110  CO2 20*  GLUCOSE 102*  BUN 42*  CREATININE 2.62*  CALCIUM 10.1    Intake/Output Summary (Last 24 hours) at 06/15/2021 1312 Last data filed at 06/15/2021 0753 Gross per 24 hour  Intake 985 ml  Output 1650 ml  Net -665 ml     Pressure Injury 06/12/21 Back Mid;Left Stage 2 -  Partial thickness loss of dermis presenting as a shallow open injury with a red, pink wound bed without slough. Left side mid back, stage 2, layer of skin missing, covered with foam dressing, area pink. (Active)  06/12/21 1600  Location: Back  Location Orientation: Mid;Left  Staging: Stage 2 -  Partial thickness loss of dermis presenting as a shallow open injury with a red, pink wound bed without slough.  Wound Description (Comments): Left side mid back, stage 2, layer of skin missing, covered with foam dressing, area pink.  Present on Admission: Yes    Physical Exam: Vital Signs Blood pressure (!) 141/61, pulse 65, temperature 98.7 F (37.1 C), temperature source Oral, resp. rate 18, height 6\' 4"  (1.93 m), weight 113.3 kg, SpO2 97 %. Gen: no distress, normal appearing HEENT: oral mucosa pink and moist, NCAT Cardio: Reg rate Chest: normal effort, normal rate of breathing Abd: soft, non-distended Ext: no edema Psych: pleasant, normal affect Musculoskeletal:        General: Swelling (1+ LUE and min LLE.) present.    Cervical  back: Normal range of motion and neck supple.    Comments: RUE- 5/5- in all muscles checked from proximal to distal LUE_ at least 3+/5- cannot tolerate any pressure on LUE to test strength further RLE- 5/5- in all muscle tested LLE- at least 3/5 in HF, and KF/KE- DF and PF at least 4/5- appears slightly less than RLE Decreased ROM of L knee- hard to tell if muscular/skin related (due to skin necrosis/pain) or if actually has lost ROM - cannot flex L knee to 90 degrees.    Skin:    Comments: Trace LLE edema and L hand edema- not pitting Saw pics of LUE/LLE Very deep purple where blisters have denuded- with some associated necrosis. On both limbs- has kerlex dressings in place C/D/I R forearm - looks OK Skin tear L thoracic area- no skin breakdown seen on backside Missing 2 front teeth.   Neurological:    Mental Status: He is alert and oriented to person, place, and time.    Comments: Slow and measured. Able to follow simple commands.   Delayed to respond to questions and to ask questions-  significant delay noted Tremor in LUE with movement/reaching- intention tremor        Assessment/Plan: 1. Functional deficits which require 3+ hours per day of interdisciplinary therapy in a comprehensive inpatient rehab setting. Physiatrist is providing close team supervision and 24 hour management of active medical problems listed below. Physiatrist and rehab team continue to assess barriers to discharge/monitor patient progress toward functional and medical goals  Care Tool:  Bathing    Body parts bathed by patient: Right arm, Left arm, Chest, Abdomen, Front perineal area, Right upper leg, Left upper leg   Body parts bathed by helper: Buttocks, Left lower leg, Right lower leg (Lt foot, calf area n/a due to wounds)     Bathing assist Assist Level: Moderate Assistance - Patient 50 - 74%     Upper Body Dressing/Undressing Upper body dressing   What is the patient wearing?: Pull over shirt,  Orthosis    Upper body assist Assist Level: Moderate Assistance - Patient 50 - 74%    Lower Body Dressing/Undressing Lower body dressing      What is the patient wearing?: Pants     Lower body assist Assist for lower body dressing: Minimal Assistance - Patient > 75%     Toileting Toileting    Toileting assist Assist for toileting: Moderate Assistance - Patient 50 - 74% Assistive Device Comment: urinal   Transfers Chair/bed transfer  Transfers assist     Chair/bed transfer assist level: Minimal Assistance - Patient > 75%     Locomotion Ambulation   Ambulation assist      Assist level: Minimal Assistance - Patient > 75% Assistive device: Walker-rolling Max distance: 65'   Walk 10 feet activity   Assist     Assist level: Minimal Assistance - Patient > 75% Assistive device: Walker-rolling   Walk 50 feet activity   Assist    Assist level: Minimal Assistance - Patient > 75% Assistive device: Walker-rolling    Walk 150 feet activity   Assist Walk 150 feet activity did not occur: Safety/medical concerns         Walk 10 feet on uneven surface  activity   Assist Walk 10 feet on uneven surfaces activity did not occur: Safety/medical concerns (could not tolerate assessment due to fatigue)         Wheelchair     Assist Will patient use wheelchair at discharge?: No             Wheelchair 50 feet with 2 turns activity    Assist            Wheelchair 150 feet activity     Assist          Blood pressure (!) 141/61, pulse 65, temperature 98.7 F (37.1 C), temperature source Oral, resp. rate 18, height 6\' 4"  (1.93 m), weight 113.3 kg, SpO2 97 %.  Medical Problem List and Plan: 1.  Anoxic brain injury secondary to cardiac arrest with CPR >90 minutes! S/P pressors with LUE/LLE wounds             -patient may shower if covers LUE/LLE             -ELOS/Goals: 2-2.15 weeks- supervision to mod I- needs PT, OT and SLP- pt  doesn't have insight into memory/cognitive issues.   Continue PT, OT and SLP 2.  Antithrombotics: -DVT/anticoagulation:  Pharmaceutical: Other (comment) --Eliquis.             -antiplatelet therapy: ASA 3. Chest wall pain: Continue  Oxycodone prn for chest pain- hx of taking oxy at home chronically, but stopped prior to admission due to constipation.  4. Mood: LCSW to follow for evaluation and support.              -antipsychotic agents: N/A 5. Neuropsych: This patient is? capable of making decisions on his own behalf. 6. Skin/Wound Care: local care to denuded blisters.             --continue Vitamin C. Will add protein supplements to promote wound healing.  7. Fluids/Electrolytes/Nutrition: Monitor I/O. Check lytes in am. 8. CAD/ICM: Treated medically --avoid nephrotoxic meds             --On BIDIL, coreg, ASA, Lipitor.             --monitor for symptoms with increase in activity.  9. Left forearm/left shin ulcers due to blistering/pressors/necrosis: Continue xeroform with dry dressing changes daily.  6/24- assessed this AM- look better than expected- con't wound care- will con't to monitor 10. Constipation:On Senna and miralax-->will increase miralax to bid. Senna increased to BID 11. Neuropathy: Will start low dose gabapentin at nights and titrate upwards as tolerated.             --had psychosis with Lyrica.  6/24- will increase gabapentin to 300 mg QHS- if tolerates well, Sunday/Monday will increase to 300 mg TID if Cr allows 12. Hyperglycemia:  Hgb A1C- 5.6: decrease CBGs to daily.  13. PAF: Monitor HR tid--continue amiodarone, Coreg and Eliquis 14. VT/VF: LifeVest in place--will need continued education on management.             --Amiodarone 400 mg bid-->100 mg on 06/29  6/24- advised pt not to remove Life Vest- wrote order to do same 15. AKI: Resolving. Continue to avoid nephrotoxic drugs and monitor for recovery.  16. COPD: Stable without MDI at this time.             --monitor  for symptoms with increase in activity. 17. sCHF- with EF of 25%- will monitor daily weights and monitor 18. Left thumb pain/weakness: discussed prognosis, ordered ice to forearm, lidocaine jelly to thumb.        LOS: 3 days A FACE TO FACE EVALUATION WAS PERFORMED  Emelyn Roen P Talena Neira 06/15/2021, 1:12 PM

## 2021-06-15 NOTE — Progress Notes (Signed)
Pt is concerned about DBP and reports that if lower than 60 was told by heart doctor not to take blood pressure medications.

## 2021-06-15 NOTE — Progress Notes (Signed)
Occupational Therapy Session Note  Patient Details  Name: Ross [name] MRN: [id number] Date of Birth: 1956-08-28  Today's Date: 06/15/2021 OT Individual Time: [phone] and 1450-1535 OT Individual Time Calculation (min): 59 min and 45 min  Short Term Goals: Week 1:  OT Short Term Goal 1 (Week 1): Pt will complete an ambulatory toilet transfer using LRAD and no more than Min A OT Short Term Goal 2 (Week 1): Pt will complete LB dressing with Min A using adaptive strategies as needed OT Short Term Goal 3 (Week 1): Pt will complete 1 grooming task while standing at the sink for more than 10 sec to increase functional standing endurance  Skilled Therapeutic Interventions/Progress Updates:    Pt greeted EOB with RN present, just finishing up with morning medication. Pt able to change the LifeVest battery with supervision from RN. Pt asked if he could be set up with oral care while sitting EOB, set up provided. Short distance ambulatory transfer then completed using RW to w/c parked in front of sink with CGA. OT washed his hair using the hair washing tray per pt request. Worked on UB strengthening/endurance afterwards by blow-drying + combing hair. Shaving was then completed w/c level at the sink. Strongly encouraged him to stand for any portion of ADL tasks completed today but pt declined due to feeling especially anxious this AM. Agreeable to ambulate some with encouragement. CGA for ambulation to the dayroom using RW, vcs for standing closer to walker and for forward gaze. He then sat in front of the window to look outdoors. We plan to go outdoors during PM session for psychosocial health and wellbeing. Pt refused to ambulate back to the room due to feelings of anxiety, stated that he had dizzy spells PTA and barely able to make it to a chair before falling. OT provided close w/c follow to address during session, reeducated pt that he can just tell therapy staff to keep the w/c close by whenever he  ambulates. Pt was then returned to room via w/c, left with all needs within reach and safety belt fastened.   2nd Session 1:1 tx (45 min) Pt greeted in the w/c and premedicated for pain. His SO Ross [name] was present. Pt remembered that we had planned to go outdoors during session today. Started with ambulatory transfer to toilet using RW with CGA. Pt with +bladder void. Cued him to complete hand washing while standing at the sink and pt initiated completing oral care instead. He was then escorted via w/c to the outdoor patio. Pt opting to self propel the w/c x3 laps around fountain vs ambulate, therapeutic focus placed on UB strengthening at this time. Returned to the unit and went to the ADL apartment. Discussed bathroom DME options with pt and SO for home. They have a tub shower and Ross [name] was thinking about purchasing a bench with a swivel seat or using a 3:1 and having pt side-step over small ledge. Discussed using a TTB instead and OT demonstrated transfer method. Ross [name] took pictures of the grab bars, as she plans to have them manually installed. Also took a picture of the 3:1 because she wants a riser for the toilet. Pt was then escorted back to his room and remained sitting up in the w/c, all needs within reach and safety belt fastened.   Therapy Documentation Precautions:  Precautions Precautions: Fall Precaution Comments: Lifevest Restrictions Weight Bearing Restrictions: No  Pain: Lt sided wound pain + back pain, pt premedicated for pain  ADL: ADL Eating: Not assessed Grooming: Setup Where Assessed-Grooming: Edge of bed Upper Body Bathing: Supervision/safety Where Assessed-Upper Body Bathing: Edge of bed Lower Body Bathing: Moderate assistance Where Assessed-Lower Body Bathing: Edge of bed (perihygiene completed partially in seated using lateral leans due to pts increased feelings of anxiety with standing >10 seconds) Upper Body Dressing: Moderate assistance (shirt +  LifeVest) Where Assessed-Upper Body Dressing: Edge of bed Lower Body Dressing: Moderate assistance Where Assessed-Lower Body Dressing: Edge of bed Toileting: Minimal assistance Where Assessed-Toileting: Bedside Commode Toilet Transfer: Minimal assistance Toilet Transfer Method: Stand pivot (without AD) Toilet Transfer Equipment: Bedside commode Tub/Shower Transfer: Not assessed     Therapy/Group: Individual Therapy  Ross [name] A Jerone Cudmore 06/15/2021, 12:14 PM

## 2021-06-15 NOTE — IPOC Note (Signed)
Overall Plan of Care [facility]) Patient Details Name: Ross [name] MRN: [id number] DOB: 23-Aug-1956  Admitting Diagnosis: Anoxic brain injury [facility])  Hospital Problems: Principal Problem:   Anoxic brain injury ([place]) Active Problems:   Persistent atrial fibrillation ([place])   Cardiac arrest ([place])   Pressure injury of skin     Functional Problem List: Nursing Endurance, Medication Management, Nutrition, Pain, Safety, Skin Integrity  PT Balance, Endurance, Motor, Pain, Perception, Safety, Sensory, Skin Integrity  OT Balance, Behavior, Safety, Sensory, Edema, Skin Integrity, Endurance, Motor, Pain, Cognition  SLP Safety, Cognition  TR         Basic ADL's: OT Bathing, Dressing, Toileting, Grooming     Advanced  ADL's: OT Laundry     Transfers: PT Bed Mobility, Bed to Chair, Musician, Manufacturing systems engineer, Metallurgist: PT Ambulation, Emergency planning/management officer, Stairs     Additional Impairments: OT Fuctional Use of Upper Extremity (needing active assist from the Rt hand to apply deoderant/wash underarm)  SLP Swallowing      TR      Anticipated Outcomes Item Anticipated Outcome  Self Feeding No goal  Swallowing  Mod I   Basic self-care  Media planner Transfers Supervision  Bowel/Bladder  N/A  Transfers  supervision  Locomotion  supervision  Communication     Cognition  Supervision  Pain  <3  Safety/Judgment  Mod I with no falls   Therapy Plan: PT Intensity: Minimum of 1-2 x/day ,45 to 90 minutes PT Frequency: 5 out of 7 days PT Duration Estimated Length of Stay: 14-18 days OT Intensity: Minimum of 1-2 x/day, 45 to 90 minutes OT Frequency: 5 out of 7 days OT Duration/Estimated Length of Stay: 2-2.5 weeks SLP Intensity: Minumum of 1-2 x/day, 30 to 90 minutes SLP Frequency: 3 to 5 out of 7 days SLP Duration/Estimated Length of Stay: 2-3 weeks   Due to the current state of emergency, patients may not be receiving their  3-hours of Medicare-mandated therapy.   Team Interventions: Nursing Interventions Patient/Family Education, Disease Management/Prevention, Pain Management, Medication Management, Skin Care/Wound Management, Discharge Planning, Psychosocial Support  PT interventions Ambulation/gait training, Community reintegration, DME/adaptive equipment instruction, Neuromuscular re-education, Psychosocial support, Stair training, UE/LE Strength taining/ROM, Wheelchair propulsion/positioning, Training and development officer, Discharge planning, Pain management, Therapeutic Activities, UE/LE Coordination activities, Cognitive remediation/compensation, Disease management/prevention, Functional mobility training, Patient/family education, Therapeutic Exercise  OT Interventions Balance/vestibular training, Discharge planning, Pain management, Self Care/advanced ADL retraining, Therapeutic Activities, UE/LE Coordination activities, Therapeutic Exercise, Skin care/wound managment, Patient/family education, Functional mobility training, Disease mangement/prevention, Cognitive remediation/compensation, Academic librarian, Engineer, drilling, Psychosocial support, UE/LE Strength taining/ROM, Wheelchair propulsion/positioning  SLP Interventions Cognitive remediation/compensation, Dysphagia/aspiration precaution training, Cueing hierarchy, Internal/external aids, Medication managment, Therapeutic Activities, Therapeutic Exercise, Patient/family education, Functional tasks  TR Interventions    SW/CM Interventions Discharge Planning, Psychosocial Support, Patient/Family Education   Barriers to Discharge MD  Medical stability, Wound care, and Behavior  Nursing Decreased caregiver support, Home environment access/layout, Wound Care, Lack of/limited family support, Weight, Medication compliance, Behavior, Nutrition means Discharging to daughter's home, 1 level home with level entry.  PT Inaccessible home environment,  Decreased caregiver support, Home environment access/layout, Weight, Other (comments) cognition  OT Home environment access/layout, Behavior If d/c home with girlfriend, will have 14 stairs to alternate level, irritibility/feelings of anxiety limiting participation  SLP Decreased caregiver support    SW       Team Discharge Planning: Destination: PT-Home ,OT- Home , SLP-Home Projected Follow-up:  PT-Home health PT, OT-  Home health OT, SLP-Home Health SLP (if swallowing issues aren't resolved at d/c) Projected Equipment Needs: PT-To be determined, OT- To be determined, SLP-None recommended by SLP Equipment Details: PT- , OT-  Patient/family involved in discharge planning: PT- Patient,  OT-Patient, SLP-Patient  MD ELOS: 2-2.5 weeks  Medical Rehab Prognosis:  Good Assessment: Ross [name] is a 65 year old man admitted to CIR with anoxic brain injury secondary to cardiac arrest with CPR >90 minutes, S/P pressors with LUE/LLE wounds. Medications are being managed, and labs and vitals are being monitored regularly.     See Team Conference Notes for weekly updates to the plan of care

## 2021-06-16 LAB — BASIC METABOLIC PANEL
Anion gap: 5 (ref 5–15)
BUN: 46 mg/dL — ABNORMAL HIGH (ref 8–23)
CO2: 22 mmol/L (ref 22–32)
Calcium: 10.6 mg/dL — ABNORMAL HIGH (ref 8.9–10.3)
Chloride: 109 mmol/L (ref 98–111)
Creatinine, Ser: 2.29 mg/dL — ABNORMAL HIGH (ref 0.61–1.24)
GFR, Estimated: 31 mL/min — ABNORMAL LOW (ref >60)
Glucose, Bld: 135 mg/dL — ABNORMAL HIGH (ref 70–99)
Potassium: 5 mmol/L (ref 3.5–5.1)
Sodium: 136 mmol/L (ref 135–145)

## 2021-06-16 LAB — GLUCOSE, CAPILLARY: Glucose-Capillary: 132 mg/dL — ABNORMAL HIGH (ref 70–99)

## 2021-06-16 MED ORDER — DIPHENHYDRAMINE HCL 12.5 MG/5ML PO ELIX: 12.5000 mg | ORAL_SOLUTION | Freq: Four times a day (QID) | ORAL | 0 refills | Status: DC | PRN

## 2021-06-16 MED ORDER — PANTOPRAZOLE SODIUM 40 MG PO TBEC
40.0000 mg | DELAYED_RELEASE_TABLET | Freq: Every day | ORAL | Status: DC
  Administered 2021-06-16 – 2021-06-27 (×12): 40 mg via ORAL
  Filled 2021-06-16 (×12): qty 1

## 2021-06-16 NOTE — Progress Notes (Signed)
Occupational Therapy Note  Patient Details  Name: Ross [name] MRN: [id number] Date of Birth: Jan 13, 1956  Today's Date: 06/16/2021 OT Missed Time: 55 Minutes Missed Time Reason: Patient fatigue  Pt sleeping in bed upon arrival. Pt required mod verbal cues to arouse and unable to keep eyes open. Pt missed 45 mins skilled OT services.   [name] [facility] 06/16/2021, 2:15 PM

## 2021-06-16 NOTE — Progress Notes (Signed)
Reviewed equipment in room for life vest. Pt needs electrodes cleaned and vest changed. All equipment available. However we have not worked with this life [employer] that delivered vest at [phone]. Informed that pt and staff need education regarding product. Rep will come out w/I 24 hours to change out life vest and also provide staff education. Rep will contact charge phone/dept # for time.

## 2021-06-16 NOTE — Progress Notes (Signed)
PROGRESS NOTE   Subjective/Complaints:   Pt reports to nurse that needs to exchange life vest- spoke with nurse about this- to call [place] if need be.   Also, pt reports nerve pain is MUCH better controlled like night and day- it's so much better- he's on 300 mg QHS of gabapentin.   No side effects LBM yesterday.    ROS:  Pt denies SOB, abd pain, CP, N/V/C/D, and vision changes    Objective:   No results found. No results for input(s): WBC, HGB, HCT, PLT in the last 72 hours.  Recent Labs    06/16/21 0531  NA 136  K 5.0  CL 109  CO2 22  GLUCOSE 135*  BUN 46*  CREATININE 2.29*  CALCIUM 10.6*    Intake/Output Summary (Last 24 hours) at 06/16/2021 1035 Last data filed at 06/16/2021 6314 Gross per 24 hour  Intake 300 ml  Output --  Net 300 ml     Pressure Injury 06/12/21 Back Mid;Left Stage 2 -  Partial thickness loss of dermis presenting as a shallow open injury with a red, pink wound bed without slough. Left side mid back, stage 2, layer of skin missing, covered with foam dressing, area pink. (Active)  06/12/21 1600  Location: Back  Location Orientation: Mid;Left  Staging: Stage 2 -  Partial thickness loss of dermis presenting as a shallow open injury with a red, pink wound bed without slough.  Wound Description (Comments): Left side mid back, stage 2, layer of skin missing, covered with foam dressing, area pink.  Present on Admission: Yes    Physical Exam: Vital Signs Blood pressure 135/64, pulse 62, temperature 98.6 F (37 C), temperature source Oral, resp. rate 18, height 6\' 4"  (1.93 m), weight 109.2 kg, SpO2 96 %.    General: awake, alert, appropriate, sitting up in bed- asked to be covered up; NAD HENT: conjugate gaze; oropharynx moist CV: regular rate and rhythm currently; no JVD- wearing original life vest. Pulmonary: CTA B/L; no W/R/R- good air movement- sounds great GI: soft, NT,  ND, (+)BS Psychiatric: appropriate but delayed responses still- somewhat better and less slow in speech.  Neurological:alert, sleepy  Musculoskeletal:        General: Swelling (1+ LUE and min LLE.) present.    Cervical back: Normal range of motion and neck supple.    Comments: RUE- 5/5- in all muscles checked from proximal to distal LUE_ at least 3+/5- cannot tolerate any pressure on LUE to test strength further RLE- 5/5- in all muscle tested LLE- at least 3/5 in HF, and KF/KE- DF and PF at least 4/5- appears slightly less than RLE Decreased ROM of L knee- hard to tell if muscular/skin related (due to skin necrosis/pain) or if actually has lost ROM - cannot flex L knee to 90 degrees.    Skin:    Comments: Trace LLE edema and L hand edema- not pitting Saw pics of LUE/LLE Very deep purple where blisters have denuded- with some associated necrosis. On both limbs- has kerlex dressings in place C/D/I R forearm - looks OK Skin tear L thoracic area- no skin breakdown seen on backside Missing 2 front teeth.  Neurological:    Mental Status: He is alert and oriented to person, place, and time.    Comments: Slow and measured. Able to follow simple commands.   Delayed to respond to questions and to ask questions- significant delay noted Tremor in LUE with movement/reaching- intention tremor        Assessment/Plan: 1. Functional deficits which require 3+ hours per day of interdisciplinary therapy in a comprehensive inpatient rehab setting. Physiatrist is providing close team supervision and 24 hour management of active medical problems listed below. Physiatrist and rehab team continue to assess barriers to discharge/monitor patient progress toward functional and medical goals  Care Tool:  Bathing    Body parts bathed by patient: Right arm, Left arm, Chest, Abdomen, Front perineal area, Right upper leg, Left upper leg   Body parts bathed by helper: Buttocks, Left lower leg, Right lower  leg (Lt foot, calf area n/a due to wounds)     Bathing assist Assist Level: Moderate Assistance - Patient 50 - 74%     Upper Body Dressing/Undressing Upper body dressing   What is the patient wearing?: Pull over shirt, Orthosis    Upper body assist Assist Level: Moderate Assistance - Patient 50 - 74%    Lower Body Dressing/Undressing Lower body dressing      What is the patient wearing?: Pants     Lower body assist Assist for lower body dressing: Minimal Assistance - Patient > 75%     Toileting Toileting    Toileting assist Assist for toileting: Moderate Assistance - Patient 50 - 74% Assistive Device Comment: urinal   Transfers Chair/bed transfer  Transfers assist     Chair/bed transfer assist level: Minimal Assistance - Patient > 75%     Locomotion Ambulation   Ambulation assist      Assist level: Minimal Assistance - Patient > 75% Assistive device: Walker-rolling Max distance: 65'   Walk 10 feet activity   Assist     Assist level: Minimal Assistance - Patient > 75% Assistive device: Walker-rolling   Walk 50 feet activity   Assist    Assist level: Minimal Assistance - Patient > 75% Assistive device: Walker-rolling    Walk 150 feet activity   Assist Walk 150 feet activity did not occur: Safety/medical concerns         Walk 10 feet on uneven surface  activity   Assist Walk 10 feet on uneven surfaces activity did not occur: Safety/medical concerns (could not tolerate assessment due to fatigue)         Wheelchair     Assist Will patient use wheelchair at discharge?: No             Wheelchair 50 feet with 2 turns activity    Assist            Wheelchair 150 feet activity     Assist          Blood pressure 135/64, pulse 62, temperature 98.6 F (37 C), temperature source Oral, resp. rate 18, height 6\' 4"  (1.93 m), weight 109.2 kg, SpO2 96 %.  Medical Problem List and Plan: 1.  Anoxic brain injury  secondary to cardiac arrest with CPR >90 minutes! S/P pressors with LUE/LLE wounds             -patient may not shower due to life vest-              -ELOS/Goals: 2-2.15 weeks- supervision to mod I- needs PT, OT and SLP- pt doesn't  have insight into memory/cognitive issues.  Continue CIR- PT, OT and SLP 2.  Antithrombotics: -DVT/anticoagulation:  Pharmaceutical: Other (comment) --Eliquis.             -antiplatelet therapy: ASA 3. Chest wall pain: Continue Oxycodone prn for chest pain- hx of taking oxy at home chronically, but stopped prior to admission due to constipation.   6/27- Pain is doing better- con't regimen 4. Mood: LCSW to follow for evaluation and support.              -antipsychotic agents: N/A 5. Neuropsych: This patient is? capable of making decisions on his own behalf. 6. Skin/Wound Care: local care to denuded blisters.             --continue Vitamin C. Will add protein supplements to promote wound healing.  7. Fluids/Electrolytes/Nutrition: Monitor I/O. Check lytes in am. 8. CAD/ICM: Treated medically --avoid nephrotoxic meds             --On BIDIL, coreg, ASA, Lipitor.             --monitor for symptoms with increase in activity.  9. Left forearm/left shin ulcers due to blistering/pressors/necrosis: Continue xeroform with dry dressing changes daily.  6/24- assessed this AM- look better than expected- con't wound care- will con't to monitor 10. Constipation:On Senna and miralax-->will increase miralax to bid. Senna increased to BID 11. Neuropathy: Will start low dose gabapentin at nights and titrate upwards as tolerated.             --had psychosis with Lyrica.  6/24- will increase gabapentin to 300 mg QHS- if tolerates well, Sunday/Monday will increase to 300 mg TID if Cr allows  6/27- says it's MUCH better controlled- will con't Gabapentin 300 mg QHS per pt request.  12. Hyperglycemia:  Hgb A1C- 5.6: decrease CBGs to daily.  13. PAF: Monitor HR tid--continue amiodarone,  Coreg and Eliquis 14. VT/VF: LifeVest in place--will need continued education on management.             --Amiodarone 400 mg bid-->100 mg on 06/29  6/24- advised pt not to remove Life Vest- wrote order to do same  6/27- can change out with other life vest- let nursing know to not take off, but exchange actual vest.  15. AKI: Resolving. Continue to avoid nephrotoxic drugs and monitor for recovery.   6/27- Cr down to 229- is continuing  to improve- will monitor 16. COPD: Stable without MDI at this time.             --monitor for symptoms with increase in activity. 17. sCHF- with EF of 25%- will monitor daily weights and monitor 18. Left thumb pain/weakness: discussed prognosis, ordered ice to forearm, lidocaine jelly to thumb.        LOS: 4 days A FACE TO FACE EVALUATION WAS PERFORMED  Marica Trentham 06/16/2021, 10:35 AM

## 2021-06-16 NOTE — Progress Notes (Signed)
Notified for Life Vest representative Donna Christen) states she will be reaching back out on tomorrow for education both with the patient and will ck with her supervision concerning staff education. This patient was informed of additional education to be provided tomorrow on his Life Vest.

## 2021-06-16 NOTE — Progress Notes (Signed)
Physical Therapy Session Note  Patient Details  Name: Ross [name] MRN: [id number] Date of Birth: 10/12/1956  Today's Date: 06/16/2021 PT Individual Time: 0900-1000 and 1545-1615 PT Individual Time Calculation (min): 60 min  and 8min  Short Term Goals: Week 1:  PT Short Term Goal 1 (Week 1): Pt will perform bed to/from wc transfers w/LRAD and cga PT Short Term Goal 2 (Week 1): Pt will perform supine to/from sit w/min to cga PT Short Term Goal 3 (Week 1): Pt will ambulate 130ft w/LRAD and cga PT Short Term Goal 4 (Week 1): Pt will ascend/descend 8 stairs w/one rail w/min assist Week 2:     Skilled Therapeutic Interventions/Progress Updates:    AM SESSION Pain:  Pt reports L UE/LLE neuropathic pain.  Treatment to tolerance.  Rest breaks and repositioning as needed.  Pt initially supine and agreeable to treatment session w/focus on endurance, balance, functional mobility. Pt initially voicing concern re: fear of falling, difficulty trusting others, explains this is a trait of his. Supine to sit w/supervision, additional time.  Sits several min then Sit to stand w/cga to RW. Short distance gait to wc w/RW w/supervision.  Pt transported to gym.  Pt oriented, alert, less delayed than at evaluation.    Gait 386ft w/RW w/close supervision including turning, good cadence.  Added dual task simple math, naming objects given first letter/various categories, results in decreased cadence, no other changes. HR 110, 02 sats x 98%.  Hr quickly recovers w/seated rest to 80s.  Pt reluctantly agreeable to gait without AD but w/light HHA of 2.  Gait 58ft x 2 no AD, HHA for dynamic balance challenge.  Pt w/significantly decreased cadence, states feeling insecure w/this challenge but stable w/min to cga/HHA seated rest between trials.  HR remains in 80s w/short distance gait.  Standing dual task activity: Pt stood >5 min , chose cards from stack to L and matched cards to master card hanging on miiror  in front of pt requiring functional reach of approsx 2-5 inches from extended arm.  Completes matching 18 cards w/2 errors of matching suit, corrects when cued to recognize error.  At end of session, pt transported to room.  stand pivot transfer to bed w/cga.  Pt left supine w/rails up x 3, alarm set, bed in lowest position, and needs in reach.   PM SESSION Pain:  Pt reports headache pain, cites nurse awakening him w/loud voice, irritated by this.  See ABI ed below.  Treatment to tolerance.  Rest breaks and repositioning as needed.  Pt initially sitting on edge of bed.  Initially reluctant but fiancee at bedside and encourages pt to participate.  Pt then agreeable to treatment session at bedside only.  Sit to stand w/RW w/supervision and set up.  Gait 17ft w/cga including 3 turns.  HR 102, regular rhythm palpated at wrist.  Repeated Sit to stand x 8 no UE use, no AD for global strenthening and balance challenge, cga  Education perfomed during rest breaks, discussed overstimulation, sensory processing, low stim environment to allow for "brain rest", importance of icardiovascular activity for brain injury and cardiac recovery Standing balance - feet together- maintains up to 8 sec w/no UE assist, increased sway. Standing semitandem up to 10 sec cga to min assist, increased sway.  At end of session, pt left seated on edge of bed as found, fiancee at side, alarm set, needs in reach.   Therapy Documentation Precautions:  Precautions Precautions: Fall Precaution Comments: Lifevest Restrictions Weight Bearing  Restrictions: No    Therapy/Group: Individual Therapy Callie Fielding, [place] 06/16/2021, 12:25 PM

## 2021-06-16 NOTE — Progress Notes (Signed)
Speech Language Pathology Daily Session Note  Patient Details  Name: Ross [name] MRN: [id number] Date of Birth: [birth year]  Today's Date: 06/16/2021 SLP Individual Time: 8416-6063 SLP Individual Time Calculation (min): 45 min  Short Term Goals: Week 1: SLP Short Term Goal 1 (Week 1): Pt will tolerate Dys3/thin diet with minimal s/s aspiration and dysphagia with Supervision level cues for swallow strategies SLP Short Term Goal 2 (Week 1): Pt will participate in MBSS to assess anatomy and physiology of swallow SLP Short Term Goal 3 (Week 1): Pt will complete med/money management tasks with min A SLP Short Term Goal 4 (Week 1): Pt will complete moderately complex problem solving tasks with min A SLP Short Term Goal 5 (Week 1): Pt will recall novel information with min A  Skilled Therapeutic Interventions:Skilled ST services focused on cognitive skills. SLP communicated with pt about chronic swallow issues reporting globus sensation and reports has worsened since hospital admission. Pt politely refused dys 3 and regular trial snack. Pt is not currently taking anti-acid medication and per chart review has not participated in esophageal nor instrumental swallow assessment. SLP ordered MBS after consulting with MD via secure Epic chat and pt is agreeable. SLP facilitated mildly complex and complex problem solving skills in account balancing tasks. Pt demonstrated mod I in mildly complex task and required min A fading to supervision A verbal cues for error awareness in complex tasks when using pen/paper. Pt expressed frustration with novel cognitive deficits and SLP provided emotional support and neuro rehabilitation education. Pt was left in room with call bell within reach and bed/chair alarm set. SLP recommends to continue skilled services.     Pain Pain Assessment Pain Score: 0-No pain  Therapy/Group: Individual Therapy  [name]  [facility] 06/16/2021, 12:32 PM

## 2021-06-17 ENCOUNTER — Inpatient Hospital Stay (HOSPITAL_COMMUNITY): Payer: Medicare Other

## 2021-06-17 DIAGNOSIS — I96 Gangrene, not elsewhere classified: Secondary | ICD-10-CM

## 2021-06-17 DIAGNOSIS — I469 Cardiac arrest, cause unspecified: Secondary | ICD-10-CM

## 2021-06-17 DIAGNOSIS — I4819 Other persistent atrial fibrillation: Secondary | ICD-10-CM

## 2021-06-17 LAB — GLUCOSE, CAPILLARY: Glucose-Capillary: 91 mg/dL (ref 70–99)

## 2021-06-17 MED ORDER — LINACLOTIDE 145 MCG PO CAPS
145.0000 ug | ORAL_CAPSULE | Freq: Every day | ORAL | Status: DC | PRN
  Filled 2021-06-17: qty 1

## 2021-06-17 MED ORDER — METHOCARBAMOL 500 MG PO TABS
500.0000 mg | ORAL_TABLET | Freq: Four times a day (QID) | ORAL | Status: DC | PRN
  Administered 2021-06-21: 500 mg via ORAL
  Filled 2021-06-17: qty 1

## 2021-06-17 NOTE — Progress Notes (Addendum)
PROGRESS NOTE   Subjective/Complaints:   LBM 2 days ago- at home, used Linzess before breakfast 3-4x/month- is going here, but not daily, which is pretty normal for pt at home since stopped Oxy at home.  Nerve pain doing pretty well.  LUE/LLE- having painful spasms- Doesn't know why.Bothersome.    ROS: Pt denies SOB, abd pain, CP, N/V/C/D, and vision changes   Objective:   No results found. No results for input(s): WBC, HGB, HCT, PLT in the last 72 hours.  Recent Labs    06/16/21 0531  NA 136  K 5.0  CL 109  CO2 22  GLUCOSE 135*  BUN 46*  CREATININE 2.29*  CALCIUM 10.6*    Intake/Output Summary (Last 24 hours) at 06/17/2021 0932 Last data filed at 06/17/2021 0236 Gross per 24 hour  Intake 900 ml  Output --  Net 900 ml     Pressure Injury 06/12/21 Back Mid;Left Stage 2 -  Partial thickness loss of dermis presenting as a shallow open injury with a red, pink wound bed without slough. Left side mid back, stage 2, layer of skin missing, covered with foam dressing, area pink. (Active)  06/12/21 1600  Location: Back  Location Orientation: Mid;Left  Staging: Stage 2 -  Partial thickness loss of dermis presenting as a shallow open injury with a red, pink wound bed without slough.  Wound Description (Comments): Left side mid back, stage 2, layer of skin missing, covered with foam dressing, area pink.  Present on Admission: Yes    Physical Exam: Vital Signs Blood pressure 125/64, pulse 63, temperature 98.1 F (36.7 C), temperature source Oral, resp. rate 18, height 6\' 4"  (1.93 m), weight 109.6 kg, SpO2 97 %.     General: awake, alert, appropriate, sitting up in w/c- nurse and OT in room; at sink washing up; NAD HENT: conjugate gaze; oropharynx moist- put in partial plate CV: regular rate- sounds RRR this AM-  no JVD Pulmonary: CTA B/L; no W/R/R- good air movement- sounds good GI: soft, NT, ND, (+)BS-  hypoactive Psychiatric: appropriate but very flat- almost monotone voice Neurological: alert- less sleepy this AM  Musculoskeletal:        General: Swelling (1+ LUE and min LLE.) present.    Cervical back: Normal range of motion and neck supple.    Comments: RUE- 5/5- in all muscles checked from proximal to distal LUE_ at least 3+/5- cannot tolerate any pressure on LUE to test strength further RLE- 5/5- in all muscle tested LLE- at least 3/5 in HF, and KF/KE- DF and PF at least 4/5- appears slightly less than RLE Decreased ROM of L knee- hard to tell if muscular/skin related (due to skin necrosis/pain) or if actually has lost ROM - cannot flex L knee to 90 degrees.    Skin:    Comments: Trace LLE edema and L hand edema- not pitting- no change Dressing on LUE/LLE C/D/I Very deep purple where blisters have denuded- with some associated necrosis. On both limbs- has kerlex dressings in place C/D/I R forearm - looks OK Skin tear L thoracic area- no skin breakdown seen on backside Missing 2 front teeth.  Has partial plate Neurological:.  Comments: Slow and measured. Able to follow simple commands.   Delayed to respond to questions and to ask questions- significant delay noted Tremor in LUE with movement/reaching- intention tremor          Assessment/Plan: 1. Functional deficits which require 3+ hours per day of interdisciplinary therapy in a comprehensive inpatient rehab setting. Physiatrist is providing close team supervision and 24 hour management of active medical problems listed below. Physiatrist and rehab team continue to assess barriers to discharge/monitor patient progress toward functional and medical goals  Care Tool:  Bathing    Body parts bathed by patient: Right arm, Left arm, Chest, Abdomen, Front perineal area, Right upper leg, Left upper leg   Body parts bathed by helper: Buttocks, Left lower leg, Right lower leg (Lt foot, calf area n/a due to wounds)      Bathing assist Assist Level: Moderate Assistance - Patient 50 - 74%     Upper Body Dressing/Undressing Upper body dressing   What is the patient wearing?: Pull over shirt, Orthosis    Upper body assist Assist Level: Moderate Assistance - Patient 50 - 74%    Lower Body Dressing/Undressing Lower body dressing      What is the patient wearing?: Pants     Lower body assist Assist for lower body dressing: Minimal Assistance - Patient > 75%     Toileting Toileting    Toileting assist Assist for toileting: Moderate Assistance - Patient 50 - 74% Assistive Device Comment: urinal   Transfers Chair/bed transfer  Transfers assist     Chair/bed transfer assist level: Contact Guard/Touching assist     Locomotion Ambulation   Ambulation assist      Assist level: Contact Guard/Touching assist Assistive device: Walker-rolling Max distance: 360   Walk 10 feet activity   Assist     Assist level: Contact Guard/Touching assist Assistive device: Walker-rolling   Walk 50 feet activity   Assist    Assist level: Contact Guard/Touching assist Assistive device: Walker-rolling    Walk 150 feet activity   Assist Walk 150 feet activity did not occur: Safety/medical concerns  Assist level: Contact Guard/Touching assist Assistive device: Walker-rolling    Walk 10 feet on uneven surface  activity   Assist Walk 10 feet on uneven surfaces activity did not occur: Safety/medical concerns (could not tolerate assessment due to fatigue)         Wheelchair     Assist Will patient use wheelchair at discharge?: No             Wheelchair 50 feet with 2 turns activity    Assist            Wheelchair 150 feet activity     Assist          Blood pressure 125/64, pulse 63, temperature 98.1 F (36.7 C), temperature source Oral, resp. rate 18, height 6\' 4"  (1.93 m), weight 109.6 kg, SpO2 97 %.  Medical Problem List and Plan: 1.  Anoxic brain  injury secondary to cardiac arrest with CPR >90 minutes! S/P pressors with LUE/LLE wounds             -patient may not shower due to life vest-              -ELOS/Goals: 2-2.15 weeks- supervision to mod I- needs PT, OT and SLP- pt doesn't have insight into memory/cognitive issues.  Continue CIR- PT, OT and SLP 2.  Antithrombotics: -DVT/anticoagulation:  Pharmaceutical: Other (comment) --Eliquis.             -  antiplatelet therapy: ASA 3. Chest wall pain: Continue Oxycodone prn for chest pain- hx of taking oxy at home chronically, but stopped prior to admission due to constipation.   6/28- loves Gabapentin 300 mg QHS for nerve pain- con't regimen 4. Mood: LCSW to follow for evaluation and support.              -antipsychotic agents: N/A 5. Neuropsych: This patient is? capable of making decisions on his own behalf. 6. Skin/Wound Care: local care to denuded blisters.             --continue Vitamin C. Will add protein supplements to promote wound healing.   6/28- will reassess today after rounds- will go see- nurse to [facility] me.  7. Fluids/Electrolytes/Nutrition: Monitor I/O. Check lytes in am. 8. CAD/ICM: Treated medically --avoid nephrotoxic meds             --On BIDIL, coreg, ASA, Lipitor.             --monitor for symptoms with increase in activity.  9. Left forearm/left shin ulcers due to blistering/pressors/necrosis: Continue xeroform with dry dressing changes daily.  6/24- assessed this AM- look better than expected- con't wound care- will con't to monitor 10. Constipation:On Senna and miralax-->will increase miralax to bid. Senna increased to BID 11. Neuropathy: Will start low dose gabapentin at nights and titrate upwards as tolerated.             --had psychosis with Lyrica.  6/28- will con't gabapentin 300 mg qhs- [employer] 12. Hyperglycemia:  Hgb A1C- 5.6: decrease CBGs to daily.  13. PAF: Monitor HR tid--continue amiodarone, Coreg and Eliquis 14. VT/VF: LifeVest in  place--will need continued education on management.             --Amiodarone 400 mg bid-->100 mg on 06/29  6/24- advised pt not to remove Life Vest- wrote order to do same  6/27- can change out with other life vest- let nursing know to not take off, but exchange actual vest.   6/28- Life vest people are coming today to see if can change out actual vest so can clean the other life vest, if possible.  15. AKI: Resolving. Continue to avoid nephrotoxic drugs and monitor for recovery.   6/27- Cr down to 2.29- is continuing  to improve- will monitor  6/28- will recheck Thursday 16. COPD: Stable without MDI at this time.             --monitor for symptoms with increase in activity. 17. sCHF- with EF of 25%- will monitor daily weights and monitor  6/28- weights have gone down from max of 113 kg- down to 109.6 kg- con't to monitor 18. Left thumb pain/weakness: discussed prognosis, ordered ice to forearm, lidocaine jelly to thumb. 19. Constipation 6/28- hx of using Linzess prn- LBM 2 days ago- will restart Linzess PRN- so can take if no BM for 2-3 days/feels constipated- take before breakfast.      I spent a total of 35 minutes on total care today- >50% on coordination of care- discussing with pt about bowels as well as prognosis for wounds- saw pt 2x- 2nd time to assess wounds- as pics above     LOS: 5 days A FACE TO FACE EVALUATION WAS PERFORMED  Ross [name] 06/17/2021, 8:06 AM

## 2021-06-17 NOTE — Progress Notes (Signed)
Modified Barium Swallow Progress Note  Patient Details  Name: Ross [name] MRN: [id number] Date of Birth: August 08, 1956  Today's Date: 06/17/2021  Modified Barium Swallow completed.  Full report located under Chart Review in the Imaging Section.  Brief recommendations include the following:  Clinical Impression   Pt presents with mild pharyngoesophageal dysphagia characterized by reduce base of tongue approximation, reduced pharyngeal peristalsis, reduced anterior laryngeal movement and noted halting barium tablet in midsection of esophagus.  Pt initiated swallow for solids in the valleculae and in the pyriform sinuses for thin liquids. Pt demonstrated consistent and at times deep penetration with straw sips (PAS 2) that was cleared during the swallow when consuming thin liquids. Pt demonstrated trace residue in pyriform sinuses and trace-mild residue in the valleculae following initial swallows with both solids and liquids textures, however cleared with subsequent swallows. Pt demonstrated a mild reduction in anterior laryngeal movement, mild reduced approximation of base of tongue meeting pharyngeal wall and mild reduction in pharyngeal peristalsis; resulting in mild pharyngeal residue on base of tongue. Pt was able to clear majority of residue with cued dry swallows. Chin tuck strategy was not found to be effective in reducing residue. When pt consumed barium tablet with thin liquids, pill was halted in valleculae and required wash of puree to continue swallowing transition. Following the barium tablet down the esophagus, the barium tablet was again halted in the midsection of the esophagus and required dry swallows as well as puree wash to continue transition. During this time pt reported globus sensation. SLP recommends f/u of esophageal working up to further investigate. SLP recommends a diet of dys 3 textures and thin liquids, intermittent supervision A for small bites/sips, alternating  liquids/solids, dry swallow every few bites and remaining upright for 30-45 minutes following PO intake. SLP will target base of tongue, pharyngeal wall and anterior laryngeal strengthening exercises as well as carryover of swallow strategies.    Swallow Evaluation Recommendations   Recommended Consults: Consider GI evaluation;Consider esophageal assessment   SLP Diet Recommendations: Dysphagia 3 (Mech soft) solids;Thin liquid   Liquid Administration via: Cup;Straw   Medication Administration: Whole meds with puree   Supervision: Intermittent supervision to cue for compensatory strategies   Compensations: Slow rate;Small sips/bites;Follow solids with liquid;Effortful swallow;Multiple dry swallows after each bite/sip   Postural Changes: Seated upright at 90 degrees;Remain semi-upright after after feeds/meals (Comment)   Oral Care Recommendations: Oral care BID        Trentin Knappenberger 06/17/2021,1:58 PM

## 2021-06-17 NOTE — Progress Notes (Signed)
Physical Therapy Session Note  Patient Details  Name: Ross [name] MRN: [id number] Date of Birth: Oct 26, 1956  Today's Date: 06/17/2021 PT Individual Time: 3662-9476 PT Individual Time Calculation (min): 35 min   Short Term Goals: Week 1:  PT Short Term Goal 1 (Week 1): Pt will perform bed to/from wc transfers w/LRAD and cga PT Short Term Goal 2 (Week 1): Pt will perform supine to/from sit w/min to cga PT Short Term Goal 3 (Week 1): Pt will ambulate 136ft w/LRAD and cga PT Short Term Goal 4 (Week 1): Pt will ascend/descend 8 stairs w/one rail w/min assist  Skilled Therapeutic Interventions/Progress Updates:    Pt received in bed and agreeable to therapy. Supine<>sit with supervision. Pt requested to use the bathroom and was CGA for 3/3 toileting tasks. Pt donned shoes with assist for time. Stated they were tight but was agreeable to attempting gait. Gait x 20 ft before pt reported needing to sit immediately. Stated that his head had been hurting since beginning of session. Pt agreed to walk back to room and sit EOB. Pt then reported feeling dizzy and that he had felt dizzy since he first stood up. Pt returned to sidelying with supervision and was directed in deep breathing exercise to reduce feelings of anxiety. Pt was educated on using deep breathing when feeling anxious. Pt care handed off to [facility], PT for next session.   Therapy Documentation Precautions:  Precautions Precautions: Fall Precaution Comments: Lifevest Restrictions Weight Bearing Restrictions: No   Therapy/Group: Individual Therapy  Mickel Fuchs 06/17/2021, 4:14 PM

## 2021-06-17 NOTE — Progress Notes (Signed)
Occupational Therapy Session Note  Patient Details  Name: Ross [name] MRN: [id number] Date of Birth: 12-29-1955  Today's Date: 06/17/2021 OT Individual Time: 9476-5465 OT Individual Time Calculation (min): 72 min    Short Term Goals: Week 1:  OT Short Term Goal 1 (Week 1): Pt will complete an ambulatory toilet transfer using LRAD and no more than Min A OT Short Term Goal 2 (Week 1): Pt will complete LB dressing with Min A using adaptive strategies as needed OT Short Term Goal 3 (Week 1): Pt will complete 1 grooming task while standing at the sink for more than 10 sec to increase functional standing endurance   Skilled Therapeutic Interventions/Progress Updates:    Pt greeted at time of session up in wheelchair with RN present, wanting to brush his teeth. MD entered at this time as well making rounds. Pt needing to use bathroom at this time, walked short distance sink > bathroom CGA w/ RW and stood to urinate with close supervision. CGA for clothing management. Ambulated bathroom > wheelchair CGA and set up at sink for hair washing per pt request. Therapist assist with wetting/washing hair, pt drying with towel and grooming tasks for the following at sink level: brushing hair and shaving face with Set up/distant supervision. Sink level bathing with cues to use soap with Supervision UB and Min for LB for LLE only to reach, plan to give AE. Donned shirt Supervision and pants with Min A again for LLE to thread.  Donned new socks and set up in wheelchair with alarm on call bell in reach. Pt initially resistant to alarm, but agreeable with explanation and encouragement.    Therapy Documentation Precautions:  Precautions Precautions: Fall Precaution Comments: Lifevest Restrictions Weight Bearing Restrictions: No    Therapy/Group: Individual Therapy  Viona Gilmore 06/17/2021, 7:17 AM

## 2021-06-17 NOTE — Progress Notes (Signed)
Patient ID: Ross [name], male   DOB: [birth year], 65 y.o.   MRN: [id number] Spoke with daughter via telephone to discuss team conference goals of supervision level and discharge of 7/8. Daughter asked if insurance covers chair lift or someone to stay with him. Discussed they do not cover stair lifts or custodial care but will cover home health 2-3 times per week for one hour. She will need to get with rest of family and discuss best option for Dad at discharge. Also discussed family training prior to pt's discharge home. She will be here the end of the week, aware this worker will not be here but will ask for another Education officer, museum covering for me. Pt is aware of discharge date and wants to stay here and not go to [place]. Continue to work on discharge needs.

## 2021-06-17 NOTE — Progress Notes (Signed)
Occupational Therapy Session Note  Patient Details  Name: Ross [name] MRN: [id number] Date of Birth: 1956/04/18  Today's Date: 06/17/2021 OT Individual Time: 1130-1155 OT Individual Time Calculation (min): 25 min    Short Term Goals: Week 1:  OT Short Term Goal 1 (Week 1): Pt will complete an ambulatory toilet transfer using LRAD and no more than Min A OT Short Term Goal 2 (Week 1): Pt will complete LB dressing with Min A using adaptive strategies as needed OT Short Term Goal 3 (Week 1): Pt will complete 1 grooming task while standing at the sink for more than 10 sec to increase functional standing endurance  Skilled Therapeutic Interventions/Progress Updates:    OT intervention with focus on bed mobility, sit<>stand, standing balance, functional amb with RW, functional transers without AD, safety awareness, and therex to increase independence with BADLs. Pt with flat affect throughout session. Pt requested use of toilet and amb with RW to bathroom with CGA. Pt stood at toilet to void with CGA. Pt returned to room and stood at sink to wash hands. Nustep level 3 for 5 mins. Pt with minimal SOB. Squat pivot transfers without AD with CGA. Pt returned to room and remained in w/c. Belt alarm actiated. All needs wihtin reach.   Therapy Documentation Precautions:  Precautions Precautions: Fall Precaution Comments: Lifevest Restrictions Weight Bearing Restrictions: No   Pain:  Pt c/o Lt knee discomfort with activity; resolved with continued activity   Therapy/Group: Individual Therapy  Leroy Libman 06/17/2021, 12:04 PM

## 2021-06-17 NOTE — Patient Care Conference (Signed)
Inpatient RehabilitationTeam Conference and Plan of Care Update Date: 06/17/2021   Time: 11:35 AM    Patient Name: Ross [name]      Medical Record Number: [id number]  Date of Birth: [birth year] Sex: Male         Room/Bed: 4W10C/4W10C-01 Payor Info: Payor: Marine scientist / Plan: UHC MEDICARE / Product Type: *No Product type* /    Admit Date/Time:  06/12/2021  3:38 PM  Primary Diagnosis:  Anoxic brain injury [facility])  Hospital Problems: Principal Problem:   Anoxic brain injury ([place]) Active Problems:   Persistent atrial fibrillation ([place])   Cardiac arrest ([place])   Pressure injury of skin   Skin necrosis [facility])    Expected Discharge Date: Expected Discharge Date: 06/27/21  Team Members Present: Physician leading conference: Dr. Courtney Heys Care Coodinator Present: Ovidio Kin, LCSW;Yanis Juma Creig Hines, RN, BSN, [place] Nurse Present: Dorthula Nettles, RN PT Present: Other (comment) Ailene Rud, PT) OT Present: Lillia Corporal, OT SLP Present: Charolett Bumpers, SLP PPS Coordinator present : Gunnar Fusi, SLP     Current Status/Progress Goal Weekly Team Focus  Bowel/Bladder   Patient is continent of B/B/, LBM 06/14/21,currently on schedule and prn medication ( Senna an Myralax  Maintain continence, regularity of is toileting pattern  Assess QS/PRN,   Swallow/Nutrition/ Hydration   dys 3 textures and thin liquids, intermittment supervision  Mod I  base of tongue/pharyngeal wall strengthing strategies, carrover of swallow strategies   ADL's   sink level bathing Min A for LLE only d/t stiffness/pain, ambulating CGA ADL transfers, Min A LB dress for LLE, flat affect  Supervision  standing balance/tolerance, AE training for LLE, family training, global endurance, problem solving/cognition during ADL, safety awareness   Mobility   bed mobility supervision, gait min/CGA up to 360 ft, stairs min A laterally  supervision  balance, gait, endurance, problem solving, stairs    Communication             Safety/Cognition/ Behavioral Observations  Min A  Supervision A  complex problem solving, error awareness and recall   Pain   Pain to left leg/back, chest region,medicated with Norco as ordered, has prn Tylenol and other schedule prn meds ( see MAR0  PAIN < 2  Assess  QS/PRN   Skin   Pressure wound LLE and mid back with large purplish/ red areas, Left foot and LLE  wound areas  Prevent of infection and tissue damage  Dressing change daily to wounds, Assess QS.PRN for S/S of infection,     Discharge Planning:  Unsure of plan daughter aware will need supervision at [place] and fiance works. may need to go to daughter's in [facility]   Team Discussion: Started Linzess prn, wounds looking better, more sloughing off skin with  granulating tissue underneath. Continent B/B, 8/10 pain to left side of body. Wounds are improving, leg wound still having some drainage. [place] vendor to do education today.  Patient on target to meet rehab goals: yes, walking to/from bathroom with contact guard assist. Sink level bathing ONLY! Lifevest does not come off! Flat affect. Supervision goals. Contact guard to min assist for longer distances. Possibility of 14 stairs depending on discharge location. Supervision goals. SLP reports has basic tongue strength, alternating solids and liquids, has very low frustration tolerance.  *See Care Plan and progress notes for long and short-term goals.   Revisions to Treatment Plan:  MD making medication adjustments.  Teaching Needs: Family education, medication management, pain management, bowel management, Lifevest education, skin/wound care,  transfer training, gait training, balance training, endurance training, stair training, safety awareness.  Current Barriers to Discharge: Decreased caregiver support, Medical stability, Home enviroment access/layout, Wound care, Lack of/limited family support, Weight, Weight bearing restrictions, Medication  compliance, Behavior, and Nutritional means  Possible Resolutions to Barriers: Continue current medications, provide emotional support.     Medical Summary Current Status: Life vest- - nerve pain better controlled with gabapentin 300 mg QHS; skin necrosis from pressors LUE/LLE- but is healing some; having muscles spasms as welll- continent B/B; very flat  Barriers to Discharge: Other (comments);Behavior;Decreased family/caregiver support;Home Dentist;Medical stability;Weight;Weight bearing restrictions;Wound care  Barriers to Discharge Comments: GF lives here but works- daughter in [place] - not sure where going- VERY flat- poor endurance- needs to get up 14 stairs at 1 place. Possible Resolutions to [employer] Focus: upset quickly/poor frustration tolerance;  but flat overall; goals Supervision PT/OT/ SLP-   did MBS this AM- mild residue-staying D3 thin liquids- no GERD;  focus on esophageal issues; on spasms- started robaxin prn; Linzess prn- d/c 7/8   Continued Need for Acute Rehabilitation Level of Care: The patient requires daily medical management by a physician with specialized training in physical medicine and rehabilitation for the following reasons: Direction of a multidisciplinary physical rehabilitation program to maximize functional independence : Yes Medical management of patient stability for increased activity during participation in an intensive rehabilitation regime.: Yes Analysis of laboratory values and/or radiology reports with any subsequent need for medication adjustment and/or medical intervention. : Yes   I attest that I was present, lead the team conference, and concur with the assessment and plan of the team.   Cristi Loron 06/17/2021, 6:04 PM

## 2021-06-17 NOTE — Progress Notes (Signed)
Physical Therapy Session Note  Patient Details  Name: Ross [name] MRN: [id number] Date of Birth: September 08, 1956  Today's Date: 06/17/2021 PT Missed Time: 34 Minutes Missed Time Reason: Patient fatigue;Pain  Pt received as hand-off from primary PT. Pt R side lying in bed discussing that he had a moment of increased anxiety while ambulating with primary PT - pt reports he believes he started feeling this way due to not feeling as confident with his balance while walking with tennis shoes on and feeling overwhelmed by the idea of ambulating all the way to the therapy gym. Therapist made primary PT aware of pt concerns. Therapist provided pt with emotional support and discussed options of having a wheelchair follow to decrease pt's anxiety with ambulating long distances and did educate pt on importance of performing gait training while wearing shoes in preparation for D/C. Pt requesting to rest at this time reporting 8/10 pain "all over" (unable to specify details of pain despite follow-up questioning) and requesting anxiety medications - RN notified. Pt declining participation in therapy at this time. Pt left in bed with needs in reach and bed alarm on.  Missed 45 minutes of skilled physical therapy.   Tawana Scale , PT, DPT, CSRS 06/17/2021, 3:13 PM

## 2021-06-17 NOTE — Progress Notes (Signed)
Patient resting at interval throughout shift, currently sitting up a bedside abd verbalizes discomfort of pain in bilateral legs and feet, chest soreness and back discomfort rating pain 8/10 on pain scale medicated with PRN medication and monitor. Continue regime

## 2021-06-18 DIAGNOSIS — J449 Chronic obstructive pulmonary disease, unspecified: Secondary | ICD-10-CM | POA: Diagnosis not present

## 2021-06-18 DIAGNOSIS — E1140 Type 2 diabetes mellitus with diabetic neuropathy, unspecified: Secondary | ICD-10-CM | POA: Diagnosis not present

## 2021-06-18 DIAGNOSIS — I2510 Atherosclerotic heart disease of native coronary artery without angina pectoris: Secondary | ICD-10-CM | POA: Diagnosis not present

## 2021-06-18 DIAGNOSIS — I10 Essential (primary) hypertension: Secondary | ICD-10-CM | POA: Diagnosis not present

## 2021-06-18 DIAGNOSIS — E1159 Type 2 diabetes mellitus with other circulatory complications: Secondary | ICD-10-CM | POA: Diagnosis not present

## 2021-06-18 DIAGNOSIS — E7800 Pure hypercholesterolemia, unspecified: Secondary | ICD-10-CM | POA: Diagnosis not present

## 2021-06-18 DIAGNOSIS — M170 Bilateral primary osteoarthritis of knee: Secondary | ICD-10-CM | POA: Diagnosis not present

## 2021-06-18 DIAGNOSIS — I119 Hypertensive heart disease without heart failure: Secondary | ICD-10-CM | POA: Diagnosis not present

## 2021-06-18 DIAGNOSIS — I5042 Chronic combined systolic (congestive) and diastolic (congestive) heart failure: Secondary | ICD-10-CM | POA: Diagnosis not present

## 2021-06-18 DIAGNOSIS — I4891 Unspecified atrial fibrillation: Secondary | ICD-10-CM | POA: Diagnosis not present

## 2021-06-18 LAB — GLUCOSE, CAPILLARY: Glucose-Capillary: 96 mg/dL (ref 70–99)

## 2021-06-18 NOTE — Progress Notes (Signed)
Occupational Therapy Session Note  Patient Details  Name: Ross [name] MRN: [id number] Date of Birth: 20-Jul-1956  Today's Date: 06/18/2021 OT Individual Time: 1430-1539 OT Individual Time Calculation (min): 69 min    Short Term Goals: Week 1:  OT Short Term Goal 1 (Week 1): Pt will complete an ambulatory toilet transfer using LRAD and no more than Min A OT Short Term Goal 2 (Week 1): Pt will complete LB dressing with Min A using adaptive strategies as needed OT Short Term Goal 3 (Week 1): Pt will complete 1 grooming task while standing at the sink for more than 10 sec to increase functional standing endurance   Skilled Therapeutic Interventions/Progress Updates:    Pt greeted at time of session sitting EOB finishing up med pass with nursing. At beginning of session pt had med pass, vitals check by NT, and meals order taken by dietary prior to beginning. Pt ambulated bed > bathroom > sink with CGA/close supervision with RW and did transfer to/from toilet with no BSC same manner. Pt did void, pericare in standing with CGA before walking to sink level. Seated bathing tasks for UB only with Supervision and declined LB bathing at this time but did perform pericare after toileting. Note pt did receive phone call from home pharmacy during bathing tasks and took a few minutes during session to discuss home medication delivery. Resumed session and LB dressing with CGA threading LLE first and able to perform figure four today which is an improvement. Wheelchair transport room <> day room and performed 1x10 chest press with lightweight red ball. Transported back to room, set up alarm on call bell in reach. Note many interruptions during session and pt requiring time to transition between tasks/personal.   Therapy Documentation Precautions:  Precautions Precautions: Fall Precaution Comments: Lifevest Restrictions Weight Bearing Restrictions: (P) No     Therapy/Group: Individual Therapy  Viona Gilmore 06/18/2021, 7:24 AM

## 2021-06-18 NOTE — Progress Notes (Signed)
Arrived to evaluate patient today to check on arm and leg wound. Patient was working with rehab. Will return later or tomorrow to evaluate.  Have been following along via EMR photos.

## 2021-06-18 NOTE — Progress Notes (Signed)
Physical Therapy Session Note  Patient Details  Name: Ross [name] MRN: [id number] Date of Birth: [birth year]  Today's Date: 06/18/2021 PT Individual Time: 1000-1100 PT Individual Time Calculation (min): 60 min   Short Term Goals: Week 1:  PT Short Term Goal 1 (Week 1): Pt will perform bed to/from wc transfers w/LRAD and cga PT Short Term Goal 2 (Week 1): Pt will perform supine to/from sit w/min to cga PT Short Term Goal 3 (Week 1): Pt will ambulate 161ft w/LRAD and cga PT Short Term Goal 4 (Week 1): Pt will ascend/descend 8 stairs w/one rail w/min assist  Skilled Therapeutic Interventions/Progress Updates:    pt received in bed and agreeable to therapy. Pt performed STS transfers with CGA throughout session. Pt directed in gait x 144ft with RW and CGA. After a seated rest break, he continued to ambulate with RW x 20 ft, with HHA x 10 ft, and with no AD x 10 ft. Pt then performed step taps with CGA and no AD, without LOB. Pt expressed that he was anxious about falling, but began to feel comfortable with copious reassurance and explanation. Pt participated in tossing a lightweight ball x 2 min with CGA for dynamic balance and confidence. Pt returned to recliner after session and was left with all needs in reach and bed alarm active.   Therapy Documentation Precautions:  Precautions Precautions: Fall Precaution Comments: Lifevest Restrictions Weight Bearing Restrictions: (P) No     Therapy/Group: Individual Therapy  Mickel Fuchs 06/18/2021, 1:03 PM

## 2021-06-18 NOTE — Progress Notes (Addendum)
Physical Therapy Session Note  Patient Details  Name: Ross [name] MRN: [id number] Date of Birth: 1956-03-30  Today's Date: 06/18/2021 PT Individual Time: 4132-4401 PT Individual Time Calculation (min): 40 min   Short Term Goals: Week 1:  PT Short Term Goal 1 (Week 1): Pt will perform bed to/from wc transfers w/LRAD and cga PT Short Term Goal 2 (Week 1): Pt will perform supine to/from sit w/min to cga PT Short Term Goal 3 (Week 1): Pt will ambulate 142ft w/LRAD and cga PT Short Term Goal 4 (Week 1): Pt will ascend/descend 8 stairs w/one rail w/min assist Week 2:    Week 3:     Skilled Therapeutic Interventions/Progress Updates:   Pain:  Pt reports L knee pain descending stairs pain.  Treatment to tolerance.  Rest breaks and repositioning as needed.  Pt initially supine and once encouraged, agreeable to treatment session w/focus on stairs, education. stand pivot transfer to wc w/cues for safety, cga, no AD. Transported to gym for stair training.  Educated pt on importance of warm up due to cardiac hx. Sit to stand to RW w/cga. Gait 164ft w/RW cga. Stairs:  ascended/descended 4 stairs w/single rail ascended step over step w/flexed posture, aduous effort, descended via lateral approach w/cga, L knee pain descending.  Ascended step to, descended lateral approach, x 8 stairs cga.  HR 90 following activity.  Repeated stairs third 4th trial, performs as second trial, cga.  Pt transported back to room. Set up at sink to brush teeth w/set up only from wc level. Wc to bed w/cga , no AD, use of bedrails.  Sit to supine w/supervision, additional time. Pt left supine w/rails up x 3, alarm set, bed in lowest position, and needs in reach.  Therapy Documentation Precautions:  Precautions Precautions: Fall Precaution Comments: Lifevest Restrictions Weight Bearing Restrictions: (P) No    Therapy/Group: Individual Therapy Callie Fielding, [place] 06/18/2021, 2:03  PM

## 2021-06-18 NOTE — Progress Notes (Signed)
[employer] LifeVest wearable cardioverter defibrillator(WCD) inservice and  demonstration  provided by company rep at bedside to patient and family member.

## 2021-06-18 NOTE — Progress Notes (Signed)
PROGRESS NOTE   Subjective/Complaints:  Pt hasn't asked for Robaxin prn or Linzess- didn't ask for either- didn't know had to ask for them.  LBM? Doesn't remember when it last was- thinks 3 days.  C/O being sleepy this AM.   ROS:  Pt denies SOB, abd pain, CP, N/V/C/D, and vision changes   Objective:   No results found. No results for input(s): WBC, HGB, HCT, PLT in the last 72 hours.  Recent Labs    06/16/21 0531  NA 136  K 5.0  CL 109  CO2 22  GLUCOSE 135*  BUN 46*  CREATININE 2.29*  CALCIUM 10.6*    Intake/Output Summary (Last 24 hours) at 06/18/2021 1643 Last data filed at 06/18/2021 1303 Gross per 24 hour  Intake 951 ml  Output --  Net 951 ml     Pressure Injury 06/12/21 Back Mid;Left Stage 2 -  Partial thickness loss of dermis presenting as a shallow open injury with a red, pink wound bed without slough. Left side mid back, stage 2, layer of skin missing, covered with foam dressing, area pink. (Active)  06/12/21 1600  Location: Back  Location Orientation: Mid;Left  Staging: Stage 2 -  Partial thickness loss of dermis presenting as a shallow open injury with a red, pink wound bed without slough.  Wound Description (Comments): Left side mid back, stage 2, layer of skin missing, covered with foam dressing, area pink.  Present on Admission: Yes    Physical Exam: Vital Signs Blood pressure 134/67, pulse 62, temperature 98.2 F (36.8 C), temperature source Oral, resp. rate 17, height 6\' 4"  (1.93 m), weight 109.5 kg, SpO2 98 %.      General: awake, alert, appropriate, sitting EOB; sitting up in table; NAD HENT: conjugate gaze; oropharynx moist CV: regular rate; no JVD Pulmonary: CTA B/L; no W/R/R- good air movement GI: soft, NT, ND, (+)BS Psychiatric: appropriate; but sleepy; flat affect Neurological: alert  Musculoskeletal:        General: trace LE present.    Cervical back: Normal range of  motion and neck supple.    Comments: RUE- 5/5- in all muscles checked from proximal to distal LUE_ at least 3+/5- cannot tolerate any pressure on LUE to test strength further RLE- 5/5- in all muscle tested LLE- at least 3/5 in HF, and KF/KE- DF and PF at least 4/5- appears slightly less than RLE Decreased ROM of L knee- hard to tell if muscular/skin related (due to skin necrosis/pain) or if actually has lost ROM - cannot flex L knee to 90 degrees.    Skin:    Comments: Trace LLE edema and L hand edema- not pitting- no change Dressing on LUE/LLE C/D/I Very deep purple where blisters have denuded- with some associated necrosis. On both limbs- has kerlex dressings in place C/D/I L leg and forearm R forearm - looks OK Skin tear L thoracic area- no skin breakdown seen on backside Missing 2 front teeth.  Has partial plate Neurological:.    Comments: Slow and measured. Able to follow simple commands.   Delayed to respond to questions and to ask questions- significant delay noted Tremor in LUE with movement/reaching- intention tremor  Assessment/Plan: 1. Functional deficits which require 3+ hours per day of interdisciplinary therapy in a comprehensive inpatient rehab setting. Physiatrist is providing close team supervision and 24 hour management of active medical problems listed below. Physiatrist and rehab team continue to assess barriers to discharge/monitor patient progress toward functional and medical goals  Care Tool:  Bathing    Body parts bathed by patient: Right arm, Left arm, Chest, Abdomen, Front perineal area, Right upper leg, Left upper leg, Buttocks, Right lower leg, Face   Body parts bathed by helper: Left lower leg     Bathing assist Assist Level: Minimal Assistance - Patient > 75%     Upper Body Dressing/Undressing Upper body dressing   What is the patient wearing?: Pull over shirt    Upper body assist Assist Level: Supervision/Verbal cueing    Lower  Body Dressing/Undressing Lower body dressing      What is the patient wearing?: Pants     Lower body assist Assist for lower body dressing: Contact Guard/Touching assist     Toileting Toileting    Toileting assist Assist for toileting: Contact Guard/Touching assist Assistive Device Comment: urinal   Transfers Chair/bed transfer  Transfers assist     Chair/bed transfer assist level: Contact Guard/Touching assist     Locomotion Ambulation   Ambulation assist      Assist level: Contact Guard/Touching assist Assistive device: Walker-rolling Max distance: 360   Walk 10 feet activity   Assist     Assist level: Contact Guard/Touching assist Assistive device: Walker-rolling   Walk 50 feet activity   Assist    Assist level: Contact Guard/Touching assist Assistive device: Walker-rolling    Walk 150 feet activity   Assist Walk 150 feet activity did not occur: Safety/medical concerns  Assist level: Contact Guard/Touching assist Assistive device: Walker-rolling    Walk 10 feet on uneven surface  activity   Assist Walk 10 feet on uneven surfaces activity did not occur: Safety/medical concerns (could not tolerate assessment due to fatigue)         Wheelchair     Assist Will patient use wheelchair at discharge?: No             Wheelchair 50 feet with 2 turns activity    Assist            Wheelchair 150 feet activity     Assist          Blood pressure 134/67, pulse 62, temperature 98.2 F (36.8 C), temperature source Oral, resp. rate 17, height 6\' 4"  (1.93 m), weight 109.5 kg, SpO2 98 %.  Medical Problem List and Plan: 1.  Anoxic brain injury secondary to cardiac arrest with CPR >90 minutes! S/P pressors with LUE/LLE wounds             -patient may not shower due to life vest-              -ELOS/Goals: 2-2.15 weeks- supervision to mod I- needs PT, OT and SLP- pt doesn't have insight into memory/cognitive issues.   Continue CIR- PT, OT and SLP Will allow pt to go on outdoors therapeutic pass tomorrow- if OK with PT/OT.  2.  Antithrombotics: -DVT/anticoagulation:  Pharmaceutical: Other (comment) --Eliquis.             -antiplatelet therapy: ASA 3. Chest wall pain: Continue Oxycodone prn for chest pain- hx of taking oxy at home chronically, but stopped prior to admission due to constipation.   6/28- loves Gabapentin 300 mg QHS for  nerve pain- con't regimen 4. Mood: LCSW to follow for evaluation and support.              -antipsychotic agents: N/A 5. Neuropsych: This patient is? capable of making decisions on his own behalf. 6. Skin/Wound Care: local care to denuded blisters.             --continue Vitamin C. Will add protein supplements to promote wound healing.   6/28- will reassess today after rounds- will go see- nurse to [facility] me.  7. Fluids/Electrolytes/Nutrition: Monitor I/O. Check lytes in am. 8. CAD/ICM: Treated medically --avoid nephrotoxic meds             --On BIDIL, coreg, ASA, Lipitor.             --monitor for symptoms with increase in activity.  9. Left forearm/left shin ulcers due to blistering/pressors/necrosis: Continue xeroform with dry dressing changes daily.  6/24- assessed this AM- look better than expected- con't wound care- will con't to monitor 10. Constipation:On Senna and miralax-->will increase miralax to bid. Senna increased to BID  6/29- added Linzess prn- and con't regimen 11. Neuropathy: Will start low dose gabapentin at nights and titrate upwards as tolerated.             --had psychosis with Lyrica.  6/28- will con't gabapentin 300 mg qhs- [employer] 12. Hyperglycemia:  Hgb A1C- 5.6: decrease CBGs to daily.  13. PAF: Monitor HR tid--continue amiodarone, Coreg and Eliquis 14. VT/VF: LifeVest in place--will need continued education on management.             --Amiodarone 400 mg bid-->100 mg on 06/29  6/24- advised pt not to remove Life Vest- wrote order to do  same  6/27- can change out with other life vest- let nursing know to not take off, but exchange actual vest.   6/28- Life vest people are coming today to see if can change out actual vest so can clean the other life vest, if possible.  15. AKI: Resolving. Continue to avoid nephrotoxic drugs and monitor for recovery.   6/27- Cr down to 2.29- is continuing  to improve- will monitor  6/28- will recheck Thursday 16. COPD: Stable without MDI at this time.             --monitor for symptoms with increase in activity. 17. sCHF- with EF of 25%- will monitor daily weights and monitor  6/28- weights have gone down from max of 113 kg- down to 109.6 kg- con't to monitor 18. Left thumb pain/weakness: discussed prognosis, ordered ice to forearm, lidocaine jelly to thumb. 19. Constipation 6/28- hx of using Linzess prn- LBM 2 days ago- will restart Linzess PRN- so can take if no BM for 2-3 days/feels constipated- take before breakfast.  6/29- if no BM by tomorrow, will give Sorbitol.     LOS: 6 days A FACE TO FACE EVALUATION WAS PERFORMED  Ross [name] 06/18/2021, 4:43 PM

## 2021-06-18 NOTE — Progress Notes (Signed)
Speech Language Pathology Daily Session Note  Patient Details  Name: Ross [name] MRN: [id number] Date of Birth: 1956/01/23  Today's Date: 06/18/2021 SLP Individual Time: 0802-0830 SLP Individual Time Calculation (min): 28 min  Short Term Goals: Week 1: SLP Short Term Goal 1 (Week 1): Pt will tolerate Dys3/thin diet with minimal s/s aspiration and dysphagia with Supervision level cues for swallow strategies SLP Short Term Goal 2 (Week 1): Pt will participate in MBSS to assess anatomy and physiology of swallow SLP Short Term Goal 3 (Week 1): Pt will complete med/money management tasks with min A SLP Short Term Goal 4 (Week 1): Pt will complete moderately complex problem solving tasks with min A SLP Short Term Goal 5 (Week 1): Pt will recall novel information with min A  Skilled Therapeutic Interventions: Skilled ST services focused on swallow skills. SLP provided thorough education pertaining to swallow function, reviewing yesterday's MBS slides as well as swallow visuals to aid in carryover. SLP noted pt's current deficits with mild base of tongue and pharyngeal residue along with recommendation of further investigation from GI for esophageal function. SLP taught pt Masako and effortful swallow exercise to increase base of tongue and pharyngeal wall strength. Pt was able to complete x1 maskao and x3 effortful swallows, and expressed pt will try to complete a total of x10 Maskao and 3 sets of 10 effortful swallow today.  SLP also educated pt on swallow compensatory strategies such as consuming small bites/sips, alternating liquids/solids, intermittent dry swallow and remaining upright for 30-45 minutes following PO intake. All questions answered to satisfaction. Pt was left with nurse in room. Recommend to continue skilled ST services.      Pain Pain Assessment Pain Scale: 0-10 Pain Score: 0-No pain Pain Type: Acute pain Pain Location: Chest Pain Intervention(s): Medication (See  eMAR)  Therapy/Group: Individual Therapy  Leverne Tessler  [facility] 06/18/2021, 12:35 PM

## 2021-06-19 DIAGNOSIS — R079 Chest pain, unspecified: Secondary | ICD-10-CM

## 2021-06-19 DIAGNOSIS — I255 Ischemic cardiomyopathy: Secondary | ICD-10-CM

## 2021-06-19 DIAGNOSIS — I96 Gangrene, not elsewhere classified: Secondary | ICD-10-CM

## 2021-06-19 LAB — GLUCOSE, CAPILLARY: Glucose-Capillary: 147 mg/dL — ABNORMAL HIGH (ref 70–99)

## 2021-06-19 MED ORDER — SORBITOL 70 % SOLN
30.0000 mL | Freq: Once | Status: AC
  Administered 2021-06-19: 30 mL via ORAL
  Filled 2021-06-19: qty 30

## 2021-06-19 MED ORDER — WHITE PETROLATUM EX OINT
TOPICAL_OINTMENT | CUTANEOUS | Status: AC
  Administered 2021-06-19: 0.2
  Filled 2021-06-19: qty 28.35

## 2021-06-19 NOTE — Progress Notes (Addendum)
Progress Note  Patient Name: Ross [name] Date of Encounter: 06/19/2021  Primary Cardiologist: Sinclair Grooms, MD   Subjective   Seen while riding the recumbent bike with PT in CIR. No active chest pain with biking or ambulation, however seems to be mod/severe with palpation. Denies SOB, palpitations.   Inpatient Medications    Scheduled Meds:  amiodarone  200 mg Oral Daily   apixaban  5 mg Oral BID   vitamin C  500 mg Oral BID   aspirin  81 mg Oral Daily   atorvastatin  80 mg Oral Daily   camphor-menthol   Topical TID   carvedilol  6.25 mg Oral BID WC   gabapentin  300 mg Oral QHS   isosorbide-hydrALAZINE  1.5 tablet Oral TID   mouth rinse  15 mL Mouth Rinse BID   nutrition supplement (JUVEN)  1 packet Oral BID BM   pantoprazole  40 mg Oral Daily   polyethylene glycol  17 g Oral BID   senna-docusate  2 tablet Oral QHS   sorbitol  30 mL Oral Once   white petrolatum       Continuous Infusions:  PRN Meds: acetaminophen, ALPRAZolam, alum & mag hydroxide-simeth, bisacodyl, camphor-menthol, diphenhydrAMINE, guaiFENesin-dextromethorphan, ipratropium-albuterol, lidocaine, linaclotide, melatonin, methocarbamol, oxyCODONE-acetaminophen, polyethylene glycol, prochlorperazine **OR** prochlorperazine **OR** prochlorperazine, simethicone, sodium chloride, sodium phosphate, traZODone   Vital Signs    Vitals:   06/18/21 1453 06/18/21 1950 06/19/21 0437 06/19/21 0500  BP: 134/67 131/67 140/68   Pulse: 62 73 62   Resp: 17 16 14    Temp: 98.2 F (36.8 C) 98.6 F (37 C) 97.7 F (36.5 C)   TempSrc: Oral Oral Oral   SpO2: 98% 96% 96%   Weight:    115.2 kg  Height:        Intake/Output Summary (Last 24 hours) at 06/19/2021 1358 Last data filed at 06/19/2021 0801 Gross per 24 hour  Intake 500 ml  Output --  Net 500 ml   Filed Weights   06/17/21 0529 06/18/21 0440 06/19/21 0500  Weight: 109.6 kg 109.5 kg 115.2 kg    Physical Exam   General: Well developed, well  nourished, NAD Neck: Negative for carotid bruits. No JVD Lungs:Clear to ausculation bilaterally. Breathing is unlabored. Cardiovascular: RRR with S1 S2. No murmurs. Significant chest wall tenderness on palpation.   Abdomen: Soft, non-tender, non-distended. No obvious abdominal masses. Extremities: No edema.  Neuro: Alert and oriented. Poor memory. Psych: Responds to questions appropriately with normal affect.    Labs    Chemistry Recent Labs  Lab 06/13/21 0502 06/16/21 0531  NA 136 136  K 4.4 5.0  CL 110 109  CO2 20* 22  GLUCOSE 102* 135*  BUN 42* 46*  CREATININE 2.62* 2.29*  CALCIUM 10.1 10.6*  PROT 5.5*  --   ALBUMIN 2.9*  --   AST 19  --   ALT 35  --   ALKPHOS 74  --   BILITOT 0.5  --   GFRNONAA 26* 31*  ANIONGAP 6 5     Hematology Recent Labs  Lab 06/13/21 0502  WBC 11.6*  RBC 3.32*  HGB 10.5*  HCT 31.7*  MCV 95.5  MCH 31.6  MCHC 33.1  RDW 13.3  PLT 432*    Cardiac EnzymesNo results for input(s): TROPONINI in the last 168 hours. No results for input(s): TROPIPOC in the last 168 hours.   BNPNo results for input(s): BNP, PROBNP in the last 168 hours.  DDimer No results for input(s): DDIMER in the last 168 hours.   Radiology    No results found.  Telemetry    Not currently on telemetry - Personally Reviewed  ECG    No new EKG since 06/01/21 - Personally Reviewed  Cardiac Studies   2D echocardiogram (06/01/2021)   1. Left ventricular ejection fraction, by estimation, is 35 to 40%. The  left ventricle has moderately decreased function. The left ventricle has  no regional wall motion abnormalities. There is severe concentric left  ventricular hypertrophy. Left  ventricular diastolic parameters are indeterminate. Elevated left  ventricular end-diastolic pressure. There is akinesis of the left  ventricular, mid anteroseptal wall. There is akinesis of the left  ventricular, apical septal wall, inferior wall and  anterior wall. There is akinesis  of the left ventricular, apical segment.   2. Right ventricular systolic function was not well visualized. The right  ventricular size is mildly enlarged. Tricuspid regurgitation signal is  inadequate for assessing PA pressure.   3. Right atrial size was severely dilated.   4. The mitral valve is normal in structure. Trivial mitral valve  regurgitation. No evidence of mitral stenosis.   5. The aortic valve is tricuspid. Aortic valve regurgitation is not  visualized. Mild aortic valve sclerosis is present, with no evidence of  aortic valve stenosis.   6. Aortic dilatation noted. There is mild dilatation of the aortic root,  measuring 41 mm.   7. The inferior vena cava is dilated in size with >50% respiratory  variability, suggesting right atrial pressure of 8 mmHg.     Cardiac catheterization (05/31/2021)   Prox RCA to Mid RCA lesion is 99% stenosed. Dist RCA lesion is 80% stenosed. Ramus lesion is 100% stenosed. Origin to Prox Graft lesion is 100% stenosed. Origin lesion is 100% stenosed. Prox LAD lesion is 70% stenosed. Prox LAD to Mid LAD lesion is 70% stenosed. Mid LAD-1 lesion is 90% stenosed. Mid LAD-2 lesion is 80% stenosed.   VF cardiac arrest with prolonged resuscitative effort and ultimate restoration of paced rhythm following multiple shocks, amiodarone, and Levophed infusion.   Severe native CAD with diffuse severe proximal LAD stenoses of 70 to 80% with focal 90% stenosis between the first and second septal perforating artery with total occlusion of the first diagonal vessel and a flush and fill phenomena of the mid LAD secondary to competitive filling via the LIMA graft.   Old occlusion of the ramus immediate vessel and obtuse marginal vessel   Dominant RCA with previously noted  diffuse 90% mid stenosis which now has right to right collateralization and diffuse 80% distal stenosis.   Patent LIMA graft supplying the mid LAD.   Patent SVG supplying the first diagonal  vessel.   Old occlusion of the vein graft to a ramus intermediate vessel.   Old occlusion of the vein graft which had supplied the distal RCA.   LVEDP 20 mmHg   RECOMMENDATION: Patient is admitted to the critical care service.  We will plan 2D echo Doppler study in a.m.  He has been seen by Dr. Rayann Heman in the emergency room during his prolonged resuscitative efforts today.  Guideline directed medical therapy for CHF.  Anticipate ICD implantation per electrophysiology.   Coronary Diagrams     Diagnostic Dominance: Right       Patient Profile     65 y.o. male with known ischemic CM (EF 25%), s/p prior CABG, permanent afib, and DM who was admitted with VF arrest  underwent prolonged resuscitative efforts with ultimate ROSC.  Assessment & Plan    1. S/p VF arrest with prior CABG:  -Pt presented to [facility] after a witnessed cardiac arrest s/p defibrillation x10 and CPR with ROSC. He was intubated and transported to [facility]. Cardiac catheterization performed by Dr. Claiborne Billings 05/31/21 at the time of presentation showed patent SVG to ramus, patent LIMA ro LAD and occluded SVG to RCA with occluded native RCA and occluded SVG to diagonal branch.  -Medical therapy was recommendation as there were no culprit  -He was treated initially with IV Amiodarone which was transitioned to PO dosing 400mg  BID amiodarone for total of 2 weeks, now down to 200mg  daily -Continue ASA, statin, carvedilol   2. Ischemic cardiomyopathy: -LVEF previously in the 30% range with subsequent echo this admission at 35-40%. He has tolerated GDMT therefore he was restarted on carvedilol prior to d/c to CIR -Unable to add ACE/ARB/spiro due to renal dysfunction however may be added at a later time -Creatinine remains elevated at 2.29>>peak at 6.78  3. Atrial fibrillation: -Has a known hx of permanent atrial fibrillation on Eliquis 5mg  BID  -Continue amiodarone 200mg  PO   4. Secondary AV block: -Has hx of permanent transvenous  pacemaker in the past. Plan is for CRT upgrad at some point in the future per Dr. Curt Bears however this is currently on hold due to skin necrosis>>>plastic surgery now following  -Prior to CIR transfer, LifeVest was placed with plans to continue until upgrade scheduled in the future  5. Skin necrosis: -s/p norepinephrine infiltration  -Plastic surgery following   6. Acute on chronic kidney disease stage IV: -Creatinine peak at 6.78>>>down trended to 2.29 from 06/16/21  7. HTN: -Stable, 119/60>>140/68>>>131/67 -Continue carvedilol, Bidil 20/37.5  8. HLD: -Last LDL, 33 -Continue statin   9. Atypical chest pain/chest wall pain: -Pt reported chest pain when turning to the left and lying on his left side.  -Denies pain with ambulation or while working with PT on the recumbent bike -Pain reproducible with palpation and relieved with pain medication  -Denies SOB, palpitations.  -No indication for further ischemic workup at this time    Signed, Kathyrn Drown NP-C [place] Pager: [phone] 06/19/2021, 1:58 PM     For questions or updates, please contact   Please consult [email or web address] for contact info under Cardiology/STEMI.  Patient seen and examined.  Agree with above documentation.  Mr. Gabrielle is a 65 year old male with a history of recent V. fib arrest, ischemic cardiomyopathy (EF 35-40%), CAD status post CABG, permanent atrial fibrillation, T2DM who we are consulted for evaluation of chest pain.  Reported pain when turning to left or lying on left side.   No pain while exercising. Rode bicycle with PT today without symptoms.  Suspect musculoskeletal pain, likely related to chest compressions he received.  Would not recommend further ischemic evaluation at this time.   Donato Heinz, MD

## 2021-06-19 NOTE — Progress Notes (Signed)
Speech Language Pathology Daily Session Note  Patient Details  Name: Ross [name] MRN: [id number] Date of Birth: 08-16-1956  Today's Date: 06/19/2021 SLP Individual Time: 0805-0850 SLP Individual Time Calculation (min): 45 min  Short Term Goals: Week 1: SLP Short Term Goal 1 (Week 1): Pt will tolerate Dys3/thin diet with minimal s/s aspiration and dysphagia with Supervision level cues for swallow strategies SLP Short Term Goal 2 (Week 1): Pt will participate in MBSS to assess anatomy and physiology of swallow SLP Short Term Goal 3 (Week 1): Pt will complete med/money management tasks with min A SLP Short Term Goal 4 (Week 1): Pt will complete moderately complex problem solving tasks with min A SLP Short Term Goal 5 (Week 1): Pt will recall novel information with min A  Skilled Therapeutic Interventions:   Patient seen for skilled ST session focusing on dysphagia and cognitive goals. Patient requesting to use bathroom when SLP arrived and he did not demonstrate any unsafe behaviors, requested assistance as needed. SLP directed patient in completing swallow exercise that he had been previously taught [facility]). He was not able to complete with tongue extended past teeth and so although SLP observed to be putting forth effort during swallow, was not able to confirm that he had adequate lingual placement. SLP reviewed MBS results with patient, showing him on film the instances of penetration of liquids and compared to video online showing aspiration. Patient asking about risk of choking and SLP explained difference between aspiration/penetration and choking. Patient demonstrated difficulty with recall of information from previous date. He continues to benefit from skilled SLP intervention to maximize cognitive-linguistic and swallow function prior to discharge.  Pain Pain Assessment Pain Scale: 0-10 Pain Score: 7  Faces Pain Scale: Hurts a little bit Pain Type: Acute pain Pain Location:  Chest Pain Descriptors / Indicators: Grimacing;Sharp Pain Onset: Sudden Pain Intervention(s): Other (Comment) (RN giving patient medications)  Therapy/Group: Individual Therapy  Sonia Baller, MA, CCC-SLP Speech Therapy

## 2021-06-19 NOTE — Progress Notes (Signed)
Physical Therapy Session Note  Patient Details  Name: Ross [name] MRN: [id number] Date of Birth: 03/21/1956  Today's Date: 06/19/2021 PT Individual Time: 0900-1005 PT Individual Time Calculation (min): 65 min   Short Term Goals: Week 1:  PT Short Term Goal 1 (Week 1): Pt will perform bed to/from wc transfers w/LRAD and cga PT Short Term Goal 2 (Week 1): Pt will perform supine to/from sit w/min to cga PT Short Term Goal 3 (Week 1): Pt will ambulate 163ft w/LRAD and cga PT Short Term Goal 4 (Week 1): Pt will ascend/descend 8 stairs w/one rail w/min assist  Skilled Therapeutic Interventions/Progress Updates:    Pt was received from SLP at beginning of session. Pt agreeable to therapy. Pt directed in gait with RW and CGA x 200 ft with seated rest breaks. Pt reported dizziness and stated her couldn't feel his legs. Pt sat in w/c and was transported to quieter environment. Symptoms improved with rest and pt was agreeable to further exercise. Pt directed in circuit consisting of Squats, marches, ball press in standing, and STS for 1 min each. Upon second round of circuit, pt stated that he felt like his head continued to move backward after he sat down. Upon questioning, pt stated he had some dizziness when changing positions before changing medications, but they worsened when he started entresto. Pt unable to tolerate laying flat for dix hallpike test to screen for BPPV. Pt was agreeable to walking back to his room, x 200 ft with CGA and RW without rest breaks. Pt was left in bed with all needs in reach and bed alarm active.   Therapy Documentation Precautions:  Precautions Precautions: Fall Precaution Comments: Lifevest Restrictions Weight Bearing Restrictions: No General:   Vital Signs: Therapy Vitals Temp: 97.7 F (36.5 C) Temp Source: Oral Pulse Rate: 62 Resp: 14 BP: 140/68 Patient Position (if appropriate): Lying Oxygen Therapy SpO2: 96 % O2 Device: Room Air Pain:    Mobility:   Locomotion :    Trunk/Postural Assessment :    Balance:   Exercises:   Other Treatments:      Therapy/Group: Individual Therapy  Mickel Fuchs 06/19/2021, 7:57 AM

## 2021-06-19 NOTE — Progress Notes (Signed)
PROGRESS NOTE   Subjective/Complaints:  Pt reports he cannot remember when had last BM?- Based on chart, has bee 2 days, but last BM prior to that was 6+ days- so, will give another dose of Sorbitol this afternoon after therapy.   Feels like might be able to go today, but not sure.  Every time he turns to left side- even to look at TV which is on L, he feels "heart pain"- no palpitations, no SOB associated, however says it's feels like "having a heart attack" the pain is significant. Worse if lays on L side.   Called Cards to rule out cardiac causes.   ROS:  Pt denies SOB, abd pain, N/V/D, and vision changes   Objective:   No results found. No results for input(s): WBC, HGB, HCT, PLT in the last 72 hours.  No results for input(s): NA, K, CL, CO2, GLUCOSE, BUN, CREATININE, CALCIUM in the last 72 hours.   Intake/Output Summary (Last 24 hours) at 06/19/2021 0831 Last data filed at 06/19/2021 0801 Gross per 24 hour  Intake 974 ml  Output --  Net 974 ml     Pressure Injury 06/12/21 Back Mid;Left Stage 2 -  Partial thickness loss of dermis presenting as a shallow open injury with a red, pink wound bed without slough. Left side mid back, stage 2, layer of skin missing, covered with foam dressing, area pink. (Active)  06/12/21 1600  Location: Back  Location Orientation: Mid;Left  Staging: Stage 2 -  Partial thickness loss of dermis presenting as a shallow open injury with a red, pink wound bed without slough.  Wound Description (Comments): Left side mid back, stage 2, layer of skin missing, covered with foam dressing, area pink.  Present on Admission: Yes    Physical Exam: Vital Signs Blood pressure 140/68, pulse 62, temperature 97.7 F (36.5 C), temperature source Oral, resp. rate 14, height 6\' 4"  (1.93 m), weight 115.2 kg, SpO2 96 %.       General: awake, alert, appropriate, sitting up in bed- SLP in room; NAD HENT:  conjugate gaze; oropharynx moist CV: regular rate0 sounds like occ skipping beat, but doesn't sound like Afib this AM; no JVD Pulmonary: CTA B/L; no W/R/R- good air movement GI: soft, NT, ND, (+)BS- hypoactive; slightly protuberant? Psychiatric: appropriate- but very flat-  Neurological: poor STM- Alert Musculoskeletal:        General: trace LE present.    Cervical back: Normal range of motion and neck supple.    Comments: RUE- 5/5- in all muscles checked from proximal to distal LUE_ at least 3+/5- cannot tolerate any pressure on LUE to test strength further RLE- 5/5- in all muscle tested LLE- at least 3/5 in HF, and KF/KE- DF and PF at least 4/5- appears slightly less than RLE Decreased ROM of L knee- hard to tell if muscular/skin related (due to skin necrosis/pain) or if actually has lost ROM - cannot flex L knee to 90 degrees.    Skin:    Comments: Trace LLE edema and L hand edema- not pitting- no change Dressing on LUE/LLE C/D/I Very deep purple where blisters have denuded- with some associated necrosis. On both limbs-  has kerlex dressings in place C/D/I L leg and forearm R forearm - looks OK Skin tear L thoracic area- no skin breakdown seen on backside Missing 2 front teeth.  Has partial plate Neurological:.    Comments: Slow and measured. Able to follow simple commands.   Delayed to respond to questions and to ask questions- significant delay noted Tremor in LUE with movement/reaching- intention tremor          Assessment/Plan: 1. Functional deficits which require 3+ hours per day of interdisciplinary therapy in a comprehensive inpatient rehab setting. Physiatrist is providing close team supervision and 24 hour management of active medical problems listed below. Physiatrist and rehab team continue to assess barriers to discharge/monitor patient progress toward functional and medical goals  Care Tool:  Bathing    Body parts bathed by patient: Right arm, Left arm,  Chest, Abdomen, Front perineal area, Right upper leg, Left upper leg, Buttocks, Right lower leg, Face   Body parts bathed by helper: Left lower leg     Bathing assist Assist Level: Independent (offer assistance, will not let u help him)     Upper Body Dressing/Undressing Upper body dressing   What is the patient wearing?: Pull over shirt    Upper body assist Assist Level: Supervision/Verbal cueing    Lower Body Dressing/Undressing Lower body dressing      What is the patient wearing?: Pants     Lower body assist Assist for lower body dressing: Contact Guard/Touching assist     Toileting Toileting    Toileting assist Assist for toileting: Contact Guard/Touching assist Assistive Device Comment: urinal   Transfers Chair/bed transfer  Transfers assist     Chair/bed transfer assist level: Contact Guard/Touching assist     Locomotion Ambulation   Ambulation assist      Assist level: Contact Guard/Touching assist Assistive device: Walker-rolling Max distance: 360   Walk 10 feet activity   Assist     Assist level: Contact Guard/Touching assist Assistive device: Walker-rolling   Walk 50 feet activity   Assist    Assist level: Contact Guard/Touching assist Assistive device: Walker-rolling    Walk 150 feet activity   Assist Walk 150 feet activity did not occur: Safety/medical concerns  Assist level: Contact Guard/Touching assist Assistive device: Walker-rolling    Walk 10 feet on uneven surface  activity   Assist Walk 10 feet on uneven surfaces activity did not occur: Safety/medical concerns (could not tolerate assessment due to fatigue)         Wheelchair     Assist Will patient use wheelchair at discharge?: No             Wheelchair 50 feet with 2 turns activity    Assist            Wheelchair 150 feet activity     Assist          Blood pressure 140/68, pulse 62, temperature 97.7 F (36.5 C),  temperature source Oral, resp. rate 14, height 6\' 4"  (1.93 m), weight 115.2 kg, SpO2 96 %.  Medical Problem List and Plan: 1.  Anoxic brain injury secondary to cardiac arrest with CPR >90 minutes! S/P pressors with LUE/LLE wounds             -patient may not shower due to life vest-              -ELOS/Goals: 2-2.15 weeks- supervision to mod I- needs PT, OT and SLP- pt doesn't have insight into memory/cognitive issues.  Continue CIR- PT, OT and SLP Will allow pt to go on outdoors therapeutic pass tomorrow- if OK with PT/OT.  -con't PT, OT and SLP-  2.  Antithrombotics: -DVT/anticoagulation:  Pharmaceutical: Other (comment) --Eliquis.             -antiplatelet therapy: ASA 3. Chest wall pain: Continue Oxycodone prn for chest pain- hx of taking oxy at home chronically, but stopped prior to admission due to constipation.   6/28- loves Gabapentin 300 mg QHS for nerve pain- con't regimen  6/30- having L sided chest pain when turns to left side- have called Cards, just in case, with extensive recent cardiac hx.  4. Mood: LCSW to follow for evaluation and support.              -antipsychotic agents: N/A 5. Neuropsych: This patient is? capable of making decisions on his own behalf. 6. Skin/Wound Care: local care to denuded blisters.             --continue Vitamin C. Will add protein supplements to promote wound healing.   6/28- will reassess today after rounds- will go see- nurse to [facility] me.  7. Fluids/Electrolytes/Nutrition: Monitor I/O. Check lytes in am. 8. CAD/ICM: Treated medically --avoid nephrotoxic meds             --On BIDIL, coreg, ASA, Lipitor.             --monitor for symptoms with increase in activity.  9. Left forearm/left shin ulcers due to blistering/pressors/necrosis: Continue xeroform with dry dressing changes daily.  6/24- assessed this AM- look better than expected- con't wound care- will con't to monitor 10. Constipation:On Senna and miralax-->will increase miralax to  bid. Senna increased to BID  6/29- added Linzess prn- and con't regimen  6/20- will give Sorbitol 60 cc after therapy today 11. Neuropathy: Will start low dose gabapentin at nights and titrate upwards as tolerated.             --had psychosis with Lyrica.  6/28- will con't gabapentin 300 mg qhs- [employer] 12. Hyperglycemia:  Hgb A1C- 5.6: decrease CBGs to daily.  13. PAF: Monitor HR tid--continue amiodarone, Coreg and Eliquis 14. VT/VF: LifeVest in place--will need continued education on management.             --Amiodarone 400 mg bid-->100 mg on 06/29  6/24- advised pt not to remove Life Vest- wrote order to do same  6/27- can change out with other life vest- let nursing know to not take off, but exchange actual vest.   6/28- Life vest people are coming today to see if can change out actual vest so can clean the other life vest, if possible.   6/30- having chest pain when turns to left- no SOB/palpitations- will call Cards to make sure not cardiac.  15. AKI: Resolving. Continue to avoid nephrotoxic drugs and monitor for recovery.   6/27- Cr down to 2.29- is continuing  to improve- will monitor  6/30- will recheck Friday.  16. COPD: Stable without MDI at this time.             --monitor for symptoms with increase in activity. 17. sCHF- with EF of 25%- will monitor daily weights and monitor  6/28- weights have gone down from max of 113 kg- down to 109.6 kg- con't to monitor  6/30- weight up significantly to 115 kg, but changed beds- could be cause, vs change in weight 6 kg! 18. Left thumb pain/weakness: discussed prognosis, ordered ice  to forearm, lidocaine jelly to thumb. 19. Constipation 6/28- hx of using Linzess prn- LBM 2 days ago- will restart Linzess PRN- so can take if no BM for 2-3 days/feels constipated- take before breakfast.  6/29- if no BM by tomorrow, will give Sorbitol. 6/30- ordered Sorbitol after therapy.      LOS: 7 days A FACE TO FACE EVALUATION WAS  PERFORMED  Nori Winegar 06/19/2021, 8:31 AM

## 2021-06-19 NOTE — Progress Notes (Signed)
Occupational Therapy Session Note  Patient Details  Name: Ross [name] MRN: [id number] Date of Birth: 1956/03/10  Today's Date: 06/19/2021 OT Individual Time: 2878-6767 OT Individual Time Calculation (min): 29 min    Short Term Goals: Week 1:  OT Short Term Goal 1 (Week 1): Pt will complete an ambulatory toilet transfer using LRAD and no more than Min A OT Short Term Goal 2 (Week 1): Pt will complete LB dressing with Min A using adaptive strategies as needed OT Short Term Goal 3 (Week 1): Pt will complete 1 grooming task while standing at the sink for more than 10 sec to increase functional standing endurance   Skilled Therapeutic Interventions/Progress Updates:    Pt received supine in bed, agreeable to OT, reporting no pain. Flat affect demoed throughout session. Per pt request, education provided on role of OT. Sup>sit close spvsn with use of bed rails. Sit<>stand CGA with RW. No vc's for safety or hand placement. Pt ambulated with RW bed>toilet>w/c close spvsn-CGA for balance. Completed 3/3 toileting CGA (continent of bladder). Washed hands set up A. Pt requested to wash hair in sink using shampoo tray. Pt engaged in grooming task (shaving) seated in w/c at sink with lower cabinet doors opened for closer reach of sink set up. Due to time constraints, pt remained seated in w/c shaving, RN notified and in agreement that she would stand at door and check on pt until he was ready for transfer back to bed.   Therapy Documentation Precautions:  Precautions Precautions: Fall Precaution Comments: Lifevest Restrictions Weight Bearing Restrictions: No  Pain: Pain Assessment Pain Scale: 0-10 Pain Score:  (pain unrated, did not interfere with session)   Therapy/Group: Individual Therapy  Mellissa Kohut 06/19/2021, 1:02 PM

## 2021-06-19 NOTE — Progress Notes (Signed)
Subjective: 65 year old male with left lower extremity and left upper extremity wound after extravasation of Levophed.  Patient reports he is having pain to his decree sensation to left thumb and left second finger.  He reports no issues with range of motion of his left wrist or hand.  He reports no issues with range of motion of his left ankle.   Objective: Vital signs in last 24 hours: Temp:  [97.7 F (36.5 C)-98.6 F (37 C)] 97.7 F (36.5 C) (06/30 0437) Pulse Rate:  [62-73] 62 (06/30 0437) Resp:  [14-17] 14 (06/30 0437) BP: (131-140)/(67-68) 140/68 (06/30 0437) SpO2:  [96 %-98 %] 96 % (06/30 0437) Weight:  [086.5 kg] 115.2 kg (06/30 0500) Last BM Date: 06/17/21  Intake/Output from previous day: 06/29 0701 - 06/30 0700 In: 1011 [P.O.:1011] Out: -  Intake/Output this shift: Total I/O In: 200 [P.O.:200] Out: -   General appearance: alert, no distress, and ill-appearing, guarded Extremities:   Left lower extremity shin/distal knee with wound after extravasation.  Was able to remove superficial sloughing epithelium today.  There appears to be near full-thickness wound with waxy white tissue noted centrally.  There is no surrounding cellulitic changes.  There is no foul odors.  Tenderness to palpation.  There is minimal swelling noted.  At the periphery there is new epithelium.   Proximal inner left thigh scab was debrided using pickups and scissors which revealed a healthy base of new epithelium.  Centrally he does have some sloughing fibrinous exudate.  There is no surrounding cellulitic changes or erythema noted.   Left upper extremity: Left upper extremity wound with dried scabbing epithelium at the edges.  He does have an area of skin necrosis that is approximately 3 x 3 cm.  There is no surrounding erythema or cellulitic changes noted.  There is no foul odors.  It is tender to palpation directly over the wound but distally and proximally there is no tenderness to  palpation.  He has normal range of motion of his left wrist.  He has palpable radial pulse.  He has good range of motion of his 5 digits.  Sensation intact along digits 3 through 5.  Some decrease sensation distally on digit 1 through 2.  Good cap refill noted.  Distal extremities warm to touch.   Left lower extremity:    Left forearm:    Lab Results:  CBC Latest Ref Rng & Units 06/13/2021 06/07/2021 06/06/2021  WBC 4.0 - 10.5 K/uL 11.6(H) 10.1 10.3  Hemoglobin 13.0 - 17.0 g/dL 10.5(L) 11.9(L) 11.7(L)  Hematocrit 39.0 - 52.0 % 31.7(L) 35.9(L) 34.6(L)  Platelets 150 - 400 K/uL 432(H) 203 160    BMET No results for input(s): NA, K, CL, CO2, GLUCOSE, BUN, CREATININE, CALCIUM in the last 72 hours. PT/INR No results for input(s): LABPROT, INR in the last 72 hours. ABG No results for input(s): PHART, HCO3 in the last 72 hours.  Invalid input(s): PCO2, PO2  Studies/Results: No results found.  Anti-infectives: Anti-infectives (From admission, onward)    None       Assessment/Plan:  65 year old male with left upper extremity and left lower extremity wound.  I discussed with the patient today that he would benefit from debridement in the operating room given the appearance of his wounds. Patient was unsure if surgery was something he would be interested in.  I encouraged him to think this over.  I discussed with him that given his current health state he is of course at risks and he  is understanding of this as well.  We would need clearance from his primary team and cardiology.  Agree with optimizing nutritional status with vitamins, protein shakes.  May also benefit from zinc if cleared with primary team.  Tentatively plan for debridement of left lower extremity and left upper extremity wound with Dr. Marla Roe on 06/25/2021.  Encourage patient to think this over over the next few days and we would reevaluate and discuss next week.  All of his questions were answered to his content.   Patient is aware of the risks associated with the procedure.  Patient is encouraged to discuss this with family.  Recommend calling with questions or concerns  Pictures were taken and placed in the patient's chart with patient's permission.   LOS: 7 days    Charlies Constable, PA-C 06/19/2021

## 2021-06-19 NOTE — Plan of Care (Signed)
  Problem: RH SKIN INTEGRITY Goal: RH STG SKIN FREE OF INFECTION/BREAKDOWN Description: Skin to remain free from additional breakdown while on CIR with supervision assistance. Outcome: Progressing   Problem: RH SAFETY Goal: RH STG ADHERE TO SAFETY PRECAUTIONS W/ASSISTANCE/DEVICE Description: STG Adhere to Safety Precautions With  Supervision Assistance/Device. Outcome: Progressing   Problem: RH PAIN MANAGEMENT Goal: RH STG PAIN MANAGED AT OR BELOW PT'S PAIN GOAL Description: < 3 on a 0-10 pain scale. Outcome: Progressing

## 2021-06-19 NOTE — Progress Notes (Signed)
Occupational Therapy Weekly Progress Note  Patient Details  Name: Ross [name] MRN: [id number] Date of Birth: May 14, 1956  Beginning of progress report period: June 13, 2021 End of progress report period: June 19, 2021  Today's Date: 06/19/2021 OT Individual Time: 4193-7902 OT Individual Time Calculation (min): 74 min    Patient has met 3 of 3 short term goals.  Pt is consistently transferring and ambulating short distances for functional tasks with CGA using RW. Pt is CGA/Min A for LB ADLs at times d/t L knee bandages and discomfort. Pt does have flat affect, poor safety awareness at times, decreased memory, and delayed processing at times. Continue POC with Supervision goals   Patient continues to demonstrate the following deficits: muscle weakness, decreased cardiorespiratoy endurance, decreased coordination and decreased motor planning, decreased motor planning, decreased attention, decreased awareness, decreased problem solving, decreased safety awareness, decreased memory, and delayed processing, and decreased standing balance, decreased postural control, and decreased balance strategies and therefore will continue to benefit from skilled OT intervention to enhance overall performance with BADL, iADL, and Reduce care partner burden.  Patient progressing toward long term goals..  Continue plan of care.  OT Short Term Goals Week 1:  OT Short Term Goal 1 (Week 1): Pt will complete an ambulatory toilet transfer using LRAD and no more than Min A OT Short Term Goal 1 - Progress (Week 1): Met OT Short Term Goal 2 (Week 1): Pt will complete LB dressing with Min A using adaptive strategies as needed OT Short Term Goal 2 - Progress (Week 1): Met OT Short Term Goal 3 (Week 1): Pt will complete 1 grooming task while standing at the sink for more than 10 sec to increase functional standing endurance OT Short Term Goal 3 - Progress (Week 1): Met Week 2:  OT Short Term Goal 1 (Week 2): STGs = LTGs  at Supervision  Skilled Therapeutic Interventions/Progress Updates:    Pt greeted at time of session sitting up in wheelchair agreeable to OT session, chest pain reported later in session with touching sternum, NP with cardiology present. Pt did have pain in L hand when locking brakes and adaptations made. Extensive time for all tasks as pt requires many cues and rapport building prior to activity. Ambulated chair > bathroom w/ RW CGA and standing same manner to urinate. W/c transport > 4th floor gym and provided built up handle add on to L brake for comfort as pt stated it was hurting his hand. Stand pivot wheelchair <> Nustep CGA and pt performed 8 minutes total on level 2 with frequent O2 sat checks, no chest pain or discomfort reported. NP entered at this time having conversation with pt about cardio status, musculoskeletal pain, cardiac history, etc. Remaining session focused on BUE stretches with some discomfort with shoulder circles, switched to [facility] for shoulder flex/ext, abd/add, circumduction, and ER to tolerance and minimal to no discomfort. Transported back to room, no alarm in room and NT notified going to provide.     Therapy Documentation Precautions:  Precautions Precautions: Fall Precaution Comments: Lifevest Restrictions Weight Bearing Restrictions: No   Therapy/Group: Individual Therapy  Viona Gilmore 06/19/2021, 7:21 AM

## 2021-06-19 NOTE — Progress Notes (Signed)
Physical Therapy Session Note  Patient Details  Name: Ross [name] MRN: [id number] Date of Birth: [birth year]  Today's Date: 06/19/2021 PT Individual Time: 1300-1305 PT Individual Time Calculation (min): 5 min   Short Term Goals: Week 1:  PT Short Term Goal 1 (Week 1): Pt will perform bed to/from wc transfers w/LRAD and cga PT Short Term Goal 2 (Week 1): Pt will perform supine to/from sit w/min to cga PT Short Term Goal 3 (Week 1): Pt will ambulate 162ft w/LRAD and cga PT Short Term Goal 4 (Week 1): Pt will ascend/descend 8 stairs w/one rail w/min assist  Skilled Therapeutic Interventions/Progress Updates:     Pt greeted sitting in w/c to start session. He's agreeable to PT tx but questions his therapy schedule for the day. He requests to get back to bed and needs convincing to work on functional mobility. At this point, plastic surgery P.A. arrives during session for skin/wound care for his L forearm. Pt missed 25 minutes of skilled therapy [name] to medical needs.   Therapy Documentation Precautions:  Precautions Precautions: Fall Precaution Comments: Lifevest Restrictions Weight Bearing Restrictions: No General:    Therapy/Group: Individual Therapy  Ross [name] 06/19/2021, 7:35 AM

## 2021-06-20 LAB — GLUCOSE, CAPILLARY: Glucose-Capillary: 98 mg/dL (ref 70–99)

## 2021-06-20 NOTE — Progress Notes (Signed)
Occupational Therapy Session Note  Patient Details  Name: Ross [name] MRN: [id number] Date of Birth: July 26, 1956  Today's Date: 06/20/2021 OT Individual Time: 1005-1100 OT Individual Time Calculation (min): 55 min   Short Term Goals: Week 2:  OT Short Term Goal 1 (Week 2): STGs = LTGs at Supervision  Skilled Therapeutic Interventions/Progress Updates:    Pt greeted in bed, asleep, woken with increased effort. Upon waking, pt reported needing to "poop." Supervision for bed mobility including remembering to bring LifeVest harness with him. Close supervision for ambulatory transfer to the toilet using RW. Pt able to have bladder void. Agreeable to complete thorough perihygiene to ensure cleanliness. CGA for standing balance due to small LOBs without unilateral UE support on device at times. Min A for hygiene thoroughness in the back due to residue. CGA and vcs for sit<stand. He then ambulated with close supervision using device to the w/c parked near sink. While he was seated, OT washed pts hair using the hair washing tray per request. Note that he continues to require vcs regarding how to assist therapist with hair washing tray. Worked on UB strengthening/endurance afterwards while pt blow-dried and combed hair. Suggested standing at this time to work on his standing endurance + balance with pt adamantly declining. Supervision/cues for managing his LifeVest harness when changing shirts. Close supervision for standing balance when doffing/donning pants using RW for standing support. He remained in the w/c at close of session, all needs within reach. Notified RN of his request for pain medicine. Pt still reports having Lt sided pain.   Therapy Documentation Precautions:  Precautions Precautions: Fall Precaution Comments: Lifevest Restrictions Weight Bearing Restrictions: No  Vital Signs: Therapy Vitals Temp: 97.9 F (36.6 C) Temp Source: Oral Pulse Rate: 66 Resp: 17 BP: (!) 111/55 Patient  Position (if appropriate): Sitting Oxygen Therapy SpO2: 93 % O2 Device: Room Air Pain Assessment Pain Scale: 0-10 Pain Score: 3  Faces Pain Scale: No hurt Pain Type: Acute pain Pain Location: Arm Pain Orientation: Left ADL: ADL Eating: Not assessed Grooming: Setup Where Assessed-Grooming: Edge of bed Upper Body Bathing: Supervision/safety Where Assessed-Upper Body Bathing: Edge of bed Lower Body Bathing: Moderate assistance Where Assessed-Lower Body Bathing: Edge of bed (perihygiene completed partially in seated using lateral leans due to pts increased feelings of anxiety with standing >10 seconds) Upper Body Dressing: Moderate assistance (shirt + LifeVest) Where Assessed-Upper Body Dressing: Edge of bed Lower Body Dressing: Moderate assistance Where Assessed-Lower Body Dressing: Edge of bed Toileting: Minimal assistance Where Assessed-Toileting: Bedside Commode Toilet Transfer: Minimal assistance Toilet Transfer Method: Stand pivot (without AD) Toilet Transfer Equipment: Bedside commode Tub/Shower Transfer: Not assessed     Therapy/Group: Individual Therapy  Tre Sanker A Cadynce Garrette 06/20/2021, 4:06 PM

## 2021-06-20 NOTE — Progress Notes (Signed)
Reports chest pain when shifting weight to left side is improved today.  Suspect musculoskeletal pain, likely related to chest compressions he received.  Would not recommend further ischemic evaluation at this time.  [facility] will sign off.   Medication Recommendations:  No changes Other recommendations (labs, testing, etc):  None Follow up as an outpatient:  As scheduled

## 2021-06-20 NOTE — Progress Notes (Signed)
PROGRESS NOTE   Subjective/Complaints:  Had a BM- a "normal sized one:Marland Kitchen  Pain is "OK".  Asking if can do therapeutic pass with family to go outside- will allow- wrote order.    ROS:  Pt denies SOB, abd pain, CP, N/V/C/D, and vision changes   Objective:   No results found. No results for input(s): WBC, HGB, HCT, PLT in the last 72 hours.  No results for input(s): NA, K, CL, CO2, GLUCOSE, BUN, CREATININE, CALCIUM in the last 72 hours.  No intake or output data in the 24 hours ending 06/20/21 0846    Pressure Injury 06/12/21 Back Mid;Left Stage 2 -  Partial thickness loss of dermis presenting as a shallow open injury with a red, pink wound bed without slough. Left side mid back, stage 2, layer of skin missing, covered with foam dressing, area pink. (Active)  06/12/21 1600  Location: Back  Location Orientation: Mid;Left  Staging: Stage 2 -  Partial thickness loss of dermis presenting as a shallow open injury with a red, pink wound bed without slough.  Wound Description (Comments): Left side mid back, stage 2, layer of skin missing, covered with foam dressing, area pink.  Present on Admission: Yes    Physical Exam: Vital Signs Blood pressure (!) 145/70, pulse 67, temperature 98.6 F (37 C), temperature source Oral, resp. rate 16, height 6\' 4"  (1.93 m), weight 115.2 kg, SpO2 96 %.        General: awake, alert, appropriate, sitting up in bed; NAD HENT: conjugate gaze; oropharynx moist CV: regular rate; no JVD Pulmonary: CTA B/L; no W/R/R- good air movement GI: soft, NT, ND, (+)BS Psychiatric: appropriate but very flat; monotone Neurological: STM issues; alert; delayed responses Musculoskeletal:        General: trace LE present.    Cervical back: Normal range of motion and neck supple.    Comments: RUE- 5/5- in all muscles checked from proximal to distal LUE_ at least 3+/5- cannot tolerate any pressure on LUE to  test strength further RLE- 5/5- in all muscle tested LLE- at least 3/5 in HF, and KF/KE- DF and PF at least 4/5- appears slightly less than RLE Decreased ROM of L knee- hard to tell if muscular/skin related (due to skin necrosis/pain) or if actually has lost ROM - cannot flex L knee to 90 degrees.    Skin:    Comments: Trace LLE edema and L hand edema- not pitting- no change Dressing on LUE/LLE C/D/I Very deep purple where blisters have denuded- with some associated necrosis. On both limbs- has kerlex dressings in place C/D/I L leg and forearm R forearm - looks OK Skin tear L thoracic area- no skin breakdown seen on backside Missing 2 front teeth.  Has partial plate Neurological:.    Comments: Slow and measured. Able to follow simple commands.   Delayed to respond to questions and to ask questions- significant delay noted Tremor in LUE with movement/reaching- intention tremor          Assessment/Plan: 1. Functional deficits which require 3+ hours per day of interdisciplinary therapy in a comprehensive inpatient rehab setting. Physiatrist is providing close team supervision and 24 hour management of  active medical problems listed below. Physiatrist and rehab team continue to assess barriers to discharge/monitor patient progress toward functional and medical goals  Care Tool:  Bathing    Body parts bathed by patient: Right arm, Left arm, Chest, Abdomen, Front perineal area, Right upper leg, Left upper leg, Buttocks, Right lower leg, Face   Body parts bathed by helper: Left lower leg     Bathing assist Assist Level: Independent (offer assistance, will not let u help him)     Upper Body Dressing/Undressing Upper body dressing   What is the patient wearing?: Pull over shirt    Upper body assist Assist Level: Supervision/Verbal cueing    Lower Body Dressing/Undressing Lower body dressing      What is the patient wearing?: Pants     Lower body assist Assist for lower  body dressing: Contact Guard/Touching assist     Toileting Toileting    Toileting assist Assist for toileting: Contact Guard/Touching assist (standing at standard toilet with use of grab bars) Assistive Device Comment: urinal   Transfers Chair/bed transfer  Transfers assist     Chair/bed transfer assist level: Contact Guard/Touching assist     Locomotion Ambulation   Ambulation assist      Assist level: Contact Guard/Touching assist Assistive device: Walker-rolling Max distance: 360   Walk 10 feet activity   Assist     Assist level: Contact Guard/Touching assist Assistive device: Walker-rolling   Walk 50 feet activity   Assist    Assist level: Contact Guard/Touching assist Assistive device: Walker-rolling    Walk 150 feet activity   Assist Walk 150 feet activity did not occur: Safety/medical concerns  Assist level: Contact Guard/Touching assist Assistive device: Walker-rolling    Walk 10 feet on uneven surface  activity   Assist Walk 10 feet on uneven surfaces activity did not occur: Safety/medical concerns (could not tolerate assessment due to fatigue)         Wheelchair     Assist Will patient use wheelchair at discharge?: No             Wheelchair 50 feet with 2 turns activity    Assist            Wheelchair 150 feet activity     Assist          Blood pressure (!) 145/70, pulse 67, temperature 98.6 F (37 C), temperature source Oral, resp. rate 16, height 6\' 4"  (1.93 m), weight 115.2 kg, SpO2 96 %.  Medical Problem List and Plan: 1.  Anoxic brain injury secondary to cardiac arrest with CPR >90 minutes! S/P pressors with LUE/LLE wounds             -patient may not shower due to life vest-              -ELOS/Goals: 2-2.15 weeks- supervision to mod I- needs PT, OT and SLP- pt doesn't have insight into memory/cognitive issues.  Continue CIR- PT, OT and SLP Will allow pt to go on outdoors- wrote for therapeutic  pass  2.  Antithrombotics: -DVT/anticoagulation:  Pharmaceutical: Other (comment) --Eliquis.             -antiplatelet therapy: ASA 3. Chest wall pain: Continue Oxycodone prn for chest pain- hx of taking oxy at home chronically, but stopped prior to admission due to constipation.   6/28- loves Gabapentin 300 mg QHS for nerve pain- con't regimen  6/30- having L sided chest pain when turns to left side- have called Cards, just  in case, with extensive recent cardiac hx.   7/1- Chest pain is NON CARDIAC_ reproduces with palpation- con't pain regimen 4. Mood: LCSW to follow for evaluation and support.              -antipsychotic agents: N/A 5. Neuropsych: This patient is? capable of making decisions on his own behalf. 6. Skin/Wound Care: local care to denuded blisters.             --continue Vitamin C. Will add protein supplements to promote wound healing.   6/28- will reassess today after rounds- will go see- nurse to [facility] me.  7. Fluids/Electrolytes/Nutrition: Monitor I/O. Check lytes in am. 8. CAD/ICM: Treated medically --avoid nephrotoxic meds             --On BIDIL, coreg, ASA, Lipitor.             --monitor for symptoms with increase in activity.  9. Left forearm/left shin ulcers due to blistering/pressors/necrosis: Continue xeroform with dry dressing changes daily.  6/24- assessed this AM- look better than expected- con't wound care- will con't to monitor  7/1- Got bedside debridement by Plastics- will likely go to OR 7/6 for additional debridement- pt deciding.  10. Constipation:On Senna and miralax-->will increase miralax to bid. Senna increased to BID  6/29- added Linzess prn- and con't regimen  6/20- will give Sorbitol 60 cc after therapy today  7/1-has good BM- yesterday- con't regimen 11. Neuropathy: Will start low dose gabapentin at nights and titrate upwards as tolerated.             --had psychosis with Lyrica.  6/28- will con't gabapentin 300 mg qhs- [employer] 12.  Hyperglycemia:  Hgb A1C- 5.6: decrease CBGs to daily.  13. PAF: Monitor HR tid--continue amiodarone, Coreg and Eliquis 14. VT/VF: LifeVest in place--will need continued education on management.             --Amiodarone 400 mg bid-->100 mg on 06/29  6/24- advised pt not to remove Life Vest- wrote order to do same  6/27- can change out with other life vest- let nursing know to not take off, but exchange actual vest.   6/28- Life vest people are coming today to see if can change out actual vest so can clean the other life vest, if possible.   6/30- having chest pain when turns to left- no SOB/palpitations- will call Cards to make sure not cardiac.  15. AKI: Resolving. Continue to avoid nephrotoxic drugs and monitor for recovery.   6/27- Cr down to 2.29- is continuing  to improve- will monitor  6/30- will recheck Friday.   7/1-labs q Monday - will see how progressing-  16. COPD: Stable without MDI at this time.             --monitor for symptoms with increase in activity. 17. sCHF- with EF of 25%- will monitor daily weights and monitor  6/28- weights have gone down from max of 113 kg- down to 109.6 kg- con't to monitor  6/30- weight up significantly to 115 kg, but changed beds- could be cause, vs change in weight 6 kg!  7/1- will follow since changed beds yesterday.  18. Left thumb pain/weakness: discussed prognosis, ordered ice to forearm, lidocaine jelly to thumb. 19. Constipation 6/28- hx of using Linzess prn- LBM 2 days ago- will restart Linzess PRN- so can take if no BM for 2-3 days/feels constipated- take before breakfast.  6/29- if no BM by tomorrow, will give Sorbitol. 6/30- ordered  Sorbitol after therapy.  7/1- had good sized BM- con't regimen     LOS: 8 days A FACE TO FACE EVALUATION WAS PERFORMED  Osiel Stick 06/20/2021, 8:46 AM

## 2021-06-20 NOTE — Progress Notes (Signed)
Physical Therapy Session Note  Patient Details  Name: Ross [name] MRN: [id number] Date of Birth: [birth year]  Today's Date: 06/20/2021 PT Individual Time: 1106-1210 PT Individual Time Calculation (min): 64 min   Short Term Goals: Week 1:  PT Short Term Goal 1 (Week 1): Pt will perform bed to/from wc transfers w/LRAD and cga PT Short Term Goal 2 (Week 1): Pt will perform supine to/from sit w/min to cga PT Short Term Goal 3 (Week 1): Pt will ambulate 122ft w/LRAD and cga PT Short Term Goal 4 (Week 1): Pt will ascend/descend 8 stairs w/one rail w/min assist   Skilled Therapeutic Interventions/Progress Updates:  Patient seated upright in w/c on entrance to room. Patient alert but grumpy at start of session. Relates that he was in a very deep sleep when he was abruptly woken which startled him and put him in a foul mood. Also relates that he is distrusting of people in general. Therapeutic alliance built for improved participation. Empathetic listening for pt to relate all current feelings and past medical experiences with decreased trust of people in general. Patient denied pain during session.  Therapeutic Activity: Transfers: Patient performed STS without use of RW with supervision/ CGA and SPVT transfers with CGA using RW. Provided min vc for technique.  Gait Training:  Patient performed stair training and is able to ascend/ descend four 6-in steps x3 for 2 separate bouts. Completes with CGA throughout. Is able to initiate ascent with R HR only and then requires L HR to complete d/t nearing top step with decreased support available. Descends with BUE on L HR.   Neuromuscular Re-ed: NMR facilitated during session with focus on standing balance and self efficacy. Pt guided in static and dynamic stepping to reach called numbers placed at chest and shoulder height on wall in front of pt. Guided in use of R/LUE. Pt initially fearful of stepping fwd or bkwd when focused on stepping task only, but  when included in context of completion of reaching task, pt is able to perform with no hesitation and no UE support. NMR performed for improvements in motor control and coordination, balance, sequencing, judgement, and self confidence/ efficacy in performing all aspects of mobility at highest level of independence.   Patient seated  in recliner at end of session with brakes locked, seat alarm set, and all needs within reach.    Therapy Documentation Precautions:  Precautions Precautions: Fall Precaution Comments: Lifevest Restrictions Weight Bearing Restrictions: No  Therapy/Group: Individual Therapy  Alger Simons PT, DPT 06/20/2021, 6:06 PM

## 2021-06-20 NOTE — Progress Notes (Signed)
Occupational Therapy Session Note  Patient Details  Name: Ross [name] MRN: [id number] Date of Birth: [birth year]  Today's Date: 06/20/2021 OT Individual Time: 1335-1401 OT Individual Time Calculation (min): 26 min    Short Term Goals: Week 2:  OT Short Term Goal 1 (Week 2): STGs = LTGs at Supervision  Skilled Therapeutic Interventions/Progress Updates:    Pt received seated in recliner, no c/o painse, agreeable to therapy. Session focus olf-care retrianing + functional mobility without AD + therapeutic exercise in prep for improved ADL/IADL performance. STS close S and amb to sink with CGA, eager to not use RW. Completed oral care + facial hair grooming in standing with close S. Amb to bathroom CGA to min A for balance with no AD and stood to urinate, CGA for LB clothing management. Noted to rely on furniture walking. Completed 1x10 chair push-ups and STS from chair with overall CGA. HR read at 89, satO2 at 93% on RA post activity. Educated on self-monitoring fatigue level and initiating seated rest breaks with use of modified RPE, reports 3/10 pre activity then 6/10 post. Amb back to recliner min A for balance.   Pt left in recliner with RN present with chair alarm engaged, call bell in reach, and all immediate needs met.    Therapy Documentation Precautions:  Precautions Precautions: Fall Precaution Comments: Lifevest Restrictions Weight Bearing Restrictions: No  Pain:  No c/o ADL: See Care Tool for more details.   Therapy/Group: Individual Therapy  Volanda Napoleon MS, OTR/L  06/20/2021, 6:59 AM

## 2021-06-20 NOTE — Progress Notes (Signed)
Speech Language Pathology Weekly Progress and Session Note  Patient Details  Name: Ross [name] MRN: [id number] Date of Birth: Jul 01, 1956  Beginning of progress report period: 06/13/2021  End of progress report period:  06/20/2021  Today's Date: 06/20/2021 SLP Individual Time: 0805-0850 SLP Individual Time Calculation (min): 45 min  Short Term Goals: Week 1: SLP Short Term Goal 1 (Week 1): Pt will tolerate Dys3/thin diet with minimal s/s aspiration and dysphagia with Supervision level cues for swallow strategies SLP Short Term Goal 1 - Progress (Week 1): Met SLP Short Term Goal 2 (Week 1): Pt will participate in MBSS to assess anatomy and physiology of swallow SLP Short Term Goal 2 - Progress (Week 1): Met SLP Short Term Goal 3 (Week 1): Pt will complete med/money management tasks with min A SLP Short Term Goal 3 - Progress (Week 1): Met SLP Short Term Goal 4 (Week 1): Pt will complete moderately complex problem solving tasks with min A SLP Short Term Goal 4 - Progress (Week 1): Met SLP Short Term Goal 5 (Week 1): Pt will recall novel information with min A SLP Short Term Goal 5 - Progress (Week 1): Met    New Short Term Goals: Week 2: SLP Short Term Goal 1 (Week 2): STG=LTG's (planned discharge from CIR on 7/8)  Weekly Progress Updates:  Patient made good progress, meeting all 4 STG's and currently at supervision to minA level for swallow and cognitive function. He is tolerating Dys 3 solids, thin liquids with swallow precautions of alternating solids and liquids and performing dry swallows intermittently. Patient continues with difficulty consistently demonstrating recall of recent events, strategies, exercises, etc.    Intensity: Minumum of 1-2 x/day, 30 to 90 minutes Frequency: 3 to 5 out of 7 days Duration/Length of Stay: 7/8 Treatment/Interventions: Cognitive remediation/compensation;Dysphagia/aspiration precaution training;Cueing hierarchy;Internal/external aids;Medication  managment;Therapeutic Activities;Therapeutic Exercise;Patient/family education;Functional tasks   Daily Session  Skilled Therapeutic Interventions: Patient seen for skilled ST session focusing on dysphagia and cognitive goals. Patient observed by SLP during PO intake of breakfast meal. He did not independently recall swallow precaution of alternating bites and sips, but did acknowledge hearing this when SLP described. No overt s/s aspiration or penetration with solids or liquids noted, though per MBS, patient had silent penetration with thin liquids. SLP reviewed swallow precautions and exercises, giving patient handout with explanations. Patient reported that he will frequently get stuck "spelling words in my head" repeatedly and this has occurred in the past when has been sick/ill enough that he is more "bedbound". SLP described technique of controlled breathing but patient reported that makes him anxious. Patient also reported ongoing difficulty with memory and SLP provided memory notebook and encouraged patient to keep daily log/record to help with his memory but also to help with tracking progress.     General    Pain Pain Assessment Pain Scale: 0-10 Pain Score: 4  Faces Pain Scale: No hurt Pain Type: Acute pain Pain Location: Arm Pain Orientation: Left  Therapy/Group: Individual Therapy  Sonia Baller, MA, CCC-SLP Speech Therapy

## 2021-06-20 NOTE — Plan of Care (Signed)
  Problem: Consults Goal: RH GENERAL PATIENT EDUCATION Description: See Patient Education module for education specifics. Outcome: Progressing Goal: Skin Care Protocol Initiated - if Braden Score 18 or less Description: If consults are not indicated, leave blank or document N/A Outcome: Progressing Goal: Diabetes Guidelines if Diabetic/Glucose > 140 Description: If diabetic or lab glucose is > 140 mg/dl - Initiate Diabetes/Hyperglycemia Guidelines & Document Interventions  Outcome: Progressing   Problem: RH SKIN INTEGRITY Goal: RH STG SKIN FREE OF INFECTION/BREAKDOWN Description: Skin to remain free from additional breakdown while on CIR with supervision assistance. Outcome: Progressing Goal: RH STG ABLE TO PERFORM INCISION/WOUND CARE W/ASSISTANCE Description: STG Able To Perform Incision/Wound Care With  Supervision Assistance. Outcome: Progressing   Problem: RH SAFETY Goal: RH STG ADHERE TO SAFETY PRECAUTIONS W/ASSISTANCE/DEVICE Description: STG Adhere to Safety Precautions With  Supervision Assistance/Device. Outcome: Progressing Goal: RH STG DECREASED RISK OF FALL WITH ASSISTANCE Description: STG Decreased Risk of Fall With cues and reminders. Outcome: Progressing   Problem: RH PAIN MANAGEMENT Goal: RH STG PAIN MANAGED AT OR BELOW PT'S PAIN GOAL Description: < 3 on a 0-10 pain scale. Outcome: Progressing   Problem: RH KNOWLEDGE DEFICIT GENERAL Goal: RH STG INCREASE KNOWLEDGE OF SELF CARE AFTER HOSPITALIZATION Description: Patient will demonstrate knowledge of medication management, dietary management, skin/wound care with educational materials and handouts provided by staff independently at discharge. Outcome: Progressing

## 2021-06-21 LAB — GLUCOSE, CAPILLARY: Glucose-Capillary: 93 mg/dL (ref 70–99)

## 2021-06-21 NOTE — Plan of Care (Signed)
  Problem: Consults Goal: RH GENERAL PATIENT EDUCATION Description: See Patient Education module for education specifics. Outcome: Progressing Goal: Skin Care Protocol Initiated - if Braden Score 18 or less Description: If consults are not indicated, leave blank or document N/A Outcome: Progressing Goal: Diabetes Guidelines if Diabetic/Glucose > 140 Description: If diabetic or lab glucose is > 140 mg/dl - Initiate Diabetes/Hyperglycemia Guidelines & Document Interventions  Outcome: Progressing   Problem: RH SKIN INTEGRITY Goal: RH STG SKIN FREE OF INFECTION/BREAKDOWN Description: Skin to remain free from additional breakdown while on CIR with supervision assistance. Outcome: Progressing Goal: RH STG ABLE TO PERFORM INCISION/WOUND CARE W/ASSISTANCE Description: STG Able To Perform Incision/Wound Care With  Supervision Assistance. Outcome: Progressing   Problem: RH SAFETY Goal: RH STG ADHERE TO SAFETY PRECAUTIONS W/ASSISTANCE/DEVICE Description: STG Adhere to Safety Precautions With  Supervision Assistance/Device. Outcome: Progressing Goal: RH STG DECREASED RISK OF FALL WITH ASSISTANCE Description: STG Decreased Risk of Fall With cues and reminders. Outcome: Progressing   Problem: RH PAIN MANAGEMENT Goal: RH STG PAIN MANAGED AT OR BELOW PT'S PAIN GOAL Description: < 3 on a 0-10 pain scale. Outcome: Progressing   Problem: RH KNOWLEDGE DEFICIT GENERAL Goal: RH STG INCREASE KNOWLEDGE OF SELF CARE AFTER HOSPITALIZATION Description: Patient will demonstrate knowledge of medication management, dietary management, skin/wound care with educational materials and handouts provided by staff independently at discharge. Outcome: Progressing

## 2021-06-21 NOTE — Progress Notes (Signed)
Physical Therapy Weekly Progress Note  Patient Details  Name: Ross [name] MRN: [id number] Date of Birth: Dec 14, 1956  Beginning of progress report period: June 13, 2021 End of progress report period: June 21, 2021  Today's Date: 06/21/2021 PT Individual Time: 0900-1000 PT Individual Time Calculation (min): 60 min   Patient has met 4 of 4 short term goals.  Pt is able to transfer with CGA and no AD, gait up to 100 ft with close supervision and without AD. Pt continues to have flat affect, delayed processing, and poor safety awareness at times. Pt progressing toward supervision goals  Patient continues to demonstrate the following deficits muscle weakness, decreased cardiorespiratoy endurance, possible peripheral vertigo (requires further testing, and decreased standing balance and difficulty maintaining precautions and therefore will continue to benefit from skilled PT intervention to increase functional independence with mobility.  Patient progressing toward long term goals..  Continue plan of care.  PT Short Term Goals Week 1:  PT Short Term Goal 1 (Week 1): Pt will perform bed to/from wc transfers w/LRAD and cga PT Short Term Goal 1 - Progress (Week 1): Met PT Short Term Goal 2 (Week 1): Pt will perform supine to/from sit w/min to cga PT Short Term Goal 2 - Progress (Week 1): Met PT Short Term Goal 3 (Week 1): Pt will ambulate 179ft w/LRAD and cga PT Short Term Goal 3 - Progress (Week 1): Met PT Short Term Goal 4 (Week 1): Pt will ascend/descend 8 stairs w/one rail w/min assist Week 2:  PT Short Term Goal 1 (Week 2): Pt will ascend/ descend 8 stairs with 1 handrail and supervision PT Short Term Goal 2 (Week 2): Pt will walk x300 ft with no AD and supervision PT Short Term Goal 3 (Week 2): Pt will navigate community environment with LRAD and supervision.  Skilled Therapeutic Interventions/Progress Updates:  Session 1: pt received in bed and agreeable to therapy. No c/o of pain. Bed  mobility with supervision. STS with supervision, pt walks to sink, stands at sink to brush sink, and stands to void at toilet with CGA and no AD. Pt walked with no AD and close supervision x 150 ft. Pt then transported to therapy gym. Therapist performed side lying maneuver to screen for BPPV, both sides negative. Pt then performed 3x4 6 in steps with BUE support and CGA. Pt's HR=87 after performing stairs. Gait x 150 ft back to room with close supervision, pt returned to bed and was left with all needs in reach and bed alarm active.  Therapy Documentation Precautions:  Precautions Precautions: Fall Precaution Comments: Lifevest Restrictions Weight Bearing Restrictions: No   Therapy/Group: Individual Therapy  Mickel Fuchs 06/21/2021, 10:17 AM

## 2021-06-21 NOTE — Progress Notes (Signed)
Physical Therapy Session Note  Patient Details  Name: Ross [name] MRN: [id number] Date of Birth: May 01, 1956  Today's Date: 06/21/2021 PT Individual Time: 1400-1430 PT Individual Time Calculation (min): 30 min   Short Term Goals: Week 1:  PT Short Term Goal 1 (Week 1): Pt will perform bed to/from wc transfers w/LRAD and cga PT Short Term Goal 1 - Progress (Week 1): Met PT Short Term Goal 2 (Week 1): Pt will perform supine to/from sit w/min to cga PT Short Term Goal 2 - Progress (Week 1): Met PT Short Term Goal 3 (Week 1): Pt will ambulate 123f w/LRAD and cga PT Short Term Goal 3 - Progress (Week 1): Met PT Short Term Goal 4 (Week 1): Pt will ascend/descend 8 stairs w/one rail w/min assist  Skilled Therapeutic Interventions/Progress Updates:    Pt seated in recliner on arrival with fiance [name]. Today's session focused on community reintegration and exercise in new environments. Pt ambulated while holding [name]'s hand x 80 ft before becoming fatigued. Pt was then transported outside to join family for visit. Pt completed 2 walks with family x 200  ft outside, HR=96 and 90. Pt reported increased shortness of breath with activity due to the heat, but was able to complete with no LOB. Pt was left outdoors with family, to return after visiting. Family members educated on staying seated in the w/c for safety when no staff members were present.   Therapy Documentation Precautions:  Precautions Precautions: Fall Precaution Comments: Lifevest Restrictions Weight Bearing Restrictions: No     Therapy/Group: Individual Therapy  OMickel Fuchs7/01/2021, 3:03 PM

## 2021-06-21 NOTE — Progress Notes (Signed)
PROGRESS NOTE   Subjective/Complaints: Pt asked why he needs "more surgery" on his wounds. Denied any new issues this morning  ROS: Patient denies fever, rash, sore throat, blurred vision, nausea, vomiting, diarrhea, cough, shortness of breath or chest pain, joint or back pain, headache, or mood change.    Objective:   No results found. No results for input(s): WBC, HGB, HCT, PLT in the last 72 hours.  No results for input(s): NA, K, CL, CO2, GLUCOSE, BUN, CREATININE, CALCIUM in the last 72 hours.   Intake/Output Summary (Last 24 hours) at 06/21/2021 1609 Last data filed at 06/21/2021 0700 Gross per 24 hour  Intake 691 ml  Output --  Net 691 ml      Pressure Injury 06/12/21 Back Mid;Left Stage 2 -  Partial thickness loss of dermis presenting as a shallow open injury with a red, pink wound bed without slough. Left side mid back, stage 2, layer of skin missing, covered with foam dressing, area pink. (Active)  06/12/21 1600  Location: Back  Location Orientation: Mid;Left  Staging: Stage 2 -  Partial thickness loss of dermis presenting as a shallow open injury with a red, pink wound bed without slough.  Wound Description (Comments): Left side mid back, stage 2, layer of skin missing, covered with foam dressing, area pink.  Present on Admission: Yes    Physical Exam: Vital Signs Blood pressure (!) 110/43, pulse 63, temperature 97.8 F (36.6 C), resp. rate 18, height 6\' 4"  (1.93 m), weight 115.2 kg, SpO2 97 %. Constitutional: No distress . Vital signs reviewed. HEENT: EOMI, oral membranes moist Neck: supple Cardiovascular: RRR without murmur. No JVD    Respiratory/Chest: CTA Bilaterally without wheezes or rales. Normal effort    GI/Abdomen: BS +, non-tender, non-distended Ext: no clubbing, cyanosis, or edema Psych: pleasant and generally cooperative Musculoskeletal:        General: trace LE present.    Cervical back:  Normal range of motion and neck supple.   RUE- 5/5- in all muscles checked from proximal to distal LUE_ at least 3+/5- cannot tolerate any pressure on LUE to test strength further RLE- 5/5- in all muscle tested LLE- at least 3/5 in HF, and KF/KE- DF and PF at least 4/5- appears slightly less than RLE Decreased ROM of L knee- hard to tell if muscular/skin related (due to skin necrosis/pain) or if actually has lost ROM - cannot flex L knee to 90 degrees.    Skin:    Comments: Trace LLE edema and L hand edema- not pitting- no change Dressing on LUE/LLE C/D/I Very deep purple where blisters have denuded- with some associated necrosis at both wrist and leg On both limbs- has kerlex dressings in place C/D/I L leg and forearm Skin tear L thoracic area- no skin breakdown seen on backside Missing 2 front teeth.  Has partial plate Neurological:.    alert and oriented. No focal CN findings. Slow and measured. Able to follow simple commands.   Delayed to respond to questions and to ask questions- significant delay noted Tremor in LUE with movement/reaching- intention tremor          Assessment/Plan: 1. Functional deficits which require 3+  hours per day of interdisciplinary therapy in a comprehensive inpatient rehab setting. Physiatrist is providing close team supervision and 24 hour management of active medical problems listed below. Physiatrist and rehab team continue to assess barriers to discharge/monitor patient progress toward functional and medical goals  Care Tool:  Bathing    Body parts bathed by patient: Right arm, Left arm, Chest, Abdomen, Front perineal area, Right upper leg, Left upper leg, Buttocks, Right lower leg, Face   Body parts bathed by helper: Left lower leg     Bathing assist Assist Level: Independent (offer assistance, will not let u help him)     Upper Body Dressing/Undressing Upper body dressing   What is the patient wearing?: Pull over shirt    Upper body  assist Assist Level: Supervision/Verbal cueing    Lower Body Dressing/Undressing Lower body dressing      What is the patient wearing?: Pants     Lower body assist Assist for lower body dressing: Supervision/Verbal cueing     Toileting Toileting    Toileting assist Assist for toileting: Minimal Assistance - Patient > 75% Assistive Device Comment: urinal   Transfers Chair/bed transfer  Transfers assist     Chair/bed transfer assist level: Contact Guard/Touching assist     Locomotion Ambulation   Ambulation assist      Assist level: Contact Guard/Touching assist Assistive device: Walker-rolling Max distance: 360   Walk 10 feet activity   Assist     Assist level: Supervision/Verbal cueing Assistive device: Walker-rolling   Walk 50 feet activity   Assist    Assist level: Contact Guard/Touching assist Assistive device: Walker-rolling    Walk 150 feet activity   Assist Walk 150 feet activity did not occur: Safety/medical concerns  Assist level: Contact Guard/Touching assist Assistive device: Walker-rolling    Walk 10 feet on uneven surface  activity   Assist Walk 10 feet on uneven surfaces activity did not occur: Safety/medical concerns (could not tolerate assessment due to fatigue)         Wheelchair     Assist Will patient use wheelchair at discharge?: No             Wheelchair 50 feet with 2 turns activity    Assist            Wheelchair 150 feet activity     Assist          Blood pressure (!) 110/43, pulse 63, temperature 97.8 F (36.6 C), resp. rate 18, height 6\' 4"  (1.93 m), weight 115.2 kg, SpO2 97 %.  Medical Problem List and Plan: 1.  Anoxic brain injury secondary to cardiac arrest with CPR >90 minutes! S/P pressors with LUE/LLE wounds             -patient may not shower due to life vest-              -ELOS/Goals: 2-2.15 weeks- supervision to mod I- needs PT, OT and SLP- pt doesn't have insight  into memory/cognitive issues.  Continue CIR- PT, OT and SLP Grounds pass prn  2.  Antithrombotics: -DVT/anticoagulation:  Pharmaceutical: Other (comment) --Eliquis.             -antiplatelet therapy: ASA 3. Chest wall pain: Continue Oxycodone prn for chest pain- hx of taking oxy at home chronically, but stopped prior to admission due to constipation.   6/28- loves Gabapentin 300 mg QHS for nerve pain- con't regimen  7/1- Chest pain is NON CARDIAC_ reproduces with palpation- con't pain  regimen 4. Mood: LCSW to follow for evaluation and support.              -antipsychotic agents: N/A 5. Neuropsych: This patient is? capable of making decisions on his own behalf. 6. Skin/Wound Care: local care to denuded blisters.             --continue Vitamin C. Will add protein supplements to promote wound healing.   7/2 reviewed wounds with pt. Discussed with him that there are areas in both left-sided wounds with necrotic tissue which would benefit from I&D. Plastics is planning on taking him to OR next week.  7. Fluids/Electrolytes/Nutrition: Monitor I/O. Check lytes in am. 8. CAD/ICM: Treated medically --avoid nephrotoxic meds             --On BIDIL, coreg, ASA, Lipitor.             --monitor for symptoms with increase in activity.  9. Left forearm/left shin ulcers due to blistering/pressors/necrosis: Continue xeroform with dry dressing changes daily.  6/24- assessed this AM- look better than expected- con't wound care- will con't to monitor  7/2- Got bedside debridement by Plastics- will likely go to OR 7/6 for additional debridement- pt deciding---reviewed with pt again today  10. Constipation:On Senna and miralax-->will increase miralax to bid. Senna increased to BID  6/29- added Linzess prn- and con't regimen  6/20- will give Sorbitol 60 cc after therapy today  7/1-has good BM- yesterday- con't regimen 11. Neuropathy: Will start low dose gabapentin at nights and titrate upwards as tolerated.              --had psychosis with Lyrica.  6/28- will con't gabapentin 300 mg qhs-   12. Hyperglycemia:  Hgb A1C- 5.6: decrease CBGs to daily.  13. PAF: Monitor HR tid--continue amiodarone, Coreg and Eliquis 14. VT/VF: LifeVest in place--will need continued education on management.             --Amiodarone 400 mg bid-->100 mg on 06/29  6/24- advised pt not to remove Life Vest- wrote order to do same  7/2 has an extra vest to switch out 15. AKI: Resolving. Continue to avoid nephrotoxic drugs and monitor for recovery.   6/27- Cr down to 2.29- is continuing  to improve- will monitor  6/30- will recheck Friday.   7/1-labs q Monday - will see how progressing-  16. COPD: Stable without MDI at this time.             --monitor for symptoms with increase in activity. 17. sCHF- with EF of 25%- will monitor daily weights and monitor  6/28- weights have gone down from max of 113 kg- down to 109.6 kg- con't to monitor  6/30- weight up significantly to 115 kg, but changed beds- could be cause, vs change in weight 6 kg!   Filed Weights   06/17/21 0529 06/18/21 0440 06/19/21 0500  Weight: 109.6 kg 109.5 kg 115.2 kg    7/2 need daily weights  18. Left thumb pain/weakness: discussed prognosis, ordered ice to forearm, lidocaine jelly to thumb. 19. Constipation 6/28- hx of using Linzess prn- LBM 2 days ago- will restart Linzess PRN- so can take if no BM for 2-3 days/feels constipated- take before breakfast.  6/29- if no BM by tomorrow, will give Sorbitol. 6/30- ordered Sorbitol after therapy.  7/1- had good sized BM- con't regimen     LOS: 9 days A FACE TO FACE EVALUATION WAS PERFORMED  Meredith Staggers 06/21/2021, 4:09 PM

## 2021-06-22 LAB — GLUCOSE, CAPILLARY: Glucose-Capillary: 96 mg/dL (ref 70–99)

## 2021-06-22 NOTE — Progress Notes (Signed)
Occupational Therapy Session Note  Patient Details  Name: Ross [name] MRN: [id number] Date of Birth: 11-Apr-1956  Today's Date: 06/22/2021 OT Individual Time: 1594-5859 OT Individual Time Calculation (min): 69 min    Skilled Therapeutic Interventions/Progress Updates:    Pt greeted in the recliner, smiling, both fiance and daughter present. With encouragement from OT and family, pt willing to shower today. Took the opportunity to place focus of session on family education. [place] had hands on practice with wrapping pts Lt UE/LE in prep for shower when given instruction from OT. Discussed having LifeVest on during all transfers and then removing while he sits (the last article of clothing to remove). Pt required assistance for perihygiene, washing feet, and Rt underarm (due to limited Lt UE ROM from waterproof covering). [place] able to provide needed assistance. Needed cues to allow pt to engage in bathing tasks at max level of independence. After he was thoroughly dried, LifeVest donned again. Increased assistance for pants and footwear today due to time. [name] then assisted pt with transferring out of the shower, Min A without AD. He then ambulated to the w/c in front of the sink to shave. Left pt in care of RN to adjust new LifeVest and family to assist with shaving.  Therapy Documentation Precautions:  Precautions Precautions: Fall Precaution Comments: Lifevest Restrictions Weight Bearing Restrictions: No  ADL: ADL Eating: Not assessed Grooming: Setup Where Assessed-Grooming: Edge of bed Upper Body Bathing: Supervision/safety Where Assessed-Upper Body Bathing: Edge of bed Lower Body Bathing: Moderate assistance Where Assessed-Lower Body Bathing: Edge of bed (perihygiene completed partially in seated using lateral leans due to pts increased feelings of anxiety with standing >10 seconds) Upper Body Dressing: Moderate assistance (shirt + LifeVest) Where Assessed-Upper Body  Dressing: Edge of bed Lower Body Dressing: Moderate assistance Where Assessed-Lower Body Dressing: Edge of bed Toileting: Minimal assistance Where Assessed-Toileting: Bedside Commode Toilet Transfer: Minimal assistance Toilet Transfer Method: Stand pivot (without AD) Toilet Transfer Equipment: Bedside commode Tub/Shower Transfer: Not assessed     Therapy/Group: Individual Therapy  Hartleigh Edmonston A Alixandria Friedt 06/22/2021, 3:59 PM

## 2021-06-23 LAB — BASIC METABOLIC PANEL
Anion gap: 4 — ABNORMAL LOW (ref 5–15)
BUN: 42 mg/dL — ABNORMAL HIGH (ref 8–23)
CO2: 24 mmol/L (ref 22–32)
Calcium: 11.9 mg/dL — ABNORMAL HIGH (ref 8.9–10.3)
Chloride: 109 mmol/L (ref 98–111)
Creatinine, Ser: 1.86 mg/dL — ABNORMAL HIGH (ref 0.61–1.24)
GFR, Estimated: 39 mL/min — ABNORMAL LOW (ref >60)
Glucose, Bld: 95 mg/dL (ref 70–99)
Potassium: 5.4 mmol/L — ABNORMAL HIGH (ref 3.5–5.1)
Sodium: 137 mmol/L (ref 135–145)

## 2021-06-23 LAB — CBC WITH DIFFERENTIAL/PLATELET
Abs Immature Granulocytes: 0.04 10*3/uL (ref 0.00–0.07)
Basophils Absolute: 0.1 10*3/uL (ref 0.0–0.1)
Basophils Relative: 1 %
Eosinophils Absolute: 0.4 10*3/uL (ref 0.0–0.5)
Eosinophils Relative: 5 %
HCT: 33.2 % — ABNORMAL LOW (ref 39.0–52.0)
Hemoglobin: 10.5 g/dL — ABNORMAL LOW (ref 13.0–17.0)
Immature Granulocytes: 1 %
Lymphocytes Relative: 28 %
Lymphs Abs: 2.2 10*3/uL (ref 0.7–4.0)
MCH: 31.3 pg (ref 26.0–34.0)
MCHC: 31.6 g/dL (ref 30.0–36.0)
MCV: 99.1 fL (ref 80.0–100.0)
Monocytes Absolute: 0.9 10*3/uL (ref 0.1–1.0)
Monocytes Relative: 11 %
Neutro Abs: 4.4 10*3/uL (ref 1.7–7.7)
Neutrophils Relative %: 54 %
Platelets: 353 10*3/uL (ref 150–400)
RBC: 3.35 MIL/uL — ABNORMAL LOW (ref 4.22–5.81)
RDW: 13.4 % (ref 11.5–15.5)
WBC: 8.1 10*3/uL (ref 4.0–10.5)
nRBC: 0 % (ref 0.0–0.2)

## 2021-06-23 LAB — GLUCOSE, CAPILLARY: Glucose-Capillary: 90 mg/dL (ref 70–99)

## 2021-06-23 MED ORDER — SODIUM POLYSTYRENE SULFONATE 15 GM/60ML PO SUSP
15.0000 g | Freq: Once | ORAL | Status: AC
  Administered 2021-06-23: 15 g via ORAL
  Filled 2021-06-23: qty 60

## 2021-06-23 NOTE — Progress Notes (Signed)
Occupational Therapy Session Note  Patient Details  Name: Ross [name] MRN: [id number] Date of Birth: January 27, 1956  Today's Date: 06/23/2021 OT Individual Time: 1400-1511 OT Individual Time Calculation (min): 71 min    Short Term Goals: Week 1:  OT Short Term Goal 1 (Week 1): Pt will complete an ambulatory toilet transfer using LRAD and no more than Min A OT Short Term Goal 1 - Progress (Week 1): Met OT Short Term Goal 2 (Week 1): Pt will complete LB dressing with Min A using adaptive strategies as needed OT Short Term Goal 2 - Progress (Week 1): Met OT Short Term Goal 3 (Week 1): Pt will complete 1 grooming task while standing at the sink for more than 10 sec to increase functional standing endurance OT Short Term Goal 3 - Progress (Week 1): Met Week 2:  OT Short Term Goal 1 (Week 2): STGs = LTGs at Supervision  Skilled Therapeutic Interventions/Progress Updates:    Pt greeted at time of session reclined in bed resting agreeable to OT session, no pain throughout. SO [name] joined session as well at this time and remained throughout. Pt declined ADL. Focus of session on family education/training and DC planning as pt/significant other had many questions regarding DME and bathroom set up. Note pt to be short and getting frustrated easily with figurative speech so transported downstairs to tub shower room. All transfers to/from bed, recliner, wheelchair, and TTB with close supervision/CGA and no AD as pt declined. Once on TTB in tub shower room, problem solved through grab bar placement and DME needs, see sticky note for details. Written list as well for pt's SO to get: longer shower handle, heavy duty grab bars installed, and other items provided via hospital. Cues for pt throughout for redirecting and staying on task. Up in recliner alarm on call bell in reach.   Therapy Documentation Precautions:  Precautions Precautions: Fall Precaution Comments: Lifevest Restrictions Weight Bearing  Restrictions: No      Therapy/Group: Individual Therapy  Viona Gilmore 06/23/2021, 3:55 PM

## 2021-06-23 NOTE — Progress Notes (Signed)
PROGRESS NOTE   Subjective/Complaints: Pt asked why he needs "more surgery" on his wounds. Denied any new issues this morning  ROS: Patient denies fever, rash, sore throat, blurred vision, nausea, vomiting, diarrhea, cough, shortness of breath or chest pain, joint or back pain, headache, or mood change.    Objective:   No results found. Recent Labs    06/23/21 0512  WBC 8.1  HGB 10.5*  HCT 33.2*  PLT 353    Recent Labs    06/23/21 0512  NA 137  K 5.4*  CL 109  CO2 24  GLUCOSE 95  BUN 42*  CREATININE 1.86*  CALCIUM 11.9*     Intake/Output Summary (Last 24 hours) at 06/23/2021 1048 Last data filed at 06/23/2021 0700 Gross per 24 hour  Intake 974 ml  Output --  Net 974 ml      Pressure Injury 06/12/21 Back Mid;Left Stage 2 -  Partial thickness loss of dermis presenting as a shallow open injury with a red, pink wound bed without slough. Left side mid back, stage 2, layer of skin missing, covered with foam dressing, area pink. (Active)  06/12/21 1600  Location: Back  Location Orientation: Mid;Left  Staging: Stage 2 -  Partial thickness loss of dermis presenting as a shallow open injury with a red, pink wound bed without slough.  Wound Description (Comments): Left side mid back, stage 2, layer of skin missing, covered with foam dressing, area pink.  Present on Admission: Yes    Physical Exam: Vital Signs Blood pressure 123/61, pulse 67, temperature 98 F (36.7 C), temperature source Oral, resp. rate 20, height 6\' 4"  (1.93 m), weight 113.8 kg, SpO2 96 %. Constitutional: No distress . Vital signs reviewed. HEENT: EOMI, oral membranes moist Neck: supple Cardiovascular: RRR without murmur. No JVD    Respiratory/Chest: CTA Bilaterally without wheezes or rales. Normal effort    GI/Abdomen: BS +, non-tender, non-distended Ext: no clubbing, cyanosis, or edema Psych: pleasant and generally  cooperative Musculoskeletal:        General: trace LE present.    Cervical back: Normal range of motion and neck supple.   RUE- 5/5- in all muscles checked from proximal to distal LUE_ at least 3+/5- cannot tolerate any pressure on LUE to test strength further RLE- 5/5- in all muscle tested LLE- at least 3/5 in HF, and KF/KE- DF and PF at least 4/5- appears slightly less than RLE Decreased ROM of L knee- hard to tell if muscular/skin related (due to skin necrosis/pain) or if actually has lost ROM - cannot flex L knee to 90 degrees.    Skin:    Comments: Trace LLE edema and L hand edema- not pitting- no change Dressing on LUE/LLE C/D/I Very deep purple where blisters have denuded- with some associated necrosis at both wrist and leg On both limbs- has kerlex dressings in place C/D/I L leg and forearm Skin tear L thoracic area- no skin breakdown seen on backside Missing 2 front teeth.  Has partial plate Neurological:.    alert and oriented. No focal CN findings. Slow and measured. Able to follow simple commands.   Delayed to respond to questions and to ask questions-  significant delay noted Tremor in LUE with movement/reaching- intention tremor          Assessment/Plan: 1. Functional deficits which require 3+ hours per day of interdisciplinary therapy in a comprehensive inpatient rehab setting. Physiatrist is providing close team supervision and 24 hour management of active medical problems listed below. Physiatrist and rehab team continue to assess barriers to discharge/monitor patient progress toward functional and medical goals  Care Tool:  Bathing    Body parts bathed by patient: Right arm, Left arm, Chest, Abdomen, Front perineal area, Right upper leg, Left upper leg, Face   Body parts bathed by helper: Left lower leg, Right lower leg, Buttocks     Bathing assist Assist Level: Minimal Assistance - Patient > 75%     Upper Body Dressing/Undressing Upper body dressing    What is the patient wearing?: Pull over shirt    Upper body assist Assist Level: Supervision/Verbal cueing    Lower Body Dressing/Undressing Lower body dressing      What is the patient wearing?: Pants     Lower body assist Assist for lower body dressing: Supervision/Verbal cueing     Toileting Toileting    Toileting assist Assist for toileting: Minimal Assistance - Patient > 75% Assistive Device Comment: urinal   Transfers Chair/bed transfer  Transfers assist     Chair/bed transfer assist level: Contact Guard/Touching assist     Locomotion Ambulation   Ambulation assist      Assist level: Contact Guard/Touching assist Assistive device: Walker-rolling Max distance: 360   Walk 10 feet activity   Assist     Assist level: Supervision/Verbal cueing Assistive device: Walker-rolling   Walk 50 feet activity   Assist    Assist level: Contact Guard/Touching assist Assistive device: Walker-rolling    Walk 150 feet activity   Assist Walk 150 feet activity did not occur: Safety/medical concerns  Assist level: Contact Guard/Touching assist Assistive device: Walker-rolling    Walk 10 feet on uneven surface  activity   Assist Walk 10 feet on uneven surfaces activity did not occur: Safety/medical concerns (could not tolerate assessment due to fatigue)         Wheelchair     Assist Will patient use wheelchair at discharge?: No             Wheelchair 50 feet with 2 turns activity    Assist            Wheelchair 150 feet activity     Assist          Blood pressure 123/61, pulse 67, temperature 98 F (36.7 C), temperature source Oral, resp. rate 20, height 6\' 4"  (1.93 m), weight 113.8 kg, SpO2 96 %.  Medical Problem List and Plan: 1.  Anoxic brain injury secondary to cardiac arrest with CPR >90 minutes! S/P pressors with LUE/LLE wounds             -patient may not shower due to life vest-              -ELOS/Goals:  2-2.15 weeks- supervision to mod I- needs PT, OT and SLP- pt doesn't have insight into memory/cognitive issues.  Conttinue CIR- PT, OT and SLP Grounds pass prn  2.  Antithrombotics: -DVT/anticoagulation:  Pharmaceutical: Other (comment) --Eliquis.             -antiplatelet therapy: ASA 3. Chest wall pain: Continue Oxycodone prn for chest pain- hx of taking oxy at home chronically, but stopped prior to admission due to constipation.  6/28- loves Gabapentin 300 mg QHS for nerve pain- con't regimen  7/1- Chest pain is NON CARDIAC_ reproduces with palpation- con't pain regimen 4. Mood: LCSW to follow for evaluation and support.              -antipsychotic agents: N/A 5. Neuropsych: This patient is? capable of making decisions on his own behalf. 6. Skin/Wound Care: local care to denuded blisters.             --continue Vitamin C. Will add protein supplements to promote wound healing.   7/2 reviewed wounds with pt. Discussed with him that there are areas in both left-sided wounds with necrotic tissue which would benefit from I&D. Plastics is planning on taking him to OR next week.  7. Fluids/Electrolytes/Nutrition: Monitor I/O. Check lytes in am. 8. CAD/ICM: Treated medically --avoid nephrotoxic meds             --On BIDIL, coreg, ASA, Lipitor.             --monitor for symptoms with increase in activity.  9. Left forearm/left shin ulcers due to blistering/pressors/necrosis: Continue xeroform with dry dressing changes daily.  6/24- assessed this AM- look better than expected- con't wound care- will con't to monitor  7/2- Got bedside debridement by Plastics- will likely go to OR 7/6 for additional debridement- pt deciding---reviewed with pt again today  10. Constipation:On Senna and miralax-->will increase miralax to bid. Senna increased to BID  6/29- added Linzess prn- and con't regimen  6/20- will give Sorbitol 60 cc after therapy today  7/1-has good BM- yesterday- con't regimen 11. Neuropathy:  Will start low dose gabapentin at nights and titrate upwards as tolerated.             --had psychosis with Lyrica.  6/28- will con't gabapentin 300 mg qhs-   12. Hyperglycemia:  Hgb A1C- 5.6: decrease CBGs to daily.  13. PAF: Monitor HR tid--continue amiodarone, Coreg and Eliquis 14. VT/VF: LifeVest in place--will need continued education on management.             --Amiodarone 400 mg bid-->100 mg on 06/29  6/24- advised pt not to remove Life Vest- wrote order to do same  7/2 has an extra vest to switch out 15. AKI: Resolving. Continue to avoid nephrotoxic drugs and monitor for recovery. Cr down to 1.86- continue to monitor weekly.  16. COPD: Stable without MDI at this time.             --monitor for symptoms with increase in activity. 17. sCHF- with EF of 25%- continue to monitor daily weights and monitor   Filed Weights   06/18/21 0440 06/19/21 0500 06/22/21 0500  Weight: 109.5 kg 115.2 kg 113.8 kg   18. Left thumb pain/weakness: discussed prognosis, ordered ice to forearm, lidocaine jelly to thumb. 19. Constipation: continue Linzess PRN 20. Hyperkalemia: Kayexalate 49mL today, repeat BMP tomorrow.    LOS: 11 days A FACE TO FACE EVALUATION WAS PERFORMED  Clide Deutscher Karie Skowron 06/23/2021, 10:48 AM

## 2021-06-23 NOTE — Progress Notes (Signed)
Speech Language Pathology Daily Session Note  Patient Details  Name: Ross [name] MRN: [id number] Date of Birth: 03-Oct-1956  Today's Date: 06/23/2021 SLP Individual Time: 1117-1200 SLP Individual Time Calculation (min): 43 min  Short Term Goals: Week 2: SLP Short Term Goal 1 (Week 2): STG=LTG's (planned discharge from CIR on 7/8)  Skilled Therapeutic Interventions: Skilled ST services focused on cognitive skills. Pt had not recorded events in memory notebook, said " he didn't leave me a pen," but showed poor problem solving skills not requesting a pen from other staff members. Pt was able to recall am PT events and record in notebook with extra time. Pt expressed need to use restroom, but demonstrated poor safety awareness, refusing to wear gait belt nor allowing SLP to turn off chair alarm. Pt ambulated mod I and expressed he is getting up to go to the bathroom on his own when the alarm is not set. SLP provided education pertaining to safety plan, calling for assistance and use of gait belt. SLP facilitated mildly complex problem solving, error awareness and recall in calendar task. Pt was able to record events with enough detail however, required mod A verbal cues, placing events on incorrect dates due to poor recall and not noting events occuring over several days. Pt demonstrated reduce mental flexibility in stating he never marked things on his calendar over several days, he could just remember, however when questioned pt was unable to recall events that were not marked ( ex: brother in law in town for 4 days, only marked first.) Pt expressed again he did not feel he needed to changing methods of doing tasks, even after SLP provided education in changes to short term recall due to acute deficits. Continued education is needed. Pt was left with call bell within reach and chair alarm set. SLP communicated with NT to check in on pt and of refusing safety protocol. Recommend to continue ST services.       Pain Pain Assessment Pain Scale: 0-10 Pain Score: 0-No pain Pain Type: Acute pain Pain Location: Knee Pain Orientation: Left Pain Descriptors / Indicators: Sharp;Shooting Pain Frequency: Constant Pain Onset: With Activity Patients Stated Pain Goal: 3 Pain Intervention(s): Medication (See eMAR);Emotional support;Rest (Nursing administered medication during session)  Therapy/Group: Individual Therapy  Ross [name]  [facility] 06/23/2021, 12:14 PM

## 2021-06-23 NOTE — Progress Notes (Signed)
Pt anxious this evening. Pt states "someone told me I have a weak heart today" Pt requesting this nurse to read his notes about his heart. This nurse encouraged pt to speak to his cardiologist about his cardiac history. Gave pt a Xanax for his anxiety and listened to his concerns.

## 2021-06-23 NOTE — Progress Notes (Signed)
Physical Therapy Session Note  Patient Details  Name: Ross [name] MRN: [id number] Date of Birth: March 13, 1956  Today's Date: 06/23/2021 PT Individual Time: 0800-0920 PT Individual Time Calculation (min): 80 min   Short Term Goals: Week 1:  PT Short Term Goal 1 (Week 1): Pt will perform bed to/from wc transfers w/LRAD and cga PT Short Term Goal 1 - Progress (Week 1): Met PT Short Term Goal 2 (Week 1): Pt will perform supine to/from sit w/min to cga PT Short Term Goal 2 - Progress (Week 1): Met PT Short Term Goal 3 (Week 1): Pt will ambulate 180f w/LRAD and cga PT Short Term Goal 3 - Progress (Week 1): Met PT Short Term Goal 4 (Week 1): Pt will ascend/descend 8 stairs w/one rail w/min assist Week 2:  PT Short Term Goal 1 (Week 2): Pt will ascend/ descend 8 stairs with 1 handrail and supervision PT Short Term Goal 2 (Week 2): Pt will walk x300 ft with no AD and supervision PT Short Term Goal 3 (Week 2): Pt will navigate community environment with LRAD and supervision.  Skilled Therapeutic Interventions/Progress Updates:    Pt greeted in bed and agreeable to therapy. Pt requested to use the bathroom and brush teeth before beginning session. Pt completed bed mobility, Sit to stand, and 3/3 toileting tasks with supervision and no AD. Pt demonstrated furniture walking when available and and stated he felt more unsteady today. Pt reported 7/10 pain in L arm, L leg, and back, nursing administered morning medication during session, including pain meds. Pt had onset of L knee pain during Sit to stand, which he described as "spasms" in the knee. Pain addressed with rest and distraction. Pt propelled w/c x 300 ft with supervision, HR=66. Pt directed in stepping activity with colored targets. Pt became discouraged and refused to continue with activity after first repetition. Pt was then directed in collecting cones from around  the room with supervision, with exception to picking up cone from floor with CGA.  Pt then completed 2x5 min on nustep at level 5. HR during rest break=108 bpm. Pt was then transported back to room and completed ambulatory transfer to recliner, with supervision and utilizing furniture walking. Pt was left with all needs in reach and chair alarm active.    Therapy Documentation Precautions:  Precautions Precautions: Fall Precaution Comments: Lifevest Restrictions Weight Bearing Restrictions: No     Therapy/Group: Individual Therapy  OMickel Fuchs7/03/2021, 7:58 AM

## 2021-06-23 NOTE — Progress Notes (Signed)
Last bm charted/reported was 06/19/21. Patient has active bowel sounds in all quadrants. Patient is compliant with taking Miralax BID and Senna 2 tabs daily. This nurse offered to administer PRN Linzess this morning d/t no bm in multiple days. Patient states he had a bm 06/22/21 during therapy and stated "I want to hold off on the [place]."  Nurse educated patient on importance of taking Linzess at least 30 minutes before eating. Patient again refused medication. Patient is currently sitting on the side of the bed drinking fluids. Bed in low position and call button within reach.

## 2021-06-24 LAB — BASIC METABOLIC PANEL
Anion gap: 4 — ABNORMAL LOW (ref 5–15)
BUN: 37 mg/dL — ABNORMAL HIGH (ref 8–23)
CO2: 25 mmol/L (ref 22–32)
Calcium: 11.8 mg/dL — ABNORMAL HIGH (ref 8.9–10.3)
Chloride: 107 mmol/L (ref 98–111)
Creatinine, Ser: 1.64 mg/dL — ABNORMAL HIGH (ref 0.61–1.24)
GFR, Estimated: 46 mL/min — ABNORMAL LOW (ref >60)
Glucose, Bld: 100 mg/dL — ABNORMAL HIGH (ref 70–99)
Potassium: 5 mmol/L (ref 3.5–5.1)
Sodium: 136 mmol/L (ref 135–145)

## 2021-06-24 LAB — GLUCOSE, CAPILLARY: Glucose-Capillary: 87 mg/dL (ref 70–99)

## 2021-06-24 NOTE — Progress Notes (Signed)
Occupational Therapy Session Note  Patient Details  Name: Ross [name] MRN: [id number] Date of Birth: 14-May-1956  Today's Date: 06/24/2021 OT Individual Time: 8786-7672 OT Individual Time Calculation (min): 30 min    Short Term Goals: Week 2:  OT Short Term Goal 1 (Week 2): STGs = LTGs at Supervision  Skilled Therapeutic Interventions/Progress Updates:    Pt received in recliner and at start of session PA arrived to do dressing changes on L arm and leg. Therapy initiated afterwards. Encouraged pt to write down what PA discussed with pt about his wounds, surgery considerations, etc.  Pt declined and said "I dont need to do that as I remember things like this well".  Discussed pt's fear of going down the stairs.  He said he has practiced on the 4 steps in the gym but has a long staircase at home.  Suggested pt walk down hallway and look at stairwell and hopefully later today he can practice with his PT.  Pt stood and ambulated with supervision.  In stairwell pt assessed stairs and said going up would not be difficult but going down feels scary.  Suggested and then demonstrated side stepping down to use B hands on rails.  Pt agreed this could work better.   At one point in the stairwell, pt had a minor LOB with CGA to recover. He stated he just got "woozy" just THINKING about doing the stairs. Pt may need to do some deep breathing or relaxation techniques before descending the stairs.  He has not had a fall on the stairs in recent years.  Pt returned to room, resting in recliner to eat lunch.  Chair alarm on and all needs met.     Therapy Documentation Precautions:  Precautions Precautions: Fall Precaution Comments: Lifevest Restrictions Weight Bearing Restrictions: No      Pain: Pain Assessment Pain Scale: 0-10 Pain Score: 7  Pain Type: Acute pain Pain Location: Arm (arm, knee) Pain Orientation: Left Pain Descriptors / Indicators: Shooting Pain Frequency: Constant Pain  Onset: With Activity Patients Stated Pain Goal: 3 Pain Intervention(s): Medication (See eMAR) Multiple Pain Sites: Yes 2nd Pain Site Pain Score: 7 Pain Type: Acute pain Pain Location: Leg Pain Orientation: Left Pain Descriptors / Indicators: Shooting Pain Frequency: Constant Pain Onset: On-going Patient's Stated Pain Goal: 3 Pain Intervention(s): Medication (See eMAR) ADL: ADL Eating: Not assessed Grooming: Setup Where Assessed-Grooming: Edge of bed Upper Body Bathing: Supervision/safety Where Assessed-Upper Body Bathing: Edge of bed Lower Body Bathing: Moderate assistance Where Assessed-Lower Body Bathing: Edge of bed (perihygiene completed partially in seated using lateral leans due to pts increased feelings of anxiety with standing >10 seconds) Upper Body Dressing: Moderate assistance (shirt + LifeVest) Where Assessed-Upper Body Dressing: Edge of bed Lower Body Dressing: Moderate assistance Where Assessed-Lower Body Dressing: Edge of bed Toileting: Minimal assistance Where Assessed-Toileting: Bedside Commode Toilet Transfer: Minimal assistance Toilet Transfer Method: Stand pivot (without AD) Toilet Transfer Equipment: Bedside commode Tub/Shower Transfer: Not assessed   Therapy/Group: Individual Therapy  [place] 06/24/2021, 12:39 PM

## 2021-06-24 NOTE — Progress Notes (Signed)
PROGRESS NOTE   Subjective/Complaints: No chest pain at rest only with pressure or movement, discussed his renal function and potassium level today. Numbness in the left thumb and index finger ROS: Patient denies shortness of breath abdominal pains, positive swelling in feet, positive chest pain   Objective:   No results found. Recent Labs    06/23/21 0512  WBC 8.1  HGB 10.5*  HCT 33.2*  PLT 353     Recent Labs    06/23/21 0512 06/24/21 0705  NA 137 136  K 5.4* 5.0  CL 109 107  CO2 24 25  GLUCOSE 95 100*  BUN 42* 37*  CREATININE 1.86* 1.64*  CALCIUM 11.9* 11.8*      Intake/Output Summary (Last 24 hours) at 06/24/2021 9528 Last data filed at 06/24/2021 0700 Gross per 24 hour  Intake 590 ml  Output --  Net 590 ml       Pressure Injury 06/12/21 Back Mid;Left Stage 2 -  Partial thickness loss of dermis presenting as a shallow open injury with a red, pink wound bed without slough. Left side mid back, stage 2, layer of skin missing, covered with foam dressing, area pink. (Active)  06/12/21 1600  Location: Back  Location Orientation: Mid;Left  Staging: Stage 2 -  Partial thickness loss of dermis presenting as a shallow open injury with a red, pink wound bed without slough.  Wound Description (Comments): Left side mid back, stage 2, layer of skin missing, covered with foam dressing, area pink.  Present on Admission: Yes    Physical Exam: Vital Signs Blood pressure 132/63, pulse (!) 59, temperature 98.4 F (36.9 C), temperature source Oral, resp. rate 20, height 6\' 4"  (1.93 m), weight 111.9 kg, SpO2 94 %.  General: No acute distress Mood and affect are appropriate Heart: Regular rate and rhythm no rubs murmurs or extra sounds Lungs: Clear to auscultation, breathing unlabored, no rales or wheezes Abdomen: Positive bowel sounds, soft nontender to palpation, nondistended Extremities: No clubbing, cyanosis, or  edema Skin: No evidence of breakdown, no evidence of rash   Musculoskeletal:        General: trace LE present.    Cervical back: Normal range of motion and neck supple.   RUE- 5/5- in all muscles checked from proximal to distal LUE_ at least 3+/5- cannot tolerate any pressure on LUE to test strength further RLE- 5/5- in all muscle tested LLE- at least 3/5 in HF, and KF/KE- DF and PF at least 4/5- appears slightly less than RLE Decreased ROM of L knee- hard to tell if muscular/skin related (due to skin necrosis/pain) or if actually has lost ROM - cannot flex L knee to 90 degrees.    Skin:    Comments: 1+LLE edema Dressing on LUE/LLE C/D/I   Missing 2 front teeth.  Has partial plate Neurological:.    alert and oriented. No focal CN findings. Slow and measured. Able to follow simple commands.   Delayed to respond to questions and to ask questions- significant delay noted Tremor in LUE with movement/reaching- intention tremor Reduced sensation to light touch in the left index and thumb.        Assessment/Plan: 1. Functional  deficits which require 3+ hours per day of interdisciplinary therapy in a comprehensive inpatient rehab setting. Physiatrist is providing close team supervision and 24 hour management of active medical problems listed below. Physiatrist and rehab team continue to assess barriers to discharge/monitor patient progress toward functional and medical goals  Care Tool:  Bathing    Body parts bathed by patient: Right arm, Left arm, Chest, Abdomen, Front perineal area, Right upper leg, Left upper leg, Face   Body parts bathed by helper: Left lower leg, Right lower leg, Buttocks     Bathing assist Assist Level: Minimal Assistance - Patient > 75%     Upper Body Dressing/Undressing Upper body dressing   What is the patient wearing?: Pull over shirt    Upper body assist Assist Level: Supervision/Verbal cueing    Lower Body Dressing/Undressing Lower body  dressing      What is the patient wearing?: Pants     Lower body assist Assist for lower body dressing: Supervision/Verbal cueing     Toileting Toileting    Toileting assist Assist for toileting: Minimal Assistance - Patient > 75% Assistive Device Comment: urinal   Transfers Chair/bed transfer  Transfers assist     Chair/bed transfer assist level: Contact Guard/Touching assist     Locomotion Ambulation   Ambulation assist      Assist level: Contact Guard/Touching assist Assistive device: Walker-rolling Max distance: 360   Walk 10 feet activity   Assist     Assist level: Supervision/Verbal cueing Assistive device: Walker-rolling   Walk 50 feet activity   Assist    Assist level: Contact Guard/Touching assist Assistive device: Walker-rolling    Walk 150 feet activity   Assist Walk 150 feet activity did not occur: Safety/medical concerns  Assist level: Contact Guard/Touching assist Assistive device: Walker-rolling    Walk 10 feet on uneven surface  activity   Assist Walk 10 feet on uneven surfaces activity did not occur: Safety/medical concerns (could not tolerate assessment due to fatigue)         Wheelchair     Assist Will patient use wheelchair at discharge?: No             Wheelchair 50 feet with 2 turns activity    Assist            Wheelchair 150 feet activity     Assist          Blood pressure 132/63, pulse (!) 59, temperature 98.4 F (36.9 C), temperature source Oral, resp. rate 20, height 6\' 4"  (1.93 m), weight 111.9 kg, SpO2 94 %.  Medical Problem List and Plan: 1.  Anoxic brain injury secondary to cardiac arrest with CPR >90 minutes! S/P pressors with LUE/LLE wounds             -patient may not shower due to life vest-              -ELOS/Goals: 2-2.15 weeks- supervision to mod I- needs PT, OT and SLP- pt doesn't have insight into memory/cognitive issues.  Conttinue CIR- PT, OT and SLP Grounds  pass prn  2.  Antithrombotics: -DVT/anticoagulation:  Pharmaceutical: Other (comment) --Eliquis.             -antiplatelet therapy: ASA 3. Chest wall pain: Continue Oxycodone prn for chest pain- hx of taking oxy at home chronically, but stopped prior to admission due to constipation.   6/28- loves Gabapentin 300 mg QHS for nerve pain- con't regimen  7/1- Chest pain is NON CARDIAC_ reproduces  with palpation- con't pain regimen 4. Mood: LCSW to follow for evaluation and support.              -antipsychotic agents: N/A 5. Neuropsych: This patient is? capable of making decisions on his own behalf. 6. Skin/Wound Care: local care to denuded blisters.             --continue Vitamin C. Will add protein supplements to promote wound healing.   7/2 reviewed wounds with pt. Discussed with him that there are areas in both left-sided wounds with necrotic tissue which would benefit from I&D. Plastics is planning on taking him to OR next week.  7. Fluids/Electrolytes/Nutrition: Monitor I/O. Check lytes in am. 8. CAD/ICM: Treated medically --avoid nephrotoxic meds             --On BIDIL, coreg, ASA, Lipitor.             --monitor for symptoms with increase in activity.  9. Left forearm/left shin ulcers due to blistering/pressors/necrosis: Continue xeroform with dry dressing changes daily.  6/24- assessed this AM- look better than expected- con't wound care- will con't to monitor  7/2- Got bedside debridement by Plastics- will likely go to OR 7/6 for additional debridement-  10. Constipation:On Senna and miralax-->will increase miralax to bid. Senna increased to BID  6/29- added Linzess prn- and con't regimen  6/20- will give Sorbitol 60 cc after therapy today  7/1-has good BM- yesterday- con't regimen 11. Neuropathy: Will start low dose gabapentin at nights and titrate upwards as tolerated.           has left superficial radial neuropathy likely from IV infiltration with swelling and/or vasospasm  6/28-  will con't gabapentin 300 mg qhs-   12. Hyperglycemia:  Hgb A1C- 5.6: decrease CBGs to daily.  13. PAF: Monitor HR tid--continue amiodarone, Coreg and Eliquis 14. VT/VF: LifeVest in place--will need continued education on management.             --Amiodarone 400 mg bid-->100 mg on 06/29  6/24- advised pt not to remove Life Vest- wrote order to do same  7/2 has an extra vest to switch out 15. AKI: Resolving. Continue to avoid nephrotoxic drugs and monitor for recovery. Cr down to 1.64 16. COPD: Stable without MDI at this time.             --monitor for symptoms with increase in activity. 17. sCHF- with EF of 25%- continue to monitor daily weights and monitor, as discussed with patient this is cause of his leg swelling   Filed Weights   06/19/21 0500 06/22/21 0500 06/24/21 0455  Weight: 115.2 kg 113.8 kg 111.9 kg   18. Left thumb pain/weakness: discussed prognosis, ordered ice to forearm, lidocaine jelly to thumb. 19. Constipation: continue Linzess PRN 20. Hyperkalemia: Kayexalate 28mL on 7/4 repeat BMP potassium back to the normal range   LOS: 12 days A FACE TO FACE EVALUATION WAS PERFORMED  Charlett Blake 06/24/2021, 8:33 AM

## 2021-06-24 NOTE — Progress Notes (Signed)
Occupational Therapy Session Note  Patient Details  Name: Ross [name] MRN: [id number] Date of Birth: Aug 17, 1956  Today's Date: 06/24/2021 OT Individual Time: 7195-9747 OT Individual Time Calculation (min): 46 min  and Today's Date: 06/24/2021 OT Missed Time: 14 Minutes Missed Time Reason: Other (comment) (meeting with SW)   Short Term Goals: Week 1:  OT Short Term Goal 1 (Week 1): Pt will complete an ambulatory toilet transfer using LRAD and no more than Min A OT Short Term Goal 1 - Progress (Week 1): Met OT Short Term Goal 2 (Week 1): Pt will complete LB dressing with Min A using adaptive strategies as needed OT Short Term Goal 2 - Progress (Week 1): Met OT Short Term Goal 3 (Week 1): Pt will complete 1 grooming task while standing at the sink for more than 10 sec to increase functional standing endurance OT Short Term Goal 3 - Progress (Week 1): Met Week 2:  OT Short Term Goal 1 (Week 2): STGs = LTGs at Supervision   Skilled Therapeutic Interventions/Progress Updates:    Pt greeted at time of session sitting up in recliner finishing up conversation with SW regarding DC planning. Missed 14 mins of OT at beginning of session. Walked recliner > bathroom CGA/close supervision and standing to urinate same manner. Walked bathroom > sink for washing hands and briefly shaving mustache in same manner. Pt attempting to walk all the way to gym and ride elevator in standing, but agreeable to sit in wheelchair after walking to elevators approx 50 feet from room. Pt having questions regarding Supervision recommendations, spoke with pt and SO Marianna in private room regarding Supervision, regarding anoxic brain injury, need for supervision for safety, etc. And pt more agreeable after this conversation. Discussed DME regular [facility] vs wide and understanding would not be covered by insurance, will provide a hand out to purchase later on if desired. In gym, pt mostly concerned about stairs at home. Practiced  step up/downs for cardio endurance and partial stairs to access upstairs for ADLs at home, only going up 2 stairs R hand rail only for 3 trials and pt with no complaints. SO Marianna asking about stairs at home, recommended speak with PT with further questions. Pt back in room up in recliner alarm on call bell in reach.   Therapy Documentation Precautions:  Precautions Precautions: Fall Precaution Comments: Lifevest Restrictions Weight Bearing Restrictions: No     Therapy/Group: Individual Therapy  Viona Gilmore 06/24/2021, 3:37 PM

## 2021-06-24 NOTE — Progress Notes (Signed)
Subjective: 65 year old male with left lower extremity left upper extremity wound after extravasation of Levophed.  He has some skin necrosis and thickened eschar of the left upper extremity and left lower extremity.  He reports ongoing decree sensation to the left thumb and left second finger.  He reports he feels as if his wounds are improving with wound care.  He reports that he would like to avoid surgery and is very fearful of going to under anesthesia for surgery.  He reports that he has chronic issues with his left knee, reports he has had these issues since he was a child.  Objective: Vital signs in last 24 hours: Temp:  [98 F (36.7 C)-98.4 F (36.9 C)] 98.4 F (36.9 C) (07/05 0455) Pulse Rate:  [59-77] 59 (07/05 0455) Resp:  [17-20] 20 (07/05 0455) BP: (118-132)/(60-66) 132/63 (07/05 0455) SpO2:  [94 %-98 %] 94 % (07/05 0455) Weight:  [111.9 kg] 111.9 kg (07/05 0455) Last BM Date: 06/23/21  Intake/Output from previous day: 07/04 0701 - 07/05 0700 In: 590 [P.O.:590] Out: -  Intake/Output this shift: No intake/output data recorded.  General appearance: alert, cooperative, no distress, and sitting up at bedside chair, resting, OT at bedside Extremities:  Left upper extremity: Left forearm wound with some improvement, eschar beginning to improve with some fibrinous exudate noted at the wound edges.  No surrounding erythema or cellulitic changes noted.  There is tenderness to palpation.  Decreased sensation of left thumb and left index finger.  He is able to flex and extend his wrist.  He has normal range of motion of his 5 digits.  Palpable radial pulse and warm distal extremity noted.     Left lower extremity: Left lower extremity wound with some mild improvement.  No erythema or cellulitic changes.  Minimal tenderness to palpation.  Mild swelling noted.  Thickened eschar/necrotic tissue is noted, depth is unknown.  There is no strong foul odor.  There is no crepitus  noted.  The necrotic tissue is adherent to the wound bed.  Left medial distal thigh wound appears to be healing well, no surrounding erythema.    Pulses: 2+ and symmetric   Lab Results:  CBC Latest Ref Rng & Units 06/23/2021 06/13/2021 06/07/2021  WBC 4.0 - 10.5 K/uL 8.1 11.6(H) 10.1  Hemoglobin 13.0 - 17.0 g/dL 10.5(L) 10.5(L) 11.9(L)  Hematocrit 39.0 - 52.0 % 33.2(L) 31.7(L) 35.9(L)  Platelets 150 - 400 K/uL 353 432(H) 203    BMET Recent Labs    06/23/21 0512 06/24/21 0705  NA 137 136  K 5.4* 5.0  CL 109 107  CO2 24 25  GLUCOSE 95 100*  BUN 42* 37*  CREATININE 1.86* 1.64*  CALCIUM 11.9* 11.8*   PT/INR No results for input(s): LABPROT, INR in the last 72 hours. ABG No results for input(s): PHART, HCO3 in the last 72 hours.  Invalid input(s): PCO2, PO2  Studies/Results: No results found.  Anti-infectives: Anti-infectives (From admission, onward)    None       Assessment/Plan:  65 year old male with left upper extremity and left lower extremity wounds after extravasation of Levophed.  Patient reports he feels as if his wounds are improving.  He is fearful of undergoing anesthesia and would like to avoid surgery if at all possible.  He had some time over the past 5 days to think this over and he seems fairly confident that he would like to avoid surgery.  I did discuss with him that that is certainly a possibility and  we can certainly wait to see if this will improve on its own, however he may need surgical intervention at a later time if there is minimal improvement.  He is also aware that he could develop an infection related to the necrotic tissue but that is not something that I could guarantee would or would not happen.  He seemed to be understanding of this.  I discussed with him that I would recommend he follow-up with [place] in the office 1 to 2 weeks after discharge, I would like him to have some assistance with home health for dressing changes and monitoring of the  wound.   Recommend Vaseline followed by Xeroform to thickened eschar of left wrist and left lower extremity daily.  This should be covered with 4 x 4 gauze or nonstick gauze followed by Kerlix and Ace wraps.  Appreciate nursing assistance.  Consult to case management placed for assistance with placement of home health.  At this time we will forego surgical intervention and cancel tomorrow surgery.  We will continue to monitor. Pictures were obtained of the patient and placed in the chart with the patient's or guardian's permission.    LOS: 12 days    Charlies Constable, PA-C 06/24/2021

## 2021-06-24 NOTE — Progress Notes (Signed)
Occupational Therapy Session Note  Patient Details  Name: Ross [name] MRN: [id number] Date of Birth: 1956-11-07  Today's Date: 06/24/2021 OT Individual Time: 1829-9371 OT Individual Time Calculation (min): 42 min    Short Term Goals: Week 2:  OT Short Term Goal 1 (Week 2): STGs = LTGs at Supervision  Skilled Therapeutic Interventions/Progress Updates:    Pt received asleep in bed, awakens with mod VCs, agreeable to therapy and req to wash hair. No c/o pain. Session focus on self-care retraining and func mobility with no AD. Came to sitting EOB with close S and increased time. STS and amb to and from toilet close S. Stood to urinate with close S. Sat at sink to wash hair, able to hold hair washing tray in place with min Vcs. Cleansed periarea standing at sink close S. Amb back to recliner close S. Ind recalling to bring life vest harness in between transfers. Encouraged pt to use memory notebook, pt states "they say I had a brain injury, but I don't know."   Pt left seated in recliner with bed alarm engaged, call bell in reach, and all immediate needs met.    Therapy Documentation Precautions:  Precautions Precautions: Fall Precaution Comments: Lifevest Restrictions Weight Bearing Restrictions: No  Pain:  denies ADL: See Care Tool for more details.   Therapy/Group: Individual Therapy  Volanda Napoleon MS, OTR/L  06/24/2021, 6:50 AM

## 2021-06-24 NOTE — Progress Notes (Signed)
Physical Therapy Session Note  Patient Details  Name: AARRON WIERZBICKI MRN: [id number] Date of Birth: [birth year]  Today's Date: 06/24/2021 PT Individual Time: 1400-1500 PT Individual Time Calculation (min): 60 min   Short Term Goals: Week 1:  PT Short Term Goal 1 (Week 1): Pt will perform bed to/from wc transfers w/LRAD and cga PT Short Term Goal 1 - Progress (Week 1): Met PT Short Term Goal 2 (Week 1): Pt will perform supine to/from sit w/min to cga PT Short Term Goal 2 - Progress (Week 1): Met PT Short Term Goal 3 (Week 1): Pt will ambulate 171ft w/LRAD and cga PT Short Term Goal 3 - Progress (Week 1): Met PT Short Term Goal 4 (Week 1): Pt will ascend/descend 8 stairs w/one rail w/min assist Week 2:  PT Short Term Goal 1 (Week 2): Pt will ascend/ descend 8 stairs with 1 handrail and supervision PT Short Term Goal 2 (Week 2): Pt will walk x300 ft with no AD and supervision PT Short Term Goal 3 (Week 2): Pt will navigate community environment with LRAD and supervision.  Skilled Therapeutic Interventions/Progress Updates:   Pt seated in recliner on arrival and agreeable to therapy with no complaint of pain. Pt states he had increased anxiety in a previous session when navigating full stairwell, agreeable to attempt gradual exposure before attempting again. Pt requested to use the bathroom before leaving room, ambulated to bathroom with close supervision and completed toileting and hand hygiene in the same manner. Pt then ambulated to stair well x 75 ft with close supervision. Discussed pt's fears and positive visualization in preparation for stair navigation. Pt ascended 12 steps and then 6 steps with CGA for pt comfort. Pt then descended steps, alternating between forward navigation with both handrails and laterally with R handrail per home setup. Pt expressed fear with lateral technique because her could see down into lower stairwell. Pt expressed need to go to another area, so was transported to  therapy gym and performed lateral step ups in // bars to improve strength and confidence with lateral stair navigation. Pt transported back to room and was left with chair alarm active and all needs in reach.  Therapy Documentation Precautions:  Precautions Precautions: Fall Precaution Comments: Lifevest Restrictions Weight Bearing Restrictions: No     Therapy/Group: Individual Therapy  Mickel Fuchs 06/24/2021, 7:18 PM

## 2021-06-24 NOTE — Patient Care Conference (Signed)
Inpatient RehabilitationTeam Conference and Plan of Care Update Date: 06/24/2021   Time: 11:40 AM    Patient Name: Ross [name]      Medical Record Number: [id number]  Date of Birth: 11/24/1956 Sex: Male         Room/Bed: 3M62H/4T65Y-65 Payor Info: Payor: [employer] / Plan: [employer]HC MEDICARE / Product Type: *No Product type* /    Admit Date/Time:  06/12/2021  3:38 PM  Primary Diagnosis:  Anoxic brain injury [facility])  Hospital Problems: Principal Problem:   Anoxic brain injury [facility]) Active Problems:   Chest pain of uncertain etiology   Persistent atrial fibrillation ([place])   Cardiac arrest ([place])   Pressure injury of skin   Skin necrosis [facility])    Expected Discharge Date: Expected Discharge Date: 06/27/21  Team Members Present: Physician leading conference: Dr. Alger Simons Care Coodinator Present: Ovidio Kin, LCSW;[place]ty Seto Creig Hines, RN, BSN, [place] Nurse Present: Dorthula Nettles, RN PT Present: Other (comment) Ailene Rud, PT) OT Present: Lillia Corporal, OT SLP Present: Other (comment) Charna Elizabeth, SLP) [place] Coordinator present : Gunnar Fusi, SLP     Current Status/Progress Goal Weekly Team Focus  Bowel/Bladder   Pt contient of B/B. LBM 06/23/2021  Regular BMs every 3 days of less  Assess B/B every shift and PRN   Swallow/Nutrition/ Hydration   dys 3 textures and thin liquids, supervision A - mod I  Mod I  regular trials, carryover of swallow strategies and pharyngeal exercises   ADL's   mostly sink bathing but did shower Min A for buttocks/feet, transfers CGA/CS with and without AD, LB dress Min A for LLE  Supervision  standing balance/tolerance, safety awareness, AE for ADL retraining as needed, continue family ed, functional transfers, DC planning   Mobility   supervision bed mobility, supervision/CGA gait, stairs min A  supervision  balance, confidence,problem solving, stairs   Communication             Safety/Cognition/ Behavioral Observations   Min A, reduced mental flexbility/poor awareness  Supervision A  complex-mildly complex problem solving, error awareness and recall   Pain   Pt with left arm pain rating a 7/10 before PRN pain medication  Pain level <3/10  Assess pain every shift and PRN   Skin   Wounds to left arm and left leg  No no breakdown or signs of infection to current wounds  Assess skin every shift and PRN     Discharge Planning:  Daughter needs to confirm plan for discharge end of week. Aware Dad will need 24/7 supervision at discharge. Try to schedule family education this week   Team Discussion: Will have I&D of wounds tomorrow. Continent B/B, constipated and given Kayexalate for K+ 5.4 on 06/23/21. No complaints of pain, just normal chest wall pain from CPR. Refusing to use [facility] equipment. Patient on target to meet rehab goals: yes, min assist to thread pants, transfers with contact guard assist. Walks to and from bathroom. RLE has limited mobility. Largely supervision, very anxious about falling and fearful. SLP reports tolerating dys 3, thin. Min assist for cognition, error awareness, and recall. Will need 24/7 supervision.  *See Care Plan and progress notes for long and short-term goals.   Revisions to Treatment Plan:  Scheduled for I&D of wounds tomorrow.  Teaching Needs: Family education, medication management, pain management, skin/wound care, constipation management, transfer training, gait training, balance training, endurance training, stair training, safety awareness.  Current Barriers to Discharge: Decreased caregiver support, Medical stability, Home enviroment access/layout,  Wound care, Lack of/limited family support, Weight, Weight bearing restrictions, Medication compliance, Behavior, and Nutritional means  Possible Resolutions to Barriers: Continue current medications, provide emotional support.     Medical Summary Current Status: anoxic brain injury. right arm and leg wounds--need further I&D  7/6 by plastics, persistent chest wall pain  Barriers to Discharge: Medical stability   Possible Resolutions to [employer] Focus: To OR with plastics 7/6. daily wound care, supervision for safety. pain mgt,   Continued Need for Acute Rehabilitation Level of Care: The patient requires daily medical management by a physician with specialized training in physical medicine and rehabilitation for the following reasons: Direction of a multidisciplinary physical rehabilitation program to maximize functional independence : Yes Medical management of patient stability for increased activity during participation in an intensive rehabilitation regime.: Yes Analysis of laboratory values and/or radiology reports with any subsequent need for medication adjustment and/or medical intervention. : Yes   I attest that I was present, lead the team conference, and concur with the assessment and plan of the team.   Cristi Loron 06/24/2021, 6:03 PM

## 2021-06-24 NOTE — Progress Notes (Signed)
Patient ID: Ross [name], male   DOB: 1956-04-20, 65 y.o.   MRN: [id number]  Met with pt and marianna-girlfriend and Angela-daughter at different times to discuss team conference goals of supervision and discharge 7/8. Made aware of our recommendation of 24/7 supervision for safety due to cognitive deficits. All wanted to know for how long and daughter wanted to talk with cardiology number given to her and told need to see in own environment to see how safe he is. Went over what insurance covers and what they don't. Pt will be going to [place] home and their are 14 steps up to second floor where bedroom and full bathroom is. He had difficulty before managing steps. Will ask PT to do stairs until pt is more comfortable. Encourage daughter to come in and attend therapies prior to discharge, she is in [facility]. Juliann Mule has and understands the safety component, but also feels some of this is his personality prior to admission. It seems they expect him to be what he was or better than he was prior to admission. Work on discharge for Friday.

## 2021-06-25 ENCOUNTER — Encounter (HOSPITAL_COMMUNITY)
Admission: RE | Disposition: A | Discharge status: Home Health | Payer: Self-pay | Source: Intra-hospital | Attending: Physical Medicine and Rehabilitation

## 2021-06-25 LAB — GLUCOSE, CAPILLARY: Glucose-Capillary: 99 mg/dL (ref 70–99)

## 2021-06-25 SURGERY — IRRIGATION AND DEBRIDEMENT EXTREMITY
Anesthesia: General | Site: Leg Lower | Laterality: Left

## 2021-06-25 MED ORDER — SALINE SPRAY 0.65 % NA SOLN: 1.0000 | NASAL | 0 refills | Status: DC | PRN

## 2021-06-25 MED ORDER — CEFAZOLIN SODIUM-DEXTROSE 2-4 GM/100ML-% IV SOLN: 2.0000 g | INTRAVENOUS | Status: AC

## 2021-06-25 MED ORDER — CHLORHEXIDINE GLUCONATE CLOTH 2 % EX PADS: 6.0000 | MEDICATED_PAD | Freq: Once | CUTANEOUS | Status: DC

## 2021-06-25 MED ORDER — ACETAMINOPHEN 325 MG PO TABS: 325.0000 mg | ORAL_TABLET | ORAL | Status: DC | PRN

## 2021-06-25 NOTE — Discharge Instructions (Addendum)
Inpatient Rehab Discharge Instructions  Ross [name] Discharge date and time: 06/26/21   Activities/Precautions/ Functional Status: Activity: no lifting, driving, or strenuous exercise till cleared by MD Diet: cardiac diet--low potassium foods Wound Care: Cleanse wound on left arm and left leg with normal saline, pat dry, apply a layer of vaseline, then cover with Xeroform guaze (purchase at the drug store or [place]) to avoid it from sticking to the wound. Cover with dry  gauze and ace wrap. Change dressing daily. Contact Dr. Marla Roe if wound worsens, starts having an odor, pus, redness or drainage.    Functional status:  ___ No restrictions     ___ Walk up steps independently _X__ 24/7 supervision/assistance   ___ Walk up steps with assistance ___ Intermittent supervision/assistance  ___ Bathe/dress independently ___ Walk with walker     __X_ Bathe/dress with assistance ___ Walk Independently    ___ Shower independently ___ Walk with assistance    ___ Shower with assistance _X__ No alcohol     ___ Return to work/school ________  Special Instructions: No driving for at least 6 months or till cleared by cardiology. 2. Wear your Life Vest at all times.  3. HHRN to draw BMET on Monday 07/11 with results to PCP and cardiology   COMMUNITY REFERRALS UPON DISCHARGE:    Home Health:   PT, OT, SP, RN                  [place] Phone; [phone]  Medical Equipment/Items [place] [address]                                                 Agency/Supplier:[facility] [phone]   My questions have been answered and I understand these instructions. I will adhere to these goals and the provided educational materials after my discharge from the hospital.  Patient/Caregiver Signature _______________________________ Date __________  Clinician Signature _______________________________________ Date __________  Please bring this form and your medication  list with you to all your follow-up doctor's appointments.  Information on my medicine - ELIQUIS (apixaban)  This medication education was reviewed with me or my healthcare representative as part of my discharge preparation.  The pharmacist that spoke with me during my hospital stay was:    Why was Eliquis prescribed for you? Eliquis was prescribed for you to reduce the risk of a blood clot forming that can cause a stroke if you have a medical condition called atrial fibrillation (a type of irregular heartbeat).  What do You need to know about Eliquis ? Take your Eliquis TWICE DAILY - one tablet in the morning and one tablet in the evening with or without food. If you have difficulty swallowing the tablet whole please discuss with your pharmacist how to take the medication safely.  Take Eliquis exactly as prescribed by your doctor and DO NOT stop taking Eliquis without talking to the doctor who prescribed the medication.  Stopping may increase your risk of developing a stroke.  Refill your prescription before you run out.  After discharge, you should have regular check-up appointments with your healthcare provider that is prescribing your Eliquis.  In the future your dose may need to be changed if your kidney function or weight changes by a significant amount or as you get older.  What do you do if you miss  a dose? If you miss a dose, take it as soon as you remember on the same day and resume taking twice daily.  Do not take more than one dose of ELIQUIS at the same time to make up a missed dose.  Important Safety Information A possible side effect of Eliquis is bleeding. You should call your healthcare provider right away if you experience any of the following: Bleeding from an injury or your nose that does not stop. Unusual colored urine (red or dark brown) or unusual colored stools (red or black). Unusual bruising for unknown reasons. A serious fall or if you hit your head (even if  there is no bleeding).  Some medicines may interact with Eliquis and might increase your risk of bleeding or clotting while on Eliquis. To help avoid this, consult your healthcare provider or pharmacist prior to using any new prescription or non-prescription medications, including herbals, vitamins, non-steroidal anti-inflammatory drugs (NSAIDs) and supplements.  This website has more information on Eliquis (apixaban): http://www.eliquis.com/eliquis/home

## 2021-06-25 NOTE — Progress Notes (Signed)
Speech Language Pathology Daily Session Note  Patient Details  Name: Ross [name] MRN: [id number] Date of Birth: Sep 21, 1956  Today's Date: 06/25/2021 SLP Individual Time: 1350-1430 SLP Individual Time Calculation (min): 40 min  Short Term Goals: Week 2: SLP Short Term Goal 1 (Week 2): STG=LTG's (planned discharge from CIR on 7/8)  Skilled Therapeutic Interventions:   Patient seen for skilled ST session focusing on cognitive function goals. Patient reported that he had a bad morning with several people all in room trying to do different things (nurse, therapist, surgical PA) and at the same time, his Life Vest was alarming and he was trying to fix it. Patient also was NPO this morning in anticipation of surgery of arm and leg wounds that he declined so he was not able to eat breakfast. Overall, SLP had significant difficulty trying to engage patient in meaningful conversation. He denied every telling SLP he was having memory difficulties but stated he is still "foggy headed". He did report concern regarding upcoming discharge this Friday, as he will living with girlfriend and her home has 14 stairs to bedroom and main bathroom/shower. Patient eventually was able to discuss further and demonstrate anticipatory awareness to current level of function. He continues to benefit from skilled SLP intervention to maximize cognitive function prior to discharge.  Pain Pain Assessment Pain Scale: 0-10 Faces Pain Scale: No hurt  Therapy/Group: Individual Therapy  Sonia Baller, MA, CCC-SLP Speech Therapy

## 2021-06-25 NOTE — Progress Notes (Signed)
Occupational Therapy Session Note  Patient Details  Name: Ross [name] MRN: [id number] Date of Birth: 06-Sep-1956  Today's Date: 06/25/2021 OT Individual Time: 1100-1155 OT Individual Time Calculation (min): 55 min    Short Term Goals: Week 2:  OT Short Term Goal 1 (Week 2): STGs = LTGs at Supervision   Skilled Therapeutic Interventions/Progress Updates:    Pt greeted at time of session sitting up in recliner agreeable to OT session, no c/o pain throughout. Pt initially c/o his morning with life vest alarming, not eating breakfast, etc having a very disorganized morning. Agreeable to OT session with encouragement. Provided print out for bariatric [facility] for pt as he/significant other would like one of these but is not covered by insurance. Standard BSC in room from being dropped off, reviewed with pt how to set up. Walked recliner <> sink level for standing grooming with Supervision and seated for hair washing sink level as pt declined full ADL today for bathing/changing clothes. Pt set up with alarm on call bell in reach.    Therapy Documentation Precautions:  Precautions Precautions: Fall Precaution Comments: Lifevest Restrictions Weight Bearing Restrictions: No     Therapy/Group: Individual Therapy  Viona Gilmore 06/25/2021, 7:19 AM

## 2021-06-25 NOTE — Progress Notes (Signed)
Patient ID: Ross [name], male   DOB: 02-08-1956, 65 y.o.   MRN: [id number]  Spoke with daughter to inform of finding home health to accept Dad and equipment ordered for him. Aware still discharging on Friday and will go over instructions once here Friday.

## 2021-06-25 NOTE — Progress Notes (Signed)
Physical Therapy Session Note  Patient Details  Name: Ross [name] MRN: [id number] Date of Birth: [birth year]  Today's Date: 06/25/2021 PT Individual Time: 0930-1033 PT Individual Time Calculation (min): 63 min   Short Term Goals: Week 2:  PT Short Term Goal 1 (Week 2): Pt will ascend/ descend 8 stairs with 1 handrail and supervision PT Short Term Goal 2 (Week 2): Pt will walk x300 ft with no AD and supervision PT Short Term Goal 3 (Week 2): Pt will navigate community environment with LRAD and supervision.  Skilled Therapeutic Interventions/Progress Updates: Pt presented in recliner agreeable to therapy. Pt states frustrated due to not having meal or meds (was scheduled for surgery/cancelled). Pt agreeable to gentle seated BLE therex for knee pain as also expressed frustration on not being able to perform activities in previous session that he was able to perform yesterday. Pt performed LAQ 3sec hold 2 x 10 bilaterally, hamstring stretch 30sec x 3. PTA also performed grade l-ll A-P distractions for pain management which pt indicated provided much improvement. Nsg arrived to provide meds and indicated NPO status removed, pt and PTA called food services to confirm that lunch with be provided to pt and NT provided snack. Once completed pt agreeable to ambulation in hallway as knee pain somewhat improved. Pt ambulated without AD to bathroom and performed toileting with distant supervision then returning to sink to perform hand hygiene in same manner. Pt requesting to use RW for ambulation due to B knee pain. Pt ambulated ~111f with RW and supervision. Pt returned to recliner and indicated continues to have pain but improved from beginning of session. Pt then participated in standing hip abd, hamstring curls (within limits of pain) and heel rises x 2 10. Pt requesting HEP with exercises performed afterwards with primary PT notified. Pt left in recliner at end of session with seat alarm on, call bell within  reach and needs met.   Therapy Documentation Precautions:  Precautions Precautions: Fall Precaution Comments: Lifevest Restrictions Weight Bearing Restrictions: No General:   Vital Signs: Therapy Vitals Pulse Rate: 60 BP: 133/80 Pain: Pain Assessment Pain Scale: 0-10 Pain Score: 8  Pain Type: Acute pain Pain Location: Knee Pain Orientation: Left;Right Pain Descriptors / Indicators: Spasm Pain Onset: With Activity Pain Intervention(s): Emotional support;Rest;Distraction   Therapy/Group: Individual Therapy  Kymberley Raz [name], PTA  06/25/2021, 11:04 AM

## 2021-06-25 NOTE — Progress Notes (Signed)
Physical Therapy Session Note  Patient Details  Name: Ross [name] MRN: [id number] Date of Birth: March 30, 1956  Today's Date: 06/25/2021 PT Individual Time: 0800-0852 PT Individual Time Calculation (min): 52 min   Short Term Goals: Week 1:  PT Short Term Goal 1 (Week 1): Pt will perform bed to/from wc transfers w/LRAD and cga PT Short Term Goal 1 - Progress (Week 1): Met PT Short Term Goal 2 (Week 1): Pt will perform supine to/from sit w/min to cga PT Short Term Goal 2 - Progress (Week 1): Met PT Short Term Goal 3 (Week 1): Pt will ambulate 158ft w/LRAD and cga PT Short Term Goal 3 - Progress (Week 1): Met PT Short Term Goal 4 (Week 1): Pt will ascend/descend 8 stairs w/one rail w/min assist Week 2:  PT Short Term Goal 1 (Week 2): Pt will ascend/ descend 8 stairs with 1 handrail and supervision PT Short Term Goal 2 (Week 2): Pt will walk x300 ft with no AD and supervision PT Short Term Goal 3 (Week 2): Pt will navigate community environment with LRAD and supervision.  Skilled Therapeutic Interventions/Progress Updates:    Pt seated in recliner with nursing present to assess life vest. Pt stated that he has not had breakfast yet, nursing discovers that pt is NPO. Pt continued to perseverate on not wanting to take medication on an empty stomach, and that his surgery was supposed to be cancelled but did not appear to be. During session, pt also stated that he woke up to the life vest alarming because it had become disconnected. Pt reported knee pain at 8-9/10 after gait x 50 ft, which was addressed with rest, distraction, and emotional support. Because of pt's mental state, session focused on improving pt outlook and building self efficacy.   Pt requested to try wearing shoes this session, donned with set up. Pt stated that they felt "terrible" throughout session. Therapist suggested asking someone to bring in regular socks to try. Pt requested to use the bathroom, ambulated to bathroom and  completed toileting with close supervision. Gait x 50 ft with close supervision, discontinued due to knee pain as mentioned above. Pt agreeable to attempt step ups with RLE, completed ~10 but was discontinued due to LLE pain and pt frustration. Pt propelled w/c x 90 ft to improve cardiovascular endurance and functional mobility. Pt returned to recliner after session with close supervision and was left with chair alarm active, all needs in reach.  Therapy Documentation Precautions:  Precautions Precautions: Fall Precaution Comments: Lifevest Restrictions Weight Bearing Restrictions: No     Therapy/Group: Individual Therapy  Mickel Fuchs 06/25/2021, 9:49 AM

## 2021-06-25 NOTE — Progress Notes (Signed)
PROGRESS NOTE   Subjective/Complaints: Pt decline I and D per plastics, they plan to see him in office in 1-2 wk post d/c  Upset that NPO order was not cancelled and he missed breakfast this am  ROS: Patient denies shortness of breath abdominal pains, positive swelling in feet, positive chest pain   Objective:   No results found. Recent Labs    06/23/21 0512  WBC 8.1  HGB 10.5*  HCT 33.2*  PLT 353     Recent Labs    06/23/21 0512 06/24/21 0705  NA 137 136  K 5.4* 5.0  CL 109 107  CO2 24 25  GLUCOSE 95 100*  BUN 42* 37*  CREATININE 1.86* 1.64*  CALCIUM 11.9* 11.8*      Intake/Output Summary (Last 24 hours) at 06/25/2021 0858 Last data filed at 06/24/2021 1725 Gross per 24 hour  Intake 413 ml  Output --  Net 413 ml       Pressure Injury 06/12/21 Back Mid;Left Stage 2 -  Partial thickness loss of dermis presenting as a shallow open injury with a red, pink wound bed without slough. Left side mid back, stage 2, layer of skin missing, covered with foam dressing, area pink. (Active)  06/12/21 1600  Location: Back  Location Orientation: Mid;Left  Staging: Stage 2 -  Partial thickness loss of dermis presenting as a shallow open injury with a red, pink wound bed without slough.  Wound Description (Comments): Left side mid back, stage 2, layer of skin missing, covered with foam dressing, area pink.  Present on Admission: Yes    Physical Exam: Vital Signs Blood pressure 121/60, pulse 62, temperature 98.5 F (36.9 C), temperature source Oral, resp. rate 18, height 6\' 4"  (1.93 m), weight 113.2 kg, SpO2 95 %.  General: No acute distress Mood and affect are appropriate Heart: Regular rate and rhythm no rubs murmurs or extra sounds Lungs: Clear to auscultation, breathing unlabored, no rales or wheezes Abdomen: Positive bowel sounds, soft nontender to palpation, nondistended Extremities: No clubbing, cyanosis, or  edema Skin: No evidence of breakdown, no evidence of rash   Musculoskeletal:        General: trace LE present.    Cervical back: Normal range of motion and neck supple.   RUE- 5/5- in all muscles checked from proximal to distal LUE_ at least 3+/5- cannot tolerate any pressure on LUE to test strength further RLE- 5/5- in all muscle tested LLE- at least 3/5 in HF, and KF/KE- DF and PF at least 4/5- appears slightly less than RLE Decreased ROM of L knee- hard to tell if muscular/skin related (due to skin necrosis/pain) or if actually has lost ROM - cannot flex L knee to 90 degrees.    Skin:    Comments: 1+LLE edema Dressing on LUE/LLE C/D/I   Missing 2 front teeth.  Has partial plate Neurological:.    alert and oriented. No focal CN findings. Slow and measured. Able to follow simple commands.   Delayed to respond to questions and to ask questions- significant delay noted Tremor in LUE with movement/reaching- intention tremor Reduced sensation to light touch in the left index and thumb.  Assessment/Plan: 1. Functional deficits which require 3+ hours per day of interdisciplinary therapy in a comprehensive inpatient rehab setting. Physiatrist is providing close team supervision and 24 hour management of active medical problems listed below. Physiatrist and rehab team continue to assess barriers to discharge/monitor patient progress toward functional and medical goals  Care Tool:  Bathing    Body parts bathed by patient: Right arm, Left arm, Chest, Abdomen, Front perineal area, Right upper leg, Left upper leg, Face   Body parts bathed by helper: Left lower leg, Right lower leg, Buttocks     Bathing assist Assist Level: Minimal Assistance - Patient > 75%     Upper Body Dressing/Undressing Upper body dressing   What is the patient wearing?: Pull over shirt    Upper body assist Assist Level: Supervision/Verbal cueing    Lower Body Dressing/Undressing Lower body  dressing      What is the patient wearing?: Pants     Lower body assist Assist for lower body dressing: Supervision/Verbal cueing     Toileting Toileting    Toileting assist Assist for toileting: Contact Guard/Touching assist Assistive Device Comment: urinal   Transfers Chair/bed transfer  Transfers assist     Chair/bed transfer assist level: Supervision/Verbal cueing     Locomotion Ambulation   Ambulation assist      Assist level: Contact Guard/Touching assist Assistive device: Walker-rolling Max distance: 360   Walk 10 feet activity   Assist     Assist level: Supervision/Verbal cueing Assistive device: Walker-rolling   Walk 50 feet activity   Assist    Assist level: Contact Guard/Touching assist Assistive device: Walker-rolling    Walk 150 feet activity   Assist Walk 150 feet activity did not occur: Safety/medical concerns  Assist level: Contact Guard/Touching assist Assistive device: Walker-rolling    Walk 10 feet on uneven surface  activity   Assist Walk 10 feet on uneven surfaces activity did not occur: Safety/medical concerns (could not tolerate assessment due to fatigue)         Wheelchair     Assist Will patient use wheelchair at discharge?: No             Wheelchair 50 feet with 2 turns activity    Assist            Wheelchair 150 feet activity     Assist          Blood pressure 121/60, pulse 62, temperature 98.5 F (36.9 C), temperature source Oral, resp. rate 18, height 6\' 4"  (1.93 m), weight 113.2 kg, SpO2 95 %.  Medical Problem List and Plan: 1.  Anoxic brain injury secondary to cardiac arrest with CPR >90 minutes! S/P pressors with LUE/LLE wounds             -patient may not shower due to life vest-              -ELOS/Goals: 7/8 tentative discharge  supervision to mod I- needs PT, OT and SLP- pt doesn't have insight into memory/cognitive issues.  Conttinue CIR- PT, OT and SLP Grounds  pass prn  2.  Antithrombotics: -DVT/anticoagulation:  Pharmaceutical: Other (comment) --Eliquis.             -antiplatelet therapy: ASA 3. Chest wall pain: Continue Oxycodone prn for chest pain- hx of taking oxy at home chronically, but stopped prior to admission due to constipation.   6/28- loves Gabapentin 300 mg QHS for nerve pain- con't regimen  7/1- Chest pain is NON CARDIAC_ reproduces  with palpation- con't pain regimen 4. Mood: LCSW to follow for evaluation and support.              -antipsychotic agents: N/A 5. Neuropsych: This patient is? capable of making decisions on his own behalf. 6. Skin/Wound Care: local care to denuded blisters.             --continue Vitamin C. Will add protein supplements to promote wound healing.   7/2 reviewed wounds with pt. Discussed with him that there are areas in both left-sided wounds with necrotic tissue which would benefit from I&D. Plastics is planning on taking him to OR next week.  7. Fluids/Electrolytes/Nutrition: Monitor I/O. Check lytes in am. 8. CAD/ICM: Treated medically --avoid nephrotoxic meds             --On BIDIL, coreg, ASA, Lipitor.             --monitor for symptoms with increase in activity.  9. Left forearm/left shin ulcers due to blistering/pressors/necrosis: Continue xeroform with dry dressing changes daily.  6/24- assessed this AM- look better than expected- con't wound care- will con't to monitor  7/2- Got bedside debridement by Plastics- will likely go to OR 7/6 for additional debridement-  10. Constipation:On Senna and miralax-->will increase miralax to bid. Senna increased to BID  6/29- added Linzess prn- and con't regimen  6/20- will give Sorbitol 60 cc after therapy today  7/1-has good BM- yesterday- con't regimen 11. Neuropathy: Will start low dose gabapentin at nights and titrate upwards as tolerated.           has left superficial radial neuropathy likely from IV infiltration with swelling and/or vasospasm  6/28-  will con't gabapentin 300 mg qhs-   12. Hyperglycemia:  Hgb A1C- 5.6: decrease CBGs to daily.  13. PAF: Monitor HR tid--continue amiodarone, Coreg and Eliquis 14. VT/VF: LifeVest in place--will need continued education on management.             --Amiodarone 400 mg bid-->100 mg on 06/29  6/24- advised pt not to remove Life Vest- wrote order to do same  7/2 has an extra vest to switch out Messaged cardiology to leave discharge instructions, follow up recs 15. AKI: Resolving. Continue to avoid nephrotoxic drugs and monitor for recovery. Cr down to 1.64 16. COPD: Stable without MDI at this time.             --monitor for symptoms with increase in activity. 17. sCHF- with EF of 25%- continue to monitor daily weights and monitor, as discussed with patient this is cause of his leg swelling   Filed Weights   06/22/21 0500 06/24/21 0455 06/25/21 0500  Weight: 113.8 kg 111.9 kg 113.2 kg   18. Left thumb pain/weakness: likely radial nerve injury due to levophed extravasation 19. Constipation: continue Linzess PRN 20. Hyperkalemia: Kayexalate 26mL on 7/4 repeat BMP potassium back to the normal range   LOS: 13 days A FACE TO FACE EVALUATION WAS PERFORMED  Charlett Blake 06/25/2021, 8:58 AM

## 2021-06-25 NOTE — Plan of Care (Signed)
  Problem: Consults Goal: RH GENERAL PATIENT EDUCATION Description: See Patient Education module for education specifics. Outcome: Progressing Goal: Skin Care Protocol Initiated - if Braden Score 18 or less Description: If consults are not indicated, leave blank or document N/A Outcome: Progressing Goal: Diabetes Guidelines if Diabetic/Glucose > 140 Description: If diabetic or lab glucose is > 140 mg/dl - Initiate Diabetes/Hyperglycemia Guidelines & Document Interventions  Outcome: Progressing   Problem: RH SKIN INTEGRITY Goal: RH STG SKIN FREE OF INFECTION/BREAKDOWN Description: Skin to remain free from additional breakdown while on CIR with supervision assistance. Outcome: Progressing Goal: RH STG ABLE TO PERFORM INCISION/WOUND CARE W/ASSISTANCE Description: STG Able To Perform Incision/Wound Care With  Supervision Assistance. Outcome: Progressing   Problem: RH SAFETY Goal: RH STG ADHERE TO SAFETY PRECAUTIONS W/ASSISTANCE/DEVICE Description: STG Adhere to Safety Precautions With  Supervision Assistance/Device. Outcome: Progressing Goal: RH STG DECREASED RISK OF FALL WITH ASSISTANCE Description: STG Decreased Risk of Fall With cues and reminders. Outcome: Progressing   Problem: RH PAIN MANAGEMENT Goal: RH STG PAIN MANAGED AT OR BELOW PT'S PAIN GOAL Description: < 3 on a 0-10 pain scale. Outcome: Progressing   Problem: RH KNOWLEDGE DEFICIT GENERAL Goal: RH STG INCREASE KNOWLEDGE OF SELF CARE AFTER HOSPITALIZATION Description: Patient will demonstrate knowledge of medication management, dietary management, skin/wound care with educational materials and handouts provided by staff independently at discharge. Outcome: Progressing

## 2021-06-26 ENCOUNTER — Other Ambulatory Visit (HOSPITAL_COMMUNITY): Payer: Self-pay

## 2021-06-26 LAB — BASIC METABOLIC PANEL
Anion gap: 3 — ABNORMAL LOW (ref 5–15)
BUN: 34 mg/dL — ABNORMAL HIGH (ref 8–23)
CO2: 26 mmol/L (ref 22–32)
Calcium: 11.3 mg/dL — ABNORMAL HIGH (ref 8.9–10.3)
Chloride: 109 mmol/L (ref 98–111)
Creatinine, Ser: 1.81 mg/dL — ABNORMAL HIGH (ref 0.61–1.24)
GFR, Estimated: 40 mL/min — ABNORMAL LOW (ref >60)
Glucose, Bld: 101 mg/dL — ABNORMAL HIGH (ref 70–99)
Potassium: 5 mmol/L (ref 3.5–5.1)
Sodium: 138 mmol/L (ref 135–145)

## 2021-06-26 LAB — GLUCOSE, CAPILLARY: Glucose-Capillary: 95 mg/dL (ref 70–99)

## 2021-06-26 MED ORDER — ALPRAZOLAM 0.25 MG PO TABS: 0.2500 mg | ORAL_TABLET | Freq: Three times a day (TID) | ORAL | 0 refills | Status: DC | PRN

## 2021-06-26 MED ORDER — SENNOSIDES-DOCUSATE SODIUM 8.6-50 MG PO TABS
2.0000 | ORAL_TABLET | Freq: Every day | ORAL | 0 refills | Status: DC
  Filled 2021-06-26: qty 60, 30d supply, fill #0

## 2021-06-26 MED ORDER — CARVEDILOL 6.25 MG PO TABS
6.2500 mg | ORAL_TABLET | Freq: Two times a day (BID) | ORAL | 0 refills | Status: DC
  Filled 2021-06-26: qty 60, 30d supply, fill #0

## 2021-06-26 MED ORDER — MELATONIN 3 MG PO TABS: 3.0000 mg | ORAL_TABLET | Freq: Every evening | ORAL | 0 refills | Status: DC | PRN

## 2021-06-26 MED ORDER — GABAPENTIN 300 MG PO CAPS: 300.0000 mg | ORAL_CAPSULE | Freq: Every day | ORAL | 0 refills | Status: DC

## 2021-06-26 MED ORDER — GABAPENTIN 300 MG PO CAPS
300.0000 mg | ORAL_CAPSULE | Freq: Every day | ORAL | 0 refills | Status: DC
  Filled 2021-06-26: qty 30, 30d supply, fill #0

## 2021-06-26 MED ORDER — SENNOSIDES-DOCUSATE SODIUM 8.6-50 MG PO TABS: 2.0000 | ORAL_TABLET | Freq: Every day | ORAL | 0 refills | Status: DC

## 2021-06-26 MED ORDER — ALPRAZOLAM 0.25 MG PO TABS
0.2500 mg | ORAL_TABLET | Freq: Three times a day (TID) | ORAL | 0 refills | Status: DC | PRN
  Filled 2021-06-26: qty 45, 15d supply, fill #0

## 2021-06-26 MED ORDER — POLYETHYLENE GLYCOL 3350 17 G PO PACK: 17.0000 g | PACK | Freq: Two times a day (BID) | ORAL | 0 refills | Status: DC

## 2021-06-26 MED ORDER — ISOSORB DINITRATE-HYDRALAZINE 20-37.5 MG PO TABS: 1.5000 | ORAL_TABLET | Freq: Three times a day (TID) | ORAL | 0 refills | Status: DC

## 2021-06-26 MED ORDER — ATORVASTATIN CALCIUM 80 MG PO TABS: 80.0000 mg | ORAL_TABLET | Freq: Every day | ORAL | 0 refills | Status: DC

## 2021-06-26 MED ORDER — AMIODARONE HCL 200 MG PO TABS
200.0000 mg | ORAL_TABLET | Freq: Every day | ORAL | 0 refills | Status: DC
  Filled 2021-06-26: qty 30, 30d supply, fill #0

## 2021-06-26 MED ORDER — AMIODARONE HCL 200 MG PO TABS: 200.0000 mg | ORAL_TABLET | Freq: Every day | ORAL | 0 refills | Status: DC

## 2021-06-26 MED ORDER — ISOSORB DINITRATE-HYDRALAZINE 20-37.5 MG PO TABS
1.5000 | ORAL_TABLET | Freq: Three times a day (TID) | ORAL | 0 refills | Status: DC
  Filled 2021-06-26: qty 140, 30d supply, fill #0

## 2021-06-26 MED ORDER — WHITE PETROLATUM EX OINT
TOPICAL_OINTMENT | CUTANEOUS | Status: AC
  Filled 2021-06-26: qty 28.35

## 2021-06-26 MED ORDER — CARVEDILOL 6.25 MG PO TABS: 6.2500 mg | ORAL_TABLET | Freq: Two times a day (BID) | ORAL | 0 refills | Status: DC

## 2021-06-26 MED ORDER — PANTOPRAZOLE SODIUM 40 MG PO TBEC
40.0000 mg | DELAYED_RELEASE_TABLET | Freq: Every day | ORAL | 0 refills | Status: DC
  Filled 2021-06-26: qty 30, 30d supply, fill #0

## 2021-06-26 MED ORDER — POLYETHYLENE GLYCOL 3350 17 GM/SCOOP PO POWD
17.0000 g | Freq: Two times a day (BID) | ORAL | 0 refills | Status: DC
  Filled 2021-06-26: qty 510, 15d supply, fill #0

## 2021-06-26 MED ORDER — OXYCODONE-ACETAMINOPHEN 5-325 MG PO TABS
1.0000 | ORAL_TABLET | Freq: Four times a day (QID) | ORAL | Status: DC | PRN
  Administered 2021-06-26 – 2021-06-27 (×2): 2 via ORAL
  Filled 2021-06-26 (×2): qty 2

## 2021-06-26 MED ORDER — PANTOPRAZOLE SODIUM 40 MG PO TBEC: 40.0000 mg | DELAYED_RELEASE_TABLET | Freq: Every day | ORAL | 0 refills | Status: DC

## 2021-06-26 NOTE — Progress Notes (Signed)
Patient ID: Ross [name], male   DOB: 1956-01-21, 65 y.o.   MRN: [id number] Met with pt and discussed yet again if he wants someone with him he would need to private pay for an aide, like what his girlfriend does. Girlfriend and daughter aware of this. Pt had difficulty doing steps prior to admission so wonderful he can do 12 steps-will need to stay downstairs until girlfriend comes home at 3:30 pm. Bedroom and full bath are upstairs, there is a half bath downstairs. See in am to answer last minute questions

## 2021-06-26 NOTE — Progress Notes (Signed)
Speech Language Pathology Discharge Summary  Patient Details  Name: ADI DORO MRN: [id number] Date of Birth: [birth year]  Today's Date: 06/26/2021 SLP Individual Time: 0800-0830 SLP Individual Time Calculation (min): 30 min   Skilled Therapeutic Interventions:  Patient seen for skilled ST session with focus on addressing cognitive goals and provide final education to patient in anticipation of discharge tomorrow. Patient verbalized understanding of handout regarding Dys 3 solids diet after SLP explanation. He is able to demonstrate adequate reasoning and anticipatory awareness during SLP guided discussion but requires reassurance frequently.     Patient has met 5 of 5 long term goals.  Patient to discharge at overall Supervision level.  Reasons goals not met: N/A   Clinical Impression/Discharge Summary: Patient was able to progress and achieve all 5 LTG's related to cognition and swallow function. At time of discharge, he is at modified independent level for Dys 3 solids, thin liquids and without overt s/s of aspiration or penetration. Patient continues to exhibit decreased recall of recent events, self-reports of being "foggy headed". He did continue with some self-limiting behaviors however unsure what his baseline personality was.(h/o depression and anxiety in chart). Patient is not exhibiting any unsafe behaviors, he is not impulsive, he demonstrates good reasoning and awareness. SLP is recommending patient have 24 supervision at home for safety and assistance with memory deficits.  Care Partner:  Caregiver Able to Provide Assistance: Yes  Type of Caregiver Assistance: Physical;Cognitive  Recommendation:  None      Equipment: none for ST   Reasons for discharge: Treatment goals met;Discharged from hospital   Patient/Family Agrees with Progress Made and Goals Achieved: Yes    Sonia Baller, MA, CCC-SLP Speech Therapy

## 2021-06-26 NOTE — Progress Notes (Signed)
Inpatient Rehabilitation Care Coordinator Discharge Note  The overall goal for the admission was met for:   Discharge location: Yes-HOME WITH MARIANNA-GIRLFRIEND WHO IS THERE IN THE EVENINGS. DAUGHTER TO COME FROM [facility] TO BE WITH SHORT TIME  Length of Stay: Yes-15 DAYS  Discharge activity level: Yes-SUPERVISION LEVEL  Home/community participation: Yes  Services provided included: MD, RD, PT, OT, SLP, RN, CM, Pharmacy, and SW  Financial Services: Private Insurance: [facility]  Choices offered to/list presented to: PT AND GIRLFRIEND  Follow-up services arranged: Home Health: [facility]-PT,OT,SP,RN , DME: [facility] & 3 IN 1  TO GET TUB BENCH ON OWN, and Patient/Family has no preference for HH/DME agencies  Comments (or additional information):GIRLFRIEND IS AN CNA AND DOES PRIVATE DUTY FOR A LIVING AWARE OF PT'S ISSUES. DAUGHTER TO STAY WITH SHORT TIME. AWARE OF TEAM'S RECOMMENDATIONS OF 24/7 SUPERVISION FOR SAFETY  Patient/Family verbalized understanding of follow-up arrangements: Yes  Individual responsible for coordination of the follow-up plan: MARIANNA-GIRLFRIEND [phone]  Confirmed correct DME delivered: Elease Hashimoto 06/26/2021    Jacquez Sheetz, [place] Rhyme

## 2021-06-26 NOTE — Progress Notes (Signed)
Physical Therapy Session Note  Patient Details  Name: Ross [name] MRN: [id number] Date of Birth: 05-Apr-1956  Today's Date: 06/26/2021 PT Individual Time: 0930-1030 PT Individual Time Calculation (min): 60 min   Short Term Goals: Week 2:  PT Short Term Goal 1 (Week 2): Pt will ascend/ descend 8 stairs with 1 handrail and supervision PT Short Term Goal 2 (Week 2): Pt will walk x300 ft with no AD and supervision PT Short Term Goal 3 (Week 2): Pt will navigate community environment with LRAD and supervision.  Skilled Therapeutic Interventions/Progress Updates: Pt presented in recliner agreeable to therapy. Pt denies pain but states still feel "wobbly" and expressed frustration as feeling has had a setback. Provided encouragement and expressed that pt did not have best day yesterday but with practice may be able to improve today. Pt performed Sit to stand from recliner with supervision and requested RW. Pt ambulated with supervision to elevators with w/c follow and sat in w/c to lower level. Pt then propelled with BLE to rehab gym for BLE strengthening and endurance. In rehab gym performed Sit to stand transfer to mat with CGA and no AD. Participated in toe taps to target initially with HHA then progressing to CGA only for dynamic balance and weight shifting. Pt then agreeable to ambulate without AD initially CGA progressing to close supervision. Pt also participated in ascending/descending x 12 steps with B rails with supervision. After seated rest pt ambulated to ortho gym ~150 with CGA nearing supervision and performed car transfer from 27in seat height with supervision. After seated rest pt perseverated on desire to stay but indicated he has improved and feels more confident in ambulation and stairs from beginning of session. Pt ambulated to elevators ~140f with CGA fading to close supervision and transferred to w/c for elevator transport. Pt ambulated back to room no AD and CGA and returned to  recliner. Pt left in recliner at end of session with seat alarm on, call bell within reach and needs met.       Therapy Documentation Precautions:  Precautions Precautions: Fall Precaution Comments: Lifevest Restrictions Weight Bearing Restrictions: No General:   Vital Signs:   Pain: Pain Assessment Pain Scale: 0-10 Pain Score: 3  Pain Type: Acute pain Pain Location: Leg Pain Orientation: Left Pain Descriptors / Indicators: Aching Pain Onset: On-going Pain Intervention(s): Medication (See eMAR)    Therapy/Group: Individual Therapy  Deshonda Cryderman Adali Pennings, PTA  06/26/2021, 11:05 AM

## 2021-06-26 NOTE — Progress Notes (Signed)
Occupational Therapy Discharge Summary  Patient Details  Name: Ross [name] MRN: [id number] Date of Birth: [birth year]    Patient has met 8 of 8 long term goals due to improved activity tolerance, improved balance, postural control, ability to compensate for deficits, improved awareness, and improved coordination.  Patient to discharge at overall Supervision level.  Patient's care partner is independent to provide the necessary physical and cognitive assistance at discharge.  Pt's significant other [name] has completed family training 2 times and is trained to provide Supervision for ADLs and functional transfers/mobility. Pt's daughter coming from [facility] to assist as well PRN. Pt performing ADLs without AD and needs cues for safety and problem solving.   Reasons goals not met: NA  Recommendation:  Patient will benefit from ongoing skilled OT services in home health setting to continue to advance functional skills in the area of BADL, iADL, and Reduce care partner burden.  Equipment: BSC, TTB  Reasons for discharge: treatment goals met and discharge from hospital  Patient/family agrees with progress made and goals achieved: Yes  OT Discharge Precautions/Restrictions  Precautions Precautions: Fall Precaution Comments: Lifevest Restrictions Weight Bearing Restrictions: No Pain Pain Assessment Pain Scale: 0-10 Pain Score: 0-No pain ADL ADL Eating: Not assessed Grooming: Setup Where Assessed-Grooming: Edge of bed Upper Body Bathing: Supervision/safety Where Assessed-Upper Body Bathing: Edge of bed Lower Body Bathing: Supervision/safety Where Assessed-Lower Body Bathing: Sitting at sink, Standing at sink Upper Body Dressing: Setup Where Assessed-Upper Body Dressing: Edge of bed Lower Body Dressing: Supervision/safety Where Assessed-Lower Body Dressing: Edge of bed Toileting: Supervision/safety Where Assessed-Toileting: Bedside Commode Toilet Transfer: Close supervision Toilet  Transfer Method: Ambulating (without AD) Science writer: Bedside commode Tub/Shower Transfer: Close supervison Vision Baseline Vision/History: No visual deficits Vision Assessment?: No apparent visual deficits Perception  Perception: Within Functional Limits Praxis Praxis: Intact Cognition Overall Cognitive Status: Impaired/Different from baseline Arousal/Alertness: Awake/alert Orientation Level: Oriented X4 Attention: Alternating Alternating Attention: Appears intact Memory: Impaired Memory Impairment: Retrieval deficit;Decreased recall of new information Decreased Short Term Memory: Verbal complex Awareness: Appears intact Problem Solving: Appears intact Behaviors: Poor frustration tolerance Safety/Judgment: Appears intact Sensation Sensation Light Touch: Impaired Detail Light Touch Impaired Details: Impaired LLE;Impaired LUE Proprioception: Appears Intact Coordination Gross Motor Movements are Fluid and Coordinated: No Fine Motor Movements are Fluid and Coordinated: No Coordination and Movement Description: Affected by Lt sided pain, hypersensitivity, and swelling, intention tremor in the Lt UE at times but significantly improved Motor  Motor Motor: Other (comment) (grossly uncoordinated at times d/t injuries, mild tremor in LUE at times) Mobility  Transfers Sit to Stand: Supervision/Verbal cueing Stand to Sit: Supervision/Verbal cueing  Trunk/Postural Assessment  Cervical Assessment Cervical Assessment: Exceptions to [facility] Thoracic Assessment Thoracic Assessment: Exceptions to [facility] Lumbar Assessment Lumbar Assessment: Exceptions to [facility] Postural Control Postural Control: Within Functional Limits  Balance Balance Balance Assessed: Yes Dynamic Sitting Balance Dynamic Sitting - Balance Support: During functional activity Dynamic Sitting - Level of Assistance: 5: Stand by assistance Dynamic Sitting - Balance Activities: Lateral lean/weight shifting;Forward  lean/weight shifting;Reaching across midline Static Standing Balance Static Standing - Level of Assistance: 5: Stand by assistance Dynamic Standing Balance Dynamic Standing - Balance Support: Bilateral upper extremity supported;During functional activity Dynamic Standing - Level of Assistance: 5: Stand by assistance Dynamic Standing - Balance Activities: Lateral lean/weight shifting;Forward lean/weight shifting;Reaching for objects Extremity/Trunk Assessment RUE Assessment RUE Assessment: Within Functional Limits LUE Assessment LUE Assessment: Exceptions to [facility] General Strength Comments: unable to MMT d/t wounds/bandages, hypersensitivity present, but functional for  tasks   Viona Gilmore 06/26/2021, 12:27 PM

## 2021-06-26 NOTE — Progress Notes (Signed)
Occupational Therapy Session Note  Patient Details  Name: ORYAN WINTERTON MRN: [id number] Date of Birth: 1956/03/11  Today's Date: 06/26/2021 OT Individual Time: 1115-1200 OT Individual Time Calculation (min): 45 min    Short Term Goals: Week 1:  OT Short Term Goal 1 (Week 1): Pt will complete an ambulatory toilet transfer using LRAD and no more than Min A OT Short Term Goal 1 - Progress (Week 1): Met OT Short Term Goal 2 (Week 1): Pt will complete LB dressing with Min A using adaptive strategies as needed OT Short Term Goal 2 - Progress (Week 1): Met OT Short Term Goal 3 (Week 1): Pt will complete 1 grooming task while standing at the sink for more than 10 sec to increase functional standing endurance OT Short Term Goal 3 - Progress (Week 1): Met Week 2:  OT Short Term Goal 1 (Week 2): STGs = LTGs at Supervision   Skilled Therapeutic Interventions/Progress Updates:    Pt greeted at time of session sitting up in recliner agreeable to OT session and no pain reported throughout session. Pt wanting to do ADL, walked recliner <> sink level no AD, performed UB/LB bathing Supervision overall at sit <> stand level at sink. UB/LB dress Supervision as well for pull over shirt and pants. Assisted with hair washing as well at sink level per pt request, having pt perform towel drying and grooming. Note pt needing cues for safety throughout session trying to doff LB clothing in standing and stand to kick off pants as well as proximity to sink/counter space. Pt up in recliner alarm on call bell in reach and aware of going home tomorrow. Pt very pleasant throughout.   Therapy Documentation Precautions:  Precautions Precautions: Fall Precaution Comments: Lifevest Restrictions Weight Bearing Restrictions: No    Therapy/Group: Individual Therapy  Viona Gilmore 06/26/2021, 7:22 AM

## 2021-06-26 NOTE — Progress Notes (Signed)
Physical Therapy Discharge Summary  Patient Details  Name: Ross [name] MRN: [id number] Date of Birth: 1956-06-20  Today's Date: 06/26/2021 PT Individual Time: 1445-1530 PT Individual Time Calculation (min): 45 min    Patient has met 9 of 9 long term goals due to improved activity tolerance, improved balance, increased strength, improved awareness, and improved coordination.  Patient to discharge at an ambulatory level Supervision.   Patient's care partner is independent to provide the necessary physical assistance at discharge. Pt performing gait and transfers with supervision, stairs with supervision per home environment. Pt's family completed family education on how to provide supervision and assist when needed.   Reasons goals not met: N/A  Recommendation:  Patient will benefit from ongoing skilled PT services in home health setting to continue to advance safe functional mobility, address ongoing impairments in balance, endurance, LE strength, and minimize fall risk.  Equipment: Rolling walker  Reasons for discharge: treatment goals met and discharge from hospital  Patient/family agrees with progress made and goals achieved: Yes   Skilled Therapy Interventions Pt seated in recliner, agreeable to therapy. Pt donned shoes with assist for time management. Pt performed Sit to stand with supervision throughout session. Gait x 60 ft without AD with supervision. Pt demoed poor foot clearance and occasional furniture walking, without LOB. Pt navigated 18 steps with Bil handrails and reciprocal gait pattern. Pt stated second handrail will be installed at home before he arrives. Pt transported to gym for time. Navigated ramp, uneven surface, and curb step with supervision, CGA at times for pt comfort. Pt then trialled picking up items from low surface and ground, required CGA to pick up cone from ground but able to pick up small items from knee high surface with supervision, would be mod I with  reacher but none was found in gym. Discussed reasoning for supervision assist levels, and strategies for safety at home, such as using a urinal at night. Pt propelled w/c x300 ft back to room and performed ambulatory transfer back to recliner. Pt left in room with chair alarm active and all needs in reach.   PT Discharge Precautions/Restrictions Precautions Precautions: Fall Precaution Comments: Lifevest Restrictions Weight Bearing Restrictions: No Vital Signs   Pain Pain Assessment Pain Score: 0-No pain Vision/Perception  Perception Perception: Within Functional Limits Praxis Praxis: Intact  Cognition Overall Cognitive Status: Impaired/Different from baseline Arousal/Alertness: Awake/alert Orientation Level: Oriented X4 Attention: Alternating Alternating Attention: Appears intact Memory: Impaired Memory Impairment: Retrieval deficit;Decreased recall of new information Awareness: Appears intact Problem Solving: Appears intact Behaviors: Poor frustration tolerance Safety/Judgment: Appears intact Sensation Sensation Light Touch: Appears Intact Proprioception: Appears Intact Coordination Gross Motor Movements are Fluid and Coordinated: Yes Coordination and Movement Description: Affected by Lt sided pain, hypersensitivity, and swelling, intention tremor in the Lt UE at times but significantly improved Motor  Motor Motor: Other (comment) Motor - Discharge Observations: Grossly uncoordinated at times but greatly improved from basline  Mobility Bed Mobility Bed Mobility: Rolling Right;Supine to Sit;Rolling Left;Sit to Supine Rolling Right: Supervision/verbal cueing Rolling Left: Supervision/Verbal cueing Supine to Sit: Supervision/Verbal cueing Sit to Supine: Supervision/Verbal cueing Transfers Transfers: Sit to Stand;Stand to Sit;Stand Pivot Transfers Sit to Stand: Supervision/Verbal cueing Stand to Sit: Supervision/Verbal cueing Stand Pivot Transfers:  Supervision/Verbal cueing Stand Pivot Transfer Details: Verbal cues for technique;Verbal cues for precautions/safety;Verbal cues for sequencing Transfer (Assistive device): None Locomotion  Gait Ambulation: Yes Gait Assistance: Supervision/Verbal cueing Gait Distance (Feet): 250 Feet Assistive device: None Gait Gait: Yes Gait Pattern: Impaired Gait Pattern:  Poor foot clearance - left;Poor foot clearance - right Gait velocity: reduced Stairs / Additional Locomotion Stairs: Yes Stairs Assistance: Supervision/Verbal cueing Stair Management Technique: Two rails Number of Stairs: 18 Height of Stairs: 7 Ramp: Supervision/Verbal cueing Curb: Supervision/Verbal cueing  Trunk/Postural Assessment  Cervical Assessment Cervical Assessment: Within Functional Limits Thoracic Assessment Thoracic Assessment: Within Functional Limits Lumbar Assessment Lumbar Assessment: Within Functional Limits Postural Control Postural Control: Within Functional Limits  Balance Balance Balance Assessed: Yes Dynamic Sitting Balance Dynamic Sitting - Balance Support: During functional activity Dynamic Sitting - Level of Assistance: 5: Stand by assistance Static Standing Balance Static Standing - Balance Support: During functional activity Static Standing - Level of Assistance: 5: Stand by assistance Dynamic Standing Balance Dynamic Standing - Balance Support: During functional activity Dynamic Standing - Level of Assistance: 5: Stand by assistance Extremity Assessment      RLE Assessment RLE Assessment: Within Functional Limits LLE Assessment LLE Assessment: Exceptions to [facility] General Strength Comments: Limited by pain, improved from baseline as demonstrated by functional mobility    Traniece Boffa C Geralyn Figiel 06/26/2021, 7:51 PM

## 2021-06-26 NOTE — Progress Notes (Signed)
PROGRESS NOTE   Subjective/Complaints: Pt decline I and D per plastics, they plan to see him in office in 1-2 wk post d/c  Made it up and down 12steps yesterday with PT< (has 14 steps at home) Pt asking if anyone can stay with him at home from 7a to 330P during week days  ROS: Patient denies shortness of breath abdominal pains, positive swelling in feet, positive chest pain   Objective:   No results found. No results for input(s): WBC, HGB, HCT, PLT in the last 72 hours.   Recent Labs    06/24/21 0705 06/26/21 0606  NA 136 138  K 5.0 5.0  CL 107 109  CO2 25 26  GLUCOSE 100* 101*  BUN 37* 34*  CREATININE 1.64* 1.81*  CALCIUM 11.8* 11.3*      Intake/Output Summary (Last 24 hours) at 06/26/2021 0758 Last data filed at 06/26/2021 6160 Gross per 24 hour  Intake 592 ml  Output --  Net 592 ml       Pressure Injury 06/12/21 Back Mid;Left Stage 2 -  Partial thickness loss of dermis presenting as a shallow open injury with a red, pink wound bed without slough. Left side mid back, stage 2, layer of skin missing, covered with foam dressing, area pink. (Active)  06/12/21 1600  Location: Back  Location Orientation: Mid;Left  Staging: Stage 2 -  Partial thickness loss of dermis presenting as a shallow open injury with a red, pink wound bed without slough.  Wound Description (Comments): Left side mid back, stage 2, layer of skin missing, covered with foam dressing, area pink.  Present on Admission: Yes    Physical Exam: Vital Signs Blood pressure 125/65, pulse 66, temperature 98.1 F (36.7 C), resp. rate 18, height 6\' 4"  (1.93 m), weight 113.3 kg, SpO2 92 %.  General: No acute distress Mood and affect are appropriate Heart: Regular rate and rhythm no rubs murmurs or extra sounds Lungs: Clear to auscultation, breathing unlabored, no rales or wheezes Abdomen: Positive bowel sounds, soft nontender to palpation,  nondistended Extremities: No clubbing, cyanosis, or edema Skin: No evidence of breakdown, no evidence of rash  Musculoskeletal:        General: trace LE present.    Cervical back: Normal range of motion and neck supple.   RUE- 5/5- in all muscles checked from proximal to distal LUE_ at least 3+/5- cannot tolerate any pressure on LUE to test strength further 5/5 RULE, 4/5 LLE   Skin:    Comments: 1+LLE edema Dressing on LUE/LLE C/D/I   Missing 2 front teeth.  Has partial plate Neurological:.    alert and oriented. No focal CN findings. Slow and measured. Able to follow simple commands.   Delayed to respond to questions and to ask questions- significant delay noted  Reduced sensation to light touch in the left index and thumb.        Assessment/Plan: 1. Functional deficits which require 3+ hours per day of interdisciplinary therapy in a comprehensive inpatient rehab setting. Physiatrist is providing close team supervision and 24 hour management of active medical problems listed below. Physiatrist and rehab team continue to assess barriers to discharge/monitor patient progress  toward functional and medical goals  Care Tool:  Bathing    Body parts bathed by patient: Right arm, Left arm, Chest, Abdomen, Front perineal area, Right upper leg, Left upper leg, Face   Body parts bathed by helper: Left lower leg, Right lower leg, Buttocks     Bathing assist Assist Level: Minimal Assistance - Patient > 75%     Upper Body Dressing/Undressing Upper body dressing   What is the patient wearing?: Pull over shirt    Upper body assist Assist Level: Supervision/Verbal cueing    Lower Body Dressing/Undressing Lower body dressing      What is the patient wearing?: Pants     Lower body assist Assist for lower body dressing: Supervision/Verbal cueing     Toileting Toileting    Toileting assist Assist for toileting: Contact Guard/Touching assist Assistive Device Comment:  urinal   Transfers Chair/bed transfer  Transfers assist     Chair/bed transfer assist level: Supervision/Verbal cueing     Locomotion Ambulation   Ambulation assist      Assist level: Contact Guard/Touching assist Assistive device: Walker-rolling Max distance: 360   Walk 10 feet activity   Assist     Assist level: Supervision/Verbal cueing Assistive device: Walker-rolling   Walk 50 feet activity   Assist    Assist level: Contact Guard/Touching assist Assistive device: Walker-rolling    Walk 150 feet activity   Assist Walk 150 feet activity did not occur: Safety/medical concerns  Assist level: Contact Guard/Touching assist Assistive device: Walker-rolling    Walk 10 feet on uneven surface  activity   Assist Walk 10 feet on uneven surfaces activity did not occur: Safety/medical concerns (could not tolerate assessment due to fatigue)         Wheelchair     Assist Will patient use wheelchair at discharge?: No             Wheelchair 50 feet with 2 turns activity    Assist            Wheelchair 150 feet activity     Assist          Blood pressure 125/65, pulse 66, temperature 98.1 F (36.7 C), resp. rate 18, height 6\' 4"  (1.93 m), weight 113.3 kg, SpO2 92 %.  Medical Problem List and Plan: 1.  Anoxic brain injury secondary to cardiac arrest with CPR >90 minutes! S/P pressors with LUE/LLE wounds             -patient may not shower due to life vest-              -ELOS/Goals: 7/8 tentative discharge  supervision to mod I- needs PT, OT and SLP- pt doesn't have insight into memory/cognitive issues.  Conttinue CIR- PT, OT and SLP Grounds pass prn  2.  Antithrombotics: -DVT/anticoagulation:  Pharmaceutical: Other (comment) --Eliquis.             -antiplatelet therapy: ASA 3. Chest wall pain: Continue Oxycodone prn for chest pain- hx of taking oxy at home chronically, but stopped prior to admission due to constipation.    6/28- loves Gabapentin 300 mg QHS for nerve pain- con't regimen  7/1- Chest pain is NON CARDIAC_ reproduces with palpation- con't pain regimen 4. Mood: LCSW to follow for evaluation and support.              -antipsychotic agents: N/A 5. Neuropsych: This patient is? capable of making decisions on his own behalf. 6. Skin/Wound Care: local care to denuded  blisters.             --continue Vitamin C. Will add protein supplements to promote wound healing.   7/2 reviewed wounds with pt. Discussed with him that there are areas in both left-sided wounds with necrotic tissue which would benefit from I&D. Plastics is planning on taking him to OR next week.  7. Fluids/Electrolytes/Nutrition: Monitor I/O. Check lytes in am. 8. CAD/ICM which is non functional : Treated medically --avoid nephrotoxic meds             --On BIDIL, coreg, ASA, Lipitor.             --monitor for symptoms with increase in activity.  Has Life vest until his wounds heal and he can undergo ICM replacement - contacted cardiology Dr Gilman Schmidt, cardio PA will come by to discuss f/u prior to discharge  9. Left forearm/left shin ulcers due to blistering/pressors/necrosis: Continue xeroform with dry dressing changes daily.  6/24- assessed this AM- look better than expected- con't wound care- will con't to monitor  7/2- Got bedside debridement by Plastics- will likely go to OR 7/6 for additional debridement-  10. Constipation:On Senna and miralax-->will increase miralax to bid. Senna increased to BID  6/29- added Linzess prn- and con't regimen  6/20- will give Sorbitol 60 cc after therapy today  7/1-has good BM- yesterday- con't regimen 11. Neuropathy: Will start low dose gabapentin at nights and titrate upwards as tolerated.           has left superficial radial neuropathy likely from IV infiltration with swelling and/or vasospasm  6/28- will con't gabapentin 300 mg qhs-   12. Hyperglycemia:  Hgb A1C- 5.6: decrease CBGs to daily.  13.  PAF: Monitor HR tid--continue amiodarone, Coreg and Eliquis 14. VT/VF: LifeVest in place--will need continued education on management.             --Amiodarone 400 mg bid-->100 mg on 06/29  6/24- advised pt not to remove Life Vest- wrote order to do same  7/2 has an extra vest to switch out cardiology to leave discharge instructions, follow up recs 15. AKI: Resolving. Continue to avoid nephrotoxic drugs and monitor for recovery. Cr down to 1.64 16. COPD: Stable without MDI at this time.             --monitor for symptoms with increase in activity. 17. sCHF- with EF of 25%- continue to monitor daily weights and monitor, as discussed with patient this is cause of his leg swelling   Filed Weights   06/24/21 0455 06/25/21 0500 06/26/21 0500  Weight: 111.9 kg 113.2 kg 113.3 kg   18. Left thumb pain/weakness: likely radial nerve injury due to levophed extravasation left forearm 19. Constipation: continue Linzess PRN 20. Hyperkalemia: Kayexalate 59mL on 7/4 repeat BMP potassium back to the normal range- K+ stable at 5.0   LOS: 14 days A FACE TO FACE EVALUATION WAS PERFORMED  Charlett Blake 06/26/2021, 7:58 AM

## 2021-06-27 ENCOUNTER — Other Ambulatory Visit (HOSPITAL_COMMUNITY): Payer: Self-pay

## 2021-06-27 LAB — GLUCOSE, CAPILLARY: Glucose-Capillary: 89 mg/dL (ref 70–99)

## 2021-06-27 MED ORDER — ATORVASTATIN CALCIUM 80 MG PO TABS
80.0000 mg | ORAL_TABLET | Freq: Every day | ORAL | 0 refills | Status: DC
  Filled 2021-06-27: qty 30, 30d supply, fill #0

## 2021-06-27 NOTE — Progress Notes (Signed)
INPATIENT REHABILITATION DISCHARGE NOTE   Discharge instructions by: Reesa Chew  Verbalized understanding: Yes  Skin care/Wound care: Done  Pain: No pain  IV's: No IV  Tubes/Drains: none  Safety instructions: Fall risk  Patient belongings: sent home with patient  Discharged to: home  Discharged via: private car  Notes:

## 2021-06-27 NOTE — Progress Notes (Signed)
PROGRESS NOTE   Subjective/Complaints: Ross [name] is worried about his ability to navigate stairs at home. He is worried about his limited family support once his daughter leaves. Discussed these issues with him  ROS: Patient denies shortness of breath abdominal pains, positive swelling in feet, positive chest pain   Objective:   No results found. No results for input(s): WBC, HGB, HCT, PLT in the last 72 hours.   Recent Labs    06/26/21 0606  NA 138  K 5.0  CL 109  CO2 26  GLUCOSE 101*  BUN 34*  CREATININE 1.81*  CALCIUM 11.3*     Intake/Output Summary (Last 24 hours) at 06/27/2021 0849 Last data filed at 06/27/2021 0700 Gross per 24 hour  Intake 976 ml  Output --  Net 976 ml      Pressure Injury 06/12/21 Back Mid;Left Stage 2 -  Partial thickness loss of dermis presenting as a shallow open injury with a red, pink wound bed without slough. Left side mid back, stage 2, layer of skin missing, covered with foam dressing, area pink. (Active)  06/12/21 1600  Location: Back  Location Orientation: Mid;Left  Staging: Stage 2 -  Partial thickness loss of dermis presenting as a shallow open injury with a red, pink wound bed without slough.  Wound Description (Comments): Left side mid back, stage 2, layer of skin missing, covered with foam dressing, area pink.  Present on Admission: Yes    Physical Exam: Vital Signs Blood pressure 137/77, pulse 89, temperature 98.6 F (37 C), resp. rate 18, height 6\' 4"  (1.93 m), weight 110.1 kg, SpO2 95 %. Gen: no distress, normal appearing HEENT: oral mucosa pink and moist, NCAT Cardio: Reg rate Chest: normal effort, normal rate of breathing Abd: soft, non-distended Extremities: No clubbing, cyanosis, or edema Skin: No evidence of breakdown, no evidence of rash  Musculoskeletal:        General: trace LE present.    Cervical back: Normal range of motion and neck supple.   RUE-  5/5- in all muscles checked from proximal to distal LUE_ at least 3+/5- cannot tolerate any pressure on LUE to test strength further 5/5 RULE, 4/5 LLE   Skin:    Comments: 1+LLE edema Dressing on LUE/LLE C/D/I   Missing 2 front teeth.  Has partial plate Neurological:.    alert and oriented. No focal CN findings. Slow and measured. Able to follow simple commands.   Delayed to respond to questions and to ask questions- significant delay noted  Reduced sensation to light touch in the left index and thumb.        Assessment/Plan: 1. Functional deficits which require 3+ hours per day of interdisciplinary therapy in a comprehensive inpatient rehab setting. Physiatrist is providing close team supervision and 24 hour management of active medical problems listed below. Physiatrist and rehab team continue to assess barriers to discharge/monitor patient progress toward functional and medical goals  Care Tool:  Bathing    Body parts bathed by patient: Right arm, Left arm, Chest, Abdomen, Front perineal area, Right upper leg, Left upper leg, Face, Buttocks, Right lower leg, Left lower leg   Body parts bathed by helper: Left  lower leg, Right lower leg, Buttocks     Bathing assist Assist Level: Supervision/Verbal cueing     Upper Body Dressing/Undressing Upper body dressing   What is the patient wearing?: Pull over shirt    Upper body assist Assist Level: Set up assist    Lower Body Dressing/Undressing Lower body dressing      What is the patient wearing?: Pants     Lower body assist Assist for lower body dressing: Supervision/Verbal cueing     Toileting Toileting    Toileting assist Assist for toileting: Supervision/Verbal cueing Assistive Device Comment: urinal   Transfers Chair/bed transfer  Transfers assist     Chair/bed transfer assist level: Supervision/Verbal cueing     Locomotion Ambulation   Ambulation assist      Assist level: Supervision/Verbal  cueing Assistive device: No Device Max distance: 250   Walk 10 feet activity   Assist     Assist level: Supervision/Verbal cueing Assistive device: No Device   Walk 50 feet activity   Assist    Assist level: Supervision/Verbal cueing Assistive device: No Device    Walk 150 feet activity   Assist Walk 150 feet activity did not occur: Safety/medical concerns  Assist level: Supervision/Verbal cueing Assistive device: No Device    Walk 10 feet on uneven surface  activity   Assist Walk 10 feet on uneven surfaces activity did not occur: Safety/medical concerns (could not tolerate assessment due to fatigue)   Assist level: Contact Guard/Touching assist     Wheelchair     Assist Will patient use wheelchair at discharge?: No             Wheelchair 50 feet with 2 turns activity    Assist            Wheelchair 150 feet activity     Assist          Blood pressure 137/77, pulse 89, temperature 98.6 F (37 C), resp. rate 18, height 6\' 4"  (1.93 m), weight 110.1 kg, SpO2 95 %.  Medical Problem List and Plan: 1.  Anoxic brain injury secondary to cardiac arrest with CPR >90 minutes! S/P pressors with LUE/LLE wounds             -patient may not shower due to life vest-              -ELOS/Goals: 7/8 tentative discharge  supervision to mod I- needs PT, OT and SLP- pt doesn't have insight into memory/cognitive issues.  Continue CIR- PT, OT and SLP Grounds pass prn  2.  Antithrombotics: -DVT/anticoagulation:  Pharmaceutical: Other (comment) --Eliquis.             -antiplatelet therapy: ASA 3. Chest wall pain: Continue Oxycodone prn for chest pain- hx of taking oxy at home chronically, but stopped prior to admission due to constipation.   6/28- loves Gabapentin 300 mg QHS for nerve pain- con't regimen  7/1- Chest pain is NON CARDIAC_ reproduces with palpation- con't pain regimen 4. Mood: LCSW to follow for evaluation and support.               -antipsychotic agents: N/A 5. Neuropsych: This patient is? capable of making decisions on his own behalf. 6. Skin/Wound Care: local care to denuded blisters.             --continue Vitamin C. Will add protein supplements to promote wound healing.   7/2 reviewed wounds with pt. Discussed with him that there are areas in both left-sided wounds  with necrotic tissue which would benefit from I&D- patient refused 7. Fluids/Electrolytes/Nutrition: Monitor I/O. Check lytes in am. 8. CAD/ICM which is non functional : Treated medically --avoid nephrotoxic meds             --Continue BIDIL, coreg, ASA, Lipitor.             --monitor for symptoms with increase in activity.  Has Life vest until his wounds heal and he can undergo ICM replacement - contacted cardiology Dr Gilman Schmidt, cardio PA will come by to discuss f/u prior to discharge  9. Left forearm/left shin ulcers due to blistering/pressors/necrosis: Continue xeroform with dry dressing changes daily.  6/24- assessed this AM- look better than expected- con't wound care- will con't to monitor  7/2- Got bedside debridement by Plastics- will likely go to OR 7/6 for additional debridement-  10. Constipation:On Senna and miralax-->will increase miralax to bid. Senna increased to BID  6/29- added Linzess prn- and con't regimen  6/20- will give Sorbitol 60 cc after therapy today  7/8: continue regimen 11. Neuropathy: Will start low dose gabapentin at nights and titrate upwards as tolerated.           has left superficial radial neuropathy likely from IV infiltration with swelling and/or vasospasm  6/28- will con't gabapentin 300 mg qhs-   12. Hyperglycemia:  Hgb A1C- 5.6: decrease CBGs to daily.  13. PAF: Monitor HR tid--continue amiodarone, Coreg and Eliquis 14. VT/VF: LifeVest in place--will need continued education on management.             --Amiodarone 400 mg bid-->100 mg on 06/29  6/24- advised pt not to remove Life Vest- wrote order to do same  7/2  has an extra vest to switch out cardiology to leave discharge instructions, follow up recs 15. AKI: Resolving. Continue to avoid nephrotoxic drugs and monitor for recovery. Cr down to 1.64 16. COPD: Stable without MDI at this time.             --monitor for symptoms with increase in activity. 17. sCHF- with EF of 25%- continue to monitor daily weights and monitor, as discussed with patient this is cause of his leg swelling   Filed Weights   06/25/21 0500 06/26/21 0500 06/27/21 0700  Weight: 113.2 kg 113.3 kg 110.1 kg   18. Left thumb pain/weakness: likely radial nerve injury due to levophed extravasation left forearm 19. Constipation: continue Linzess PRN 20. Hyperkalemia: Kayexalate 36mL on 7/4 repeat BMP potassium back to the normal range- K+ stable at 5.0   >30 minutes spent in discharge of patient including review of medications and follow-up appointments, physical examination, and in answering all patient's questions    LOS: 15 days A FACE TO FACE EVALUATION WAS PERFORMED  Clide Deutscher Roberto Romanoski 06/27/2021, 8:49 AM

## 2021-06-27 NOTE — Discharge Summary (Signed)
Physician Discharge Summary  Patient ID: Ross [name] MRN: [id number] DOB/AGE: August 06, 1956 65 y.o.  Admit date: 06/12/2021 Discharge date: 06/27/2021  Discharge Diagnoses:  Principal Problem:   Anoxic brain injury [facility]) Active Problems:   Chest pain of uncertain etiology   Persistent atrial fibrillation (HCC)   Cardiac arrest ([place])   Pressure injury of skin   Skin necrosis (HCC)   Discharged Condition: good  Significant Diagnostic Studies:   Labs:  Basic Metabolic Panel: Recent Labs  Lab 06/23/21 0512 06/24/21 0705 06/26/21 0606  NA 137 136 138  K 5.4* 5.0 5.0  CL 109 107 109  CO2 _0 GLUCOSE 95 100* 101*  BUN 42* 37* 34*  CREATININE 1.86* 1.64* 1.81*  CALCIUM 11.9* 11.8* 11.3*    CBC: CBC Latest Ref Rng & Units 06/23/2021 06/13/2021 06/07/2021  WBC 4.0 - 10.5 K/uL 8.1 11.6(H) 10.1  Hemoglobin 13.0 - 17.0 g/dL 10.5(L) 10.5(L) 11.9(L)  Hematocrit 39.0 - 52.0 % 33.2(L) 31.7(L) 35.9(L)  Platelets 150 - 400 K/uL 353 432(H) 203     CBG: Recent Labs  Lab 06/23/21 0610 06/24/21 0605 06/25/21 0607 06/26/21 0557 06/27/21 0618  GLUCAP 90 87 99 95 89    Brief HPI:   Ross [name] is a 65 y.o. male with history of CAD, COPD, A fib, peripheral neuropathy, CHF/ICM with EF 25% who was admitted on 05/31/2021 after cardiac arrest with greater than 1 hour downtime.  He required prolonged CPR for VT/VF arrest and was emergently taken to Cath Lab and found to have severe proximal LAD stenosis which was treated medically.  He was started on IV amiodarone as well as IV heparin and required Levophed due to hypotension.  Initial plans for ICD discontinued due to development of left forearm and shin wounds from an extravasation of Levophed.    Plastics was consulted for input and recommended local care with Xeroform and dry dressing.  EP recommended transitioning patient to p.o. amiodarone 400 twice daily x2 weeks and then tapered to 200 mg daily.  ICD placement placed on hold  pending resolution of wounds and LifeVest was ordered for use.  He has also had issues with AKI felt to be due to ATN/contrast, hematuria, PAF, orthostasis as well as higher level cognitive deficits. Therapy was ongoing and he continued to have issues with weakness, fatigue and anoxic brain injury.  CIR was recommended due to anoxic brain injury   Hospital Course: Ross [name] was admitted to rehab 06/12/2021 for inpatient therapies to consist of PT, ST and OT at least three hours five days a week. Past admission physiatrist, therapy team and rehab RN have worked together to provide customized collaborative inpatient rehab.  His blood pressures were monitored on TID basis and has been relatively stable.  He reported constant chest wall pain since his CABG which was exacerbated by recent CPR.  He was started on low-dose gabapentin which was titrated to 300 mg nightly without any side effects.  He has had issues with noncardiac chest pain and has not reported any shortness of breath or new chest pain with increase in activity.  Patient and girlfriend were reeducated on use of LifeVest and he was advised to wear this at all times.  His blood sugars were monitored on achs for 24 hours but as well controlled this was decreased to once a day.    Bowel program has been augmented to help manage acute on chronic constipation.  Linzess was scheduled daily in addition  to senna and MiraLAX.  Protein supplements were added to help promote wound healing.  Plastics has followed up with patient as well monitoring/evaluation of wound and wounds were debrided at bed on 07/02.  He was scheduled for formal I & D by Dr. Marla Roe.  However patient declined this due to fear of anesthesia.  Patient and family have been instructed on daily wound care and home health RN arrange to follow for monitoring after discharge.  Labs monitored with serial checks.  On 07/04, potassium was noted to be elevated at 5.4 and he was treated with a  dose of Kayexalate.    Follow up BMET showed that AKI is resolving and pharmacy was consulted to evaluate meds. No offending drugs noted and follow-up labs showed potassium level stable at 5.0 therefore was educated on low potassium restrictions by RD.  Recommend repeat follow-up be made in 5 to 7 days to monitor for stability.  Respiratory status has been stable.  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  This has been treated with reated with ice and lidocaine jelly.  Medication changes were reviewed extensively with patient.  Family education was completed regarding all aspects of safety and care.  Girlfriend and daughter were instructed on need for 24 hours supervision for safety as well as cognitive assistance.  Rehab course: During patient's stay in rehab weekly team conferences were held to monitor patient's progress, set goals and discuss barriers to discharge. At admission, patient required min to mod assist with ADL tasks and mod assist with mobility.  He exhibited mild oral dysphagia as well as mild cognitive impairment with improvement in SLUMS score to 29/30. He  has had improvement in activity tolerance, balance, postural control as well as ability to compensate for deficits.  He is able to complete ADL tasks with supervision.  He requires supervision for transfers and is able to ambulate up to 250 feet without assistive device. He is tolerating dysphagia 3 diet without signs or symptoms of aspiration.  He continues to have decreased recall of events demonstrate good reasoning and awareness.      Discharge disposition: 01-Home or Self Care  Diet: Heart Healthy/low potassium.   Special Instructions: Cleanse wounds with normal saline, pat dry, apply a layer of vaseline and cover with Xeroform guaze. Change dressing daily. 2.  Home health RN to draw be met on  07/11 with results to PCP and cardiology. 3. No driving for 6 months or till cleared by cardiology.    Discharge Instructions     Ambulatory referral to Physical Medicine Rehab   Complete by: As directed    Post hospital follow up      Allergies as of 06/27/2021   No Known Allergies      Medication List     STOP taking these medications    CALCIUM-D PO   Entresto 24-26 MG Generic drug: sacubitril-valsartan   Farxiga 5 MG Tabs tablet Generic drug: dapagliflozin propanediol   isosorbide mononitrate 30 MG 24 hr tablet Commonly known as: IMDUR   lisinopril 40 MG tablet Commonly known as: ZESTRIL   Oxycodone HCl 10 MG Tabs   simvastatin 20 MG tablet Commonly known as: ZOCOR   tiotropium 18 MCG inhalation capsule Commonly known as: SPIRIVA       TAKE these medications    acetaminophen 325 MG tablet Commonly known as: TYLENOL Take 1-2  tablets (325-650 mg total) by mouth every 4 (four) hours as needed for mild pain.   albuterol 108 (90 Base) MCG/ACT inhaler Commonly known as: VENTOLIN HFA Inhale 1-2 puffs into the lungs every 6 (six) hours as needed for wheezing or shortness of breath.   ALPRAZolam 0.25 MG tablet Commonly known as: XANAX Take 1 tablet (0.25 mg total) by mouth 3 (three) times daily as needed for anxiety. What changed: Another medication with the same name was removed. Continue taking this medication, and follow the directions you see here. Notes to patient: Note that its is lower than your home dose--do not take what you have at home.    amiodarone 200 MG tablet Commonly known as: PACERONE Take 1 tablet (200 mg total) by mouth daily. What changed:  medication strength how much to take when to take this   apixaban 5 MG Tabs tablet Commonly known as: ELIQUIS Take 5 mg by mouth 2 (two) times daily. What changed: Another medication with the same name was removed. Continue taking this medication, and follow the directions you see here.    aspirin 81 MG chewable tablet Chew 1 tablet (81 mg total) by mouth daily.   atorvastatin 80 MG tablet Commonly known as: LIPITOR Take 1 tablet (80 mg total) by mouth daily.   BiDil 20-37.5 MG tablet Generic drug: isosorbide-hydrALAZINE Take 1.5 tablets by mouth 3 (three) times daily.   budesonide-formoterol 160-4.5 MCG/ACT inhaler Commonly known as: SYMBICORT Inhale 2 puffs into the lungs 4 (four) times a week.   carvedilol 6.25 MG tablet Commonly known as: COREG Take 1 tablet (6.25 mg total) by mouth 2 (two) times daily with a meal. What changed:  medication strength additional instructions Another medication with the same name was removed. Continue taking this medication, and follow the directions you see here.   diphenhydrAMINE 12.5 MG/5ML elixir Commonly known as: BENADRYL Take 5-10 mLs (12.5-25 mg total) by mouth every 6 (six) hours as needed for itching.   gabapentin 300 MG capsule Commonly known as: NEURONTIN Take 1 capsule (300 mg total) by mouth at bedtime.   linaclotide 145 MCG Caps capsule Commonly known as: LINZESS Take 145 mcg by mouth daily as needed (constipation). What changed: Another medication with the same name was removed. Continue taking this medication, and follow the directions you see here.   melatonin 3 MG Tabs tablet Take 1 tablet (3 mg total) by mouth at bedtime as needed.   pantoprazole 40 MG tablet Commonly known as: PROTONIX Take 1 tablet (40 mg total) by mouth daily.   polyethylene glycol powder 17 GM/SCOOP powder Commonly known as: GLYCOLAX/MIRALAX Take 17 g by mouth 2 (two) times daily.   Senexon-S 8.6-50 MG tablet Generic drug: senna-docusate Take 2 tablets by mouth at bedtime.   sodium chloride 0.65 % Soln nasal spray Commonly known as: OCEAN Place 1 spray into both nostrils as needed for congestion.   VITAMIN C PO Take 1 tablet by mouth 4 (four) times a week.        Follow-up Information     Orpah Melter, MD.  Call on 06/30/2021.   Specialty: Family Medicine Why: for post hospital follow up Contact information: [address] [place] [address] [phone]         Belva Crome, MD. Call on 06/30/2021.   Specialty: Cardiology Contact information: [address] [place] [address] [phone]         Courtney Heys, MD Follow up.  Specialty: Physical Medicine and Rehabilitation Why: Office will call you with follow up appointment Contact information: [address] [place] [address] [phone]         Wallace Going, DO. Call on 06/30/2021.   Specialty: Plastic Surgery Why: for follow up on leg wounds. Contact information: [address] [place] [address] [phone]                 Signed: Bary Leriche 06/27/2021, 11:55 AM

## 2021-06-27 NOTE — Plan of Care (Signed)
Nutrition Education Note  RD consulted for nutrition education regarding a Heart Healthy diet and low potassium diet.  Lipid Panel     Component Value Date/Time   CHOL 96 (L) 10/24/2015 1340   TRIG 120 10/24/2015 1340   HDL 39 (L) 10/24/2015 1340   CHOLHDL 2.5 10/24/2015 1340   VLDL 24 10/24/2015 1340   LDLCALC 33 10/24/2015 1340    RD provided "Heart Healthy Nutrition Therapy" handout from the Academy of Nutrition and Dietetics. Reviewed patient's dietary recall. Pt reports consuming large amounts of fast food/restaurant fried foods prior to admission. Provided examples on ways to decrease sodium and fat intake in diet. Discouraged intake of processed foods and use of salt shaker. Encouraged fresh fruits and vegetables as well as whole grain sources of carbohydrates to maximize fiber intake. Discussed minimizing consumption of large quantities of high potassium containing foods as pt with history of high serum potassium recently and in the past and discussed recommended serving sizes. Teach back method used.  Expect good compliance.  Current diet order is dysphagia 3 diet with thin liquids, patient is consuming approximately 100% of meals at this time. Labs and medications reviewed. No further nutrition interventions warranted at this time. RD contact information provided. If additional nutrition issues arise, please re-consult RD.  Ross Parker, MS, RD, LDN RD pager number/after hours weekend pager number on Amion.

## 2021-06-30 DIAGNOSIS — J449 Chronic obstructive pulmonary disease, unspecified: Secondary | ICD-10-CM | POA: Diagnosis not present

## 2021-06-30 DIAGNOSIS — Z79891 Long term (current) use of opiate analgesic: Secondary | ICD-10-CM | POA: Diagnosis not present

## 2021-06-30 DIAGNOSIS — I255 Ischemic cardiomyopathy: Secondary | ICD-10-CM | POA: Diagnosis not present

## 2021-06-30 DIAGNOSIS — I5020 Unspecified systolic (congestive) heart failure: Secondary | ICD-10-CM | POA: Diagnosis not present

## 2021-06-30 DIAGNOSIS — Z9181 History of falling: Secondary | ICD-10-CM | POA: Diagnosis not present

## 2021-06-30 DIAGNOSIS — I2510 Atherosclerotic heart disease of native coronary artery without angina pectoris: Secondary | ICD-10-CM | POA: Diagnosis not present

## 2021-06-30 DIAGNOSIS — M1990 Unspecified osteoarthritis, unspecified site: Secondary | ICD-10-CM | POA: Diagnosis not present

## 2021-06-30 DIAGNOSIS — Z79899 Other long term (current) drug therapy: Secondary | ICD-10-CM | POA: Diagnosis not present

## 2021-06-30 DIAGNOSIS — T8089XD Other complications following infusion, transfusion and therapeutic injection, subsequent encounter: Secondary | ICD-10-CM | POA: Diagnosis not present

## 2021-06-30 DIAGNOSIS — E1142 Type 2 diabetes mellitus with diabetic polyneuropathy: Secondary | ICD-10-CM | POA: Diagnosis not present

## 2021-06-30 DIAGNOSIS — Z8674 Personal history of sudden cardiac arrest: Secondary | ICD-10-CM | POA: Diagnosis not present

## 2021-06-30 DIAGNOSIS — Z87891 Personal history of nicotine dependence: Secondary | ICD-10-CM | POA: Diagnosis not present

## 2021-06-30 DIAGNOSIS — T444X5D Adverse effect of predominantly alpha-adrenoreceptor agonists, subsequent encounter: Secondary | ICD-10-CM | POA: Diagnosis not present

## 2021-06-30 DIAGNOSIS — E785 Hyperlipidemia, unspecified: Secondary | ICD-10-CM | POA: Diagnosis not present

## 2021-06-30 DIAGNOSIS — Z950 Presence of cardiac pacemaker: Secondary | ICD-10-CM | POA: Diagnosis not present

## 2021-06-30 DIAGNOSIS — I110 Hypertensive heart disease with heart failure: Secondary | ICD-10-CM | POA: Diagnosis not present

## 2021-06-30 DIAGNOSIS — E1165 Type 2 diabetes mellitus with hyperglycemia: Secondary | ICD-10-CM | POA: Diagnosis not present

## 2021-06-30 DIAGNOSIS — G931 Anoxic brain damage, not elsewhere classified: Secondary | ICD-10-CM | POA: Diagnosis not present

## 2021-06-30 DIAGNOSIS — Z951 Presence of aortocoronary bypass graft: Secondary | ICD-10-CM | POA: Diagnosis not present

## 2021-06-30 DIAGNOSIS — I4819 Other persistent atrial fibrillation: Secondary | ICD-10-CM | POA: Diagnosis not present

## 2021-07-02 ENCOUNTER — Telehealth: Payer: Self-pay | Admitting: Interventional Cardiology

## 2021-07-02 DIAGNOSIS — Z951 Presence of aortocoronary bypass graft: Secondary | ICD-10-CM | POA: Diagnosis not present

## 2021-07-02 DIAGNOSIS — M1990 Unspecified osteoarthritis, unspecified site: Secondary | ICD-10-CM | POA: Diagnosis not present

## 2021-07-02 DIAGNOSIS — T8089XD Other complications following infusion, transfusion and therapeutic injection, subsequent encounter: Secondary | ICD-10-CM | POA: Diagnosis not present

## 2021-07-02 DIAGNOSIS — I5020 Unspecified systolic (congestive) heart failure: Secondary | ICD-10-CM | POA: Diagnosis not present

## 2021-07-02 DIAGNOSIS — I255 Ischemic cardiomyopathy: Secondary | ICD-10-CM | POA: Diagnosis not present

## 2021-07-02 DIAGNOSIS — E1165 Type 2 diabetes mellitus with hyperglycemia: Secondary | ICD-10-CM | POA: Diagnosis not present

## 2021-07-02 DIAGNOSIS — Z87891 Personal history of nicotine dependence: Secondary | ICD-10-CM | POA: Diagnosis not present

## 2021-07-02 DIAGNOSIS — I110 Hypertensive heart disease with heart failure: Secondary | ICD-10-CM | POA: Diagnosis not present

## 2021-07-02 DIAGNOSIS — G931 Anoxic brain damage, not elsewhere classified: Secondary | ICD-10-CM | POA: Diagnosis not present

## 2021-07-02 DIAGNOSIS — N179 Acute kidney failure, unspecified: Secondary | ICD-10-CM | POA: Diagnosis not present

## 2021-07-02 DIAGNOSIS — J449 Chronic obstructive pulmonary disease, unspecified: Secondary | ICD-10-CM | POA: Diagnosis not present

## 2021-07-02 DIAGNOSIS — Z8674 Personal history of sudden cardiac arrest: Secondary | ICD-10-CM | POA: Diagnosis not present

## 2021-07-02 DIAGNOSIS — Z79891 Long term (current) use of opiate analgesic: Secondary | ICD-10-CM | POA: Diagnosis not present

## 2021-07-02 DIAGNOSIS — T444X5D Adverse effect of predominantly alpha-adrenoreceptor agonists, subsequent encounter: Secondary | ICD-10-CM | POA: Diagnosis not present

## 2021-07-02 DIAGNOSIS — Z79899 Other long term (current) drug therapy: Secondary | ICD-10-CM | POA: Diagnosis not present

## 2021-07-02 DIAGNOSIS — E785 Hyperlipidemia, unspecified: Secondary | ICD-10-CM | POA: Diagnosis not present

## 2021-07-02 DIAGNOSIS — I4819 Other persistent atrial fibrillation: Secondary | ICD-10-CM | POA: Diagnosis not present

## 2021-07-02 DIAGNOSIS — Z9181 History of falling: Secondary | ICD-10-CM | POA: Diagnosis not present

## 2021-07-02 DIAGNOSIS — Z950 Presence of cardiac pacemaker: Secondary | ICD-10-CM | POA: Diagnosis not present

## 2021-07-02 DIAGNOSIS — E875 Hyperkalemia: Secondary | ICD-10-CM | POA: Diagnosis not present

## 2021-07-02 DIAGNOSIS — E1142 Type 2 diabetes mellitus with diabetic polyneuropathy: Secondary | ICD-10-CM | POA: Diagnosis not present

## 2021-07-02 DIAGNOSIS — I2510 Atherosclerotic heart disease of native coronary artery without angina pectoris: Secondary | ICD-10-CM | POA: Diagnosis not present

## 2021-07-02 NOTE — Telephone Encounter (Signed)
Spoke with pt and made him aware that he has the soonest available appt with Oda Kilts, PA-C right now.  Advised Dr. Tamala Julian is full next week and then out of the office for two weeks, so he needs to keep the appt with Jonni Sanger and then keep the appt with Dr. Tamala Julian in Sept.  Pt aware he can call the office if any issues arise prior to his appt.  Pt verbalized understanding.

## 2021-07-02 NOTE — Telephone Encounter (Signed)
Patient would like a call back from Dr. Thompson Caul nurse to find out when he needs to be seen. He states she "needs to ask Dr. Tamala Julian first". He states he was just in the hospital and told to follow up in "a timely manner".

## 2021-07-03 ENCOUNTER — Telehealth: Payer: Self-pay | Admitting: Interventional Cardiology

## 2021-07-03 ENCOUNTER — Telehealth: Payer: Self-pay

## 2021-07-03 DIAGNOSIS — Z79899 Other long term (current) drug therapy: Secondary | ICD-10-CM | POA: Diagnosis not present

## 2021-07-03 DIAGNOSIS — T444X5D Adverse effect of predominantly alpha-adrenoreceptor agonists, subsequent encounter: Secondary | ICD-10-CM | POA: Diagnosis not present

## 2021-07-03 DIAGNOSIS — Z8674 Personal history of sudden cardiac arrest: Secondary | ICD-10-CM | POA: Diagnosis not present

## 2021-07-03 DIAGNOSIS — I255 Ischemic cardiomyopathy: Secondary | ICD-10-CM | POA: Diagnosis not present

## 2021-07-03 DIAGNOSIS — Z950 Presence of cardiac pacemaker: Secondary | ICD-10-CM | POA: Diagnosis not present

## 2021-07-03 DIAGNOSIS — J449 Chronic obstructive pulmonary disease, unspecified: Secondary | ICD-10-CM | POA: Diagnosis not present

## 2021-07-03 DIAGNOSIS — Z9181 History of falling: Secondary | ICD-10-CM | POA: Diagnosis not present

## 2021-07-03 DIAGNOSIS — G931 Anoxic brain damage, not elsewhere classified: Secondary | ICD-10-CM | POA: Diagnosis not present

## 2021-07-03 DIAGNOSIS — I5020 Unspecified systolic (congestive) heart failure: Secondary | ICD-10-CM | POA: Diagnosis not present

## 2021-07-03 DIAGNOSIS — I110 Hypertensive heart disease with heart failure: Secondary | ICD-10-CM | POA: Diagnosis not present

## 2021-07-03 DIAGNOSIS — Z951 Presence of aortocoronary bypass graft: Secondary | ICD-10-CM | POA: Diagnosis not present

## 2021-07-03 DIAGNOSIS — Z79891 Long term (current) use of opiate analgesic: Secondary | ICD-10-CM | POA: Diagnosis not present

## 2021-07-03 DIAGNOSIS — I4819 Other persistent atrial fibrillation: Secondary | ICD-10-CM | POA: Diagnosis not present

## 2021-07-03 DIAGNOSIS — E1142 Type 2 diabetes mellitus with diabetic polyneuropathy: Secondary | ICD-10-CM | POA: Diagnosis not present

## 2021-07-03 DIAGNOSIS — I2510 Atherosclerotic heart disease of native coronary artery without angina pectoris: Secondary | ICD-10-CM | POA: Diagnosis not present

## 2021-07-03 DIAGNOSIS — T8089XD Other complications following infusion, transfusion and therapeutic injection, subsequent encounter: Secondary | ICD-10-CM | POA: Diagnosis not present

## 2021-07-03 DIAGNOSIS — Z87891 Personal history of nicotine dependence: Secondary | ICD-10-CM | POA: Diagnosis not present

## 2021-07-03 DIAGNOSIS — E1165 Type 2 diabetes mellitus with hyperglycemia: Secondary | ICD-10-CM | POA: Diagnosis not present

## 2021-07-03 DIAGNOSIS — M1990 Unspecified osteoarthritis, unspecified site: Secondary | ICD-10-CM | POA: Diagnosis not present

## 2021-07-03 DIAGNOSIS — E785 Hyperlipidemia, unspecified: Secondary | ICD-10-CM | POA: Diagnosis not present

## 2021-07-03 NOTE — Telephone Encounter (Signed)
New Message:    Daughter is calling to find out about patient's appointment. Pt had a heart attack and was discharged on 06-27-21.  She said  the patient was in the hospital for about a month.Patient have an appointment with Oda Kilts on 07-22-21., about  his Life Vest. His next appointment with Dr Tamala Julian is in September. Daughter wanted to be sure that patient does not need to be seen before September. She is very concerned about patient's medicine, because some of it was changed in the hospital.

## 2021-07-03 NOTE — Telephone Encounter (Signed)
Returned call to Pt's daughter.  Daughter was concerned because she did not understand Ross [name]'s role in Pt care.  Provided education on upcoming plan for Pt.  Daughter thanked Marine scientist for clarification of plan.  She is from [place] and is here helping father recover.  All questions answered.

## 2021-07-03 NOTE — Telephone Encounter (Signed)
Shelda Pal, SP from [facility] called requesting HHST 1wk9 starting today for cognition. Orders approved and given.

## 2021-07-07 DIAGNOSIS — J449 Chronic obstructive pulmonary disease, unspecified: Secondary | ICD-10-CM | POA: Diagnosis not present

## 2021-07-07 DIAGNOSIS — Z951 Presence of aortocoronary bypass graft: Secondary | ICD-10-CM | POA: Diagnosis not present

## 2021-07-07 DIAGNOSIS — E1165 Type 2 diabetes mellitus with hyperglycemia: Secondary | ICD-10-CM | POA: Diagnosis not present

## 2021-07-07 DIAGNOSIS — I110 Hypertensive heart disease with heart failure: Secondary | ICD-10-CM | POA: Diagnosis not present

## 2021-07-07 DIAGNOSIS — E785 Hyperlipidemia, unspecified: Secondary | ICD-10-CM | POA: Diagnosis not present

## 2021-07-07 DIAGNOSIS — Z9181 History of falling: Secondary | ICD-10-CM | POA: Diagnosis not present

## 2021-07-07 DIAGNOSIS — Z79899 Other long term (current) drug therapy: Secondary | ICD-10-CM | POA: Diagnosis not present

## 2021-07-07 DIAGNOSIS — I4819 Other persistent atrial fibrillation: Secondary | ICD-10-CM | POA: Diagnosis not present

## 2021-07-07 DIAGNOSIS — T444X5D Adverse effect of predominantly alpha-adrenoreceptor agonists, subsequent encounter: Secondary | ICD-10-CM | POA: Diagnosis not present

## 2021-07-07 DIAGNOSIS — M1990 Unspecified osteoarthritis, unspecified site: Secondary | ICD-10-CM | POA: Diagnosis not present

## 2021-07-07 DIAGNOSIS — Z8674 Personal history of sudden cardiac arrest: Secondary | ICD-10-CM | POA: Diagnosis not present

## 2021-07-07 DIAGNOSIS — Z87891 Personal history of nicotine dependence: Secondary | ICD-10-CM | POA: Diagnosis not present

## 2021-07-07 DIAGNOSIS — I255 Ischemic cardiomyopathy: Secondary | ICD-10-CM | POA: Diagnosis not present

## 2021-07-07 DIAGNOSIS — I2510 Atherosclerotic heart disease of native coronary artery without angina pectoris: Secondary | ICD-10-CM | POA: Diagnosis not present

## 2021-07-07 DIAGNOSIS — E1142 Type 2 diabetes mellitus with diabetic polyneuropathy: Secondary | ICD-10-CM | POA: Diagnosis not present

## 2021-07-07 DIAGNOSIS — Z950 Presence of cardiac pacemaker: Secondary | ICD-10-CM | POA: Diagnosis not present

## 2021-07-07 DIAGNOSIS — Z79891 Long term (current) use of opiate analgesic: Secondary | ICD-10-CM | POA: Diagnosis not present

## 2021-07-07 DIAGNOSIS — I5020 Unspecified systolic (congestive) heart failure: Secondary | ICD-10-CM | POA: Diagnosis not present

## 2021-07-07 DIAGNOSIS — T8089XD Other complications following infusion, transfusion and therapeutic injection, subsequent encounter: Secondary | ICD-10-CM | POA: Diagnosis not present

## 2021-07-07 DIAGNOSIS — G931 Anoxic brain damage, not elsewhere classified: Secondary | ICD-10-CM | POA: Diagnosis not present

## 2021-07-09 ENCOUNTER — Ambulatory Visit: Payer: Medicare Other | Admitting: Podiatry

## 2021-07-09 DIAGNOSIS — Z20822 Contact with and (suspected) exposure to covid-19: Secondary | ICD-10-CM | POA: Diagnosis not present

## 2021-07-10 ENCOUNTER — Telehealth: Payer: Self-pay

## 2021-07-10 NOTE — Telephone Encounter (Signed)
Missed appt for speech today

## 2021-07-11 ENCOUNTER — Ambulatory Visit: Payer: Medicare Other | Admitting: Plastic Surgery

## 2021-07-11 DIAGNOSIS — E785 Hyperlipidemia, unspecified: Secondary | ICD-10-CM | POA: Diagnosis not present

## 2021-07-11 DIAGNOSIS — Z950 Presence of cardiac pacemaker: Secondary | ICD-10-CM | POA: Diagnosis not present

## 2021-07-11 DIAGNOSIS — M1990 Unspecified osteoarthritis, unspecified site: Secondary | ICD-10-CM | POA: Diagnosis not present

## 2021-07-11 DIAGNOSIS — Z9181 History of falling: Secondary | ICD-10-CM | POA: Diagnosis not present

## 2021-07-11 DIAGNOSIS — Z79891 Long term (current) use of opiate analgesic: Secondary | ICD-10-CM | POA: Diagnosis not present

## 2021-07-11 DIAGNOSIS — G931 Anoxic brain damage, not elsewhere classified: Secondary | ICD-10-CM | POA: Diagnosis not present

## 2021-07-11 DIAGNOSIS — Z951 Presence of aortocoronary bypass graft: Secondary | ICD-10-CM | POA: Diagnosis not present

## 2021-07-11 DIAGNOSIS — T444X5D Adverse effect of predominantly alpha-adrenoreceptor agonists, subsequent encounter: Secondary | ICD-10-CM | POA: Diagnosis not present

## 2021-07-11 DIAGNOSIS — I255 Ischemic cardiomyopathy: Secondary | ICD-10-CM | POA: Diagnosis not present

## 2021-07-11 DIAGNOSIS — Z79899 Other long term (current) drug therapy: Secondary | ICD-10-CM | POA: Diagnosis not present

## 2021-07-11 DIAGNOSIS — E1142 Type 2 diabetes mellitus with diabetic polyneuropathy: Secondary | ICD-10-CM | POA: Diagnosis not present

## 2021-07-11 DIAGNOSIS — I110 Hypertensive heart disease with heart failure: Secondary | ICD-10-CM | POA: Diagnosis not present

## 2021-07-11 DIAGNOSIS — I4819 Other persistent atrial fibrillation: Secondary | ICD-10-CM | POA: Diagnosis not present

## 2021-07-11 DIAGNOSIS — I2510 Atherosclerotic heart disease of native coronary artery without angina pectoris: Secondary | ICD-10-CM | POA: Diagnosis not present

## 2021-07-11 DIAGNOSIS — T8089XD Other complications following infusion, transfusion and therapeutic injection, subsequent encounter: Secondary | ICD-10-CM | POA: Diagnosis not present

## 2021-07-11 DIAGNOSIS — Z87891 Personal history of nicotine dependence: Secondary | ICD-10-CM | POA: Diagnosis not present

## 2021-07-11 DIAGNOSIS — I5020 Unspecified systolic (congestive) heart failure: Secondary | ICD-10-CM | POA: Diagnosis not present

## 2021-07-11 DIAGNOSIS — E1165 Type 2 diabetes mellitus with hyperglycemia: Secondary | ICD-10-CM | POA: Diagnosis not present

## 2021-07-11 DIAGNOSIS — J449 Chronic obstructive pulmonary disease, unspecified: Secondary | ICD-10-CM | POA: Diagnosis not present

## 2021-07-11 DIAGNOSIS — Z8674 Personal history of sudden cardiac arrest: Secondary | ICD-10-CM | POA: Diagnosis not present

## 2021-07-14 ENCOUNTER — Telehealth: Payer: Self-pay | Admitting: Family Medicine

## 2021-07-14 NOTE — Telephone Encounter (Signed)
   HONDO NANDA DOB: 1956-08-21 MRN: [id number]   RIDER WAIVER AND RELEASE OF LIABILITY  For purposes of improving physical access to our facilities, [facility] is pleased to partner with third parties to provide [place] patients or other authorized individuals the option of convenient, on-demand ground transportation services (the Technical brewer") through use of the technology service that enables users to request on-demand ground transportation from independent third-party providers.  By opting to use and accept these [employer], I, the undersigned, hereby agree on behalf of myself, and on behalf of any minor child using the Government social research officer for whom I am the parent or legal guardian, as follows:  Government social research officer provided to me are provided by independent third-party transportation providers who are not [employer] or employees and who are unaffiliated with [employer]. [facility] is neither a transportation carrier nor a common or public carrier. [facility] has no control over the quality or safety of the transportation that occurs as a result of the [employer]. [place] cannot guarantee that any third-party transportation provider will complete any arranged transportation service. [facility] makes no representation, warranty, or guarantee regarding the reliability, timeliness, quality, safety, suitability, or availability of any of the Transport Services or that they will be error free. I fully understand that traveling by vehicle involves risks and dangers of serious bodily injury, including permanent disability, paralysis, and death. I agree, on behalf of myself and on behalf of any minor child using the Transport Services for whom I am the parent or legal guardian, that the entire risk arising out of my use of the [employer] remains solely with me, to the maximum extent permitted under applicable law. The [employer] are provided "as  is" and "as available." [facility] disclaims all representations and warranties, express, implied or statutory, not expressly set out in these terms, including the implied warranties of merchantability and fitness for a particular purpose. I hereby waive and release [facility], its agents, employees, officers, directors, representatives, insurers, attorneys, assigns, successors, subsidiaries, and affiliates from any and all past, present, or future claims, demands, liabilities, actions, causes of action, or suits of any kind directly or indirectly arising from acceptance and use of the [employer]. I further waive and release [facility] and its affiliates from all present and future liability and responsibility for any injury or death to persons or damages to property caused by or related to the use of the [employer]. I have read this Waiver and Release of Liability, and I understand the terms used in it and their legal significance. This Waiver is freely and voluntarily given with the understanding that my right (as well as the right of any minor child for whom I am the parent or legal guardian using the [employer]) to legal recourse against [facility] in connection with the [employer] is knowingly surrendered in return for use of these services.   I attest that I read the consent document to Bobbye Morton, gave Mr. Pundt the opportunity to ask questions and answered the questions asked (if any). I affirm that Bobbye Morton then provided consent for he's participation in this program.     Legrand Pitts

## 2021-07-16 DIAGNOSIS — Z8674 Personal history of sudden cardiac arrest: Secondary | ICD-10-CM | POA: Diagnosis not present

## 2021-07-16 DIAGNOSIS — I110 Hypertensive heart disease with heart failure: Secondary | ICD-10-CM | POA: Diagnosis not present

## 2021-07-16 DIAGNOSIS — Z950 Presence of cardiac pacemaker: Secondary | ICD-10-CM | POA: Diagnosis not present

## 2021-07-16 DIAGNOSIS — Z9181 History of falling: Secondary | ICD-10-CM | POA: Diagnosis not present

## 2021-07-16 DIAGNOSIS — E1165 Type 2 diabetes mellitus with hyperglycemia: Secondary | ICD-10-CM | POA: Diagnosis not present

## 2021-07-16 DIAGNOSIS — G931 Anoxic brain damage, not elsewhere classified: Secondary | ICD-10-CM | POA: Diagnosis not present

## 2021-07-16 DIAGNOSIS — T444X5D Adverse effect of predominantly alpha-adrenoreceptor agonists, subsequent encounter: Secondary | ICD-10-CM | POA: Diagnosis not present

## 2021-07-16 DIAGNOSIS — E785 Hyperlipidemia, unspecified: Secondary | ICD-10-CM | POA: Diagnosis not present

## 2021-07-16 DIAGNOSIS — I5020 Unspecified systolic (congestive) heart failure: Secondary | ICD-10-CM | POA: Diagnosis not present

## 2021-07-16 DIAGNOSIS — I4819 Other persistent atrial fibrillation: Secondary | ICD-10-CM | POA: Diagnosis not present

## 2021-07-16 DIAGNOSIS — M1990 Unspecified osteoarthritis, unspecified site: Secondary | ICD-10-CM | POA: Diagnosis not present

## 2021-07-16 DIAGNOSIS — Z951 Presence of aortocoronary bypass graft: Secondary | ICD-10-CM | POA: Diagnosis not present

## 2021-07-16 DIAGNOSIS — Z79899 Other long term (current) drug therapy: Secondary | ICD-10-CM | POA: Diagnosis not present

## 2021-07-16 DIAGNOSIS — E1142 Type 2 diabetes mellitus with diabetic polyneuropathy: Secondary | ICD-10-CM | POA: Diagnosis not present

## 2021-07-16 DIAGNOSIS — J449 Chronic obstructive pulmonary disease, unspecified: Secondary | ICD-10-CM | POA: Diagnosis not present

## 2021-07-16 DIAGNOSIS — T8089XD Other complications following infusion, transfusion and therapeutic injection, subsequent encounter: Secondary | ICD-10-CM | POA: Diagnosis not present

## 2021-07-16 DIAGNOSIS — I255 Ischemic cardiomyopathy: Secondary | ICD-10-CM | POA: Diagnosis not present

## 2021-07-16 DIAGNOSIS — Z79891 Long term (current) use of opiate analgesic: Secondary | ICD-10-CM | POA: Diagnosis not present

## 2021-07-16 DIAGNOSIS — Z87891 Personal history of nicotine dependence: Secondary | ICD-10-CM | POA: Diagnosis not present

## 2021-07-16 DIAGNOSIS — I2510 Atherosclerotic heart disease of native coronary artery without angina pectoris: Secondary | ICD-10-CM | POA: Diagnosis not present

## 2021-07-17 DIAGNOSIS — I700 Atherosclerosis of aorta: Secondary | ICD-10-CM | POA: Diagnosis not present

## 2021-07-17 DIAGNOSIS — E1159 Type 2 diabetes mellitus with other circulatory complications: Secondary | ICD-10-CM | POA: Diagnosis not present

## 2021-07-17 DIAGNOSIS — I469 Cardiac arrest, cause unspecified: Secondary | ICD-10-CM | POA: Diagnosis not present

## 2021-07-17 DIAGNOSIS — I10 Essential (primary) hypertension: Secondary | ICD-10-CM | POA: Diagnosis not present

## 2021-07-18 DIAGNOSIS — J449 Chronic obstructive pulmonary disease, unspecified: Secondary | ICD-10-CM | POA: Diagnosis not present

## 2021-07-18 DIAGNOSIS — E1165 Type 2 diabetes mellitus with hyperglycemia: Secondary | ICD-10-CM | POA: Diagnosis not present

## 2021-07-18 DIAGNOSIS — I4819 Other persistent atrial fibrillation: Secondary | ICD-10-CM | POA: Diagnosis not present

## 2021-07-18 DIAGNOSIS — Z87891 Personal history of nicotine dependence: Secondary | ICD-10-CM | POA: Diagnosis not present

## 2021-07-18 DIAGNOSIS — Z8674 Personal history of sudden cardiac arrest: Secondary | ICD-10-CM | POA: Diagnosis not present

## 2021-07-18 DIAGNOSIS — Z9181 History of falling: Secondary | ICD-10-CM | POA: Diagnosis not present

## 2021-07-18 DIAGNOSIS — I110 Hypertensive heart disease with heart failure: Secondary | ICD-10-CM | POA: Diagnosis not present

## 2021-07-18 DIAGNOSIS — T444X5D Adverse effect of predominantly alpha-adrenoreceptor agonists, subsequent encounter: Secondary | ICD-10-CM | POA: Diagnosis not present

## 2021-07-18 DIAGNOSIS — Z950 Presence of cardiac pacemaker: Secondary | ICD-10-CM | POA: Diagnosis not present

## 2021-07-18 DIAGNOSIS — Z79899 Other long term (current) drug therapy: Secondary | ICD-10-CM | POA: Diagnosis not present

## 2021-07-18 DIAGNOSIS — M1990 Unspecified osteoarthritis, unspecified site: Secondary | ICD-10-CM | POA: Diagnosis not present

## 2021-07-18 DIAGNOSIS — E1142 Type 2 diabetes mellitus with diabetic polyneuropathy: Secondary | ICD-10-CM | POA: Diagnosis not present

## 2021-07-18 DIAGNOSIS — G931 Anoxic brain damage, not elsewhere classified: Secondary | ICD-10-CM | POA: Diagnosis not present

## 2021-07-18 DIAGNOSIS — I255 Ischemic cardiomyopathy: Secondary | ICD-10-CM | POA: Diagnosis not present

## 2021-07-18 DIAGNOSIS — I2510 Atherosclerotic heart disease of native coronary artery without angina pectoris: Secondary | ICD-10-CM | POA: Diagnosis not present

## 2021-07-18 DIAGNOSIS — T8089XD Other complications following infusion, transfusion and therapeutic injection, subsequent encounter: Secondary | ICD-10-CM | POA: Diagnosis not present

## 2021-07-18 DIAGNOSIS — I5020 Unspecified systolic (congestive) heart failure: Secondary | ICD-10-CM | POA: Diagnosis not present

## 2021-07-18 DIAGNOSIS — Z79891 Long term (current) use of opiate analgesic: Secondary | ICD-10-CM | POA: Diagnosis not present

## 2021-07-18 DIAGNOSIS — Z951 Presence of aortocoronary bypass graft: Secondary | ICD-10-CM | POA: Diagnosis not present

## 2021-07-18 DIAGNOSIS — E785 Hyperlipidemia, unspecified: Secondary | ICD-10-CM | POA: Diagnosis not present

## 2021-07-20 DIAGNOSIS — E1159 Type 2 diabetes mellitus with other circulatory complications: Secondary | ICD-10-CM | POA: Diagnosis not present

## 2021-07-20 DIAGNOSIS — M170 Bilateral primary osteoarthritis of knee: Secondary | ICD-10-CM | POA: Diagnosis not present

## 2021-07-20 DIAGNOSIS — I5042 Chronic combined systolic (congestive) and diastolic (congestive) heart failure: Secondary | ICD-10-CM | POA: Diagnosis not present

## 2021-07-20 DIAGNOSIS — I10 Essential (primary) hypertension: Secondary | ICD-10-CM | POA: Diagnosis not present

## 2021-07-20 DIAGNOSIS — I2510 Atherosclerotic heart disease of native coronary artery without angina pectoris: Secondary | ICD-10-CM | POA: Diagnosis not present

## 2021-07-20 DIAGNOSIS — E7800 Pure hypercholesterolemia, unspecified: Secondary | ICD-10-CM | POA: Diagnosis not present

## 2021-07-20 DIAGNOSIS — I119 Hypertensive heart disease without heart failure: Secondary | ICD-10-CM | POA: Diagnosis not present

## 2021-07-20 DIAGNOSIS — E1140 Type 2 diabetes mellitus with diabetic neuropathy, unspecified: Secondary | ICD-10-CM | POA: Diagnosis not present

## 2021-07-20 DIAGNOSIS — J449 Chronic obstructive pulmonary disease, unspecified: Secondary | ICD-10-CM | POA: Diagnosis not present

## 2021-07-20 DIAGNOSIS — I4891 Unspecified atrial fibrillation: Secondary | ICD-10-CM | POA: Diagnosis not present

## 2021-07-21 DIAGNOSIS — I2510 Atherosclerotic heart disease of native coronary artery without angina pectoris: Secondary | ICD-10-CM | POA: Diagnosis not present

## 2021-07-21 DIAGNOSIS — I110 Hypertensive heart disease with heart failure: Secondary | ICD-10-CM | POA: Diagnosis not present

## 2021-07-21 DIAGNOSIS — T8089XD Other complications following infusion, transfusion and therapeutic injection, subsequent encounter: Secondary | ICD-10-CM | POA: Diagnosis not present

## 2021-07-21 DIAGNOSIS — Z8674 Personal history of sudden cardiac arrest: Secondary | ICD-10-CM | POA: Diagnosis not present

## 2021-07-21 DIAGNOSIS — Z9181 History of falling: Secondary | ICD-10-CM | POA: Diagnosis not present

## 2021-07-21 DIAGNOSIS — E1165 Type 2 diabetes mellitus with hyperglycemia: Secondary | ICD-10-CM | POA: Diagnosis not present

## 2021-07-21 DIAGNOSIS — I255 Ischemic cardiomyopathy: Secondary | ICD-10-CM | POA: Diagnosis not present

## 2021-07-21 DIAGNOSIS — T444X5D Adverse effect of predominantly alpha-adrenoreceptor agonists, subsequent encounter: Secondary | ICD-10-CM | POA: Diagnosis not present

## 2021-07-21 DIAGNOSIS — Z951 Presence of aortocoronary bypass graft: Secondary | ICD-10-CM | POA: Diagnosis not present

## 2021-07-21 DIAGNOSIS — G931 Anoxic brain damage, not elsewhere classified: Secondary | ICD-10-CM | POA: Diagnosis not present

## 2021-07-21 DIAGNOSIS — Z79891 Long term (current) use of opiate analgesic: Secondary | ICD-10-CM | POA: Diagnosis not present

## 2021-07-21 DIAGNOSIS — I5020 Unspecified systolic (congestive) heart failure: Secondary | ICD-10-CM | POA: Diagnosis not present

## 2021-07-21 DIAGNOSIS — Z950 Presence of cardiac pacemaker: Secondary | ICD-10-CM | POA: Diagnosis not present

## 2021-07-21 DIAGNOSIS — J449 Chronic obstructive pulmonary disease, unspecified: Secondary | ICD-10-CM | POA: Diagnosis not present

## 2021-07-21 DIAGNOSIS — I4819 Other persistent atrial fibrillation: Secondary | ICD-10-CM | POA: Diagnosis not present

## 2021-07-21 DIAGNOSIS — Z87891 Personal history of nicotine dependence: Secondary | ICD-10-CM | POA: Diagnosis not present

## 2021-07-21 DIAGNOSIS — Z79899 Other long term (current) drug therapy: Secondary | ICD-10-CM | POA: Diagnosis not present

## 2021-07-21 DIAGNOSIS — E785 Hyperlipidemia, unspecified: Secondary | ICD-10-CM | POA: Diagnosis not present

## 2021-07-21 DIAGNOSIS — M1990 Unspecified osteoarthritis, unspecified site: Secondary | ICD-10-CM | POA: Diagnosis not present

## 2021-07-21 DIAGNOSIS — E1142 Type 2 diabetes mellitus with diabetic polyneuropathy: Secondary | ICD-10-CM | POA: Diagnosis not present
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
## 2021-07-22 ENCOUNTER — Other Ambulatory Visit: Payer: Self-pay

## 2021-07-22 ENCOUNTER — Ambulatory Visit (INDEPENDENT_AMBULATORY_CARE_PROVIDER_SITE_OTHER): Payer: Medicare Other | Admitting: Plastic Surgery

## 2021-07-22 ENCOUNTER — Ambulatory Visit (INDEPENDENT_AMBULATORY_CARE_PROVIDER_SITE_OTHER): Payer: Medicare Other | Admitting: Student

## 2021-07-22 ENCOUNTER — Other Ambulatory Visit (HOSPITAL_COMMUNITY): Payer: Self-pay

## 2021-07-22 ENCOUNTER — Encounter: Payer: Self-pay | Admitting: Student

## 2021-07-22 ENCOUNTER — Encounter: Payer: Self-pay | Admitting: Plastic Surgery

## 2021-07-22 VITALS — BP 98/52 | HR 61 | Ht 76.0 in | Wt 242.0 lb

## 2021-07-22 DIAGNOSIS — I4901 Ventricular fibrillation: Secondary | ICD-10-CM | POA: Diagnosis not present

## 2021-07-22 DIAGNOSIS — I482 Chronic atrial fibrillation, unspecified: Secondary | ICD-10-CM

## 2021-07-22 DIAGNOSIS — Z950 Presence of cardiac pacemaker: Secondary | ICD-10-CM

## 2021-07-22 DIAGNOSIS — I495 Sick sinus syndrome: Secondary | ICD-10-CM | POA: Diagnosis not present

## 2021-07-22 DIAGNOSIS — I5042 Chronic combined systolic (congestive) and diastolic (congestive) heart failure: Secondary | ICD-10-CM

## 2021-07-22 DIAGNOSIS — I4821 Permanent atrial fibrillation: Secondary | ICD-10-CM | POA: Diagnosis not present

## 2021-07-22 DIAGNOSIS — I25709 Atherosclerosis of coronary artery bypass graft(s), unspecified, with unspecified angina pectoris: Secondary | ICD-10-CM | POA: Diagnosis not present

## 2021-07-22 DIAGNOSIS — I96 Gangrene, not elsewhere classified: Secondary | ICD-10-CM

## 2021-07-22 LAB — CUP PACEART INCLINIC DEVICE CHECK
Battery Remaining Longevity: 73 mo
Battery Voltage: 3 V
Brady Statistic RV Percent Paced: 98.5 %
Date Time Interrogation Session: 20220802113749
Implantable Lead Implant Date: 20170126
Implantable Lead Location: 753860
Implantable Lead Model: 5076
Implantable Pulse Generator Implant Date: 20170126
Lead Channel Impedance Value: 361 Ohm
Lead Channel Impedance Value: 399 Ohm
Lead Channel Pacing Threshold Amplitude: 1.125 V
Lead Channel Pacing Threshold Pulse Width: 0.4 ms
Lead Channel Sensing Intrinsic Amplitude: 6.25 mV
Lead Channel Sensing Intrinsic Amplitude: 6.75 mV
Lead Channel Setting Pacing Amplitude: 2.75 V
Lead Channel Setting Pacing Pulse Width: 0.4 ms
Lead Channel Setting Sensing Sensitivity: 2 mV

## 2021-07-22 MED ORDER — SANTYL 250 UNIT/GM EX OINT
TOPICAL_OINTMENT | CUTANEOUS | 1 refills | Status: DC
  Filled 2021-07-22: qty 30, 20d supply, fill #0

## 2021-07-22 MED ORDER — ATORVASTATIN CALCIUM 80 MG PO TABS
80.0000 mg | ORAL_TABLET | Freq: Every day | ORAL | 1 refills | Status: DC
  Filled 2021-07-22: qty 90, 90d supply, fill #0
  Filled 2021-10-22: qty 90, 90d supply, fill #1

## 2021-07-22 MED ORDER — CARVEDILOL 6.25 MG PO TABS
6.2500 mg | ORAL_TABLET | Freq: Two times a day (BID) | ORAL | 1 refills | Status: DC
  Filled 2021-07-22: qty 180, 90d supply, fill #0
  Filled 2021-10-22: qty 180, 90d supply, fill #1

## 2021-07-22 MED ORDER — ISOSORB DINITRATE-HYDRALAZINE 20-37.5 MG PO TABS
1.5000 | ORAL_TABLET | Freq: Three times a day (TID) | ORAL | 1 refills | Status: DC
  Filled 2021-07-22: qty 135, 30d supply, fill #0
  Filled 2021-08-26: qty 135, 30d supply, fill #1
  Filled 2021-09-15: qty 135, 30d supply, fill #2

## 2021-07-22 MED ORDER — GABAPENTIN 300 MG PO CAPS
300.0000 mg | ORAL_CAPSULE | Freq: Every day | ORAL | 0 refills | Status: DC
  Filled 2021-07-22 – 2021-07-25 (×2): qty 30, 30d supply, fill #0

## 2021-07-22 MED ORDER — APIXABAN 5 MG PO TABS
5.0000 mg | ORAL_TABLET | Freq: Two times a day (BID) | ORAL | 5 refills | Status: DC
  Filled 2021-07-22: qty 60, 30d supply, fill #0
  Filled 2021-08-26 – 2021-09-24 (×2): qty 60, 30d supply, fill #1
  Filled 2021-10-24: qty 60, 30d supply, fill #2
  Filled 2022-01-15 – 2022-03-12 (×2): qty 60, 30d supply, fill #3
  Filled 2022-04-07: qty 60, 30d supply, fill #4
  Filled 2022-05-05: qty 60, 30d supply, fill #5

## 2021-07-22 MED ORDER — AMIODARONE HCL 200 MG PO TABS
200.0000 mg | ORAL_TABLET | Freq: Every day | ORAL | 1 refills | Status: DC
  Filled 2021-07-22: qty 90, 90d supply, fill #0
  Filled 2021-10-22: qty 90, 90d supply, fill #1

## 2021-07-22 NOTE — Progress Notes (Signed)
   Subjective:    Patient ID: Ross [name], male    DOB: [birth year], 65 y.o.   MRN: [id number]  The patient is a 65 year old male here for follow-up after IV contrast infiltration to his left arm and left lower leg.  This occurred several weeks ago when the patient had a heart attack and was being treated.  He was in the ICU.  The area has not yet declared itself and while the patient was in the hospital he declined surgery.  The area on his left arm is 2-1/2 x 2-1/2 cm with a dark black eschar.  The area on his leg is 4 x 10 cm and also has a black eschar.  The periwound area is irritated with a little bit of rash around it.  This may be from the dressing.  He was accompanied by his wife who is very helpful and necessary for successful visit.   Review of Systems  Constitutional:  Positive for activity change. Negative for appetite change.  Eyes: Negative.   Respiratory: Negative.  Negative for chest tightness.   Cardiovascular:  Positive for leg swelling.  Endocrine: Negative.   Genitourinary: Negative.   Skin:  Positive for wound.  Psychiatric/Behavioral: Negative.        Objective:   Physical Exam Constitutional:      Appearance: Normal appearance.  Cardiovascular:     Rate and Rhythm: Normal rate.     Pulses: Normal pulses.  Pulmonary:     Effort: Pulmonary effort is normal. No respiratory distress.  Abdominal:     General: Abdomen is flat.  Musculoskeletal:       Arms:       Legs:  Skin:    Capillary Refill: Capillary refill takes less than 2 seconds.     Coloration: Skin is not pale.     Findings: Bruising, erythema, lesion and rash present.  Neurological:     Mental Status: He is alert. Mental status is at baseline.       Assessment & Plan:     ICD-10-CM   1. Skin necrosis ([place])  I96       The eschar was excised using a 15 blade on the 4 x 10 cm leg wound and the 2-1/2 x 2 and half centimeter left arm wound.  Patient tolerated the procedure well.  He was on  Eliquis so only the eschar was excised.  Recommendation is for Santyl to be used every other day.  We will see the patient back in 1 week.  The Santyl was called into [facility] pharmacy per patient request.

## 2021-07-22 NOTE — Patient Instructions (Addendum)
Medication Instructions:  Your physician recommends that you continue on your current medications as directed. Please refer to the Current Medication list given to you today.   *If you need a refill on your cardiac medications before your next appointment, please call your pharmacy*   Lab Work: None If you have labs (blood work) drawn today and your tests are completely normal, you will receive your results only by: [facility] (if you have MyChart) OR A paper copy in the mail If you have any lab test that is abnormal or we need to change your treatment, we will call you to review the results.   Follow-Up: At [facility], you and your health needs are our priority.  As part of our continuing mission to provide you with exceptional heart care, we have created designated Provider Care Teams.  These Care Teams include your primary Cardiologist (physician) and Advanced Practice Providers (APPs -  Physician Assistants and Nurse Practitioners) who all work together to provide you with the care you need, when you need it.   Your next appointment:   As scheduled  Other Instructions HEART FAILURE CLINIC INFORMATION: Your appointment is scheduled on:               at               . Please arrive 15 minutes early for check-in. The AFib Clinic is located in the Heart and Vascular Specialty Clinics at [facility]. Parking instructions/directions: [address] (off [address]). When you pull in to Entrance C, there is an underground parking garage to your right. The code to enter the garage is 5544 (August). Take the elevators to the first floor. Follow the signs to the Heart and Vascular Specialty Clinics. You will see registration at the end of the hallway.  Phone number: [phone]

## 2021-07-23 ENCOUNTER — Telehealth: Payer: Self-pay | Admitting: *Deleted

## 2021-07-23 ENCOUNTER — Telehealth: Payer: Self-pay

## 2021-07-23 ENCOUNTER — Telehealth: Payer: Self-pay | Admitting: Plastic Surgery

## 2021-07-23 ENCOUNTER — Other Ambulatory Visit (HOSPITAL_COMMUNITY): Payer: Self-pay

## 2021-07-23 DIAGNOSIS — L97923 Non-pressure chronic ulcer of unspecified part of left lower leg with necrosis of muscle: Secondary | ICD-10-CM | POA: Diagnosis not present

## 2021-07-23 NOTE — Telephone Encounter (Signed)
Faxed order back to [facility] with the frequency changes for wound 2.  Confirmation received and copy scanned into the chart.//AB/CMA

## 2021-07-23 NOTE — Telephone Encounter (Signed)
Levada Dy, patient care manager from [facility] called to report that the patient is refusing to let the [facility] come back and they have no other nurse in the vicinity. His girlfriend thought he was flirting with the nurse and so he will not see her again.  Of note, he has cancelled his appt with this office stating he does not want to come.  [facility] is concerned because he has wounds. They have PT OT in the home but may have to discharge so he can be referred to another agency.  But if patient is not following up here, what should be done?

## 2021-07-23 NOTE — Telephone Encounter (Signed)
Carrie from [employer] called to advise they received an order for 2 wounds and they need more information. They need to know what products to use and the frequency of change for 2nd wound. Please call [phone] to advise.

## 2021-07-23 NOTE — Telephone Encounter (Signed)
Levada Dy called from [facility] to let us know that the patient has refused further nursing services.  Please call.

## 2021-07-24 DIAGNOSIS — T8089XD Other complications following infusion, transfusion and therapeutic injection, subsequent encounter: Secondary | ICD-10-CM | POA: Diagnosis not present

## 2021-07-24 DIAGNOSIS — Z951 Presence of aortocoronary bypass graft: Secondary | ICD-10-CM | POA: Diagnosis not present

## 2021-07-24 DIAGNOSIS — Z950 Presence of cardiac pacemaker: Secondary | ICD-10-CM | POA: Diagnosis not present

## 2021-07-24 DIAGNOSIS — Z8674 Personal history of sudden cardiac arrest: Secondary | ICD-10-CM | POA: Diagnosis not present

## 2021-07-24 DIAGNOSIS — I110 Hypertensive heart disease with heart failure: Secondary | ICD-10-CM | POA: Diagnosis not present

## 2021-07-24 DIAGNOSIS — I255 Ischemic cardiomyopathy: Secondary | ICD-10-CM | POA: Diagnosis not present

## 2021-07-24 DIAGNOSIS — Z79899 Other long term (current) drug therapy: Secondary | ICD-10-CM | POA: Diagnosis not present

## 2021-07-24 DIAGNOSIS — I4819 Other persistent atrial fibrillation: Secondary | ICD-10-CM | POA: Diagnosis not present

## 2021-07-24 DIAGNOSIS — I2510 Atherosclerotic heart disease of native coronary artery without angina pectoris: Secondary | ICD-10-CM | POA: Diagnosis not present

## 2021-07-24 DIAGNOSIS — Z9181 History of falling: Secondary | ICD-10-CM | POA: Diagnosis not present

## 2021-07-24 DIAGNOSIS — E785 Hyperlipidemia, unspecified: Secondary | ICD-10-CM | POA: Diagnosis not present

## 2021-07-24 DIAGNOSIS — Z87891 Personal history of nicotine dependence: Secondary | ICD-10-CM | POA: Diagnosis not present

## 2021-07-24 DIAGNOSIS — M1990 Unspecified osteoarthritis, unspecified site: Secondary | ICD-10-CM | POA: Diagnosis not present

## 2021-07-24 DIAGNOSIS — G931 Anoxic brain damage, not elsewhere classified: Secondary | ICD-10-CM | POA: Diagnosis not present

## 2021-07-24 DIAGNOSIS — J449 Chronic obstructive pulmonary disease, unspecified: Secondary | ICD-10-CM | POA: Diagnosis not present

## 2021-07-24 DIAGNOSIS — T444X5D Adverse effect of predominantly alpha-adrenoreceptor agonists, subsequent encounter: Secondary | ICD-10-CM | POA: Diagnosis not present

## 2021-07-24 DIAGNOSIS — Z79891 Long term (current) use of opiate analgesic: Secondary | ICD-10-CM | POA: Diagnosis not present

## 2021-07-24 DIAGNOSIS — E1142 Type 2 diabetes mellitus with diabetic polyneuropathy: Secondary | ICD-10-CM | POA: Diagnosis not present

## 2021-07-24 DIAGNOSIS — I5020 Unspecified systolic (congestive) heart failure: Secondary | ICD-10-CM | POA: Diagnosis not present

## 2021-07-24 DIAGNOSIS — E1165 Type 2 diabetes mellitus with hyperglycemia: Secondary | ICD-10-CM | POA: Diagnosis not present

## 2021-07-25 ENCOUNTER — Other Ambulatory Visit (HOSPITAL_COMMUNITY): Payer: Self-pay

## 2021-07-25 MED ORDER — SENNA-DOCUSATE SODIUM 8.6-50 MG PO TABS: ORAL_TABLET | ORAL | 2 refills | Status: DC

## 2021-07-25 MED ORDER — ALPRAZOLAM 0.25 MG PO TABS
0.2500 mg | ORAL_TABLET | Freq: Three times a day (TID) | ORAL | 2 refills | Status: DC | PRN
  Filled 2021-07-25: qty 40, 30d supply, fill #0
  Filled 2021-08-26: qty 40, 30d supply, fill #1
  Filled 2021-09-23: qty 40, 30d supply, fill #2

## 2021-07-25 MED ORDER — PANTOPRAZOLE SODIUM 40 MG PO TBEC
40.0000 mg | DELAYED_RELEASE_TABLET | Freq: Every day | ORAL | 2 refills | Status: DC
  Filled 2021-07-25: qty 30, 30d supply, fill #0
  Filled 2021-08-26: qty 30, 30d supply, fill #1
  Filled 2021-09-23: qty 30, 30d supply, fill #2

## 2021-07-28 DIAGNOSIS — I469 Cardiac arrest, cause unspecified: Secondary | ICD-10-CM | POA: Diagnosis not present

## 2021-07-28 DIAGNOSIS — I4901 Ventricular fibrillation: Secondary | ICD-10-CM | POA: Diagnosis not present

## 2021-07-30 DIAGNOSIS — M1990 Unspecified osteoarthritis, unspecified site: Secondary | ICD-10-CM | POA: Diagnosis not present

## 2021-07-30 DIAGNOSIS — Z79891 Long term (current) use of opiate analgesic: Secondary | ICD-10-CM | POA: Diagnosis not present

## 2021-07-30 DIAGNOSIS — Z9181 History of falling: Secondary | ICD-10-CM | POA: Diagnosis not present

## 2021-07-30 DIAGNOSIS — I4819 Other persistent atrial fibrillation: Secondary | ICD-10-CM | POA: Diagnosis not present

## 2021-07-30 DIAGNOSIS — E1165 Type 2 diabetes mellitus with hyperglycemia: Secondary | ICD-10-CM | POA: Diagnosis not present

## 2021-07-30 DIAGNOSIS — I5020 Unspecified systolic (congestive) heart failure: Secondary | ICD-10-CM | POA: Diagnosis not present

## 2021-07-30 DIAGNOSIS — I2510 Atherosclerotic heart disease of native coronary artery without angina pectoris: Secondary | ICD-10-CM | POA: Diagnosis not present

## 2021-07-30 DIAGNOSIS — I255 Ischemic cardiomyopathy: Secondary | ICD-10-CM | POA: Diagnosis not present

## 2021-07-30 DIAGNOSIS — T8089XD Other complications following infusion, transfusion and therapeutic injection, subsequent encounter: Secondary | ICD-10-CM | POA: Diagnosis not present

## 2021-07-30 DIAGNOSIS — Z87891 Personal history of nicotine dependence: Secondary | ICD-10-CM | POA: Diagnosis not present

## 2021-07-30 DIAGNOSIS — Z79899 Other long term (current) drug therapy: Secondary | ICD-10-CM | POA: Diagnosis not present

## 2021-07-30 DIAGNOSIS — G931 Anoxic brain damage, not elsewhere classified: Secondary | ICD-10-CM | POA: Diagnosis not present

## 2021-07-30 DIAGNOSIS — I110 Hypertensive heart disease with heart failure: Secondary | ICD-10-CM | POA: Diagnosis not present

## 2021-07-30 DIAGNOSIS — Z950 Presence of cardiac pacemaker: Secondary | ICD-10-CM | POA: Diagnosis not present

## 2021-07-30 DIAGNOSIS — J449 Chronic obstructive pulmonary disease, unspecified: Secondary | ICD-10-CM | POA: Diagnosis not present

## 2021-07-30 DIAGNOSIS — Z8674 Personal history of sudden cardiac arrest: Secondary | ICD-10-CM | POA: Diagnosis not present

## 2021-07-30 DIAGNOSIS — E785 Hyperlipidemia, unspecified: Secondary | ICD-10-CM | POA: Diagnosis not present

## 2021-07-30 DIAGNOSIS — E1142 Type 2 diabetes mellitus with diabetic polyneuropathy: Secondary | ICD-10-CM | POA: Diagnosis not present

## 2021-07-30 DIAGNOSIS — Z951 Presence of aortocoronary bypass graft: Secondary | ICD-10-CM | POA: Diagnosis not present

## 2021-07-30 DIAGNOSIS — T444X5D Adverse effect of predominantly alpha-adrenoreceptor agonists, subsequent encounter: Secondary | ICD-10-CM | POA: Diagnosis not present

## 2021-07-31 DIAGNOSIS — M1990 Unspecified osteoarthritis, unspecified site: Secondary | ICD-10-CM | POA: Diagnosis not present

## 2021-07-31 DIAGNOSIS — Z79899 Other long term (current) drug therapy: Secondary | ICD-10-CM | POA: Diagnosis not present

## 2021-07-31 DIAGNOSIS — G931 Anoxic brain damage, not elsewhere classified: Secondary | ICD-10-CM | POA: Diagnosis not present

## 2021-07-31 DIAGNOSIS — Z9181 History of falling: Secondary | ICD-10-CM | POA: Diagnosis not present

## 2021-07-31 DIAGNOSIS — E1142 Type 2 diabetes mellitus with diabetic polyneuropathy: Secondary | ICD-10-CM | POA: Diagnosis not present

## 2021-07-31 DIAGNOSIS — T444X5D Adverse effect of predominantly alpha-adrenoreceptor agonists, subsequent encounter: Secondary | ICD-10-CM | POA: Diagnosis not present

## 2021-07-31 DIAGNOSIS — I5020 Unspecified systolic (congestive) heart failure: Secondary | ICD-10-CM | POA: Diagnosis not present

## 2021-07-31 DIAGNOSIS — I110 Hypertensive heart disease with heart failure: Secondary | ICD-10-CM | POA: Diagnosis not present

## 2021-07-31 DIAGNOSIS — Z951 Presence of aortocoronary bypass graft: Secondary | ICD-10-CM | POA: Diagnosis not present

## 2021-07-31 DIAGNOSIS — I4819 Other persistent atrial fibrillation: Secondary | ICD-10-CM | POA: Diagnosis not present

## 2021-07-31 DIAGNOSIS — T8089XD Other complications following infusion, transfusion and therapeutic injection, subsequent encounter: Secondary | ICD-10-CM | POA: Diagnosis not present

## 2021-07-31 DIAGNOSIS — Z8674 Personal history of sudden cardiac arrest: Secondary | ICD-10-CM | POA: Diagnosis not present

## 2021-07-31 DIAGNOSIS — Z950 Presence of cardiac pacemaker: Secondary | ICD-10-CM | POA: Diagnosis not present

## 2021-07-31 DIAGNOSIS — J449 Chronic obstructive pulmonary disease, unspecified: Secondary | ICD-10-CM | POA: Diagnosis not present

## 2021-07-31 DIAGNOSIS — E1165 Type 2 diabetes mellitus with hyperglycemia: Secondary | ICD-10-CM | POA: Diagnosis not present

## 2021-07-31 DIAGNOSIS — I2510 Atherosclerotic heart disease of native coronary artery without angina pectoris: Secondary | ICD-10-CM | POA: Diagnosis not present

## 2021-07-31 DIAGNOSIS — Z79891 Long term (current) use of opiate analgesic: Secondary | ICD-10-CM | POA: Diagnosis not present

## 2021-07-31 DIAGNOSIS — I255 Ischemic cardiomyopathy: Secondary | ICD-10-CM | POA: Diagnosis not present

## 2021-07-31 DIAGNOSIS — E785 Hyperlipidemia, unspecified: Secondary | ICD-10-CM | POA: Diagnosis not present

## 2021-07-31 DIAGNOSIS — Z87891 Personal history of nicotine dependence: Secondary | ICD-10-CM | POA: Diagnosis not present

## 2021-08-01 DIAGNOSIS — E785 Hyperlipidemia, unspecified: Secondary | ICD-10-CM | POA: Diagnosis not present

## 2021-08-01 DIAGNOSIS — T8089XD Other complications following infusion, transfusion and therapeutic injection, subsequent encounter: Secondary | ICD-10-CM | POA: Diagnosis not present

## 2021-08-01 DIAGNOSIS — J449 Chronic obstructive pulmonary disease, unspecified: Secondary | ICD-10-CM | POA: Diagnosis not present

## 2021-08-01 DIAGNOSIS — E1165 Type 2 diabetes mellitus with hyperglycemia: Secondary | ICD-10-CM | POA: Diagnosis not present

## 2021-08-01 DIAGNOSIS — I2510 Atherosclerotic heart disease of native coronary artery without angina pectoris: Secondary | ICD-10-CM | POA: Diagnosis not present

## 2021-08-01 DIAGNOSIS — G931 Anoxic brain damage, not elsewhere classified: Secondary | ICD-10-CM | POA: Diagnosis not present

## 2021-08-01 DIAGNOSIS — Z951 Presence of aortocoronary bypass graft: Secondary | ICD-10-CM | POA: Diagnosis not present

## 2021-08-01 DIAGNOSIS — I5020 Unspecified systolic (congestive) heart failure: Secondary | ICD-10-CM | POA: Diagnosis not present

## 2021-08-01 DIAGNOSIS — I4819 Other persistent atrial fibrillation: Secondary | ICD-10-CM | POA: Diagnosis not present

## 2021-08-01 DIAGNOSIS — Z87891 Personal history of nicotine dependence: Secondary | ICD-10-CM | POA: Diagnosis not present

## 2021-08-01 DIAGNOSIS — T444X5D Adverse effect of predominantly alpha-adrenoreceptor agonists, subsequent encounter: Secondary | ICD-10-CM | POA: Diagnosis not present

## 2021-08-01 DIAGNOSIS — E1142 Type 2 diabetes mellitus with diabetic polyneuropathy: Secondary | ICD-10-CM | POA: Diagnosis not present

## 2021-08-01 DIAGNOSIS — I110 Hypertensive heart disease with heart failure: Secondary | ICD-10-CM | POA: Diagnosis not present

## 2021-08-01 DIAGNOSIS — Z8674 Personal history of sudden cardiac arrest: Secondary | ICD-10-CM | POA: Diagnosis not present

## 2021-08-01 DIAGNOSIS — Z79891 Long term (current) use of opiate analgesic: Secondary | ICD-10-CM | POA: Diagnosis not present

## 2021-08-01 DIAGNOSIS — Z950 Presence of cardiac pacemaker: Secondary | ICD-10-CM | POA: Diagnosis not present

## 2021-08-01 DIAGNOSIS — Z9181 History of falling: Secondary | ICD-10-CM | POA: Diagnosis not present

## 2021-08-01 DIAGNOSIS — M1990 Unspecified osteoarthritis, unspecified site: Secondary | ICD-10-CM | POA: Diagnosis not present

## 2021-08-01 DIAGNOSIS — Z79899 Other long term (current) drug therapy: Secondary | ICD-10-CM | POA: Diagnosis not present

## 2021-08-01 DIAGNOSIS — I255 Ischemic cardiomyopathy: Secondary | ICD-10-CM | POA: Diagnosis not present

## 2021-08-03 ENCOUNTER — Encounter (HOSPITAL_BASED_OUTPATIENT_CLINIC_OR_DEPARTMENT_OTHER): Payer: Medicare Other | Admitting: Cardiovascular Disease

## 2021-08-04 ENCOUNTER — Telehealth: Payer: Self-pay | Admitting: *Deleted

## 2021-08-04 NOTE — Telephone Encounter (Signed)
Faxed order to Ross [name] on (07/22/21 and 07/23/21) for medical supplies for the patient.     Supplies:Roll Gauze (Kerlix)                Gauze (4x4)                Ace Wrap (4 inch)  Change dressing every other day.   Confirmation received and copy was scanned into the chart.//AB/CMA

## 2021-08-04 NOTE — Telephone Encounter (Signed)
Received Order Status Notification from [facility] on (07/23/21).  Stating:The order has been shipped to the patient today.  [facility] verified the patient's coverage criteria and provided service to your patient with products that maximize the patient's benefits and limit out of pocket costs.//AB/CMA

## 2021-08-04 NOTE — Telephone Encounter (Signed)
Called and spoke with Levada Dy with [facility] on (07/23/21) regarding the message below.  She stated that the patient refused nursing service, and the patient's (wife) will be taking care of the wound care changes.  She stated that the patient will continue with PT and Speech Therapy.//AB/CMA

## 2021-08-05 ENCOUNTER — Other Ambulatory Visit: Payer: Self-pay

## 2021-08-05 ENCOUNTER — Ambulatory Visit (INDEPENDENT_AMBULATORY_CARE_PROVIDER_SITE_OTHER): Payer: Medicare Other | Admitting: Plastic Surgery

## 2021-08-05 ENCOUNTER — Other Ambulatory Visit (HOSPITAL_COMMUNITY): Payer: Self-pay

## 2021-08-05 ENCOUNTER — Encounter: Payer: Self-pay | Admitting: Plastic Surgery

## 2021-08-05 DIAGNOSIS — I96 Gangrene, not elsewhere classified: Secondary | ICD-10-CM

## 2021-08-05 MED ORDER — GABAPENTIN 300 MG PO CAPS
300.0000 mg | ORAL_CAPSULE | Freq: Every day | ORAL | 2 refills | Status: DC
  Filled 2021-08-26: qty 30, 30d supply, fill #0
  Filled 2021-09-23: qty 30, 30d supply, fill #1
  Filled 2021-10-22: qty 30, 30d supply, fill #2

## 2021-08-05 NOTE — Progress Notes (Signed)
   Subjective:    Patient ID: Ross [name], male    DOB: Feb 23, 1956, 65 y.o.   MRN: [id number]  The patient is a 65 year old male here for follow-up on his left leg wound and left arm.  His arm is just about healed and it is only 1.5 x 1.5 cm.  No sign of infection.  He complains of some pain in the wrist area.  He says it feels better when he rubs it.  The leg is about half the size that it was the last visit.  The area is healing very nicely.  There is a little bit of necrotic tissue but too painful to try to excise today.  He is using Santyl on that seems to have helped quite nicely.  He still does not want to go to the OR.     Review of Systems  Constitutional: Negative.   Eyes: Negative.   Respiratory: Negative.    Cardiovascular: Negative.   Gastrointestinal: Negative.   Endocrine: Negative.   Genitourinary: Negative.       Objective:   Physical Exam Constitutional:      Appearance: Normal appearance.  Cardiovascular:     Rate and Rhythm: Normal rate.     Pulses: Normal pulses.  Pulmonary:     Effort: Pulmonary effort is normal.  Skin:    Capillary Refill: Capillary refill takes less than 2 seconds.     Coloration: Skin is not jaundiced.     Findings: Bruising, erythema and lesion present.  Neurological:     Mental Status: He is alert and oriented to person, place, and time. Mental status is at baseline.  Psychiatric:        Mood and Affect: Mood normal.        Thought Content: Thought content normal.        Assessment & Plan:     ICD-10-CM   1. Skin necrosis ([place])  I96     I recommend continuing with the Santyl on his left leg every other day and shower every other day.  Vaseline to the left arm. Pictures were obtained of the patient and placed in the chart with the patient's or guardian's permission.

## 2021-08-06 DIAGNOSIS — Z950 Presence of cardiac pacemaker: Secondary | ICD-10-CM | POA: Diagnosis not present

## 2021-08-06 DIAGNOSIS — Z79899 Other long term (current) drug therapy: Secondary | ICD-10-CM | POA: Diagnosis not present

## 2021-08-06 DIAGNOSIS — E1165 Type 2 diabetes mellitus with hyperglycemia: Secondary | ICD-10-CM | POA: Diagnosis not present

## 2021-08-06 DIAGNOSIS — I5020 Unspecified systolic (congestive) heart failure: Secondary | ICD-10-CM | POA: Diagnosis not present

## 2021-08-06 DIAGNOSIS — T444X5D Adverse effect of predominantly alpha-adrenoreceptor agonists, subsequent encounter: Secondary | ICD-10-CM | POA: Diagnosis not present

## 2021-08-06 DIAGNOSIS — I4819 Other persistent atrial fibrillation: Secondary | ICD-10-CM | POA: Diagnosis not present

## 2021-08-06 DIAGNOSIS — G931 Anoxic brain damage, not elsewhere classified: Secondary | ICD-10-CM | POA: Diagnosis not present

## 2021-08-06 DIAGNOSIS — Z9181 History of falling: Secondary | ICD-10-CM | POA: Diagnosis not present

## 2021-08-06 DIAGNOSIS — I2510 Atherosclerotic heart disease of native coronary artery without angina pectoris: Secondary | ICD-10-CM | POA: Diagnosis not present

## 2021-08-06 DIAGNOSIS — E785 Hyperlipidemia, unspecified: Secondary | ICD-10-CM | POA: Diagnosis not present

## 2021-08-06 DIAGNOSIS — Z8674 Personal history of sudden cardiac arrest: Secondary | ICD-10-CM | POA: Diagnosis not present

## 2021-08-06 DIAGNOSIS — T8089XD Other complications following infusion, transfusion and therapeutic injection, subsequent encounter: Secondary | ICD-10-CM | POA: Diagnosis not present

## 2021-08-06 DIAGNOSIS — Z87891 Personal history of nicotine dependence: Secondary | ICD-10-CM | POA: Diagnosis not present

## 2021-08-06 DIAGNOSIS — I255 Ischemic cardiomyopathy: Secondary | ICD-10-CM | POA: Diagnosis not present

## 2021-08-06 DIAGNOSIS — Z951 Presence of aortocoronary bypass graft: Secondary | ICD-10-CM | POA: Diagnosis not present

## 2021-08-06 DIAGNOSIS — Z79891 Long term (current) use of opiate analgesic: Secondary | ICD-10-CM | POA: Diagnosis not present

## 2021-08-06 DIAGNOSIS — E1142 Type 2 diabetes mellitus with diabetic polyneuropathy: Secondary | ICD-10-CM | POA: Diagnosis not present

## 2021-08-06 DIAGNOSIS — M1990 Unspecified osteoarthritis, unspecified site: Secondary | ICD-10-CM | POA: Diagnosis not present

## 2021-08-06 DIAGNOSIS — I110 Hypertensive heart disease with heart failure: Secondary | ICD-10-CM | POA: Diagnosis not present

## 2021-08-06 DIAGNOSIS — J449 Chronic obstructive pulmonary disease, unspecified: Secondary | ICD-10-CM | POA: Diagnosis not present

## 2021-08-07 DIAGNOSIS — E785 Hyperlipidemia, unspecified: Secondary | ICD-10-CM | POA: Diagnosis not present

## 2021-08-07 DIAGNOSIS — G931 Anoxic brain damage, not elsewhere classified: Secondary | ICD-10-CM | POA: Diagnosis not present

## 2021-08-07 DIAGNOSIS — I2510 Atherosclerotic heart disease of native coronary artery without angina pectoris: Secondary | ICD-10-CM | POA: Diagnosis not present

## 2021-08-07 DIAGNOSIS — T8089XD Other complications following infusion, transfusion and therapeutic injection, subsequent encounter: Secondary | ICD-10-CM | POA: Diagnosis not present

## 2021-08-07 DIAGNOSIS — E1142 Type 2 diabetes mellitus with diabetic polyneuropathy: Secondary | ICD-10-CM | POA: Diagnosis not present

## 2021-08-07 DIAGNOSIS — I255 Ischemic cardiomyopathy: Secondary | ICD-10-CM | POA: Diagnosis not present

## 2021-08-07 DIAGNOSIS — I110 Hypertensive heart disease with heart failure: Secondary | ICD-10-CM | POA: Diagnosis not present

## 2021-08-07 DIAGNOSIS — Z87891 Personal history of nicotine dependence: Secondary | ICD-10-CM | POA: Diagnosis not present

## 2021-08-07 DIAGNOSIS — Z8674 Personal history of sudden cardiac arrest: Secondary | ICD-10-CM | POA: Diagnosis not present

## 2021-08-07 DIAGNOSIS — Z950 Presence of cardiac pacemaker: Secondary | ICD-10-CM | POA: Diagnosis not present

## 2021-08-07 DIAGNOSIS — Z79891 Long term (current) use of opiate analgesic: Secondary | ICD-10-CM | POA: Diagnosis not present

## 2021-08-07 DIAGNOSIS — I4819 Other persistent atrial fibrillation: Secondary | ICD-10-CM | POA: Diagnosis not present

## 2021-08-07 DIAGNOSIS — M1990 Unspecified osteoarthritis, unspecified site: Secondary | ICD-10-CM | POA: Diagnosis not present

## 2021-08-07 DIAGNOSIS — I5020 Unspecified systolic (congestive) heart failure: Secondary | ICD-10-CM | POA: Diagnosis not present

## 2021-08-07 DIAGNOSIS — J449 Chronic obstructive pulmonary disease, unspecified: Secondary | ICD-10-CM | POA: Diagnosis not present

## 2021-08-07 DIAGNOSIS — Z9181 History of falling: Secondary | ICD-10-CM | POA: Diagnosis not present

## 2021-08-07 DIAGNOSIS — T444X5D Adverse effect of predominantly alpha-adrenoreceptor agonists, subsequent encounter: Secondary | ICD-10-CM | POA: Diagnosis not present

## 2021-08-07 DIAGNOSIS — E1165 Type 2 diabetes mellitus with hyperglycemia: Secondary | ICD-10-CM | POA: Diagnosis not present

## 2021-08-07 DIAGNOSIS — Z951 Presence of aortocoronary bypass graft: Secondary | ICD-10-CM | POA: Diagnosis not present

## 2021-08-07 DIAGNOSIS — Z79899 Other long term (current) drug therapy: Secondary | ICD-10-CM | POA: Diagnosis not present

## 2021-08-08 DIAGNOSIS — T8089XD Other complications following infusion, transfusion and therapeutic injection, subsequent encounter: Secondary | ICD-10-CM | POA: Diagnosis not present

## 2021-08-08 DIAGNOSIS — M1990 Unspecified osteoarthritis, unspecified site: Secondary | ICD-10-CM | POA: Diagnosis not present

## 2021-08-08 DIAGNOSIS — Z8674 Personal history of sudden cardiac arrest: Secondary | ICD-10-CM | POA: Diagnosis not present

## 2021-08-08 DIAGNOSIS — E1165 Type 2 diabetes mellitus with hyperglycemia: Secondary | ICD-10-CM | POA: Diagnosis not present

## 2021-08-08 DIAGNOSIS — Z79899 Other long term (current) drug therapy: Secondary | ICD-10-CM | POA: Diagnosis not present

## 2021-08-08 DIAGNOSIS — I4819 Other persistent atrial fibrillation: Secondary | ICD-10-CM | POA: Diagnosis not present

## 2021-08-08 DIAGNOSIS — J449 Chronic obstructive pulmonary disease, unspecified: Secondary | ICD-10-CM | POA: Diagnosis not present

## 2021-08-08 DIAGNOSIS — Z87891 Personal history of nicotine dependence: Secondary | ICD-10-CM | POA: Diagnosis not present

## 2021-08-08 DIAGNOSIS — I110 Hypertensive heart disease with heart failure: Secondary | ICD-10-CM | POA: Diagnosis not present

## 2021-08-08 DIAGNOSIS — T444X5D Adverse effect of predominantly alpha-adrenoreceptor agonists, subsequent encounter: Secondary | ICD-10-CM | POA: Diagnosis not present

## 2021-08-08 DIAGNOSIS — I5020 Unspecified systolic (congestive) heart failure: Secondary | ICD-10-CM | POA: Diagnosis not present

## 2021-08-08 DIAGNOSIS — E785 Hyperlipidemia, unspecified: Secondary | ICD-10-CM | POA: Diagnosis not present

## 2021-08-08 DIAGNOSIS — Z950 Presence of cardiac pacemaker: Secondary | ICD-10-CM | POA: Diagnosis not present

## 2021-08-08 DIAGNOSIS — G931 Anoxic brain damage, not elsewhere classified: Secondary | ICD-10-CM | POA: Diagnosis not present

## 2021-08-08 DIAGNOSIS — E1142 Type 2 diabetes mellitus with diabetic polyneuropathy: Secondary | ICD-10-CM | POA: Diagnosis not present

## 2021-08-08 DIAGNOSIS — I2510 Atherosclerotic heart disease of native coronary artery without angina pectoris: Secondary | ICD-10-CM | POA: Diagnosis not present

## 2021-08-08 DIAGNOSIS — I255 Ischemic cardiomyopathy: Secondary | ICD-10-CM | POA: Diagnosis not present

## 2021-08-08 DIAGNOSIS — Z951 Presence of aortocoronary bypass graft: Secondary | ICD-10-CM | POA: Diagnosis not present

## 2021-08-08 DIAGNOSIS — Z9181 History of falling: Secondary | ICD-10-CM | POA: Diagnosis not present

## 2021-08-08 DIAGNOSIS — Z79891 Long term (current) use of opiate analgesic: Secondary | ICD-10-CM | POA: Diagnosis not present

## 2021-08-10 DIAGNOSIS — J449 Chronic obstructive pulmonary disease, unspecified: Secondary | ICD-10-CM | POA: Diagnosis not present

## 2021-08-10 DIAGNOSIS — E1140 Type 2 diabetes mellitus with diabetic neuropathy, unspecified: Secondary | ICD-10-CM | POA: Diagnosis not present

## 2021-08-10 DIAGNOSIS — I2510 Atherosclerotic heart disease of native coronary artery without angina pectoris: Secondary | ICD-10-CM | POA: Diagnosis not present

## 2021-08-10 DIAGNOSIS — I4891 Unspecified atrial fibrillation: Secondary | ICD-10-CM | POA: Diagnosis not present

## 2021-08-10 DIAGNOSIS — E7800 Pure hypercholesterolemia, unspecified: Secondary | ICD-10-CM | POA: Diagnosis not present

## 2021-08-10 DIAGNOSIS — I10 Essential (primary) hypertension: Secondary | ICD-10-CM | POA: Diagnosis not present

## 2021-08-10 DIAGNOSIS — M170 Bilateral primary osteoarthritis of knee: Secondary | ICD-10-CM | POA: Diagnosis not present

## 2021-08-10 DIAGNOSIS — E1159 Type 2 diabetes mellitus with other circulatory complications: Secondary | ICD-10-CM | POA: Diagnosis not present

## 2021-08-10 DIAGNOSIS — I119 Hypertensive heart disease without heart failure: Secondary | ICD-10-CM | POA: Diagnosis not present

## 2021-08-10 DIAGNOSIS — I5042 Chronic combined systolic (congestive) and diastolic (congestive) heart failure: Secondary | ICD-10-CM | POA: Diagnosis not present
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
## 2021-08-14 ENCOUNTER — Telehealth: Payer: Self-pay | Admitting: *Deleted

## 2021-08-14 DIAGNOSIS — G931 Anoxic brain damage, not elsewhere classified: Secondary | ICD-10-CM | POA: Diagnosis not present

## 2021-08-14 DIAGNOSIS — I4819 Other persistent atrial fibrillation: Secondary | ICD-10-CM | POA: Diagnosis not present

## 2021-08-14 DIAGNOSIS — Z87891 Personal history of nicotine dependence: Secondary | ICD-10-CM | POA: Diagnosis not present

## 2021-08-14 DIAGNOSIS — E1142 Type 2 diabetes mellitus with diabetic polyneuropathy: Secondary | ICD-10-CM | POA: Diagnosis not present

## 2021-08-14 DIAGNOSIS — Z79899 Other long term (current) drug therapy: Secondary | ICD-10-CM | POA: Diagnosis not present

## 2021-08-14 DIAGNOSIS — E1165 Type 2 diabetes mellitus with hyperglycemia: Secondary | ICD-10-CM | POA: Diagnosis not present

## 2021-08-14 DIAGNOSIS — T444X5D Adverse effect of predominantly alpha-adrenoreceptor agonists, subsequent encounter: Secondary | ICD-10-CM | POA: Diagnosis not present

## 2021-08-14 DIAGNOSIS — Z79891 Long term (current) use of opiate analgesic: Secondary | ICD-10-CM | POA: Diagnosis not present

## 2021-08-14 DIAGNOSIS — M1990 Unspecified osteoarthritis, unspecified site: Secondary | ICD-10-CM | POA: Diagnosis not present

## 2021-08-14 DIAGNOSIS — T8089XD Other complications following infusion, transfusion and therapeutic injection, subsequent encounter: Secondary | ICD-10-CM | POA: Diagnosis not present

## 2021-08-14 DIAGNOSIS — E785 Hyperlipidemia, unspecified: Secondary | ICD-10-CM | POA: Diagnosis not present

## 2021-08-14 DIAGNOSIS — Z950 Presence of cardiac pacemaker: Secondary | ICD-10-CM | POA: Diagnosis not present

## 2021-08-14 DIAGNOSIS — I2510 Atherosclerotic heart disease of native coronary artery without angina pectoris: Secondary | ICD-10-CM | POA: Diagnosis not present

## 2021-08-14 DIAGNOSIS — Z951 Presence of aortocoronary bypass graft: Secondary | ICD-10-CM | POA: Diagnosis not present

## 2021-08-14 DIAGNOSIS — I110 Hypertensive heart disease with heart failure: Secondary | ICD-10-CM | POA: Diagnosis not present

## 2021-08-14 DIAGNOSIS — Z8674 Personal history of sudden cardiac arrest: Secondary | ICD-10-CM | POA: Diagnosis not present

## 2021-08-14 DIAGNOSIS — I255 Ischemic cardiomyopathy: Secondary | ICD-10-CM | POA: Diagnosis not present

## 2021-08-14 DIAGNOSIS — I5020 Unspecified systolic (congestive) heart failure: Secondary | ICD-10-CM | POA: Diagnosis not present

## 2021-08-14 DIAGNOSIS — J449 Chronic obstructive pulmonary disease, unspecified: Secondary | ICD-10-CM | POA: Diagnosis not present

## 2021-08-14 DIAGNOSIS — Z9181 History of falling: Secondary | ICD-10-CM | POA: Diagnosis not present

## 2021-08-14 NOTE — Telephone Encounter (Signed)
Ross [name] [facility] called to report they are planning for discharge of [name] next week, a week early because he has met his goals.

## 2021-08-15 ENCOUNTER — Other Ambulatory Visit: Payer: Self-pay

## 2021-08-15 ENCOUNTER — Ambulatory Visit (INDEPENDENT_AMBULATORY_CARE_PROVIDER_SITE_OTHER): Payer: Medicare Other | Admitting: Physician Assistant

## 2021-08-15 DIAGNOSIS — I96 Gangrene, not elsewhere classified: Secondary | ICD-10-CM

## 2021-08-20 DIAGNOSIS — G931 Anoxic brain damage, not elsewhere classified: Secondary | ICD-10-CM | POA: Diagnosis not present

## 2021-08-20 DIAGNOSIS — Z79891 Long term (current) use of opiate analgesic: Secondary | ICD-10-CM | POA: Diagnosis not present

## 2021-08-20 DIAGNOSIS — E1165 Type 2 diabetes mellitus with hyperglycemia: Secondary | ICD-10-CM | POA: Diagnosis not present

## 2021-08-20 DIAGNOSIS — Z87891 Personal history of nicotine dependence: Secondary | ICD-10-CM | POA: Diagnosis not present

## 2021-08-20 DIAGNOSIS — Z79899 Other long term (current) drug therapy: Secondary | ICD-10-CM | POA: Diagnosis not present

## 2021-08-20 DIAGNOSIS — E1142 Type 2 diabetes mellitus with diabetic polyneuropathy: Secondary | ICD-10-CM | POA: Diagnosis not present

## 2021-08-20 DIAGNOSIS — I2510 Atherosclerotic heart disease of native coronary artery without angina pectoris: Secondary | ICD-10-CM | POA: Diagnosis not present

## 2021-08-20 DIAGNOSIS — T8089XD Other complications following infusion, transfusion and therapeutic injection, subsequent encounter: Secondary | ICD-10-CM | POA: Diagnosis not present

## 2021-08-20 DIAGNOSIS — I5020 Unspecified systolic (congestive) heart failure: Secondary | ICD-10-CM | POA: Diagnosis not present

## 2021-08-20 DIAGNOSIS — J449 Chronic obstructive pulmonary disease, unspecified: Secondary | ICD-10-CM | POA: Diagnosis not present

## 2021-08-20 DIAGNOSIS — E785 Hyperlipidemia, unspecified: Secondary | ICD-10-CM | POA: Diagnosis not present

## 2021-08-20 DIAGNOSIS — Z9181 History of falling: Secondary | ICD-10-CM | POA: Diagnosis not present

## 2021-08-20 DIAGNOSIS — M1990 Unspecified osteoarthritis, unspecified site: Secondary | ICD-10-CM | POA: Diagnosis not present

## 2021-08-20 DIAGNOSIS — I110 Hypertensive heart disease with heart failure: Secondary | ICD-10-CM | POA: Diagnosis not present

## 2021-08-20 DIAGNOSIS — I4819 Other persistent atrial fibrillation: Secondary | ICD-10-CM | POA: Diagnosis not present

## 2021-08-20 DIAGNOSIS — Z950 Presence of cardiac pacemaker: Secondary | ICD-10-CM | POA: Diagnosis not present

## 2021-08-20 DIAGNOSIS — I255 Ischemic cardiomyopathy: Secondary | ICD-10-CM | POA: Diagnosis not present

## 2021-08-20 DIAGNOSIS — Z951 Presence of aortocoronary bypass graft: Secondary | ICD-10-CM | POA: Diagnosis not present

## 2021-08-20 DIAGNOSIS — T444X5D Adverse effect of predominantly alpha-adrenoreceptor agonists, subsequent encounter: Secondary | ICD-10-CM | POA: Diagnosis not present

## 2021-08-20 DIAGNOSIS — Z8674 Personal history of sudden cardiac arrest: Secondary | ICD-10-CM | POA: Diagnosis not present

## 2021-08-21 ENCOUNTER — Encounter: Payer: Medicare Other | Admitting: Cardiology

## 2021-08-21 ENCOUNTER — Other Ambulatory Visit (HOSPITAL_COMMUNITY): Payer: Self-pay

## 2021-08-21 DIAGNOSIS — I2510 Atherosclerotic heart disease of native coronary artery without angina pectoris: Secondary | ICD-10-CM | POA: Diagnosis not present

## 2021-08-21 DIAGNOSIS — Z951 Presence of aortocoronary bypass graft: Secondary | ICD-10-CM | POA: Diagnosis not present

## 2021-08-21 DIAGNOSIS — Z8674 Personal history of sudden cardiac arrest: Secondary | ICD-10-CM | POA: Diagnosis not present

## 2021-08-21 DIAGNOSIS — T444X5D Adverse effect of predominantly alpha-adrenoreceptor agonists, subsequent encounter: Secondary | ICD-10-CM | POA: Diagnosis not present

## 2021-08-21 DIAGNOSIS — T8089XD Other complications following infusion, transfusion and therapeutic injection, subsequent encounter: Secondary | ICD-10-CM | POA: Diagnosis not present

## 2021-08-21 DIAGNOSIS — Z87891 Personal history of nicotine dependence: Secondary | ICD-10-CM | POA: Diagnosis not present

## 2021-08-21 DIAGNOSIS — M1990 Unspecified osteoarthritis, unspecified site: Secondary | ICD-10-CM | POA: Diagnosis not present

## 2021-08-21 DIAGNOSIS — Z79899 Other long term (current) drug therapy: Secondary | ICD-10-CM | POA: Diagnosis not present

## 2021-08-21 DIAGNOSIS — Z950 Presence of cardiac pacemaker: Secondary | ICD-10-CM | POA: Diagnosis not present

## 2021-08-21 DIAGNOSIS — I4819 Other persistent atrial fibrillation: Secondary | ICD-10-CM | POA: Diagnosis not present

## 2021-08-21 DIAGNOSIS — G931 Anoxic brain damage, not elsewhere classified: Secondary | ICD-10-CM | POA: Diagnosis not present

## 2021-08-21 DIAGNOSIS — I255 Ischemic cardiomyopathy: Secondary | ICD-10-CM | POA: Diagnosis not present

## 2021-08-21 DIAGNOSIS — I110 Hypertensive heart disease with heart failure: Secondary | ICD-10-CM | POA: Diagnosis not present

## 2021-08-21 DIAGNOSIS — E785 Hyperlipidemia, unspecified: Secondary | ICD-10-CM | POA: Diagnosis not present

## 2021-08-21 DIAGNOSIS — Z79891 Long term (current) use of opiate analgesic: Secondary | ICD-10-CM | POA: Diagnosis not present

## 2021-08-21 DIAGNOSIS — J449 Chronic obstructive pulmonary disease, unspecified: Secondary | ICD-10-CM | POA: Diagnosis not present

## 2021-08-21 DIAGNOSIS — E1142 Type 2 diabetes mellitus with diabetic polyneuropathy: Secondary | ICD-10-CM | POA: Diagnosis not present

## 2021-08-21 DIAGNOSIS — E1165 Type 2 diabetes mellitus with hyperglycemia: Secondary | ICD-10-CM | POA: Diagnosis not present

## 2021-08-21 DIAGNOSIS — Z9181 History of falling: Secondary | ICD-10-CM | POA: Diagnosis not present

## 2021-08-21 DIAGNOSIS — I5020 Unspecified systolic (congestive) heart failure: Secondary | ICD-10-CM | POA: Diagnosis not present

## 2021-08-26 ENCOUNTER — Other Ambulatory Visit (HOSPITAL_COMMUNITY): Payer: Self-pay

## 2021-08-27 DIAGNOSIS — I255 Ischemic cardiomyopathy: Secondary | ICD-10-CM | POA: Diagnosis not present

## 2021-08-27 DIAGNOSIS — Z951 Presence of aortocoronary bypass graft: Secondary | ICD-10-CM | POA: Diagnosis not present

## 2021-08-27 DIAGNOSIS — Z79891 Long term (current) use of opiate analgesic: Secondary | ICD-10-CM | POA: Diagnosis not present

## 2021-08-27 DIAGNOSIS — T444X5D Adverse effect of predominantly alpha-adrenoreceptor agonists, subsequent encounter: Secondary | ICD-10-CM | POA: Diagnosis not present

## 2021-08-27 DIAGNOSIS — I110 Hypertensive heart disease with heart failure: Secondary | ICD-10-CM | POA: Diagnosis not present

## 2021-08-27 DIAGNOSIS — E1165 Type 2 diabetes mellitus with hyperglycemia: Secondary | ICD-10-CM | POA: Diagnosis not present

## 2021-08-27 DIAGNOSIS — Z8674 Personal history of sudden cardiac arrest: Secondary | ICD-10-CM | POA: Diagnosis not present

## 2021-08-27 DIAGNOSIS — E1142 Type 2 diabetes mellitus with diabetic polyneuropathy: Secondary | ICD-10-CM | POA: Diagnosis not present

## 2021-08-27 DIAGNOSIS — E785 Hyperlipidemia, unspecified: Secondary | ICD-10-CM | POA: Diagnosis not present

## 2021-08-27 DIAGNOSIS — I4819 Other persistent atrial fibrillation: Secondary | ICD-10-CM | POA: Diagnosis not present

## 2021-08-27 DIAGNOSIS — J449 Chronic obstructive pulmonary disease, unspecified: Secondary | ICD-10-CM | POA: Diagnosis not present

## 2021-08-27 DIAGNOSIS — I2510 Atherosclerotic heart disease of native coronary artery without angina pectoris: Secondary | ICD-10-CM | POA: Diagnosis not present

## 2021-08-27 DIAGNOSIS — M1990 Unspecified osteoarthritis, unspecified site: Secondary | ICD-10-CM | POA: Diagnosis not present

## 2021-08-27 DIAGNOSIS — I5020 Unspecified systolic (congestive) heart failure: Secondary | ICD-10-CM | POA: Diagnosis not present

## 2021-08-27 DIAGNOSIS — Z87891 Personal history of nicotine dependence: Secondary | ICD-10-CM | POA: Diagnosis not present

## 2021-08-27 DIAGNOSIS — G931 Anoxic brain damage, not elsewhere classified: Secondary | ICD-10-CM | POA: Diagnosis not present

## 2021-08-27 DIAGNOSIS — Z950 Presence of cardiac pacemaker: Secondary | ICD-10-CM | POA: Diagnosis not present

## 2021-08-27 DIAGNOSIS — T8089XD Other complications following infusion, transfusion and therapeutic injection, subsequent encounter: Secondary | ICD-10-CM | POA: Diagnosis not present

## 2021-08-27 DIAGNOSIS — Z9181 History of falling: Secondary | ICD-10-CM | POA: Diagnosis not present

## 2021-08-27 DIAGNOSIS — Z79899 Other long term (current) drug therapy: Secondary | ICD-10-CM | POA: Diagnosis not present
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
## 2021-08-28 DIAGNOSIS — I469 Cardiac arrest, cause unspecified: Secondary | ICD-10-CM | POA: Diagnosis not present

## 2021-08-28 DIAGNOSIS — I4901 Ventricular fibrillation: Secondary | ICD-10-CM | POA: Diagnosis not present
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
## 2021-08-29 ENCOUNTER — Encounter: Payer: Self-pay | Admitting: Interventional Cardiology

## 2021-08-29 ENCOUNTER — Ambulatory Visit: Payer: Medicare Other | Admitting: Interventional Cardiology

## 2021-08-29 ENCOUNTER — Other Ambulatory Visit (HOSPITAL_COMMUNITY): Payer: Self-pay

## 2021-08-29 ENCOUNTER — Encounter: Payer: Self-pay | Admitting: Physician Assistant

## 2021-08-29 ENCOUNTER — Telehealth: Payer: Self-pay

## 2021-08-29 ENCOUNTER — Ambulatory Visit (INDEPENDENT_AMBULATORY_CARE_PROVIDER_SITE_OTHER): Payer: Medicare Other | Admitting: Physician Assistant

## 2021-08-29 ENCOUNTER — Other Ambulatory Visit: Payer: Self-pay

## 2021-08-29 VITALS — BP 114/74 | HR 62 | Ht 76.0 in | Wt 252.0 lb

## 2021-08-29 DIAGNOSIS — S81802D Unspecified open wound, left lower leg, subsequent encounter: Secondary | ICD-10-CM

## 2021-08-29 DIAGNOSIS — L97923 Non-pressure chronic ulcer of unspecified part of left lower leg with necrosis of muscle: Secondary | ICD-10-CM | POA: Diagnosis not present

## 2021-08-29 DIAGNOSIS — I4821 Permanent atrial fibrillation: Secondary | ICD-10-CM | POA: Diagnosis not present

## 2021-08-29 DIAGNOSIS — Z950 Presence of cardiac pacemaker: Secondary | ICD-10-CM

## 2021-08-29 DIAGNOSIS — I10 Essential (primary) hypertension: Secondary | ICD-10-CM

## 2021-08-29 DIAGNOSIS — I5042 Chronic combined systolic (congestive) and diastolic (congestive) heart failure: Secondary | ICD-10-CM

## 2021-08-29 DIAGNOSIS — I4901 Ventricular fibrillation: Secondary | ICD-10-CM | POA: Diagnosis not present

## 2021-08-29 DIAGNOSIS — E785 Hyperlipidemia, unspecified: Secondary | ICD-10-CM

## 2021-08-29 DIAGNOSIS — I25709 Atherosclerosis of coronary artery bypass graft(s), unspecified, with unspecified angina pectoris: Secondary | ICD-10-CM | POA: Diagnosis not present

## 2021-08-29 MED ORDER — DAPAGLIFLOZIN PROPANEDIOL 10 MG PO TABS
10.0000 mg | ORAL_TABLET | Freq: Every day | ORAL | 11 refills | Status: DC
  Filled 2021-08-29: qty 30, 30d supply, fill #0

## 2021-08-29 NOTE — Telephone Encounter (Signed)
Faxed Medical supplies to [facility]: Collagen dressing, ABD pads, kerlix, gauze, tape and saline-Change every other day.

## 2021-08-29 NOTE — Patient Instructions (Signed)
Medication Instructions:  1) START Farxiga 10mg  once daily  *If you need a refill on your cardiac medications before your next appointment, please call your pharmacy*   Lab Work: BMET in 2 weeks  If you have labs (blood work) drawn today and your tests are completely normal, you will receive your results only by: [place] (if you have MyChart) OR A paper copy in the mail If you have any lab test that is abnormal or we need to change your treatment, we will call you to review the results.   Testing/Procedures: None   Follow-Up: At [facility], you and your health needs are our priority.  As part of our continuing mission to provide you with exceptional heart care, we have created designated Provider Care Teams.  These Care Teams include your primary Cardiologist (physician) and Advanced Practice Providers (APPs -  Physician Assistants and Nurse Practitioners) who all work together to provide you with the care you need, when you need it.  We recommend signing up for the patient portal called "MyChart".  Sign up information is provided on this After Visit Summary.  MyChart is used to connect with patients for Virtual Visits (Telemedicine).  Patients are able to view lab/test results, encounter notes, upcoming appointments, etc.  Non-urgent messages can be sent to your provider as well.   To learn more about what you can do with MyChart, go to [email or web address].    Your next appointment:   3 month(s)  The format for your next appointment:   In Person  Provider:   You may see Sinclair Grooms, MD or one of the following Advanced Practice Providers on your designated Care Team:   Cecilie Kicks, NP   Other Instructions

## 2021-09-01 ENCOUNTER — Other Ambulatory Visit (HOSPITAL_COMMUNITY): Payer: Self-pay

## 2021-09-01 ENCOUNTER — Inpatient Hospital Stay: Payer: Medicare Other | Admitting: Physical Medicine and Rehabilitation

## 2021-09-05 ENCOUNTER — Other Ambulatory Visit (HOSPITAL_COMMUNITY): Payer: Self-pay

## 2021-09-10 DIAGNOSIS — I5042 Chronic combined systolic (congestive) and diastolic (congestive) heart failure: Secondary | ICD-10-CM | POA: Diagnosis not present

## 2021-09-10 DIAGNOSIS — E1159 Type 2 diabetes mellitus with other circulatory complications: Secondary | ICD-10-CM | POA: Diagnosis not present

## 2021-09-10 DIAGNOSIS — I2510 Atherosclerotic heart disease of native coronary artery without angina pectoris: Secondary | ICD-10-CM | POA: Diagnosis not present

## 2021-09-10 DIAGNOSIS — J449 Chronic obstructive pulmonary disease, unspecified: Secondary | ICD-10-CM | POA: Diagnosis not present

## 2021-09-10 DIAGNOSIS — E7800 Pure hypercholesterolemia, unspecified: Secondary | ICD-10-CM | POA: Diagnosis not present

## 2021-09-10 DIAGNOSIS — E1140 Type 2 diabetes mellitus with diabetic neuropathy, unspecified: Secondary | ICD-10-CM | POA: Diagnosis not present

## 2021-09-10 DIAGNOSIS — I119 Hypertensive heart disease without heart failure: Secondary | ICD-10-CM | POA: Diagnosis not present

## 2021-09-10 DIAGNOSIS — I4891 Unspecified atrial fibrillation: Secondary | ICD-10-CM | POA: Diagnosis not present

## 2021-09-10 DIAGNOSIS — I10 Essential (primary) hypertension: Secondary | ICD-10-CM | POA: Diagnosis not present

## 2021-09-15 ENCOUNTER — Other Ambulatory Visit (HOSPITAL_COMMUNITY): Payer: Self-pay
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
## 2021-09-16 ENCOUNTER — Other Ambulatory Visit (HOSPITAL_COMMUNITY): Payer: Self-pay

## 2021-09-16 ENCOUNTER — Other Ambulatory Visit: Payer: Medicare Other

## 2021-09-16 ENCOUNTER — Ambulatory Visit (INDEPENDENT_AMBULATORY_CARE_PROVIDER_SITE_OTHER): Payer: Medicare Other | Admitting: Physician Assistant

## 2021-09-16 ENCOUNTER — Other Ambulatory Visit: Payer: Self-pay

## 2021-09-16 ENCOUNTER — Ambulatory Visit (INDEPENDENT_AMBULATORY_CARE_PROVIDER_SITE_OTHER): Payer: Medicare Other

## 2021-09-16 DIAGNOSIS — I495 Sick sinus syndrome: Secondary | ICD-10-CM | POA: Diagnosis not present

## 2021-09-16 DIAGNOSIS — S81802D Unspecified open wound, left lower leg, subsequent encounter: Secondary | ICD-10-CM | POA: Diagnosis not present

## 2021-09-16 DIAGNOSIS — I5042 Chronic combined systolic (congestive) and diastolic (congestive) heart failure: Secondary | ICD-10-CM | POA: Diagnosis not present

## 2021-09-16 LAB — BASIC METABOLIC PANEL
BUN/Creatinine Ratio: 16 (ref 10–24)
BUN: 25 mg/dL (ref 8–27)
CO2: 23 mmol/L (ref 20–29)
Calcium: 11.2 mg/dL — ABNORMAL HIGH (ref 8.6–10.2)
Chloride: 107 mmol/L — ABNORMAL HIGH (ref 96–106)
Creatinine, Ser: 1.52 mg/dL — ABNORMAL HIGH (ref 0.76–1.27)
Glucose: 102 mg/dL — ABNORMAL HIGH (ref 70–99)
Potassium: 4.6 mmol/L (ref 3.5–5.2)
Sodium: 143 mmol/L (ref 134–144)
eGFR: 50 mL/min/{1.73_m2} — ABNORMAL LOW (ref >59)

## 2021-09-17 ENCOUNTER — Other Ambulatory Visit (HOSPITAL_COMMUNITY): Payer: Self-pay

## 2021-09-17 ENCOUNTER — Other Ambulatory Visit: Payer: Self-pay | Admitting: *Deleted

## 2021-09-17 ENCOUNTER — Telehealth: Payer: Self-pay | Admitting: Interventional Cardiology

## 2021-09-17 LAB — CUP PACEART REMOTE DEVICE CHECK
Battery Remaining Longevity: 62 mo
Battery Voltage: 3 V
Brady Statistic RV Percent Paced: 99.67 %
Date Time Interrogation Session: 20220928132444
Implantable Lead Implant Date: 20170126
Implantable Lead Location: 753860
Implantable Lead Model: 5076
Implantable Pulse Generator Implant Date: 20170126
Lead Channel Impedance Value: 342 Ohm
Lead Channel Impedance Value: 380 Ohm
Lead Channel Pacing Threshold Amplitude: 1.25 V
Lead Channel Pacing Threshold Pulse Width: 0.4 ms
Lead Channel Sensing Intrinsic Amplitude: 21.125 mV
Lead Channel Sensing Intrinsic Amplitude: 21.125 mV
Lead Channel Setting Pacing Amplitude: 2.5 V
Lead Channel Setting Pacing Pulse Width: 0.4 ms
Lead Channel Setting Sensing Sensitivity: 2 mV

## 2021-09-17 MED ORDER — ISOSORBIDE DINITRATE 20 MG PO TABS
20.0000 mg | ORAL_TABLET | Freq: Three times a day (TID) | ORAL | 1 refills | Status: DC
  Filled 2021-09-17 – 2021-09-23 (×3): qty 270, 90d supply, fill #0
  Filled 2021-09-23: qty 90, 30d supply, fill #0
  Filled 2021-10-22: qty 90, 30d supply, fill #1
  Filled 2021-11-20: qty 90, 30d supply, fill #2
  Filled 2021-12-16: qty 90, 30d supply, fill #3
  Filled 2022-01-15: qty 90, 30d supply, fill #4
  Filled 2022-02-16: qty 90, 30d supply, fill #5

## 2021-09-17 MED ORDER — HYDRALAZINE HCL 50 MG PO TABS
50.0000 mg | ORAL_TABLET | Freq: Three times a day (TID) | ORAL | 1 refills | Status: DC
  Filled 2021-09-17: qty 270, 90d supply, fill #0
  Filled 2021-09-18: qty 90, 30d supply, fill #0
  Filled 2021-09-23: qty 270, 90d supply, fill #0
  Filled 2021-09-23: qty 90, 30d supply, fill #0
  Filled 2021-10-22: qty 90, 30d supply, fill #1
  Filled 2021-11-20: qty 90, 30d supply, fill #2
  Filled 2021-12-16: qty 90, 30d supply, fill #3
  Filled 2022-01-15: qty 90, 30d supply, fill #4
  Filled 2022-02-16: qty 90, 30d supply, fill #5

## 2021-09-17 NOTE — Telephone Encounter (Addendum)
**Note De-Identified  Obfuscation** I called the pt and we discussed him applying for assistance for his Farxiga through [facility] [place] pt asst program by calling them to apply over the phone at [phone]. He is interested as he is aware that if approved he will get it free of charge from them for the remainder of this year.  We discussed him applying for asst with Eliquis through [facility] and he is interested in applying as he is aware that if approved he will get it free of charge from them for the remainder of this year.             I gave him [employer] phone number and advised him to call them with questions about their Eliquis program and his eligibility to be approved for the program. He is aware to request that they mail him an application to his home address if it appears he is eligible and that once he receives it to complete his part, obtain required documents per [employer], and to bring all to Dr Darliss Ridgel office at [employer] [facility] [employer] [address]	[place] in [place] to drop off at the front desk and that we will take care of the provider page of his application and will fax all to [employer].  Also, I advised him that there is no assistance program/discounts available for Bidil so Dr Tamala Julian recommends that he take Isosorbide Dinitrate 20mg  TID and Hydralazine 50mg  TID.   The pt is in agreement with plan and verbalized understanding to all information given.  Per his request I have e-scribed his Isosorbide Dinitrate 20mg  TID #270 and Hydralazine 50 mg TID #270 to [facility].

## 2021-09-17 NOTE — Telephone Encounter (Signed)
Received message from Pharmacy about pt needing assistance for Eliquis, Farxiga and Bidil.  Prior auth nurse and Pharmacist added to conversation.  Prior auth nurse will work on assistance for [employer].  Pharmacist recommended splitting Bidil.  Put currently takes Bidil 20/37.5mg - 1.5 tablets TID.  Spoke with Dr. Tamala Julian and he is ok with splitting medication.  Dr. Tamala Julian would like for pt to take Isosorbide Dinitrate 20mg  TID and Hydralazine 50mg  TID.    Called pt and left message to call back.

## 2021-09-17 NOTE — Telephone Encounter (Signed)
  Pt is calling back would like to speak with [name] again about pt's assistance

## 2021-09-18 ENCOUNTER — Other Ambulatory Visit (HOSPITAL_COMMUNITY): Payer: Self-pay

## 2021-09-18 MED ORDER — DAPAGLIFLOZIN PROPANEDIOL 10 MG PO TABS: 10.0000 mg | ORAL_TABLET | Freq: Every day | ORAL | 3 refills | Status: DC

## 2021-09-18 NOTE — Telephone Encounter (Addendum)
**Note De-Identified  Obfuscation** The pt is requesting that I send his Lelon Frohlich to [facility] at [phone] as he states that he applied for and was approved for asst over the phone with [place] [facility] [place]. I have e-scribed his Farxiga 10 mg #90 with 3 refills to [facility] [facility]) to fill as he requested.  The pt had several questions about the [place] and [place] program.  I answered all that I could and advised him to contact [place] [facility] [place] for the answers that I could not answer.  He had several questions about switching from Bidil to Isosorbide and Hydralazine and I answered all and advised him to call us back if he has any furher question or concerns or if he develops any symptoms/side effects once he makes this switch.  He verbalized understanding and thanked me for calling him back.

## 2021-09-18 NOTE — Addendum Note (Signed)
**Note De-Identified Kdyn Vonbehren Obfuscation** Addended by: Dennie Fetters on: 09/18/2021 11:01 AM   Modules accepted: Orders

## 2021-09-23 ENCOUNTER — Other Ambulatory Visit (HOSPITAL_COMMUNITY): Payer: Self-pay

## 2021-09-23 NOTE — Progress Notes (Signed)
Remote pacemaker transmission.   

## 2021-09-24 ENCOUNTER — Other Ambulatory Visit (HOSPITAL_COMMUNITY): Payer: Self-pay

## 2021-09-25 ENCOUNTER — Ambulatory Visit (INDEPENDENT_AMBULATORY_CARE_PROVIDER_SITE_OTHER): Payer: Medicare Other | Admitting: Cardiology

## 2021-09-25 ENCOUNTER — Other Ambulatory Visit: Payer: Self-pay

## 2021-09-25 ENCOUNTER — Encounter: Payer: Self-pay | Admitting: Cardiology

## 2021-09-25 VITALS — BP 130/74 | HR 82 | Ht 74.0 in | Wt 248.4 lb

## 2021-09-25 DIAGNOSIS — I255 Ischemic cardiomyopathy: Secondary | ICD-10-CM | POA: Diagnosis not present

## 2021-09-25 DIAGNOSIS — I4901 Ventricular fibrillation: Secondary | ICD-10-CM | POA: Diagnosis not present

## 2021-09-25 DIAGNOSIS — I4819 Other persistent atrial fibrillation: Secondary | ICD-10-CM | POA: Diagnosis not present

## 2021-09-25 NOTE — Patient Instructions (Signed)
Medication Instructions:  Your physician recommends that you continue on your current medications as directed. Please refer to the Current Medication list given to you today.  *If you need a refill on your cardiac medications before your next appointment, please call your pharmacy*   Lab Work: None ordered. Today  If you have labs (blood work) drawn today and your tests are completely normal, you will receive your results only by: [place] (if you have MyChart) OR A paper copy in the mail If you have any lab test that is abnormal or we need to change your treatment, we will call you to review the results.   Testing/Procedures: CRT or cardiac resynchronization therapy is a treatment used to correct heart failure. When you have heart failure your heart is weakened and doesn't pump as well as it should. This therapy may help reduce symptoms and improve the quality of life.  Please see the handout/brochure given to you today to get more information of the different options of therapy.    Follow-Up: At [facility], you and your health needs are our priority.  As part of our continuing mission to provide you with exceptional heart care, we have created designated Provider Care Teams.  These Care Teams include your primary Cardiologist (physician) and Advanced Practice Providers (APPs -  Physician Assistants and Nurse Practitioners) who all work together to provide you with the care you need, when you need it.  We recommend signing up for the patient portal called "MyChart".  Sign up information is provided on this After Visit Summary.  MyChart is used to connect with patients for Virtual Visits (Telemedicine).  Patients are able to view lab/test results, encounter notes, upcoming appointments, etc.  Non-urgent messages can be sent to your provider as well.   To learn more about what you can do with MyChart, go to [email or web address].    Your next appointment:   10/24/2021 at 1:15pm  with Tommye Standard, PA-C
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
## 2021-09-27 DIAGNOSIS — I4901 Ventricular fibrillation: Secondary | ICD-10-CM | POA: Diagnosis not present

## 2021-09-27 DIAGNOSIS — I469 Cardiac arrest, cause unspecified: Secondary | ICD-10-CM | POA: Diagnosis not present

## 2021-09-29 ENCOUNTER — Ambulatory Visit (HOSPITAL_BASED_OUTPATIENT_CLINIC_OR_DEPARTMENT_OTHER): Payer: Medicare Other | Admitting: Cardiovascular Disease

## 2021-10-03 ENCOUNTER — Other Ambulatory Visit (HOSPITAL_COMMUNITY): Payer: Self-pay

## 2021-10-06 ENCOUNTER — Other Ambulatory Visit (HOSPITAL_COMMUNITY): Payer: Self-pay

## 2021-10-06 MED ORDER — ALPRAZOLAM 0.5 MG PO TABS
0.5000 mg | ORAL_TABLET | Freq: Every day | ORAL | 2 refills | Status: DC
  Filled 2021-10-06 – 2021-10-07 (×2): qty 31, 31d supply, fill #0
  Filled 2021-11-05 – 2021-11-06 (×2): qty 31, 31d supply, fill #1
  Filled 2021-12-16: qty 31, 31d supply, fill #2

## 2021-10-07 ENCOUNTER — Other Ambulatory Visit: Payer: Self-pay

## 2021-10-07 ENCOUNTER — Other Ambulatory Visit (HOSPITAL_COMMUNITY): Payer: Self-pay

## 2021-10-07 ENCOUNTER — Ambulatory Visit (INDEPENDENT_AMBULATORY_CARE_PROVIDER_SITE_OTHER): Payer: Medicare Other | Admitting: Physician Assistant

## 2021-10-07 DIAGNOSIS — S81802D Unspecified open wound, left lower leg, subsequent encounter: Secondary | ICD-10-CM
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
## 2021-10-22 ENCOUNTER — Telehealth: Payer: Self-pay | Admitting: Interventional Cardiology

## 2021-10-22 ENCOUNTER — Other Ambulatory Visit: Payer: Self-pay | Admitting: Interventional Cardiology

## 2021-10-22 ENCOUNTER — Other Ambulatory Visit (HOSPITAL_COMMUNITY): Payer: Self-pay

## 2021-10-22 MED ORDER — PANTOPRAZOLE SODIUM 40 MG PO TBEC
40.0000 mg | DELAYED_RELEASE_TABLET | Freq: Every day | ORAL | 1 refills | Status: DC
  Filled 2021-10-22: qty 30, 30d supply, fill #0
  Filled 2021-11-20: qty 30, 30d supply, fill #1

## 2021-10-22 NOTE — Telephone Encounter (Signed)
Received message from Pharmacist that pt is having issues with pt assistance and is almost out of [place].  Called pt and he states that he qualified for pt assistance but has not received any medications as of yet.  He tried to call [place] [facility] [place] and said he was on hold forever.  Provided pt with the number to [facility] (pharmacy) as well.  Pt will try to call them to see if he can get any information.  Will route to pt assistance nurse to see if there is anything else that can be done.  Pt has been provided with 2 weeks worth of samples to hold him over until this can be resolved.  He will pick them up Friday when he is here for an appt.

## 2021-10-23 ENCOUNTER — Telehealth: Payer: Self-pay | Admitting: *Deleted

## 2021-10-23 NOTE — Telephone Encounter (Signed)
Pt made aware there has been a change with planned procedure currently scheduled for 11/11. Aware I am working on alternate dates this month. Aware I will be in touch by next week. Patient verbalized understanding and agreeable to plan.

## 2021-10-23 NOTE — Telephone Encounter (Signed)
**Note De-Identified  Obfuscation** I am unaware of the pts approval in the [place] [employer] [place] pt asst program for Ross [name] other than him telling me he was approved during a phone call on 9/29. I did e-scribe the pts Farxiga RX #[id number] with 3 refills to [facility] (pharmacy for [name] [facility] [name]) on 9/29 as well.  Getting in touch with [place] is difficult at this time of year and they are making changes in their processing so getting in touch with them may require a longer hold/wait time over the phone.  I called the pt to discuss but got no answer so I left a message on his VM (ok per DPR) asking him to be patient when calling [place] [employer] [place] as it can take a while to s/w someone there and that if he needs our assistance to call [name] at [name] office at [facility] at [phone].

## 2021-10-24 ENCOUNTER — Encounter: Payer: Medicare Other | Admitting: Physician Assistant

## 2021-10-24 ENCOUNTER — Other Ambulatory Visit (HOSPITAL_COMMUNITY): Payer: Self-pay
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
## 2021-10-28 ENCOUNTER — Ambulatory Visit: Payer: Medicare Other | Admitting: Physician Assistant

## 2021-10-28 DIAGNOSIS — I4901 Ventricular fibrillation: Secondary | ICD-10-CM | POA: Diagnosis not present

## 2021-10-28 DIAGNOSIS — I469 Cardiac arrest, cause unspecified: Secondary | ICD-10-CM | POA: Diagnosis not present

## 2021-10-29 DIAGNOSIS — E1140 Type 2 diabetes mellitus with diabetic neuropathy, unspecified: Secondary | ICD-10-CM | POA: Diagnosis not present

## 2021-10-29 DIAGNOSIS — J449 Chronic obstructive pulmonary disease, unspecified: Secondary | ICD-10-CM | POA: Diagnosis not present

## 2021-10-29 DIAGNOSIS — E7800 Pure hypercholesterolemia, unspecified: Secondary | ICD-10-CM | POA: Diagnosis not present

## 2021-10-29 DIAGNOSIS — I2510 Atherosclerotic heart disease of native coronary artery without angina pectoris: Secondary | ICD-10-CM | POA: Diagnosis not present

## 2021-10-29 DIAGNOSIS — I4891 Unspecified atrial fibrillation: Secondary | ICD-10-CM | POA: Diagnosis not present

## 2021-10-29 DIAGNOSIS — M170 Bilateral primary osteoarthritis of knee: Secondary | ICD-10-CM | POA: Diagnosis not present

## 2021-10-29 DIAGNOSIS — I10 Essential (primary) hypertension: Secondary | ICD-10-CM | POA: Diagnosis not present

## 2021-10-29 DIAGNOSIS — E1159 Type 2 diabetes mellitus with other circulatory complications: Secondary | ICD-10-CM | POA: Diagnosis not present

## 2021-10-29 DIAGNOSIS — I5042 Chronic combined systolic (congestive) and diastolic (congestive) heart failure: Secondary | ICD-10-CM | POA: Diagnosis not present

## 2021-10-30 NOTE — Telephone Encounter (Signed)
**Note De-Identified [place] Obfuscation** Letter received Ross [name] fax from [facility] and [place] stating that they denied the pt asst with [name] due to missing information: Medicare Beneficiary ID Income documentation The letter states that they have notified the pt of this denial as well.

## 2021-10-31 ENCOUNTER — Other Ambulatory Visit (HOSPITAL_COMMUNITY): Payer: Self-pay

## 2021-11-04 ENCOUNTER — Telehealth: Payer: Self-pay | Admitting: Cardiology

## 2021-11-04 NOTE — Telephone Encounter (Signed)
Informed [facility] that form faxed today.

## 2021-11-04 NOTE — Telephone Encounter (Signed)
Mary from [facility] is calling in regards to next steps with pts lifevest and ICD... please advise.

## 2021-11-05 ENCOUNTER — Telehealth: Payer: Self-pay | Admitting: Cardiology

## 2021-11-05 ENCOUNTER — Other Ambulatory Visit (HOSPITAL_COMMUNITY): Payer: Self-pay

## 2021-11-05 DIAGNOSIS — Z01812 Encounter for preprocedural laboratory examination: Secondary | ICD-10-CM

## 2021-11-05 DIAGNOSIS — I4901 Ventricular fibrillation: Secondary | ICD-10-CM

## 2021-11-05 NOTE — Telephone Encounter (Signed)
Pt have 2 upcoming appts, he wants to know since he is scheduled for a procedure on 11/21/21 he wants to know if its necessary for him to keep both appts.

## 2021-11-05 NOTE — Telephone Encounter (Signed)
Pt advised to keep Friday appt. Aware next week appt will be cancelled. Will go over everything on Friday when he comes in Patient verbalized understanding and agreeable to plan.

## 2021-11-06 ENCOUNTER — Other Ambulatory Visit (HOSPITAL_COMMUNITY): Payer: Self-pay

## 2021-11-07 ENCOUNTER — Encounter: Payer: Self-pay | Admitting: *Deleted

## 2021-11-07 ENCOUNTER — Telehealth: Payer: Self-pay

## 2021-11-07 ENCOUNTER — Encounter: Payer: Self-pay | Admitting: Physician Assistant

## 2021-11-07 ENCOUNTER — Other Ambulatory Visit (HOSPITAL_COMMUNITY): Payer: Self-pay

## 2021-11-07 ENCOUNTER — Other Ambulatory Visit: Payer: Self-pay

## 2021-11-07 ENCOUNTER — Other Ambulatory Visit: Payer: Medicare Other | Admitting: *Deleted

## 2021-11-07 ENCOUNTER — Ambulatory Visit: Payer: Medicare Other | Admitting: Physician Assistant

## 2021-11-07 VITALS — BP 118/76 | HR 81 | Ht 73.0 in | Wt 247.0 lb

## 2021-11-07 DIAGNOSIS — Z950 Presence of cardiac pacemaker: Secondary | ICD-10-CM | POA: Diagnosis not present

## 2021-11-07 DIAGNOSIS — I255 Ischemic cardiomyopathy: Secondary | ICD-10-CM | POA: Diagnosis not present

## 2021-11-07 DIAGNOSIS — I2510 Atherosclerotic heart disease of native coronary artery without angina pectoris: Secondary | ICD-10-CM | POA: Diagnosis not present

## 2021-11-07 DIAGNOSIS — I469 Cardiac arrest, cause unspecified: Secondary | ICD-10-CM | POA: Diagnosis not present

## 2021-11-07 DIAGNOSIS — I5042 Chronic combined systolic (congestive) and diastolic (congestive) heart failure: Secondary | ICD-10-CM | POA: Diagnosis not present

## 2021-11-07 DIAGNOSIS — I4821 Permanent atrial fibrillation: Secondary | ICD-10-CM | POA: Diagnosis not present

## 2021-11-07 DIAGNOSIS — I10 Essential (primary) hypertension: Secondary | ICD-10-CM

## 2021-11-07 DIAGNOSIS — I4901 Ventricular fibrillation: Secondary | ICD-10-CM

## 2021-11-07 DIAGNOSIS — Z01812 Encounter for preprocedural laboratory examination: Secondary | ICD-10-CM | POA: Diagnosis not present

## 2021-11-07 LAB — CUP PACEART INCLINIC DEVICE CHECK
Battery Remaining Longevity: 64 mo
Battery Voltage: 3 V
Brady Statistic RV Percent Paced: 98.64 %
Date Time Interrogation Session: 20221118185306
Implantable Lead Implant Date: 20170126
Implantable Lead Location: 753860
Implantable Lead Model: 5076
Implantable Pulse Generator Implant Date: 20170126
Lead Channel Impedance Value: 361 Ohm
Lead Channel Impedance Value: 418 Ohm
Lead Channel Pacing Threshold Amplitude: 1.125 V
Lead Channel Pacing Threshold Pulse Width: 0.4 ms
Lead Channel Sensing Intrinsic Amplitude: 24.375 mV
Lead Channel Sensing Intrinsic Amplitude: 24.375 mV
Lead Channel Setting Pacing Amplitude: 2.25 V
Lead Channel Setting Pacing Pulse Width: 0.4 ms
Lead Channel Setting Sensing Sensitivity: 2 mV
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
## 2021-11-07 NOTE — Telephone Encounter (Signed)
**Note De-Identified  Obfuscation** The pt left his BMSPAF application for [employer] at the office and while here he requested a call from me as he stated he has not received his oop expense report from [place] [facility] yet.  I called the pt but got no answer so I left a message on his VM asking him to call [name] at Dr Camnitz/Dr Smith's office at [facility] at [phone].

## 2021-11-07 NOTE — Telephone Encounter (Signed)
Ok thanks 
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
## 2021-11-07 NOTE — Patient Instructions (Addendum)
Medication Instructions:   Your physician recommends that you continue on your current medications as directed. Please refer to the Current Medication list given to you today.  *If you need a refill on your cardiac medications before your next appointment, please call your pharmacy*   Lab Work: [place]   If you have labs (blood work) drawn today and your tests are completely normal, you will receive your results only by: [place] (if you have MyChart) OR A paper copy in the mail If you have any lab test that is abnormal or we need to change your treatment, we will call you to review the results.   Testing/Procedures: SEE LETTER FOR DEVICE PROCEDURE ON 11-21-21     Follow-Up: At [facility], you and your health needs are our priority.  As part of our continuing mission to provide you with exceptional heart care, we have created designated Provider Care Teams.  These Care Teams include your primary Cardiologist (physician) and Advanced Practice Providers (APPs -  Physician Assistants and Nurse Practitioners) who all work together to provide you with the care you need, when you need it.  We recommend signing up for the patient portal called "MyChart".  Sign up information is provided on this After Visit Summary.  MyChart is used to connect with patients for Virtual Visits (Telemedicine).  Patients are able to view lab/test results, encounter notes, upcoming appointments, etc.  Non-urgent messages can be sent to your provider as well.   To learn more about what you can do with MyChart, go to [email or web address].    Your next appointment:  AFTER 11-21-21  14 DAY WOUND CHECK VISIT   3 month(s)  The format for your next appointment:   In Person  Provider:   Allegra Lai, MD{    Other Instructions:

## 2021-11-08 LAB — BASIC METABOLIC PANEL
BUN/Creatinine Ratio: 13 (ref 10–24)
BUN: 22 mg/dL (ref 8–27)
CO2: 20 mmol/L (ref 20–29)
Calcium: 11.1 mg/dL — ABNORMAL HIGH (ref 8.6–10.2)
Chloride: 108 mmol/L — ABNORMAL HIGH (ref 96–106)
Creatinine, Ser: 1.64 mg/dL — ABNORMAL HIGH (ref 0.76–1.27)
Glucose: 88 mg/dL (ref 70–99)
Potassium: 4.2 mmol/L (ref 3.5–5.2)
Sodium: 143 mmol/L (ref 134–144)
eGFR: 46 mL/min/{1.73_m2} — ABNORMAL LOW (ref >59)

## 2021-11-08 LAB — CBC
Hematocrit: 44.2 % (ref 37.5–51.0)
Hemoglobin: 14.4 g/dL (ref 13.0–17.7)
MCH: 29.8 pg (ref 26.6–33.0)
MCHC: 32.6 g/dL (ref 31.5–35.7)
MCV: 91 fL (ref 79–97)
Platelets: 290 10*3/uL (ref 150–450)
RBC: 4.84 x10E6/uL (ref 4.14–5.80)
RDW: 11.8 % (ref 11.6–15.4)
WBC: 7.9 10*3/uL (ref 3.4–10.8)

## 2021-11-10 NOTE — Telephone Encounter (Signed)
Patient returning call.

## 2021-11-11 NOTE — Telephone Encounter (Signed)
**Note De-Identified  Obfuscation** The pt is unsure if he has met his out of pocket RX expense requirement using his expense report from [place] [phone]) and is waiting on his expense report from [employer].  I advised him to call [employer] to find out what his oop requirement amount is.  He states that he will call [employer] (I gave him their phone number). He states that if he has already met that amount through [place] he will request that we fax his application as is and if not, we will hold on to his application until he receives his oop document from [employer] and brings it to the office so we can add it to his application and fax all to [employer].

## 2021-11-12 ENCOUNTER — Ambulatory Visit: Payer: Medicare Other

## 2021-11-12 NOTE — Addendum Note (Signed)
Addended by: Claude Manges on: 11/12/2021 01:53 PM   Modules accepted: Orders

## 2021-11-20 ENCOUNTER — Other Ambulatory Visit (HOSPITAL_COMMUNITY): Payer: Self-pay

## 2021-11-20 MED ORDER — GABAPENTIN 300 MG PO CAPS
300.0000 mg | ORAL_CAPSULE | Freq: Every day | ORAL | 2 refills | Status: DC
  Filled 2021-11-20 – 2021-11-21 (×2): qty 30, 30d supply, fill #0
  Filled 2021-12-22: qty 30, 30d supply, fill #1
  Filled 2022-01-26: qty 30, 30d supply, fill #2

## 2021-11-20 NOTE — Pre-Procedure Instructions (Signed)
Instructed patient on the following items: Arrival time 1130 Nothing to eat or drink after midnight No meds AM of procedure Responsible person to drive you home and stay with you for 24 hrs Wash with special soap night before and morning of procedure If on anti-coagulant drug instructions Eliquis- hold 3 days before

## 2021-11-21 ENCOUNTER — Other Ambulatory Visit: Payer: Self-pay

## 2021-11-21 ENCOUNTER — Ambulatory Visit (HOSPITAL_COMMUNITY)
Admission: RE | Disposition: A | Discharge status: Home / Self Care | Payer: Medicare Other | Source: Home / Self Care | Attending: Cardiology

## 2021-11-21 ENCOUNTER — Ambulatory Visit (HOSPITAL_COMMUNITY): Payer: Medicare Other

## 2021-11-21 ENCOUNTER — Ambulatory Visit (HOSPITAL_COMMUNITY)
Admission: RE | Admit: 2021-11-21 | Discharge: 2021-11-21 | Disposition: A | Discharge status: Home / Self Care | Payer: Medicare Other | Attending: Cardiology | Admitting: Cardiology

## 2021-11-21 ENCOUNTER — Other Ambulatory Visit (HOSPITAL_COMMUNITY): Payer: Self-pay

## 2021-11-21 DIAGNOSIS — I4892 Unspecified atrial flutter: Secondary | ICD-10-CM | POA: Insufficient documentation

## 2021-11-21 DIAGNOSIS — Z7901 Long term (current) use of anticoagulants: Secondary | ICD-10-CM | POA: Diagnosis not present

## 2021-11-21 DIAGNOSIS — I255 Ischemic cardiomyopathy: Secondary | ICD-10-CM

## 2021-11-21 DIAGNOSIS — I110 Hypertensive heart disease with heart failure: Secondary | ICD-10-CM | POA: Insufficient documentation

## 2021-11-21 DIAGNOSIS — Z8674 Personal history of sudden cardiac arrest: Secondary | ICD-10-CM | POA: Diagnosis not present

## 2021-11-21 DIAGNOSIS — Z79899 Other long term (current) drug therapy: Secondary | ICD-10-CM | POA: Diagnosis not present

## 2021-11-21 DIAGNOSIS — E119 Type 2 diabetes mellitus without complications: Secondary | ICD-10-CM | POA: Diagnosis not present

## 2021-11-21 DIAGNOSIS — I5042 Chronic combined systolic (congestive) and diastolic (congestive) heart failure: Secondary | ICD-10-CM | POA: Diagnosis not present

## 2021-11-21 DIAGNOSIS — Z95818 Presence of other cardiac implants and grafts: Secondary | ICD-10-CM

## 2021-11-21 DIAGNOSIS — I517 Cardiomegaly: Secondary | ICD-10-CM | POA: Diagnosis not present

## 2021-11-21 DIAGNOSIS — I4821 Permanent atrial fibrillation: Secondary | ICD-10-CM | POA: Insufficient documentation

## 2021-11-21 DIAGNOSIS — E785 Hyperlipidemia, unspecified: Secondary | ICD-10-CM | POA: Insufficient documentation

## 2021-11-21 DIAGNOSIS — I472 Ventricular tachycardia, unspecified: Secondary | ICD-10-CM | POA: Diagnosis not present

## 2021-11-21 DIAGNOSIS — I25810 Atherosclerosis of coronary artery bypass graft(s) without angina pectoris: Secondary | ICD-10-CM | POA: Insufficient documentation

## 2021-11-21 DIAGNOSIS — I469 Cardiac arrest, cause unspecified: Secondary | ICD-10-CM

## 2021-11-21 HISTORY — PX: BIV ICD INSERTION CRT-D: EP1195

## 2021-11-21 LAB — GLUCOSE, CAPILLARY: Glucose-Capillary: 84 mg/dL (ref 70–99)

## 2021-11-21 SURGERY — BIV ICD INSERTION CRT-D

## 2021-11-21 MED ORDER — HEPARIN (PORCINE) IN NACL 1000-0.9 UT/500ML-% IV SOLN
INTRAVENOUS | Status: DC | PRN
  Administered 2021-11-21: 500 mL

## 2021-11-21 MED ORDER — IOHEXOL 350 MG/ML SOLN
INTRAVENOUS | Status: DC | PRN
  Administered 2021-11-21: 15 mL via INTRAVENOUS

## 2021-11-21 MED ORDER — CHLORHEXIDINE GLUCONATE 4 % EX LIQD: 60.0000 mL | Freq: Once | CUTANEOUS | Status: DC

## 2021-11-21 MED ORDER — CEFAZOLIN SODIUM-DEXTROSE 2-4 GM/100ML-% IV SOLN
2.0000 g | INTRAVENOUS | Status: AC
  Administered 2021-11-21: 2 g via INTRAVENOUS

## 2021-11-21 MED ORDER — FENTANYL CITRATE (PF) 100 MCG/2ML IJ SOLN
INTRAMUSCULAR | Status: DC | PRN
  Administered 2021-11-21 (×5): 25 ug via INTRAVENOUS

## 2021-11-21 MED ORDER — LIDOCAINE HCL 1 % IJ SOLN
INTRAMUSCULAR | Status: AC
  Filled 2021-11-21: qty 20

## 2021-11-21 MED ORDER — SODIUM CHLORIDE 0.9 % IV SOLN: INTRAVENOUS | Status: DC

## 2021-11-21 MED ORDER — HEPARIN (PORCINE) IN NACL 1000-0.9 UT/500ML-% IV SOLN
INTRAVENOUS | Status: AC
  Filled 2021-11-21: qty 500

## 2021-11-21 MED ORDER — SODIUM CHLORIDE 0.9 % IV SOLN: 80.0000 mg | INTRAVENOUS | Status: DC

## 2021-11-21 MED ORDER — CEFAZOLIN SODIUM-DEXTROSE 2-4 GM/100ML-% IV SOLN
INTRAVENOUS | Status: AC
  Filled 2021-11-21: qty 100

## 2021-11-21 MED ORDER — MIDAZOLAM HCL 5 MG/5ML IJ SOLN
INTRAMUSCULAR | Status: AC
  Filled 2021-11-21: qty 5

## 2021-11-21 MED ORDER — FENTANYL CITRATE (PF) 100 MCG/2ML IJ SOLN
INTRAMUSCULAR | Status: AC
  Filled 2021-11-21: qty 2

## 2021-11-21 MED ORDER — ONDANSETRON HCL 4 MG/2ML IJ SOLN: 4.0000 mg | Freq: Four times a day (QID) | INTRAMUSCULAR | Status: DC | PRN

## 2021-11-21 MED ORDER — MIDAZOLAM HCL 5 MG/5ML IJ SOLN
INTRAMUSCULAR | Status: DC | PRN
  Administered 2021-11-21 (×2): 1 mg via INTRAVENOUS
  Administered 2021-11-21: 2 mg via INTRAVENOUS
  Administered 2021-11-21: 1 mg via INTRAVENOUS

## 2021-11-21 MED ORDER — SODIUM CHLORIDE 0.9 % IV SOLN
INTRAVENOUS | Status: AC
  Filled 2021-11-21: qty 2

## 2021-11-21 MED ORDER — LIDOCAINE HCL (PF) 1 % IJ SOLN
INTRAMUSCULAR | Status: DC | PRN
  Administered 2021-11-21: 80 mL via INTRADERMAL
  Administered 2021-11-21: 20 mL via INTRADERMAL

## 2021-11-21 MED ORDER — ACETAMINOPHEN 325 MG PO TABS
325.0000 mg | ORAL_TABLET | ORAL | Status: DC | PRN
  Filled 2021-11-21: qty 2

## 2021-11-21 SURGICAL SUPPLY — 10 items
CABLE SURGICAL S-101-97-12 (CABLE) ×2 IMPLANT
GUIDEWIRE ANGLED .035X150CM (WIRE) ×1 IMPLANT
KIT MICROPUNCTURE NIT STIFF (SHEATH) ×2 IMPLANT
PAD DEFIB RADIO PHYSIO CONN (PAD) ×2 IMPLANT
POUCH AIGIS-R ANTIBACT PPM (Mesh General) ×2 IMPLANT
POUCH AIGIS-R ANTIBACT PPM MED (Mesh General) IMPLANT
SHEATH 7FR PRELUDE SNAP 13 (SHEATH) ×2 IMPLANT
SHEATH 9FR PRELUDE SNAP 13 (SHEATH) ×1 IMPLANT
TRAY PACEMAKER INSERTION (PACKS) ×2 IMPLANT
WIRE HI TORQ VERSACORE-J 145CM (WIRE) ×1 IMPLANT

## 2021-11-21 NOTE — Interval H&P Note (Signed)
History and Physical Interval Note:  11/21/2021 12:02 PM  Ross [name]  has presented today for surgery, with the diagnosis of cardiomyopathy.  The various methods of treatment have been discussed with the patient and family. After consideration of risks, benefits and other options for treatment, the patient has consented to  Procedure(s): BIV ICD INSERTION CRT-D (N/A) as a surgical intervention.  The patient's history has been reviewed, patient examined, no change in status, stable for surgery.  I have reviewed the patient's chart and labs.  Questions were answered to the patient's satisfaction.     Ross [name]  ICD Criteria  Current LVEF:35-40%. Within 12 months prior to implant: Yes   Heart failure history: Yes, Class II  Cardiomyopathy history: Yes, Mixed Ischemic and Non-Ischemic Cardiomyopathies.  Atrial Fibrillation/Atrial Flutter: Yes, Permanent.  Ventricular tachycardia history: Yes, Hemodynamic instability present. VT Type: Sustained Ventricular Tachycardia - Monomorphic.  Cardiac arrest history: Yes, Ventricular Tachycardia.  History of syndromes with risk of sudden death: No.  Previous ICD: No.  Current ICD indication: Secondary  PPM indication: Yes. Pacing type: Ventricular. Greater than 40% RV pacing requirement anticipated. Indication: [place]itz Type II  Class I or II Bradycardia indication present: Yes  Beta Blocker therapy for 3 or more months: Yes, prescribed.   Ace Inhibitor/ARB therapy for 3 or more months: Yes, prescribed.    I have seen Ross [name] is a 65 y.o. malepre-procedural and has been referred by Charlcie Cradle for consideration of ICD implant for secondary prevention of sudden death.  The patient's chart has been reviewed and they meet criteria for ICD implant.  I have had a thorough discussion with the patient reviewing options.  The patient and their family (if available) have had opportunities to ask questions and have them answered. The  patient and I have decided together through the [place] [employer] to implant ICD at this time.  Risks, benefits, alternatives to ICD implantation were discussed in detail with the patient today. The patient  understands that the risks include but are not limited to bleeding, infection, pneumothorax, perforation, tamponade, vascular damage, renal failure, MI, stroke, death, inappropriate shocks, and lead dislodgement and  wishes to proceed.

## 2021-11-24 ENCOUNTER — Other Ambulatory Visit (HOSPITAL_COMMUNITY): Payer: Self-pay

## 2021-11-24 ENCOUNTER — Encounter (HOSPITAL_COMMUNITY): Payer: Self-pay | Admitting: Cardiology

## 2021-11-24 MED FILL — Gentamicin Sulfate Inj 40 MG/ML: INTRAMUSCULAR | Qty: 80 | Status: AC

## 2021-11-24 NOTE — Telephone Encounter (Signed)
**Note De-Identified  Obfuscation** The pt has not brought in his oop RX expense document from [employer] yet but I am forwarding his application to Dr [employer] nurse to be signed and faxed to [facility] now as he may have paid out enough without that document and if not we will fax his [employer] oop xpense report to [employer] when he brings it to [place].    I have completed the providers page of his application and have e-mailed all to Dr [employer] nurse so she can obtain his signature, date it, and to fax all to [employer] at the fax number written on the cover letter included or to place in the to be faxed basket in Medical Records to be faxed.

## 2021-11-27 DIAGNOSIS — I469 Cardiac arrest, cause unspecified: Secondary | ICD-10-CM | POA: Diagnosis not present

## 2021-11-27 DIAGNOSIS — I4901 Ventricular fibrillation: Secondary | ICD-10-CM | POA: Diagnosis not present

## 2021-11-28 ENCOUNTER — Ambulatory Visit (INDEPENDENT_AMBULATORY_CARE_PROVIDER_SITE_OTHER): Payer: Medicare Other | Admitting: Interventional Cardiology

## 2021-11-28 ENCOUNTER — Other Ambulatory Visit: Payer: Self-pay

## 2021-11-28 ENCOUNTER — Encounter: Payer: Self-pay | Admitting: Interventional Cardiology

## 2021-11-28 VITALS — BP 122/68 | HR 68 | Ht 73.0 in | Wt 254.0 lb

## 2021-11-28 DIAGNOSIS — I5042 Chronic combined systolic (congestive) and diastolic (congestive) heart failure: Secondary | ICD-10-CM

## 2021-11-28 DIAGNOSIS — I10 Essential (primary) hypertension: Secondary | ICD-10-CM | POA: Diagnosis not present

## 2021-11-28 DIAGNOSIS — I4821 Permanent atrial fibrillation: Secondary | ICD-10-CM

## 2021-11-28 DIAGNOSIS — I255 Ischemic cardiomyopathy: Secondary | ICD-10-CM | POA: Diagnosis not present

## 2021-11-28 DIAGNOSIS — Z950 Presence of cardiac pacemaker: Secondary | ICD-10-CM | POA: Diagnosis not present

## 2021-11-28 DIAGNOSIS — I2510 Atherosclerotic heart disease of native coronary artery without angina pectoris: Secondary | ICD-10-CM

## 2021-11-28 DIAGNOSIS — I469 Cardiac arrest, cause unspecified: Secondary | ICD-10-CM

## 2021-11-28 NOTE — Telephone Encounter (Signed)
Faxed, confirmation received 

## 2021-11-28 NOTE — Patient Instructions (Signed)
Medication Instructions:  ?Your physician recommends that you continue on your current medications as directed. Please refer to the Current Medication list given to you today. ? ?*If you need a refill on your cardiac medications before your next appointment, please call your pharmacy* ? ? ?Lab Work: ?None ?If you have labs (blood work) drawn today and your tests are completely normal, you will receive your results only by: ?MyChart Message (if you have MyChart) OR ?A paper copy in the mail ?If you have any lab test that is abnormal or we need to change your treatment, we will call you to review the results. ? ? ?Testing/Procedures: ?None ? ? ?Follow-Up: ?At [facility], you and your health needs are our priority.  As part of our continuing mission to provide you with exceptional heart care, we have created designated Provider Care Teams.  These Care Teams include your primary Cardiologist (physician) and Advanced Practice Providers (APPs -  Physician Assistants and Nurse Practitioners) who all work together to provide you with the care you need, when you need it. ? ?We recommend signing up for the patient portal called "MyChart".  Sign up information is provided on this After Visit Summary.  MyChart is used to connect with patients for Virtual Visits (Telemedicine).  Patients are able to view lab/test results, encounter notes, upcoming appointments, etc.  Non-urgent messages can be sent to your provider as well.   ?To learn more about what you can do with MyChart, go to [email or web address].   ? ?Your next appointment:   ?6 month(s) ? ?The format for your next appointment:   ?In Person ? ?Provider:   ?Henry W Smith III, MD  ? ? ?Other Instructions ?  ?
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
## 2021-12-01 ENCOUNTER — Telehealth: Payer: Self-pay

## 2021-12-01 NOTE — Telephone Encounter (Signed)
Pt brought in his OOP expense report. It has been faxed to [employer].

## 2021-12-01 NOTE — Telephone Encounter (Signed)
Letter has been sent to patient instructing them to call us if they are still interested in completing their sleep study. If we have not received a response from the patient within 30 days of this notice, the order will be cancelled and they will need to discuss the need for a sleep study at their next office visit.  ° °

## 2021-12-03 ENCOUNTER — Telehealth: Payer: Self-pay | Admitting: Cardiology

## 2021-12-03 NOTE — Telephone Encounter (Signed)
Ross [name] is calling stating he called [employer] today and they advised him they have not received what's needed for insurance to approve his monitor. The reported they only received partial. Due to this he is requesting the office contact them to make sure approval is received. The phone number the patient gave for contacting them is [phone] and the fax number is [phone].

## 2021-12-03 NOTE — Telephone Encounter (Signed)
Ross [name] is calling requesting to speak with [name] in regards to this.

## 2021-12-04 ENCOUNTER — Ambulatory Visit (INDEPENDENT_AMBULATORY_CARE_PROVIDER_SITE_OTHER): Payer: Medicare Other

## 2021-12-04 ENCOUNTER — Other Ambulatory Visit (HOSPITAL_COMMUNITY): Payer: Self-pay

## 2021-12-04 ENCOUNTER — Other Ambulatory Visit: Payer: Self-pay

## 2021-12-04 ENCOUNTER — Ambulatory Visit: Payer: Medicare Other

## 2021-12-04 DIAGNOSIS — I428 Other cardiomyopathies: Secondary | ICD-10-CM

## 2021-12-04 NOTE — Patient Instructions (Signed)
° °  After Your Pacemaker   Monitor your pacemaker site for redness, swelling, and drainage. Call the device clinic at [phone] if you experience these symptoms or fever/chills.  Your incision was closed with Steri-strips or staples:  You may shower 7 days after your procedure and wash your incision with soap and water. Avoid lotions, ointments, or perfumes over your incision until it is well-healed.     Remote monitoring is used to monitor your pacemaker from home. This monitoring is scheduled every 91 days by our office. It allows [place] to keep an eye on the functioning of your device to ensure it is working properly. You will routinely see your Electrophysiologist annually (more often if necessary).

## 2021-12-04 NOTE — Progress Notes (Signed)
Wound check only- Unsuccessful CRT-D upgrade.  Pt scheduled for f/u with Dr. Quentin Ore on 12/29.    Steri-strips removed, no redness or edema present.  Incision edges well approximated, no s/s of infection.

## 2021-12-05 NOTE — Telephone Encounter (Signed)
Completed missing information/checks. Re faxed Confirmation received

## 2021-12-05 NOTE — Telephone Encounter (Signed)
I called and spoke with the pt and he is asking how he needs to apply for next year. He stated that all of the information he just sent in would be the same.  I told him I would speak with [name] to see how she usually handles this and one of [place] would call him next week.

## 2021-12-08 NOTE — Telephone Encounter (Signed)
**Note De-Identified  Obfuscation** No answer so I left a message advising the pt to call [employer] at [phone] to request that they mail him an application to apply for asst with Eliquis. I also advised that unless things have changed since last year, he will need to provider his 2022 proof of income and his 2023 RX expense report with his application.  I did leave my name and the office phone number so he can call me back if he has questions.

## 2021-12-16 ENCOUNTER — Ambulatory Visit (INDEPENDENT_AMBULATORY_CARE_PROVIDER_SITE_OTHER): Payer: Medicare Other

## 2021-12-16 ENCOUNTER — Other Ambulatory Visit (HOSPITAL_COMMUNITY): Payer: Self-pay

## 2021-12-16 DIAGNOSIS — I428 Other cardiomyopathies: Secondary | ICD-10-CM

## 2021-12-16 MED ORDER — PANTOPRAZOLE SODIUM 40 MG PO TBEC
40.0000 mg | DELAYED_RELEASE_TABLET | Freq: Every day | ORAL | 2 refills | Status: DC
  Filled 2021-12-16: qty 30, 30d supply, fill #0
  Filled 2022-01-15: qty 30, 30d supply, fill #1
  Filled 2022-02-16: qty 30, 30d supply, fill #2

## 2021-12-17 LAB — CUP PACEART REMOTE DEVICE CHECK
Battery Remaining Longevity: 59 mo
Battery Voltage: 3 V
Brady Statistic RV Percent Paced: 99.74 %
Date Time Interrogation Session: 20221227235633
Implantable Lead Implant Date: 20170126
Implantable Lead Location: 753860
Implantable Lead Model: 5076
Implantable Pulse Generator Implant Date: 20170126
Lead Channel Impedance Value: 323 Ohm
Lead Channel Impedance Value: 399 Ohm
Lead Channel Pacing Threshold Amplitude: 1.25 V
Lead Channel Pacing Threshold Pulse Width: 0.4 ms
Lead Channel Sensing Intrinsic Amplitude: 12.5 mV
Lead Channel Sensing Intrinsic Amplitude: 12.5 mV
Lead Channel Setting Pacing Amplitude: 2.5 V
Lead Channel Setting Pacing Pulse Width: 0.4 ms
Lead Channel Setting Sensing Sensitivity: 2 mV

## 2021-12-18 ENCOUNTER — Ambulatory Visit (INDEPENDENT_AMBULATORY_CARE_PROVIDER_SITE_OTHER): Payer: Medicare Other | Admitting: Cardiology

## 2021-12-18 ENCOUNTER — Encounter: Payer: Self-pay | Admitting: Cardiology

## 2021-12-18 ENCOUNTER — Other Ambulatory Visit: Payer: Self-pay

## 2021-12-18 ENCOUNTER — Other Ambulatory Visit (HOSPITAL_COMMUNITY): Payer: Self-pay

## 2021-12-18 VITALS — BP 96/62 | HR 67 | Ht 73.0 in | Wt 258.6 lb

## 2021-12-18 DIAGNOSIS — I4819 Other persistent atrial fibrillation: Secondary | ICD-10-CM

## 2021-12-18 DIAGNOSIS — I469 Cardiac arrest, cause unspecified: Secondary | ICD-10-CM

## 2021-12-18 DIAGNOSIS — I5042 Chronic combined systolic (congestive) and diastolic (congestive) heart failure: Secondary | ICD-10-CM

## 2021-12-18 DIAGNOSIS — Z950 Presence of cardiac pacemaker: Secondary | ICD-10-CM

## 2021-12-18 LAB — PACEMAKER DEVICE OBSERVATION
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
## 2021-12-18 NOTE — Patient Instructions (Signed)
Medication Instructions:  Your physician recommends that you continue on your current medications as directed. Please refer to the Current Medication list given to you today. *If you need a refill on your cardiac medications before your next appointment, please call your pharmacy*  Lab Work: None ordered. If you have labs (blood work) drawn today and your tests are completely normal, you will receive your results only by: [place] (if you have MyChart) OR A paper copy in the mail If you have any lab test that is abnormal or we need to change your treatment, we will call you to review the results.  Testing/Procedures: None ordered.  Follow-Up: At [facility], you and your health needs are our priority.  As part of our continuing mission to provide you with exceptional heart care, we have created designated Provider Care Teams.  These Care Teams include your primary Cardiologist (physician) and Advanced Practice Providers (APPs -  Physician Assistants and Nurse Practitioners) who all work together to provide you with the care you need, when you need it.  Your next appointment:

## 2021-12-22 ENCOUNTER — Other Ambulatory Visit (HOSPITAL_COMMUNITY): Payer: Self-pay

## 2021-12-24 NOTE — Progress Notes (Signed)
Remote pacemaker transmission.   

## 2021-12-26 ENCOUNTER — Telehealth: Payer: Self-pay

## 2021-12-26 ENCOUNTER — Other Ambulatory Visit (HOSPITAL_COMMUNITY): Payer: Self-pay

## 2021-12-26 DIAGNOSIS — I469 Cardiac arrest, cause unspecified: Secondary | ICD-10-CM

## 2021-12-26 DIAGNOSIS — Z01818 Encounter for other preprocedural examination: Secondary | ICD-10-CM

## 2021-12-26 DIAGNOSIS — I255 Ischemic cardiomyopathy: Secondary | ICD-10-CM

## 2021-12-26 NOTE — Telephone Encounter (Signed)
Offered the patient a procedure date of January 26. Set up and ordered labs. Will send instructions over mychart and then call me to go over. Patient verbalized understanding and agreement.

## 2021-12-28 DIAGNOSIS — I469 Cardiac arrest, cause unspecified: Secondary | ICD-10-CM | POA: Diagnosis not present

## 2021-12-28 DIAGNOSIS — I4901 Ventricular fibrillation: Secondary | ICD-10-CM | POA: Diagnosis not present

## 2021-12-31 ENCOUNTER — Encounter: Payer: Self-pay | Admitting: *Deleted

## 2022-01-02 ENCOUNTER — Other Ambulatory Visit: Payer: Medicare Other

## 2022-01-05 ENCOUNTER — Other Ambulatory Visit (HOSPITAL_COMMUNITY): Payer: Self-pay

## 2022-01-05 ENCOUNTER — Other Ambulatory Visit: Payer: Self-pay

## 2022-01-05 ENCOUNTER — Other Ambulatory Visit: Payer: Medicare Other

## 2022-01-05 DIAGNOSIS — I469 Cardiac arrest, cause unspecified: Secondary | ICD-10-CM | POA: Diagnosis not present

## 2022-01-05 DIAGNOSIS — Z01818 Encounter for other preprocedural examination: Secondary | ICD-10-CM

## 2022-01-05 DIAGNOSIS — I255 Ischemic cardiomyopathy: Secondary | ICD-10-CM | POA: Diagnosis not present

## 2022-01-05 LAB — CBC WITH DIFFERENTIAL/PLATELET
Basophils Absolute: 0 10*3/uL (ref 0.0–0.2)
Basos: 0 %
EOS (ABSOLUTE): 0.4 10*3/uL (ref 0.0–0.4)
Eos: 5 %
Hematocrit: 41.7 % (ref 37.5–51.0)
Hemoglobin: 14.3 g/dL (ref 13.0–17.7)
Lymphocytes Absolute: 1.9 10*3/uL (ref 0.7–3.1)
Lymphs: 27 %
MCH: 30.2 pg (ref 26.6–33.0)
MCHC: 34.3 g/dL (ref 31.5–35.7)
MCV: 88 fL (ref 79–97)
Monocytes Absolute: 0.7 10*3/uL (ref 0.1–0.9)
Monocytes: 10 %
Neutrophils Absolute: 3.9 10*3/uL (ref 1.4–7.0)
Neutrophils: 58 %
Platelets: 240 10*3/uL (ref 150–450)
RBC: 4.74 x10E6/uL (ref 4.14–5.80)
RDW: 14.6 % (ref 11.6–15.4)
WBC: 6.9 10*3/uL (ref 3.4–10.8)

## 2022-01-05 LAB — BASIC METABOLIC PANEL
BUN/Creatinine Ratio: 17 (ref 10–24)
BUN: 26 mg/dL (ref 8–27)
CO2: 23 mmol/L (ref 20–29)
Calcium: 11.4 mg/dL — ABNORMAL HIGH (ref 8.6–10.2)
Chloride: 105 mmol/L (ref 96–106)
Creatinine, Ser: 1.53 mg/dL — ABNORMAL HIGH (ref 0.76–1.27)
Glucose: 132 mg/dL — ABNORMAL HIGH (ref 70–99)
Potassium: 4.7 mmol/L (ref 3.5–5.2)
Sodium: 139 mmol/L (ref 134–144)
eGFR: 50 mL/min/{1.73_m2} — ABNORMAL LOW (ref >59)

## 2022-01-05 NOTE — Telephone Encounter (Signed)
Talked to Mahmud and rescheduled labs for today. Patient aware he needs to meet with me after labs for procedure instructions.   Verbalized understanding.

## 2022-01-05 NOTE — Telephone Encounter (Signed)
Ross [name] is calling stating he missed his labs for last week that he needs prior to his procedure. He is requesting a callback from Otila Kluver in regards to rescheduling due to needing to go over instructions with her as listed in the previous appt notes.

## 2022-01-07 ENCOUNTER — Other Ambulatory Visit (HOSPITAL_COMMUNITY): Payer: Self-pay

## 2022-01-07 ENCOUNTER — Encounter (HOSPITAL_COMMUNITY): Payer: Self-pay

## 2022-01-07 ENCOUNTER — Ambulatory Visit (HOSPITAL_COMMUNITY): Admit: 2022-01-07 | Payer: Medicare Other | Admitting: Cardiology

## 2022-01-07 SURGERY — CARDIOVERSION
Anesthesia: Monitor Anesthesia Care

## 2022-01-07 MED ORDER — ALPRAZOLAM 0.5 MG PO TABS
0.5000 mg | ORAL_TABLET | Freq: Every evening | ORAL | 1 refills | Status: DC
Start: 2022-01-07 — End: 2022-03-12
  Filled 2022-01-07 – 2022-01-14 (×3): qty 31, 31d supply, fill #0
  Filled 2022-02-16: qty 31, 31d supply, fill #1

## 2022-01-08 ENCOUNTER — Telehealth (HOSPITAL_COMMUNITY): Payer: Self-pay | Admitting: *Deleted

## 2022-01-08 NOTE — Telephone Encounter (Signed)
Reaching out to patient to offer assistance regarding upcoming cardiac imaging study; pt verbalizes understanding of appt date/time, parking situation and where to check in, pre-test NPO status and verified current allergies; name and call back number provided for further questions should they arise  Gordy Clement RN Navigator Cardiac Imaging [facility] [phone] office [phone] cell  Patient confirmed generator on LEFT chest. He is aware to arrive at 2:45pm for his 3:15pm scan.

## 2022-01-09 ENCOUNTER — Other Ambulatory Visit: Payer: Self-pay

## 2022-01-09 ENCOUNTER — Other Ambulatory Visit (HOSPITAL_COMMUNITY): Payer: Self-pay

## 2022-01-09 ENCOUNTER — Ambulatory Visit (HOSPITAL_COMMUNITY)
Admission: RE | Admit: 2022-01-09 | Discharge: 2022-01-09 | Disposition: A | Discharge status: Home / Self Care | Payer: Medicare Other | Source: Ambulatory Visit | Attending: Cardiology | Admitting: Cardiology

## 2022-01-09 DIAGNOSIS — I469 Cardiac arrest, cause unspecified: Secondary | ICD-10-CM | POA: Insufficient documentation

## 2022-01-09 DIAGNOSIS — Z01818 Encounter for other preprocedural examination: Secondary | ICD-10-CM | POA: Diagnosis not present

## 2022-01-09 DIAGNOSIS — I517 Cardiomegaly: Secondary | ICD-10-CM | POA: Diagnosis not present

## 2022-01-09 DIAGNOSIS — I255 Ischemic cardiomyopathy: Secondary | ICD-10-CM | POA: Insufficient documentation

## 2022-01-09 DIAGNOSIS — J984 Other disorders of lung: Secondary | ICD-10-CM | POA: Diagnosis not present

## 2022-01-09 MED ORDER — IOHEXOL 350 MG/ML SOLN
100.0000 mL | Freq: Once | INTRAVENOUS | Status: AC | PRN
  Administered 2022-01-09: 100 mL via INTRAVENOUS

## 2022-01-12 ENCOUNTER — Telehealth: Payer: Self-pay | Admitting: Cardiology

## 2022-01-12 NOTE — Telephone Encounter (Signed)
Patient wanted to talk to Dr. Quentin Ore about his CT results. He was concerned and was not sure if he should still go through with the procedure on Thursday

## 2022-01-12 NOTE — Telephone Encounter (Signed)
Per Dr. Quentin Ore- Schedule Pt last office visit on 01/14/2022 to discuss results of chest CT.  Pt notified.  Pt in agreement with office visit.  Directions given to [facility] office.  Pt thankful for appt.

## 2022-01-12 NOTE — Telephone Encounter (Signed)
Returned call to Pt.  He is concerned about his chest CT for his lead extraction and BIV ICD upgrade.  Attempted to reassure Pt.  He would like Dr. Quentin Ore to advise.  Will message.

## 2022-01-13 ENCOUNTER — Other Ambulatory Visit (HOSPITAL_COMMUNITY): Payer: Self-pay

## 2022-01-14 ENCOUNTER — Ambulatory Visit (INDEPENDENT_AMBULATORY_CARE_PROVIDER_SITE_OTHER): Payer: Medicare Other | Admitting: Cardiology

## 2022-01-14 ENCOUNTER — Encounter: Payer: Self-pay | Admitting: Cardiology

## 2022-01-14 ENCOUNTER — Other Ambulatory Visit: Payer: Self-pay

## 2022-01-14 ENCOUNTER — Other Ambulatory Visit (HOSPITAL_COMMUNITY): Payer: Self-pay

## 2022-01-14 VITALS — HR 88 | Ht 73.0 in | Wt 256.0 lb

## 2022-01-14 DIAGNOSIS — I495 Sick sinus syndrome: Secondary | ICD-10-CM

## 2022-01-14 DIAGNOSIS — Z950 Presence of cardiac pacemaker: Secondary | ICD-10-CM

## 2022-01-14 DIAGNOSIS — I5042 Chronic combined systolic (congestive) and diastolic (congestive) heart failure: Secondary | ICD-10-CM | POA: Diagnosis not present

## 2022-01-14 NOTE — Patient Instructions (Addendum)
Medications: Your physician recommends that you continue on your current medications as directed. Please refer to the Current Medication list given to you today. *If you need a refill on your cardiac medications before your next appointment, please call your pharmacy*  Lab Work: None. If you have labs (blood work) drawn today and your tests are completely normal, you will receive your results only by: [place] (if you have MyChart) OR A paper copy in the mail If you have any lab test that is abnormal or we need to change your treatment, we will call you to review the results.  Testing/Procedures: None.  Follow-Up: At [facility], you and your health needs are our priority.  As part of our continuing mission to provide you with exceptional heart care, we have created designated Provider Care Teams.  These Care Teams include your primary Cardiologist (physician) and Advanced Practice Providers (APPs -  Physician Assistants and Nurse Practitioners) who all work together to provide you with the care you need, when you need it.  Your physician wants you to follow-up in: [place] with Dr. Curt Bears will be in touch to set up your procedure.   Remote monitoring is used to monitor your Pacemaker from home. This monitoring reduces the number of office visits required to check your device to one time per year. It allows [place] to keep an eye on the functioning of your device to ensure it is working properly. You are scheduled for a device check from home on 03/17/22. You may send your transmission at any time that day. If you have a wireless device, the transmission will be sent automatically. After your physician reviews your transmission, you will receive a postcard with your next transmission date.  We recommend signing up for the patient portal called "MyChart".  Sign up information is provided on this After Visit Summary.  MyChart is used to connect with patients for Virtual Visits (Telemedicine).   Patients are able to view lab/test results, encounter notes, upcoming appointments, etc.  Non-urgent messages can be sent to your provider as well.   To learn more about what you can do with MyChart, go to [email or web address].    Any Other Special Instructions Will Be Listed Below (If Applicable).
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
## 2022-01-15 ENCOUNTER — Inpatient Hospital Stay: Admission: RE | Admit: 2022-01-15 | Payer: Medicare Other | Source: Home / Self Care | Admitting: Cardiology

## 2022-01-15 ENCOUNTER — Other Ambulatory Visit (HOSPITAL_COMMUNITY): Payer: Self-pay

## 2022-01-15 ENCOUNTER — Other Ambulatory Visit: Payer: Self-pay | Admitting: Student

## 2022-01-15 ENCOUNTER — Encounter: Admission: RE | Payer: Medicare Other | Source: Home / Self Care

## 2022-01-15 DIAGNOSIS — I4901 Ventricular fibrillation: Secondary | ICD-10-CM

## 2022-01-15 DIAGNOSIS — I5042 Chronic combined systolic (congestive) and diastolic (congestive) heart failure: Secondary | ICD-10-CM

## 2022-01-15 DIAGNOSIS — I25709 Atherosclerosis of coronary artery bypass graft(s), unspecified, with unspecified angina pectoris: Secondary | ICD-10-CM

## 2022-01-15 SURGERY — REMOVAL, ELECTRODE LEAD, CARDIAC PACEMAKER, WITHOUT REPLACEMENT
Anesthesia: General

## 2022-01-15 MED ORDER — CARVEDILOL 6.25 MG PO TABS
6.2500 mg | ORAL_TABLET | Freq: Two times a day (BID) | ORAL | 3 refills | Status: DC
  Filled 2022-01-15: qty 60, 30d supply, fill #0
  Filled 2022-02-16: qty 60, 30d supply, fill #1
  Filled 2022-03-12: qty 60, 30d supply, fill #2
  Filled 2022-04-07: qty 60, 30d supply, fill #3
  Filled 2022-05-05: qty 60, 30d supply, fill #4
  Filled 2022-06-09: qty 60, 30d supply, fill #5
  Filled 2022-07-08: qty 60, 30d supply, fill #6
  Filled 2022-08-10: qty 60, 30d supply, fill #7
  Filled 2022-09-09: qty 60, 30d supply, fill #8
  Filled 2022-10-08: qty 60, 30d supply, fill #9
  Filled 2022-11-09: qty 60, 30d supply, fill #10
  Filled 2022-12-04: qty 60, 30d supply, fill #11

## 2022-01-15 MED ORDER — ATORVASTATIN CALCIUM 80 MG PO TABS
80.0000 mg | ORAL_TABLET | Freq: Every day | ORAL | 3 refills | Status: DC
  Filled 2022-01-15: qty 30, 30d supply, fill #0
  Filled 2022-01-15: qty 90, 90d supply, fill #0
  Filled 2022-02-16: qty 30, 30d supply, fill #1
  Filled 2022-03-12: qty 30, 30d supply, fill #2
  Filled 2022-04-07: qty 30, 30d supply, fill #3
  Filled 2022-05-05: qty 30, 30d supply, fill #4
  Filled 2022-06-09: qty 30, 30d supply, fill #5
  Filled 2022-07-08: qty 30, 30d supply, fill #6
  Filled 2022-08-10: qty 30, 30d supply, fill #7
  Filled 2022-09-09: qty 30, 30d supply, fill #8
  Filled 2022-10-08: qty 30, 30d supply, fill #9
  Filled 2022-11-09: qty 30, 30d supply, fill #10
  Filled 2022-12-04: qty 30, 30d supply, fill #11

## 2022-01-15 MED ORDER — AMIODARONE HCL 200 MG PO TABS
200.0000 mg | ORAL_TABLET | Freq: Every day | ORAL | 3 refills | Status: DC
  Filled 2022-01-15: qty 90, 90d supply, fill #0
  Filled 2022-01-15: qty 30, 30d supply, fill #0
  Filled 2022-02-16: qty 30, 30d supply, fill #1
  Filled 2022-03-12: qty 30, 30d supply, fill #2
  Filled 2022-04-07: qty 30, 30d supply, fill #3
  Filled 2022-05-05: qty 30, 30d supply, fill #4
  Filled 2022-06-09: qty 30, 30d supply, fill #5
  Filled 2022-07-08: qty 30, 30d supply, fill #6
  Filled 2022-08-10: qty 30, 30d supply, fill #7
  Filled 2022-09-09: qty 30, 30d supply, fill #8
  Filled 2022-10-08: qty 30, 30d supply, fill #9
  Filled 2022-11-09: qty 30, 30d supply, fill #10
  Filled 2022-12-04: qty 30, 30d supply, fill #11

## 2022-01-26 ENCOUNTER — Other Ambulatory Visit (HOSPITAL_COMMUNITY): Payer: Self-pay

## 2022-01-27 ENCOUNTER — Other Ambulatory Visit (HOSPITAL_COMMUNITY): Payer: Self-pay

## 2022-01-28 ENCOUNTER — Telehealth: Payer: Self-pay | Admitting: Cardiology

## 2022-01-28 DIAGNOSIS — I4901 Ventricular fibrillation: Secondary | ICD-10-CM | POA: Diagnosis not present

## 2022-01-28 DIAGNOSIS — I469 Cardiac arrest, cause unspecified: Secondary | ICD-10-CM | POA: Diagnosis not present

## 2022-01-28 NOTE — Telephone Encounter (Signed)
Patient is requesting to reschedule procedure to upgrade pacemaker. Please assist.

## 2022-01-29 ENCOUNTER — Other Ambulatory Visit (HOSPITAL_COMMUNITY): Payer: Self-pay

## 2022-01-30 NOTE — Telephone Encounter (Signed)
Spent 10 min on phone with patient. Pt aware that Dr. Curt Bears recommends OV to discuss what is needed. Aware scheduler will call him next week to arrange OV w/ MD and that it will most likely be next month as MD is out of the office several weeks this month. Patient verbalized understanding and agreeable to plan.

## 2022-01-30 NOTE — Telephone Encounter (Signed)
Patient is calling to check on status of his call.  He is calling because he wants to reschedule his procedure. He wants to know what is going on, he states it's been two weeks and he hasn't heard from Dr. Macky Lower office.  Dr. Quentin Ore was suppose to speak to Dr. Curt Bears about this.

## 2022-02-12 ENCOUNTER — Other Ambulatory Visit: Payer: Self-pay

## 2022-02-12 ENCOUNTER — Encounter: Payer: Self-pay | Admitting: Cardiology

## 2022-02-12 ENCOUNTER — Ambulatory Visit (INDEPENDENT_AMBULATORY_CARE_PROVIDER_SITE_OTHER): Payer: Medicare Other | Admitting: Cardiology

## 2022-02-12 VITALS — BP 124/68 | HR 73 | Ht 74.0 in | Wt 259.6 lb

## 2022-02-12 DIAGNOSIS — I5022 Chronic systolic (congestive) heart failure: Secondary | ICD-10-CM | POA: Diagnosis not present

## 2022-02-12 DIAGNOSIS — I472 Ventricular tachycardia, unspecified: Secondary | ICD-10-CM

## 2022-02-12 DIAGNOSIS — I4821 Permanent atrial fibrillation: Secondary | ICD-10-CM | POA: Diagnosis not present

## 2022-02-12 NOTE — Patient Instructions (Signed)
The nurse will send you procedure information via mychart
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
## 2022-02-13 ENCOUNTER — Encounter: Payer: Self-pay | Admitting: Cardiology

## 2022-02-13 ENCOUNTER — Telehealth: Payer: Self-pay

## 2022-02-13 NOTE — Telephone Encounter (Signed)
**Note De-Identified  Obfuscation** The pt left his [employer] application for Eliquis at the office with a request that we call him prior to [place] faxing it to [employer]. I have completed the providers page of his application and then called him but got no answer so I left a message on his VM asking him to call [name] at [employer] office at [facility] at [phone].

## 2022-02-16 ENCOUNTER — Other Ambulatory Visit (HOSPITAL_COMMUNITY): Payer: Self-pay

## 2022-02-16 MED ORDER — GABAPENTIN 300 MG PO CAPS
300.0000 mg | ORAL_CAPSULE | Freq: Every day | ORAL | 3 refills | Status: DC
  Filled 2022-02-16 – 2022-02-25 (×3): qty 30, 30d supply, fill #0
  Filled 2022-03-31: qty 30, 30d supply, fill #1
  Filled 2022-10-08: qty 30, 30d supply, fill #2
  Filled 2022-11-09: qty 30, 30d supply, fill #3

## 2022-02-17 ENCOUNTER — Other Ambulatory Visit (HOSPITAL_COMMUNITY): Payer: Self-pay

## 2022-02-17 ENCOUNTER — Encounter: Payer: Medicare Other | Admitting: Cardiology

## 2022-02-17 ENCOUNTER — Telehealth: Payer: Self-pay | Admitting: Cardiology

## 2022-02-17 NOTE — Telephone Encounter (Signed)
**Note De-Identified  Obfuscation** The pt is unsure of which address to use for his home address on his [employer] application for Eliquis. He states that he stays with his girlfriend and wants his Eliquis shipped to her address. I advised against this as if he lists her address as his home address he will need to include the other people living in that home's income.  I advised him several times to call [employer] as I cannot answer the questions he is asking of me. I advised him also, that the address he puts on the application is up to him as I take care of the providers page of his application and that we will fax to [facility] that other than that he will need to call [employer].  He stated "just leave my [place]. [place], [place] [address] address on it.  I advised him that I am emailing his application to [employer] nurse now so she can obtain his signature, date it, and to fax to [facility] at the fax number written on the cover letter included.

## 2022-02-17 NOTE — Telephone Encounter (Signed)
New Message:      Patient says he needs to t alk to [facility] about his upcoming procedure. He wants to know if his insurance approved it and have it been scheduled?

## 2022-02-19 NOTE — Telephone Encounter (Signed)
Returned call to pt ?Informed that I haven't scheduled procedure yet but will by tomorrow. ?Aware that it is still scheduled for 3/14. ?Instructions will be sent via mychart. ? ?CPT codes given to pt so that he may check with insurance that this is covered. ? ?

## 2022-02-25 ENCOUNTER — Other Ambulatory Visit (HOSPITAL_COMMUNITY): Payer: Self-pay

## 2022-02-25 DIAGNOSIS — I4901 Ventricular fibrillation: Secondary | ICD-10-CM | POA: Diagnosis not present

## 2022-02-25 DIAGNOSIS — I469 Cardiac arrest, cause unspecified: Secondary | ICD-10-CM | POA: Diagnosis not present

## 2022-02-27 ENCOUNTER — Telehealth: Payer: Self-pay

## 2022-02-27 ENCOUNTER — Other Ambulatory Visit: Payer: Self-pay

## 2022-02-27 ENCOUNTER — Other Ambulatory Visit: Payer: Medicare Other | Admitting: *Deleted

## 2022-02-27 ENCOUNTER — Other Ambulatory Visit: Payer: Self-pay | Admitting: *Deleted

## 2022-02-27 DIAGNOSIS — I5022 Chronic systolic (congestive) heart failure: Secondary | ICD-10-CM | POA: Diagnosis not present

## 2022-02-27 DIAGNOSIS — Z01812 Encounter for preprocedural laboratory examination: Secondary | ICD-10-CM | POA: Diagnosis not present

## 2022-02-27 DIAGNOSIS — I472 Ventricular tachycardia, unspecified: Secondary | ICD-10-CM

## 2022-02-27 NOTE — Telephone Encounter (Signed)
Patient states that his signature and date was cut off on papers that were faxed over to pt assistance... pt would like to know if the signature page can be resent... application cannot be processed without it  ?

## 2022-02-27 NOTE — Telephone Encounter (Signed)
Late entry: ?Faxed last week, 3/6.  Confirmation received ?

## 2022-02-28 LAB — CBC
Hematocrit: 45 % (ref 37.5–51.0)
Hemoglobin: 14.9 g/dL (ref 13.0–17.7)
MCH: 30.3 pg (ref 26.6–33.0)
MCHC: 33.1 g/dL (ref 31.5–35.7)
MCV: 92 fL (ref 79–97)
Platelets: 256 10*3/uL (ref 150–450)
RBC: 4.92 x10E6/uL (ref 4.14–5.80)
RDW: 12 % (ref 11.6–15.4)
WBC: 8.1 10*3/uL (ref 3.4–10.8)

## 2022-02-28 LAB — BASIC METABOLIC PANEL
BUN/Creatinine Ratio: 10 (ref 10–24)
BUN: 16 mg/dL (ref 8–27)
CO2: 21 mmol/L (ref 20–29)
Calcium: 10.9 mg/dL — ABNORMAL HIGH (ref 8.6–10.2)
Chloride: 107 mmol/L — ABNORMAL HIGH (ref 96–106)
Creatinine, Ser: 1.61 mg/dL — ABNORMAL HIGH (ref 0.76–1.27)
Glucose: 130 mg/dL — ABNORMAL HIGH (ref 70–99)
Potassium: 4.3 mmol/L (ref 3.5–5.2)
Sodium: 146 mmol/L — ABNORMAL HIGH (ref 134–144)
eGFR: 47 mL/min/{1.73_m2} — ABNORMAL LOW (ref >59)

## 2022-03-02 NOTE — Telephone Encounter (Signed)
**Note De-Identified  Obfuscation** I called [employer] and s/w Anderson Malta who advised me that she checked this pts application and that they have all of it. ?Anderson Malta states that she is sending his application to processing now. ? ?No answer on the pts phone so I left a VM message advising him that per Anderson Malta at [facility] they do have is complete application and will fax [place] their determination once reached and will mail he pt a letter. ?I did leave my name and the office phone number in the VM message so he can call me back if he has any questions. ?

## 2022-03-02 NOTE — Pre-Procedure Instructions (Signed)
Attempted to call patient, no answer 

## 2022-03-02 NOTE — Telephone Encounter (Signed)
**Note De-Identified  Obfuscation** I was able to reach the pt and I have made him aware that [employer] does have his entire application and that it has been sent to their processing dept this morning. ?He thanked me for calling back. ?

## 2022-03-03 ENCOUNTER — Ambulatory Visit (HOSPITAL_COMMUNITY)
Admission: RE | Disposition: A | Discharge status: Home / Self Care | Payer: Self-pay | Source: Home / Self Care | Attending: Cardiology

## 2022-03-03 ENCOUNTER — Other Ambulatory Visit: Payer: Self-pay

## 2022-03-03 ENCOUNTER — Ambulatory Visit (HOSPITAL_COMMUNITY): Payer: Medicare Other

## 2022-03-03 ENCOUNTER — Ambulatory Visit (HOSPITAL_COMMUNITY)
Admission: RE | Admit: 2022-03-03 | Discharge: 2022-03-03 | Disposition: A | Discharge status: Home / Self Care | Payer: Medicare Other | Attending: Cardiology | Admitting: Cardiology

## 2022-03-03 DIAGNOSIS — J449 Chronic obstructive pulmonary disease, unspecified: Secondary | ICD-10-CM | POA: Insufficient documentation

## 2022-03-03 DIAGNOSIS — E119 Type 2 diabetes mellitus without complications: Secondary | ICD-10-CM | POA: Diagnosis not present

## 2022-03-03 DIAGNOSIS — I2510 Atherosclerotic heart disease of native coronary artery without angina pectoris: Secondary | ICD-10-CM | POA: Diagnosis not present

## 2022-03-03 DIAGNOSIS — Z79899 Other long term (current) drug therapy: Secondary | ICD-10-CM | POA: Diagnosis not present

## 2022-03-03 DIAGNOSIS — I4821 Permanent atrial fibrillation: Secondary | ICD-10-CM | POA: Diagnosis not present

## 2022-03-03 DIAGNOSIS — I5022 Chronic systolic (congestive) heart failure: Secondary | ICD-10-CM | POA: Insufficient documentation

## 2022-03-03 DIAGNOSIS — Z951 Presence of aortocoronary bypass graft: Secondary | ICD-10-CM | POA: Insufficient documentation

## 2022-03-03 DIAGNOSIS — I472 Ventricular tachycardia, unspecified: Secondary | ICD-10-CM | POA: Insufficient documentation

## 2022-03-03 DIAGNOSIS — Z7982 Long term (current) use of aspirin: Secondary | ICD-10-CM | POA: Insufficient documentation

## 2022-03-03 DIAGNOSIS — Z4502 Encounter for adjustment and management of automatic implantable cardiac defibrillator: Secondary | ICD-10-CM | POA: Insufficient documentation

## 2022-03-03 DIAGNOSIS — I5042 Chronic combined systolic (congestive) and diastolic (congestive) heart failure: Secondary | ICD-10-CM | POA: Diagnosis present

## 2022-03-03 DIAGNOSIS — Z7901 Long term (current) use of anticoagulants: Secondary | ICD-10-CM | POA: Diagnosis not present

## 2022-03-03 DIAGNOSIS — Z8674 Personal history of sudden cardiac arrest: Secondary | ICD-10-CM | POA: Diagnosis not present

## 2022-03-03 DIAGNOSIS — Z7984 Long term (current) use of oral hypoglycemic drugs: Secondary | ICD-10-CM | POA: Insufficient documentation

## 2022-03-03 DIAGNOSIS — I428 Other cardiomyopathies: Secondary | ICD-10-CM | POA: Insufficient documentation

## 2022-03-03 DIAGNOSIS — I110 Hypertensive heart disease with heart failure: Secondary | ICD-10-CM | POA: Insufficient documentation

## 2022-03-03 DIAGNOSIS — Z95810 Presence of automatic (implantable) cardiac defibrillator: Secondary | ICD-10-CM | POA: Diagnosis not present

## 2022-03-03 DIAGNOSIS — E669 Obesity, unspecified: Secondary | ICD-10-CM | POA: Insufficient documentation

## 2022-03-03 DIAGNOSIS — Z87891 Personal history of nicotine dependence: Secondary | ICD-10-CM | POA: Insufficient documentation

## 2022-03-03 DIAGNOSIS — Z95818 Presence of other cardiac implants and grafts: Secondary | ICD-10-CM

## 2022-03-03 DIAGNOSIS — Z6833 Body mass index (BMI) 33.0-33.9, adult: Secondary | ICD-10-CM | POA: Diagnosis not present

## 2022-03-03 DIAGNOSIS — I469 Cardiac arrest, cause unspecified: Secondary | ICD-10-CM | POA: Diagnosis present

## 2022-03-03 LAB — GLUCOSE, CAPILLARY: Glucose-Capillary: 85 mg/dL (ref 70–99)

## 2022-03-03 SURGERY — BIV ICD INSERTION CRT-D

## 2022-03-03 MED ORDER — LIDOCAINE HCL 1 % IJ SOLN
INTRAMUSCULAR | Status: AC
  Filled 2022-03-03: qty 20

## 2022-03-03 MED ORDER — ONDANSETRON HCL 4 MG/2ML IJ SOLN: 4.0000 mg | Freq: Four times a day (QID) | INTRAMUSCULAR | Status: DC | PRN

## 2022-03-03 MED ORDER — FENTANYL CITRATE (PF) 100 MCG/2ML IJ SOLN
INTRAMUSCULAR | Status: AC
  Filled 2022-03-03: qty 2

## 2022-03-03 MED ORDER — CEFAZOLIN SODIUM-DEXTROSE 1-4 GM/50ML-% IV SOLN
1.0000 g | Freq: Four times a day (QID) | INTRAVENOUS | Status: DC
  Administered 2022-03-03: 1 g via INTRAVENOUS
  Filled 2022-03-03: qty 50

## 2022-03-03 MED ORDER — CEFAZOLIN SODIUM-DEXTROSE 2-4 GM/100ML-% IV SOLN
2.0000 g | INTRAVENOUS | Status: AC
  Administered 2022-03-03: 2 g via INTRAVENOUS

## 2022-03-03 MED ORDER — ACETAMINOPHEN 325 MG PO TABS
325.0000 mg | ORAL_TABLET | ORAL | Status: DC | PRN
  Filled 2022-03-03: qty 2

## 2022-03-03 MED ORDER — MIDAZOLAM HCL 5 MG/5ML IJ SOLN
INTRAMUSCULAR | Status: AC
  Filled 2022-03-03: qty 5

## 2022-03-03 MED ORDER — SODIUM CHLORIDE 0.9 % IV SOLN: INTRAVENOUS | Status: DC

## 2022-03-03 MED ORDER — CEFAZOLIN SODIUM-DEXTROSE 2-4 GM/100ML-% IV SOLN
INTRAVENOUS | Status: AC
  Filled 2022-03-03: qty 100

## 2022-03-03 MED ORDER — FENTANYL CITRATE (PF) 100 MCG/2ML IJ SOLN
INTRAMUSCULAR | Status: DC | PRN
  Administered 2022-03-03 (×2): 12.5 ug via INTRAVENOUS
  Administered 2022-03-03 (×2): 25 ug via INTRAVENOUS
  Administered 2022-03-03: 12.5 ug via INTRAVENOUS
  Administered 2022-03-03: 25 ug via INTRAVENOUS
  Administered 2022-03-03 (×2): 12.5 ug via INTRAVENOUS

## 2022-03-03 MED ORDER — CHLORHEXIDINE GLUCONATE 4 % EX LIQD
4.0000 "application " | Freq: Once | CUTANEOUS | Status: DC
  Filled 2022-03-03: qty 60

## 2022-03-03 MED ORDER — SODIUM CHLORIDE 0.9 % IV SOLN
80.0000 mg | INTRAVENOUS | Status: AC
  Administered 2022-03-03: 80 mg

## 2022-03-03 MED ORDER — IOHEXOL 350 MG/ML SOLN
INTRAVENOUS | Status: DC | PRN
  Administered 2022-03-03: 15 mL

## 2022-03-03 MED ORDER — HEPARIN (PORCINE) IN NACL 1000-0.9 UT/500ML-% IV SOLN
INTRAVENOUS | Status: AC
  Filled 2022-03-03: qty 500

## 2022-03-03 MED ORDER — MIDAZOLAM HCL 5 MG/5ML IJ SOLN
INTRAMUSCULAR | Status: DC | PRN
  Administered 2022-03-03 (×8): 1 mg via INTRAVENOUS

## 2022-03-03 MED ORDER — SODIUM CHLORIDE 0.9 % IV SOLN
INTRAVENOUS | Status: AC
  Filled 2022-03-03: qty 2

## 2022-03-03 MED ORDER — HEPARIN (PORCINE) IN NACL 1000-0.9 UT/500ML-% IV SOLN
INTRAVENOUS | Status: DC | PRN
  Administered 2022-03-03: 500 mL

## 2022-03-03 MED ORDER — LIDOCAINE HCL (PF) 1 % IJ SOLN
INTRAMUSCULAR | Status: DC | PRN
  Administered 2022-03-03: 20 mL
  Administered 2022-03-03: 60 mL
  Administered 2022-03-03: 20 mL

## 2022-03-03 SURGICAL SUPPLY — 21 items
CABLE SURGICAL S-101-97-12 (CABLE) ×2 IMPLANT
CATH ATTAIN COM SURV 6250V-MB2 (CATHETERS) ×1 IMPLANT
CATH ATTAIN COM SURV 6250V-MPR (CATHETERS) ×1 IMPLANT
CATH ATTAIN SEL SURV 6248V-130 (CATHETERS) ×1 IMPLANT
CATH ATTAIN SEL SURV 6248V-90 (CATHETERS) ×1 IMPLANT
CATH INFINITI 6F AL2 (CATHETERS) ×1 IMPLANT
CATH JOSEPH QUAD ALLRED 6F REP (CATHETERS) ×1 IMPLANT
CATH RIGHTSITE C315HIS02 (CATHETERS) ×1 IMPLANT
GUIDEWIRE SAFE TJ AMPLATZ EXST (WIRE) ×1 IMPLANT
ICD CLARIA MRI DTMA1D4 (ICD Generator) ×1 IMPLANT
LEAD SELECT SECURE 3830 383069 (Lead) IMPLANT
LEAD SPRINT QUAT SEC 6935M-62 (Lead) ×1 IMPLANT
MAT PREVALON FULL STRYKER (MISCELLANEOUS) ×1 IMPLANT
PAD DEFIB RADIO PHYSIO CONN (PAD) ×2 IMPLANT
PIN PLUG IS-1 DEFIB (PIN) ×1 IMPLANT
SELECT SECURE 3830 383069 (Lead) ×2 IMPLANT
SHEATH 9.5FR PRELUDE SNAP 13 (SHEATH) ×1 IMPLANT
SHEATH 9FR PRELUDE SNAP 13 (SHEATH) ×1 IMPLANT
SLITTER 6232ADJ (MISCELLANEOUS) ×1 IMPLANT
TRAY PACEMAKER INSERTION (PACKS) ×2 IMPLANT
WIRE HI TORQ VERSACORE-J 145CM (WIRE) ×1 IMPLANT

## 2022-03-03 NOTE — Interval H&P Note (Signed)
History and Physical Interval Note: ? ?03/03/2022 ?12:10 PM ? ?Ross [name]  has presented today for surgery, with the diagnosis of heart failure.  The various methods of treatment have been discussed with the patient and family. After consideration of risks, benefits and other options for treatment, the patient has consented to  Procedure(s): ?UPGRADE TO BIV ICD INSERTION CRT-D (N/A) as a surgical intervention.  The patient's history has been reviewed, patient examined, no change in status, stable for surgery.  I have reviewed the patient's chart and labs.  Questions were answered to the patient's satisfaction.   ? ? ?Ross [name] ? ?ICD Criteria ? ?Current LVEF:35-40%. Within 12 months prior to implant: Yes  ? ?Heart failure history: Yes, Class II ? ?Cardiomyopathy history: Yes, Mixed Ischemic and Non-Ischemic Cardiomyopathies. ? ?Atrial Fibrillation/Atrial Flutter: Yes, Permanent. ? ?Ventricular tachycardia history: Yes, Hemodynamic instability present. VT Type is unknown. ? ?Cardiac arrest history: Yes, Ventricular Fibrillation. ? ?History of syndromes with risk of sudden death: No. ? ?Previous ICD: No. ? ?Current ICD indication: Secondary ? ?PPM indication: Yes. Pacing type: Ventricular. Greater than 40% RV pacing requirement anticipated. Indication: 2:1 AV Block ? ?Class I or II Bradycardia indication present: Yes ? ?Beta Blocker therapy for 3 or more months: Yes, prescribed.  ? ?Ace Inhibitor/ARB therapy for 3 or more months: Yes, prescribed.  ? ? ?I have seen Ross [name] is a 66 y.o. malepre-procedural and has been referred by Ross [name] for consideration of ICD implant for secondary prevention of sudden death.  The patient's chart has been reviewed and they meet criteria for ICD implant.  I have had a thorough discussion with the patient reviewing options.  The patient and their family (if available) have had opportunities to ask questions and have them answered. The patient and I have decided  together through the [place] [employer] to implaht ICD at this time.  Risks, benefits, alternatives to ICD implantation were discussed in detail with the patient today. The patient  understands that the risks include but are not limited to bleeding, infection, pneumothorax, perforation, tamponade, vascular damage, renal failure, MI, stroke, death, inappropriate shocks, and lead dislodgement and wishes to proceed.   ?

## 2022-03-03 NOTE — Discharge Instructions (Addendum)
? ? ?  Supplemental Discharge Instructions for  ?Pacemaker/Defibrillator Patients ? ?Tomorrow, 03/04/22, send in a device transmission ? ?Activity ?No heavy lifting or vigorous activity with your right arm for 6 to 8 weeks.  Do not raise your right arm above your head for one week.  Gradually raise your affected arm as drawn below. ? ?        ?   03/08/22                     03/09/22                    03/10/22                  03/11/22 ?__ ? ?NO DRIVING funtil cleared to at your wound check visit ? ?WOUND CARE ?Keep the wound area clean and dry.  Do not get this area wet , no showers until cleared to at your wound check visit . ?Tomorrow, 03/04/22, remove the arm sling ?Tomorrow, 03/04/22 remove the LARGE outer plastic bandage.  Underneath the plastic bandage there are steri strips (paper tapes), DO NOT remove these. ?The tape/steri-strips on your wound will fall off; do not pull them off.  No bandage is needed on the site.  DO  NOT apply any creams, oils, or ointments to the wound area. ?If you notice any drainage or discharge from the wound, any swelling or bruising at the site, or you develop a fever > 101? F after you are discharged home, call the office at once. ? ?Special Instructions ?You are still able to use cellular telephones; use the ear opposite the side where you have your pacemaker/defibrillator.  Avoid carrying your cellular phone near your device. ?When traveling through airports, show security personnel your identification card to avoid being screened in the metal detectors.  Ask the security personnel to use the hand wand. ?Avoid arc welding equipment, MRI testing (magnetic resonance imaging), TENS units (transcutaneous nerve stimulators).  Call the office for questions about other devices. ?Avoid electrical appliances that are in poor condition or are not properly grounded. ?Microwave ovens are safe to be near or to operate. ? ?Additional information for defibrillator patients should your device go  off: ?If your device goes off ONCE and you feel fine afterward, notify the device clinic nurses. ?If your device goes off ONCE and you do not feel well afterward, call 911. ?If your device goes off TWICE, call 911. ?If your device goes off THREE times in one day, call 911. ? ?DO NOT DRIVE YOURSELF OR A FAMILY MEMBER ?WITH A DEFIBRILLATOR TO THE HOSPITAL--CALL 911. ? ?

## 2022-03-04 ENCOUNTER — Telehealth: Payer: Self-pay

## 2022-03-04 ENCOUNTER — Encounter (HOSPITAL_COMMUNITY): Payer: Self-pay | Admitting: Cardiology

## 2022-03-04 MED FILL — Lidocaine HCl Local Inj 1%: INTRAMUSCULAR | Qty: 100 | Status: AC

## 2022-03-04 NOTE — Telephone Encounter (Signed)
-----   Message from [place], [place] sent at 03/03/2022  5:14 PM EDT ----- ?Same day d/c ? ?MDT  ? ?CRT-D (new R sided implant), left bundle position and RV apex ? ?WC ? ?

## 2022-03-04 NOTE — Telephone Encounter (Signed)
Follow-up after same day discharge: ?Implant date: 03/03/22 ?MD: Allegra Lai, MD ?Device: CRT-D, left bundle and RV apex leads ?Location: Right side ? ? ?Wound check visit: 03/18/22 at 10:40 ?90 day MD follow-up: 06/16/22 at 2:30 ? ?Remote Transmission received:no. Patient advised to please plug in monitor and send remote ? ?Dressing removed: No. Patient advised to remove as soon as he returns home. ? ?Successful telephone encounter to patient. Patient is currently not home to send remote. Does not endorse swelling at site. Minimal pain. Encouraged to remove sling. Patient concerned that his resting heart rate is only 60. Advised patient that at last in clinic check he was dependent on his device. VS at 30. LRL 60 on device prior to upgrade. Appointments confirmed. Arm restrictions reinforced. Patient is provided device clinic contact for additional questions or concerns.   ?

## 2022-03-05 DIAGNOSIS — J449 Chronic obstructive pulmonary disease, unspecified: Secondary | ICD-10-CM | POA: Diagnosis not present

## 2022-03-05 DIAGNOSIS — I10 Essential (primary) hypertension: Secondary | ICD-10-CM | POA: Diagnosis not present

## 2022-03-05 DIAGNOSIS — E1140 Type 2 diabetes mellitus with diabetic neuropathy, unspecified: Secondary | ICD-10-CM | POA: Diagnosis not present

## 2022-03-05 DIAGNOSIS — I5042 Chronic combined systolic (congestive) and diastolic (congestive) heart failure: Secondary | ICD-10-CM | POA: Diagnosis not present

## 2022-03-05 NOTE — Telephone Encounter (Signed)
Spoke with patient informed him his transmission was normal, he asked why his heart rate was always 60 informed him that this is what his lower rate was set at and that when he gets up and moves around such as walking should increase some patient has a pulse oximeter informed patient to wear that on his finger while walking around and he should see his heart rate go up and if it doesn't then we can adjust his device such that his heart rate does go up with exercise.  ?

## 2022-03-05 NOTE — Telephone Encounter (Addendum)
Pt would like for the nurse to call him with transmission results. Transmission received 03-05-2022 ?

## 2022-03-06 ENCOUNTER — Encounter: Payer: Medicare Other | Admitting: Cardiology

## 2022-03-12 ENCOUNTER — Other Ambulatory Visit: Payer: Self-pay | Admitting: Interventional Cardiology

## 2022-03-12 ENCOUNTER — Other Ambulatory Visit (HOSPITAL_COMMUNITY): Payer: Self-pay

## 2022-03-12 MED ORDER — ISOSORBIDE DINITRATE 20 MG PO TABS
20.0000 mg | ORAL_TABLET | Freq: Three times a day (TID) | ORAL | 2 refills | Status: DC
Start: 2022-03-12 — End: 2022-12-04
  Filled 2022-03-12: qty 90, 30d supply, fill #0
  Filled 2022-04-07: qty 90, 30d supply, fill #1
  Filled 2022-05-05: qty 90, 30d supply, fill #2
  Filled 2022-06-09: qty 90, 30d supply, fill #3
  Filled 2022-07-08: qty 90, 30d supply, fill #4
  Filled 2022-08-10: qty 90, 30d supply, fill #5
  Filled 2022-09-09: qty 90, 30d supply, fill #6
  Filled 2022-10-08: qty 90, 30d supply, fill #7
  Filled 2022-11-09: qty 90, 30d supply, fill #8

## 2022-03-12 MED ORDER — ALPRAZOLAM 0.5 MG PO TABS
0.5000 mg | ORAL_TABLET | Freq: Every evening | ORAL | 0 refills | Status: DC
  Filled 2022-03-17: qty 30, 30d supply, fill #0

## 2022-03-12 MED ORDER — PANTOPRAZOLE SODIUM 40 MG PO TBEC
40.0000 mg | DELAYED_RELEASE_TABLET | Freq: Every day | ORAL | 0 refills | Status: DC
  Filled 2022-03-12: qty 30, 30d supply, fill #0

## 2022-03-12 MED ORDER — HYDRALAZINE HCL 50 MG PO TABS
50.0000 mg | ORAL_TABLET | Freq: Three times a day (TID) | ORAL | 2 refills | Status: DC
  Filled 2022-03-12: qty 90, 30d supply, fill #0
  Filled 2022-04-07: qty 90, 30d supply, fill #1
  Filled 2022-05-05: qty 90, 30d supply, fill #2

## 2022-03-12 NOTE — Telephone Encounter (Addendum)
**Note De-Identified Sanjana Folz Obfuscation** Letter received Argyle Gustafson fax from [facility] stating that they denied Eliquis asst for the pt. ?Reason: ?Documentation of 3% out of pocket RX expenses, based on the households adjusted gross income, not met. ? ?The letter states that they have notified the pt of this denial as well.  ?

## 2022-03-16 ENCOUNTER — Other Ambulatory Visit (HOSPITAL_COMMUNITY): Payer: Self-pay

## 2022-03-17 ENCOUNTER — Other Ambulatory Visit (HOSPITAL_COMMUNITY): Payer: Self-pay

## 2022-03-18 ENCOUNTER — Other Ambulatory Visit: Payer: Self-pay

## 2022-03-18 ENCOUNTER — Other Ambulatory Visit (HOSPITAL_COMMUNITY): Payer: Self-pay

## 2022-03-18 ENCOUNTER — Ambulatory Visit (INDEPENDENT_AMBULATORY_CARE_PROVIDER_SITE_OTHER): Payer: Medicare Other

## 2022-03-18 DIAGNOSIS — I469 Cardiac arrest, cause unspecified: Secondary | ICD-10-CM

## 2022-03-18 DIAGNOSIS — I255 Ischemic cardiomyopathy: Secondary | ICD-10-CM | POA: Diagnosis not present

## 2022-03-18 LAB — CUP PACEART INCLINIC DEVICE CHECK
Battery Remaining Longevity: 113 mo
Battery Voltage: 3.11 V
Brady Statistic AP VP Percent: 0 %
Brady Statistic AP VS Percent: 0 %
Brady Statistic AS VP Percent: 0 %
Brady Statistic AS VS Percent: 0 %
Brady Statistic RA Percent Paced: 0 %
Brady Statistic RV Percent Paced: 99.64 %
Date Time Interrogation Session: 20230329160345
HighPow Impedance: 62 Ohm
Implantable Lead Implant Date: 20230314
Implantable Lead Implant Date: 20230314
Implantable Lead Location: 753860
Implantable Lead Location: 753860
Implantable Lead Model: 3830
Implantable Pulse Generator Implant Date: 20230314
Lead Channel Impedance Value: 247 Ohm
Lead Channel Impedance Value: 285 Ohm
Lead Channel Impedance Value: 399 Ohm
Lead Channel Impedance Value: 399 Ohm
Lead Channel Impedance Value: 4047 Ohm
Lead Channel Impedance Value: 532 Ohm
Lead Channel Pacing Threshold Amplitude: 0.5 V
Lead Channel Pacing Threshold Amplitude: 1 V
Lead Channel Pacing Threshold Pulse Width: 0.4 ms
Lead Channel Pacing Threshold Pulse Width: 0.4 ms
Lead Channel Sensing Intrinsic Amplitude: 4.125 mV
Lead Channel Sensing Intrinsic Amplitude: 5.875 mV
Lead Channel Setting Pacing Amplitude: 0.5 V
Lead Channel Setting Pacing Amplitude: 2 V
Lead Channel Setting Pacing Pulse Width: 0.03 ms
Lead Channel Setting Pacing Pulse Width: 0.4 ms
Lead Channel Setting Sensing Sensitivity: 0.3 mV

## 2022-03-18 NOTE — Progress Notes (Signed)
Wound check Steri-strips removed. Wound without redness or edema. Incision edges approximated, wound well healed.CRT-D device check in office. Thresholds and sensing consistent with previous device measurements. Lead impedance trends stable over time. No ventricular arrhythmia episodes recorded. LV only pacing. Device programmed with appropriate safety margins. Heart failure diagnostics reviewed and trends are stable for patient. Audible/vibratory alerts demonstrated for patient. No changes made this session. Estimated longevity 9.4 years.  Patient educated about wound care, arm mobility and lifting restrictions. ROV with WC 06/16/22 ?

## 2022-03-18 NOTE — Patient Instructions (Signed)
? ?  After Your ICD ?(Implantable Cardiac Defibrillator) ? ? ? ?Monitor your defibrillator site for redness, swelling, and drainage. Call the device clinic at [phone] if you experience these symptoms or fever/chills. ? ?Your incision was closed with Steri-strips or staples:  You may shower 7 days after your procedure and wash your incision with soap and water. Avoid lotions, ointments, or perfumes over your incision until it is well-healed. ? ?You may use a hot tub after April 14, 2022 ? ?Do not lift, push or pull greater than 10 pounds with the affected arm until after April 14, 2022 There are no other restrictions in arm movement after your wound check appointment. ? ? ?Your ICD is designed to protect you from life threatening heart rhythms. Because of this, you may receive a shock.  ? ?1 shock with no symptoms:  Call the office during business hours. ?1 shock with symptoms (chest pain, chest pressure, dizziness, lightheadedness, shortness of breath, overall feeling unwell):  Call 911. ?If you experience 2 or more shocks in 24 hours:  Call 911. ?If you receive a shock, you should not drive.  ?[place] DMV - no driving for 6 months if you receive appropriate therapy from your ICD.  ? ? ?Remote monitoring is used to monitor your ICD from home. This monitoring is scheduled every 91 days by our office. It allows [place] to keep an eye on the functioning of your device to ensure it is working properly. You will routinely see your Electrophysiologist annually (more often if necessary).  ?
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
## 2022-03-23 ENCOUNTER — Encounter: Payer: Self-pay | Admitting: Interventional Cardiology

## 2022-03-23 ENCOUNTER — Ambulatory Visit: Payer: Medicare Other | Admitting: Interventional Cardiology

## 2022-03-23 VITALS — BP 102/58 | HR 68 | Ht 73.0 in | Wt 262.0 lb

## 2022-03-23 DIAGNOSIS — I10 Essential (primary) hypertension: Secondary | ICD-10-CM | POA: Diagnosis not present

## 2022-03-23 DIAGNOSIS — I4821 Permanent atrial fibrillation: Secondary | ICD-10-CM

## 2022-03-23 DIAGNOSIS — M79604 Pain in right leg: Secondary | ICD-10-CM

## 2022-03-23 DIAGNOSIS — I25709 Atherosclerosis of coronary artery bypass graft(s), unspecified, with unspecified angina pectoris: Secondary | ICD-10-CM | POA: Diagnosis not present

## 2022-03-23 DIAGNOSIS — I469 Cardiac arrest, cause unspecified: Secondary | ICD-10-CM | POA: Diagnosis not present

## 2022-03-23 DIAGNOSIS — Z950 Presence of cardiac pacemaker: Secondary | ICD-10-CM | POA: Diagnosis not present

## 2022-03-23 DIAGNOSIS — M79605 Pain in left leg: Secondary | ICD-10-CM | POA: Diagnosis not present

## 2022-03-23 DIAGNOSIS — I5022 Chronic systolic (congestive) heart failure: Secondary | ICD-10-CM

## 2022-03-23 NOTE — Patient Instructions (Signed)
Medication Instructions:  ?Your physician recommends that you continue on your current medications as directed. Please refer to the Current Medication list given to you today. ? ?*If you need a refill on your cardiac medications before your next appointment, please call your pharmacy* ? ? ?Lab Work: ?None ?If you have labs (blood work) drawn today and your tests are completely normal, you will receive your results only by: ?MyChart Message (if you have MyChart) OR ?A paper copy in the mail ?If you have any lab test that is abnormal or we need to change your treatment, we will call you to review the results. ? ? ?Testing/Procedures: ?Your physician has requested that you have a lower or upper extremity arterial duplex. This test is an ultrasound of the arteries in the legs or arms. It looks at arterial blood flow in the legs and arms. Allow one hour for Lower and Upper Arterial scans. There are no restrictions or special instructions ? ? ?Follow-Up: ?At [facility], you and your health needs are our priority.  As part of our continuing mission to provide you with exceptional heart care, we have created designated Provider Care Teams.  These Care Teams include your primary Cardiologist (physician) and Advanced Practice Providers (APPs -  Physician Assistants and Nurse Practitioners) who all work together to provide you with the care you need, when you need it. ? ?We recommend signing up for the patient portal called "MyChart".  Sign up information is provided on this After Visit Summary.  MyChart is used to connect with patients for Virtual Visits (Telemedicine).  Patients are able to view lab/test results, encounter notes, upcoming appointments, etc.  Non-urgent messages can be sent to your provider as well.   ?To learn more about what you can do with MyChart, go to [email or web address].   ? ?Your next appointment:   ?6 month(s) ? ?The format for your next appointment:   ?In Person ? ?Provider:   ?Sinclair Grooms, MD  ? ? ?Other Instructions ? ?You have been referred to Cardiac Rehab.  They will contact you to get you scheduled.    ?

## 2022-03-25 ENCOUNTER — Encounter (HOSPITAL_COMMUNITY): Payer: Self-pay

## 2022-03-25 ENCOUNTER — Telehealth (HOSPITAL_COMMUNITY): Payer: Self-pay

## 2022-03-25 NOTE — Telephone Encounter (Signed)
Pt insurance is active and benefits verified through [facility] [employer]. Co-pay $0.00, DED $0.00/$0.00 met, out of pocket $3,600.00/$2,037.97 met, co-insurance 0%. No pre-authorization required. Passport, 03/25/22 @ 9:26AM, [id number] ?  ?Will contact patient to see if he is interested in the Cardiac Rehab Program.  ?

## 2022-03-25 NOTE — Telephone Encounter (Signed)
Attempted to call patient in regards to Cardiac Rehab - LM on VM Mailed letter 

## 2022-03-26 ENCOUNTER — Telehealth (HOSPITAL_COMMUNITY): Payer: Self-pay

## 2022-03-26 NOTE — Telephone Encounter (Signed)
Pt returned CR phone call and stated he is interested in CR. Explained scheduling process and went over insurance, patient verbalized understanding. Will contact patient for scheduling once RN review. ?

## 2022-03-30 ENCOUNTER — Other Ambulatory Visit: Payer: Self-pay | Admitting: Interventional Cardiology

## 2022-03-30 DIAGNOSIS — M79604 Pain in right leg: Secondary | ICD-10-CM

## 2022-03-31 ENCOUNTER — Other Ambulatory Visit (HOSPITAL_COMMUNITY): Payer: Self-pay

## 2022-03-31 DIAGNOSIS — E213 Hyperparathyroidism, unspecified: Secondary | ICD-10-CM | POA: Diagnosis not present

## 2022-03-31 DIAGNOSIS — Z0000 Encounter for general adult medical examination without abnormal findings: Secondary | ICD-10-CM | POA: Diagnosis not present

## 2022-03-31 DIAGNOSIS — D6869 Other thrombophilia: Secondary | ICD-10-CM | POA: Diagnosis not present

## 2022-03-31 DIAGNOSIS — J449 Chronic obstructive pulmonary disease, unspecified: Secondary | ICD-10-CM | POA: Diagnosis not present

## 2022-03-31 DIAGNOSIS — E7800 Pure hypercholesterolemia, unspecified: Secondary | ICD-10-CM | POA: Diagnosis not present

## 2022-03-31 DIAGNOSIS — I10 Essential (primary) hypertension: Secondary | ICD-10-CM | POA: Diagnosis not present

## 2022-03-31 DIAGNOSIS — E1159 Type 2 diabetes mellitus with other circulatory complications: Secondary | ICD-10-CM | POA: Diagnosis not present

## 2022-03-31 DIAGNOSIS — I4891 Unspecified atrial fibrillation: Secondary | ICD-10-CM | POA: Diagnosis not present

## 2022-03-31 DIAGNOSIS — G629 Polyneuropathy, unspecified: Secondary | ICD-10-CM | POA: Diagnosis not present

## 2022-03-31 DIAGNOSIS — I2510 Atherosclerotic heart disease of native coronary artery without angina pectoris: Secondary | ICD-10-CM | POA: Diagnosis not present

## 2022-03-31 DIAGNOSIS — I700 Atherosclerosis of aorta: Secondary | ICD-10-CM | POA: Diagnosis not present

## 2022-03-31 DIAGNOSIS — M170 Bilateral primary osteoarthritis of knee: Secondary | ICD-10-CM | POA: Diagnosis not present

## 2022-03-31 MED ORDER — GABAPENTIN 300 MG PO CAPS
300.0000 mg | ORAL_CAPSULE | Freq: Two times a day (BID) | ORAL | 5 refills | Status: DC
  Filled 2022-04-02 – 2022-04-07 (×2): qty 60, 30d supply, fill #0
  Filled 2022-05-05: qty 60, 30d supply, fill #1
  Filled 2022-06-09: qty 60, 30d supply, fill #2
  Filled 2022-07-08: qty 60, 30d supply, fill #3
  Filled 2022-08-10: qty 60, 30d supply, fill #4
  Filled 2022-09-09: qty 60, 30d supply, fill #5

## 2022-03-31 MED ORDER — ALPRAZOLAM 0.5 MG PO TABS
0.5000 mg | ORAL_TABLET | Freq: Every day | ORAL | 0 refills | Status: DC
  Filled 2022-04-02: qty 62, 31d supply, fill #0
  Filled 2022-04-07: qty 60, 30d supply, fill #0

## 2022-04-02 ENCOUNTER — Other Ambulatory Visit (HOSPITAL_COMMUNITY): Payer: Self-pay

## 2022-04-03 ENCOUNTER — Encounter (HOSPITAL_COMMUNITY): Payer: Self-pay | Admitting: *Deleted

## 2022-04-03 NOTE — Progress Notes (Signed)
Received referral from Dr. Tamala Julian for this pt to participate in Cardiac rehab with the diagnosis of chronic stable angina.  Pt with prior CABG in 2007.  Pt Vfib arrest 05/2021.  ICD placed 02/2022.  Pt cath showed severe CAD with no targets - plan to treat medically. Clinical review of pt follow up appt on 4/3 with Dr. Tamala Julian - cardiologist office note. Pt is making the expected progress in recovery.  Pt appropriate for scheduling for on site cardiac rehab and/or enrollment in Virtual Cardiac Rehab.  Will forward to staff for scheduling when able as there is a wait list. Maurice Small RN, BSN ?Cardiac and Pulmonary Rehab Nurse Navigator  ? ?

## 2022-04-07 ENCOUNTER — Other Ambulatory Visit (HOSPITAL_COMMUNITY): Payer: Self-pay

## 2022-04-07 MED ORDER — PANTOPRAZOLE SODIUM 40 MG PO TBEC
40.0000 mg | DELAYED_RELEASE_TABLET | Freq: Every day | ORAL | 0 refills | Status: DC
  Filled 2022-04-07: qty 30, 30d supply, fill #0

## 2022-04-09 ENCOUNTER — Other Ambulatory Visit (HOSPITAL_COMMUNITY): Payer: Self-pay

## 2022-04-09 ENCOUNTER — Ambulatory Visit (HOSPITAL_COMMUNITY)
Admission: RE | Admit: 2022-04-09 | Discharge: 2022-04-09 | Disposition: A | Discharge status: Home / Self Care | Payer: Medicare Other | Source: Ambulatory Visit | Attending: Cardiology | Admitting: Cardiology

## 2022-04-09 DIAGNOSIS — M79605 Pain in left leg: Secondary | ICD-10-CM

## 2022-04-09 DIAGNOSIS — M79604 Pain in right leg: Secondary | ICD-10-CM | POA: Insufficient documentation

## 2022-04-10 ENCOUNTER — Telehealth: Payer: Self-pay | Admitting: Interventional Cardiology

## 2022-04-10 NOTE — Telephone Encounter (Signed)
Informed pt of results. Pt verbalized understanding. 

## 2022-04-10 NOTE — Telephone Encounter (Signed)
Patient is returning call to discuss VAS [place] LOWER EXT ART SEG MULTI  results. ?

## 2022-04-21 DIAGNOSIS — I5042 Chronic combined systolic (congestive) and diastolic (congestive) heart failure: Secondary | ICD-10-CM | POA: Diagnosis not present

## 2022-04-21 DIAGNOSIS — I4891 Unspecified atrial fibrillation: Secondary | ICD-10-CM | POA: Diagnosis not present

## 2022-04-21 DIAGNOSIS — I119 Hypertensive heart disease without heart failure: Secondary | ICD-10-CM | POA: Diagnosis not present

## 2022-04-21 DIAGNOSIS — E7800 Pure hypercholesterolemia, unspecified: Secondary | ICD-10-CM | POA: Diagnosis not present

## 2022-04-21 DIAGNOSIS — G8929 Other chronic pain: Secondary | ICD-10-CM | POA: Diagnosis not present

## 2022-04-21 DIAGNOSIS — I10 Essential (primary) hypertension: Secondary | ICD-10-CM | POA: Diagnosis not present

## 2022-04-21 DIAGNOSIS — I2510 Atherosclerotic heart disease of native coronary artery without angina pectoris: Secondary | ICD-10-CM | POA: Diagnosis not present

## 2022-04-21 DIAGNOSIS — E1159 Type 2 diabetes mellitus with other circulatory complications: Secondary | ICD-10-CM | POA: Diagnosis not present

## 2022-04-21 DIAGNOSIS — J449 Chronic obstructive pulmonary disease, unspecified: Secondary | ICD-10-CM | POA: Diagnosis not present

## 2022-05-05 ENCOUNTER — Other Ambulatory Visit (HOSPITAL_COMMUNITY): Payer: Self-pay

## 2022-05-05 MED ORDER — PANTOPRAZOLE SODIUM 40 MG PO TBEC
40.0000 mg | DELAYED_RELEASE_TABLET | Freq: Every day | ORAL | 0 refills | Status: DC
  Filled 2022-05-05: qty 30, 30d supply, fill #0

## 2022-05-05 MED ORDER — ALPRAZOLAM 0.5 MG PO TABS
0.5000 mg | ORAL_TABLET | Freq: Every evening | ORAL | 1 refills | Status: DC
  Filled 2022-05-05: qty 62, 31d supply, fill #0
  Filled 2022-06-09: qty 62, 31d supply, fill #1

## 2022-05-06 ENCOUNTER — Other Ambulatory Visit (HOSPITAL_COMMUNITY): Payer: Self-pay

## 2022-05-06 ENCOUNTER — Encounter: Payer: Self-pay | Admitting: Internal Medicine

## 2022-05-06 DIAGNOSIS — R0981 Nasal congestion: Secondary | ICD-10-CM | POA: Diagnosis not present

## 2022-05-06 DIAGNOSIS — R059 Cough, unspecified: Secondary | ICD-10-CM | POA: Diagnosis not present

## 2022-05-06 DIAGNOSIS — J069 Acute upper respiratory infection, unspecified: Secondary | ICD-10-CM | POA: Diagnosis not present

## 2022-05-06 MED ORDER — DOXYCYCLINE HYCLATE 100 MG PO CAPS
100.0000 mg | ORAL_CAPSULE | Freq: Two times a day (BID) | ORAL | 0 refills | Status: DC
  Filled 2022-05-06: qty 20, 10d supply, fill #0

## 2022-05-19 ENCOUNTER — Other Ambulatory Visit: Payer: Self-pay

## 2022-05-22 ENCOUNTER — Encounter (HOSPITAL_COMMUNITY): Payer: Self-pay

## 2022-05-22 ENCOUNTER — Telehealth (HOSPITAL_COMMUNITY): Payer: Self-pay

## 2022-05-22 NOTE — Telephone Encounter (Signed)
Attempted to call patient in regards to Cardiac Rehab - LM on VM Mailed letter 

## 2022-06-01 ENCOUNTER — Ambulatory Visit: Payer: Medicare Other | Admitting: Interventional Cardiology

## 2022-06-02 ENCOUNTER — Encounter (HOSPITAL_COMMUNITY)
Admission: RE | Admit: 2022-06-02 | Discharge: 2022-06-02 | Disposition: A | Discharge status: Home / Self Care | Payer: Medicare Other | Source: Ambulatory Visit | Attending: Interventional Cardiology | Admitting: Interventional Cardiology

## 2022-06-02 ENCOUNTER — Encounter (HOSPITAL_COMMUNITY): Payer: Self-pay

## 2022-06-02 VITALS — BP 98/64 | HR 85 | Ht 72.0 in | Wt 265.7 lb

## 2022-06-02 DIAGNOSIS — I5022 Chronic systolic (congestive) heart failure: Secondary | ICD-10-CM | POA: Diagnosis not present

## 2022-06-02 DIAGNOSIS — Z955 Presence of coronary angioplasty implant and graft: Secondary | ICD-10-CM | POA: Diagnosis not present

## 2022-06-02 DIAGNOSIS — I208 Other forms of angina pectoris: Secondary | ICD-10-CM | POA: Insufficient documentation

## 2022-06-02 DIAGNOSIS — R7303 Prediabetes: Secondary | ICD-10-CM | POA: Insufficient documentation

## 2022-06-02 HISTORY — DX: Hyperlipidemia, unspecified: E78.5

## 2022-06-02 LAB — GLUCOSE, CAPILLARY: Glucose-Capillary: 106 mg/dL — ABNORMAL HIGH (ref 70–99)

## 2022-06-02 NOTE — Progress Notes (Addendum)
Cardiac Individual Treatment Plan  Patient Details  Name: Ross [name] MRN: [id number] Date of Birth: 12-Feb-1956 Referring Provider:   Flowsheet Row INTENSIVE CARDIAC REHAB ORIENT from 06/02/2022 in [facility]  Referring Provider Belva Crome, MD.       Initial Encounter Date:  [place] from 06/02/2022 in [place]  Date 06/02/22       Visit Diagnosis: Chronic stable angina ([place])  Patient's Home Medications on Admission:  Current Outpatient Medications:    ALPRAZolam (XANAX) 0.5 MG tablet, Take 1-2 tablets (0.5-1 mg total) by mouth at bedtime. (Patient taking differently: Take 1 mg by mouth at bedtime.), Disp: 62 tablet, Rfl: 1   amiodarone (PACERONE) 200 MG tablet, Take 1 tablet (200 mg total) by mouth daily., Disp: 90 tablet, Rfl: 3   apixaban (ELIQUIS) 5 MG TABS tablet, Take 1 tablet (5 mg total) by mouth 2 (two) times daily., Disp: 60 tablet, Rfl: 5   aspirin 81 MG chewable tablet, Chew 1 tablet (81 mg total) by mouth daily., Disp: , Rfl:    atorvastatin (LIPITOR) 80 MG tablet, Take 1 tablet (80 mg total) by mouth daily., Disp: 90 tablet, Rfl: 3   budesonide-formoterol (SYMBICORT) 160-4.5 MCG/ACT inhaler, Inhale 2 puffs into the lungs 2 (two) times daily as needed (shortness of breath)., Disp: , Rfl:    carvedilol (COREG) 6.25 MG tablet, Take 1 tablet (6.25 mg total) by mouth 2 (two) times daily with a meal., Disp: 180 tablet, Rfl: 3   dapagliflozin propanediol (FARXIGA) 10 MG TABS tablet, Take 1 tablet (10 mg total) by mouth daily before breakfast. (Patient taking differently: Take 10 mg by mouth daily in the afternoon.), Disp: 90 tablet, Rfl: 3   gabapentin (NEURONTIN) 300 MG capsule, Take 1 capsule (300 mg total) by mouth 2 (two) times daily., Disp: 60 capsule, Rfl: 5   hydrALAZINE (APRESOLINE) 50 MG tablet, Take 1 tablet (50 mg total) by mouth 3 (three) times daily., Disp:  270 tablet, Rfl: 2   isosorbide dinitrate (ISORDIL) 20 MG tablet, Take 1 tablet (20 mg total) by mouth 3 (three) times daily., Disp: 270 tablet, Rfl: 2   linaclotide (LINZESS) 145 MCG CAPS capsule, Take 145 mcg by mouth daily as needed (constipation)., Disp: , Rfl:    pantoprazole (PROTONIX) 40 MG tablet, Take 1 tablet (40 mg total) by mouth daily., Disp: 30 tablet, Rfl: 0   polyethylene glycol powder (GLYCOLAX/MIRALAX) 17 GM/SCOOP powder, Take 17 g by mouth 2 (two) times daily. (Patient taking differently: Take 17 g by mouth daily as needed (constipation.).), Disp: 510 g, Rfl: 0   senna (SENOKOT) 8.6 MG TABS tablet, Take 2 tablets by mouth at bedtime., Disp: , Rfl:    sodium chloride (OCEAN) 0.65 % SOLN nasal spray, Place 1 spray into both nostrils as needed for congestion. (Patient taking differently: Place 1 spray into both nostrils 2 (two) times daily as needed for congestion.), Disp: , Rfl: 0   Tetrahydrozoline HCl (EYE DROPS REGULAR OP), Place 1 drop into both eyes daily as needed (irritation)., Disp: , Rfl:    vitamin C (ASCORBIC ACID) 500 MG tablet, Take 500 mg by mouth daily., Disp: , Rfl:    doxycycline (VIBRAMYCIN) 100 MG capsule, Take 1 capsule (100 mg total) by mouth every 12 (twelve) hours for 10 days. (Patient not taking: Reported on 06/01/2022), Disp: 20 capsule, Rfl: 0   gabapentin (NEURONTIN) 300 MG capsule, Take 1 capsule (300  mg total) by mouth daily. (Patient not taking: Reported on 06/01/2022), Disp: 30 capsule, Rfl: 3   white petrolatum (VASELINE) GEL, Apply 1 application  topically as needed (wound care). (Patient not taking: Reported on 06/02/2022), Disp: , Rfl:   Past Medical History: Past Medical History:  Diagnosis Date   Anxiety    Arthritis    "all over my body"   Atrial fibrillation ([place])    rate control strategy; LA large   CAD (coronary artery disease) of artery bypass graft    Cardiac arrest ([place]) 05/31/2021   Carpal tunnel syndrome    Cerebral aneurysm dx'd  05/22/931   Complication of anesthesia    "I' was told that I'm a very high risk to be put to sleep; I may not come out of it" (01/16/2016)   COPD (chronic obstructive pulmonary disease) ([place])    Coronary artery disease    a. s/p CABG;  b. Myoview (7/13):  apical anterior ischemia; c. LHC (08/2012): dLM 60%, mid LAD 90%, pCFX 40%, OM1 occluded, RCA 80-90%, distal RCA 60-70%, SVG-Dx patent, SVG-OM1 patent, LIMA-LAD patent, SVG-PDA occluded, EF 55%. => RCA dsz too long and would req multiple stents; pt refused CABG => med Rx.   Depression    DJD (degenerative joint disease)    Dyslipidemia    Dysrhythmia    Hx of echocardiogram    a. Echocardiogram (05/2013): Mild LVH, EF 54.8%, moderate LAE, mild aortic root dilatation, trace pulmonic regurgitation   Hyperlipidemia    Hypertension    Ischemic cardiomyopathy    Obesity    Peripheral neuropathy    Permanent atrial fibrillation ([facility])    Presence of permanent cardiac pacemaker    Type II diabetes mellitus ([place])    "haven't taken RX for 7-8 years" (01/16/2016)   Ventricular fibrillation ([place]) 05/31/2021    Tobacco Use: Social History   Tobacco Use  Smoking Status Former   Packs/day: 1.50   Years: 45.00   Total pack years: 67.50   Types: Cigarettes   Quit date: 10/2020   Years since quitting: 1.6  Smokeless Tobacco Never    Labs: Review Flowsheet       Latest Ref Rng & Units 10/24/2015 05/31/2021 06/01/2021  Labs for ITP Cardiac and Pulmonary Rehab  Cholestrol 125 - 200 mg/dL 96  - -  LDL (calc) <130 mg/dL 33  - -  HDL-C >=40 mg/dL 39  - -  Trlycerides <150 mg/dL 120  - -  Hemoglobin A1c 4.8 - 5.6 % - - 5.6   PH, Arterial 7.350 - 7.450 - 7.275  7.216  7.172  7.088  7.356   PCO2 arterial 32.0 - 48.0 mmHg - 38.2  51.7  48.5  43.5  29.9   Bicarbonate 20.0 - 28.0 mmol/L - 17.6  21.0  17.8  13.5  16.7   TCO2 22 - 32 mmol/L - '19  23  22  19  15  13  18   '$ Acid-base deficit 0.0 - 2.0 mmol/L - 8.0  7.0  11.0  16.0  7.0   O2 Saturation  % - 99.0  100.0  100.0  100.0  97.0     Capillary Blood Glucose: Lab Results  Component Value Date   GLUCAP 106 (H) 06/02/2022   GLUCAP 85 03/03/2022   GLUCAP 84 11/21/2021   GLUCAP 89 06/27/2021   GLUCAP 95 06/26/2021     Exercise Target Goals: Exercise Program Goal: Individual exercise prescription set using results from initial 6 min walk  test and THRR while considering  patient's activity barriers and safety.   Exercise Prescription Goal: Initial exercise prescription builds to 30-45 minutes a day of aerobic activity, 2-3 days per week.  Home exercise guidelines will be given to patient during program as part of exercise prescription that the participant will acknowledge.  Activity Barriers & Risk Stratification:  Activity Barriers & Cardiac Risk Stratification - 06/02/22 1317       Activity Barriers & Cardiac Risk Stratification   Activity Barriers Joint Problems;Arthritis;Other (comment);Balance Concerns    Comments Generalized joint pain, bilateral hip pain, peripheral neuropathy, dizziness.    Cardiac Risk Stratification High             6 Minute Walk:  6 Minute Walk     Row Name 06/02/22 1620         6 Minute Walk   Phase Initial     Distance 0.39 feet  miles     Walk Time 6 minutes     # of Rest Breaks 3     MPH 3.85     METS 3.2     RPE 14     Perceived Dyspnea  2.5     Symptoms Yes (comment)     Comments Bilateral hip pain, 8/10, generalized, overall pain, mild to moderate SOB, RPD=2.5     Resting HR 85 bpm     Resting BP 98/64     Resting Oxygen Saturation  95 %     Exercise Oxygen Saturation  during 6 min walk 99 %     Max Ex. HR 87 bpm     Max Ex. BP 108/50     2 Minute Post BP 118/62              Oxygen Initial Assessment:   Oxygen Re-Evaluation:   Oxygen Discharge (Final Oxygen Re-Evaluation):   Initial Exercise Prescription:  Initial Exercise Prescription - 06/02/22 1600       Date of Initial Exercise RX and Referring  Provider   Date 06/02/22    Referring Provider Belva Crome, MD.    Expected Discharge Date 07/31/22      T5 Nustep   Level 1    SPM 85    Minutes 25    METs 2.2      Prescription Details   Frequency (times per week) 3    Duration Progress to 30 minutes of continuous aerobic without signs/symptoms of physical distress      Intensity   THRR 40-80% of Max Heartrate 61-122    Ratings of Perceived Exertion 11-13    Perceived Dyspnea 0-4      Progression   Progression Continue to progress workloads to maintain intensity without signs/symptoms of physical distress.      Resistance Training   Training Prescription Yes    Weight 2 lbs    Reps 10-15             Perform Capillary Blood Glucose checks as needed.  Exercise Prescription Changes:   Exercise Comments:   Exercise Goals and Review:   Exercise Goals     Row Name 06/02/22 1317             Exercise Goals   Increase Physical Activity Yes       Intervention Provide advice, education, support and counseling about physical activity/exercise needs.;Develop an individualized exercise prescription for aerobic and resistive training based on initial evaluation findings, risk stratification, comorbidities and participant's personal goals.  Expected Outcomes Short Term: Attend rehab on a regular basis to increase amount of physical activity.;Long Term: Exercising regularly at least 3-5 days a week.;Long Term: Add in home exercise to make exercise part of routine and to increase amount of physical activity.       Increase Strength and Stamina Yes       Intervention Provide advice, education, support and counseling about physical activity/exercise needs.;Develop an individualized exercise prescription for aerobic and resistive training based on initial evaluation findings, risk stratification, comorbidities and participant's personal goals.       Expected Outcomes Short Term: Increase workloads from initial exercise  prescription for resistance, speed, and METs.;Short Term: Perform resistance training exercises routinely during rehab and add in resistance training at home;Long Term: Improve cardiorespiratory fitness, muscular endurance and strength as measured by increased METs and functional capacity (6MWT)       Able to understand and use rate of perceived exertion (RPE) scale Yes       Intervention Provide education and explanation on how to use RPE scale       Expected Outcomes Short Term: Able to use RPE daily in rehab to express subjective intensity level;Long Term:  Able to use RPE to guide intensity level when exercising independently       Knowledge and understanding of Target Heart Rate Range (THRR) Yes       Intervention Provide education and explanation of THRR including how the numbers were predicted and where they are located for reference       Expected Outcomes Short Term: Able to state/look up THRR;Long Term: Able to use THRR to govern intensity when exercising independently;Short Term: Able to use daily as guideline for intensity in rehab       Understanding of Exercise Prescription Yes       Intervention Provide education, explanation, and written materials on patient's individual exercise prescription       Expected Outcomes Short Term: Able to explain program exercise prescription;Long Term: Able to explain home exercise prescription to exercise independently                Exercise Goals Re-Evaluation :   Discharge Exercise Prescription (Final Exercise Prescription Changes):   Nutrition:  Target Goals: Understanding of nutrition guidelines, daily intake of sodium '1500mg'$ , cholesterol '200mg'$ , calories 30% from fat and 7% or less from saturated fats, daily to have 5 or more servings of fruits and vegetables.  Biometrics:  Pre Biometrics - 06/02/22 1428       Pre Biometrics   Waist Circumference 49 inches    Hip Circumference 49.75 inches    Waist to Hip Ratio 0.98 %     Triceps Skinfold 26 mm    % Body Fat 36.6 %    Grip Strength 32 kg    Flexibility --   Not performed, c/o bilateral hip pain.             Nutrition Therapy Plan and Nutrition Goals:   Nutrition Assessments:  MEDIFICTS Score Key: ?70 Need to make dietary changes  40-70 Heart Healthy Diet ? 40 Therapeutic Level Cholesterol Diet    Picture Your Plate Scores: <16 Unhealthy dietary pattern with much room for improvement. 41-50 Dietary pattern unlikely to meet recommendations for good health and room for improvement. 51-60 More healthful dietary pattern, with some room for improvement.  >60 Healthy dietary pattern, although there may be some specific behaviors that could be improved.    Nutrition Goals Re-Evaluation:   Nutrition Goals  Re-Evaluation:   Nutrition Goals Discharge (Final Nutrition Goals Re-Evaluation):   Psychosocial: Target Goals: Acknowledge presence or absence of significant depression and/or stress, maximize coping skills, provide positive support system. Participant is able to verbalize types and ability to use techniques and skills needed for reducing stress and depression.  Initial Review & Psychosocial Screening:  Initial Psych Review & Screening - 06/02/22 1435       Initial Review   Current issues with Current Stress Concerns;History of Depression;Current Sleep Concerns   Braven takes Xanax for sleep.   Source of Stress Concerns Unable to perform yard/household activities;Unable to participate in former interests or hobbies    Comments Giovanny is concerned about his health      [place]? Yes   Jesiah has his fiance and his daughter for support     Barriers   Psychosocial barriers to participate in program The patient should benefit from training in stress management and relaxation.      Screening Interventions   Interventions Encouraged to exercise;To provide support and resources with identified psychosocial  needs;Provide feedback about the scores to participant    Expected Outcomes Long Term Goal: Stressors or current issues are controlled or eliminated.;Short Term goal: Identification and review with participant of any Quality of Life or Depression concerns found by scoring the questionnaire.;Long Term goal: The participant improves quality of Life and PHQ9 Scores as seen by post scores and/or verbalization of changes             Quality of Life Scores:  Quality of Life - 06/02/22 1632       Quality of Life   Select Quality of Life      Quality of Life Scores   Health/Function Pre 13.71 %    Socioeconomic Pre 22.5 %    Psych/Spiritual Pre 24 %    Family Pre 30 %    GLOBAL Pre 20.29 %            Scores of 19 and below usually indicate a poorer quality of life in these areas.  A difference of  2-3 points is a clinically meaningful difference.  A difference of 2-3 points in the total score of the Quality of Life Index has been associated with significant improvement in overall quality of life, self-image, physical symptoms, and general health in studies assessing change in quality of life.  PHQ-9: Review Flowsheet       06/02/2022  Depression screen PHQ 2/9  Decreased Interest 0  Down, Depressed, Hopeless 0  PHQ - 2 Score 0   Interpretation of Total Score  Total Score Depression Severity:  1-4 = Minimal depression, 5-9 = Mild depression, 10-14 = Moderate depression, 15-19 = Moderately severe depression, 20-27 = Severe depression   Psychosocial Evaluation and Intervention:   Psychosocial Re-Evaluation:   Psychosocial Discharge (Final Psychosocial Re-Evaluation):   Vocational Rehabilitation: Provide vocational rehab assistance to qualifying candidates.   Vocational Rehab Evaluation & Intervention:  Vocational Rehab - 06/02/22 1436       Initial Vocational Rehab Evaluation & Intervention   Assessment shows need for Vocational Rehabilitation No   Makell is retired and  does not need vocational rehab at this time            Education: Education Goals: Education classes will be provided on a weekly basis, covering required topics. Participant will state understanding/return demonstration of topics presented.     Core Videos: Exercise    Move It!  Clinical staff conducted group or individual video education with verbal and written material and guidebook.  Patient learns the recommended Pritikin exercise program. Exercise with the goal of living a long, healthy life. Some of the health benefits of exercise include controlled diabetes, healthier blood pressure levels, improved cholesterol levels, improved heart and lung capacity, improved sleep, and better body composition. Everyone should speak with their doctor before starting or changing an exercise routine.  Biomechanical Limitations Clinical staff conducted group or individual video education with verbal and written material and guidebook.  Patient learns how biomechanical limitations can impact exercise and how we can mitigate and possibly overcome limitations to have an impactful and balanced exercise routine.  Body Composition Clinical staff conducted group or individual video education with verbal and written material and guidebook.  Patient learns that body composition (ratio of muscle mass to fat mass) is a key component to assessing overall fitness, rather than body weight alone. Increased fat mass, especially visceral belly fat, can put [place] at increased risk for metabolic syndrome, type 2 diabetes, heart disease, and even death. It is recommended to combine diet and exercise (cardiovascular and resistance training) to improve your body composition. Seek guidance from your physician and exercise physiologist before implementing an exercise routine.  Exercise Action Plan Clinical staff conducted group or individual video education with verbal and written material and guidebook.  Patient learns the  recommended strategies to achieve and enjoy long-term exercise adherence, including variety, self-motivation, self-efficacy, and positive decision making. Benefits of exercise include fitness, good health, weight management, more energy, better sleep, less stress, and overall well-being.  Medical   Heart Disease Risk Reduction Clinical staff conducted group or individual video education with verbal and written material and guidebook.  Patient learns our heart is our most vital organ as it circulates oxygen, nutrients, white blood cells, and hormones throughout the entire body, and carries waste away. Data supports a plant-based eating plan like the Pritikin Program for its effectiveness in slowing progression of and reversing heart disease. The video provides a number of recommendations to address heart disease.   Metabolic Syndrome and Belly Fat  Clinical staff conducted group or individual video education with verbal and written material and guidebook.  Patient learns what metabolic syndrome is, how it leads to heart disease, and how one can reverse it and keep it from coming back. You have metabolic syndrome if you have 3 of the following 5 criteria: abdominal obesity, high blood pressure, high triglycerides, low HDL cholesterol, and high blood sugar.  Hypertension and Heart Disease Clinical staff conducted group or individual video education with verbal and written material and guidebook.  Patient learns that high blood pressure, or hypertension, is very common in the [place]. Hypertension is largely due to excessive salt intake, but other important risk factors include being overweight, physical inactivity, drinking too much alcohol, smoking, and not eating enough potassium from fruits and vegetables. High blood pressure is a leading risk factor for heart attack, stroke, congestive heart failure, dementia, kidney failure, and premature death. Long-term effects of excessive salt intake include  stiffening of the arteries and thickening of heart muscle and organ damage. Recommendations include ways to reduce hypertension and the risk of heart disease.  Diseases of Our Time - Focusing on Diabetes Clinical staff conducted group or individual video education with verbal and written material and guidebook.  Patient learns why the best way to stop diseases of our time is prevention, through food and other lifestyle changes. Medicine (  such as prescription pills and surgeries) is often only a Band-Aid on the problem, not a long-term solution. Most common diseases of our time include obesity, type 2 diabetes, hypertension, heart disease, and cancer. The Pritikin Program is recommended and has been proven to help reduce, reverse, and/or prevent the damaging effects of metabolic syndrome.  Nutrition   Overview of the Pritikin Eating Plan  Clinical staff conducted group or individual video education with verbal and written material and guidebook.  Patient learns about the [place] for disease risk reduction. The [place] emphasizes a wide variety of unrefined, minimally-processed carbohydrates, like fruits, vegetables, whole grains, and legumes. Go, Caution, and Stop food choices are explained. Plant-based and lean animal proteins are emphasized. Rationale provided for low sodium intake for blood pressure control, low added sugars for blood sugar stabilization, and low added fats and oils for coronary artery disease risk reduction and weight management.  Calorie Density  Clinical staff conducted group or individual video education with verbal and written material and guidebook.  Patient learns about calorie density and how it impacts the Pritikin Eating Plan. Knowing the characteristics of the food you choose will help you decide whether those foods will lead to weight gain or weight loss, and whether you want to consume more or less of them. Weight loss is usually a side effect of  the Pritikin Eating Plan because of its focus on low calorie-dense foods.  Label Reading  Clinical staff conducted group or individual video education with verbal and written material and guidebook.  Patient learns about the Pritikin recommended label reading guidelines and corresponding recommendations regarding calorie density, added sugars, sodium content, and whole grains.  Dining Out - Part 1  Clinical staff conducted group or individual video education with verbal and written material and guidebook.  Patient learns that restaurant meals can be sabotaging because they can be so high in calories, fat, sodium, and/or sugar. Patient learns recommended strategies on how to positively address this and avoid unhealthy pitfalls.  Facts on Fats  Clinical staff conducted group or individual video education with verbal and written material and guidebook.  Patient learns that lifestyle modifications can be just as effective, if not more so, as many medications for lowering your risk of heart disease. A Pritikin lifestyle can help to reduce your risk of inflammation and atherosclerosis (cholesterol build-up, or plaque, in the artery walls). Lifestyle interventions such as dietary choices and physical activity address the cause of atherosclerosis. A review of the types of fats and their impact on blood cholesterol levels, along with dietary recommendations to reduce fat intake is also included.  Nutrition Action Plan  Clinical staff conducted group or individual video education with verbal and written material and guidebook.  Patient learns how to incorporate Pritikin recommendations into their lifestyle. Recommendations include planning and keeping personal health goals in mind as an important part of their success.  Healthy Mind-Set    Healthy Minds, Bodies, Hearts  Clinical staff conducted group or individual video education with verbal and written material and guidebook.  Patient learns how to  identify when they are stressed. Video will discuss the impact of that stress, as well as the many benefits of stress management. Patient will also be introduced to stress management techniques. The way we think, act, and feel has an impact on our hearts.  How Our Thoughts Can Heal Our Hearts  Clinical staff conducted group or individual video education with verbal and written material and guidebook.  Patient learns that negative thoughts can cause depression and anxiety. This can result in negative lifestyle behavior and serious health problems. Cognitive behavioral therapy is an effective method to help control our thoughts in order to change and improve our emotional outlook.  Additional Videos:  Exercise    Improving Performance  Clinical staff conducted group or individual video education with verbal and written material and guidebook.  Patient learns to use a non-linear approach by alternating intensity levels and lengths of time spent exercising to help burn more calories and lose more body fat. Cardiovascular exercise helps improve heart health, metabolism, hormonal balance, blood sugar control, and recovery from fatigue. Resistance training improves strength, endurance, balance, coordination, reaction time, metabolism, and muscle mass. Flexibility exercise improves circulation, posture, and balance. Seek guidance from your physician and exercise physiologist before implementing an exercise routine and learn your capabilities and proper form for all exercise.  Introduction to Yoga  Clinical staff conducted group or individual video education with verbal and written material and guidebook.  Patient learns about yoga, a discipline of the coming together of mind, breath, and body. The benefits of yoga include improved flexibility, improved range of motion, better posture and core strength, increased lung function, weight loss, and positive self-image. Yoga's heart health benefits include lowered  blood pressure, healthier heart rate, decreased cholesterol and triglyceride levels, improved immune function, and reduced stress. Seek guidance from your physician and exercise physiologist before implementing an exercise routine and learn your capabilities and proper form for all exercise.  Medical   Aging: Enhancing Your Quality of Life  Clinical staff conducted group or individual video education with verbal and written material and guidebook.  Patient learns key strategies and recommendations to stay in good physical health and enhance quality of life, such as prevention strategies, having an advocate, securing a [place], and keeping a list of medications and system for tracking them. It also discusses how to avoid risk for bone loss.  Biology of Weight Control  Clinical staff conducted group or individual video education with verbal and written material and guidebook.  Patient learns that weight gain occurs because we consume more calories than we burn (eating more, moving less). Even if your body weight is normal, you may have higher ratios of fat compared to muscle mass. Too much body fat puts you at increased risk for cardiovascular disease, heart attack, stroke, type 2 diabetes, and obesity-related cancers. In addition to exercise, following the [place] can help reduce your risk.  Decoding Lab Results  Clinical staff conducted group or individual video education with verbal and written material and guidebook.  Patient learns that lab test reflects one measurement whose values change over time and are influenced by many factors, including medication, stress, sleep, exercise, food, hydration, pre-existing medical conditions, and more. It is recommended to use the knowledge from this video to become more involved with your lab results and evaluate your numbers to speak with your doctor.   Diseases of Our Time - Overview  Clinical staff conducted  group or individual video education with verbal and written material and guidebook.  Patient learns that according to the CDC, 50% to 70% of chronic diseases (such as obesity, type 2 diabetes, elevated lipids, hypertension, and heart disease) are avoidable through lifestyle improvements including healthier food choices, listening to satiety cues, and increased physical activity.  Sleep Disorders Clinical staff conducted group or individual video education with verbal and written material and guidebook.  Patient learns how good quality and duration of sleep are important to overall health and well-being. Patient also learns about sleep disorders and how they impact health along with recommendations to address them, including discussing with a physician.  Nutrition  Dining Out - Part 2 Clinical staff conducted group or individual video education with verbal and written material and guidebook.  Patient learns how to plan ahead and communicate in order to maximize their dining experience in a healthy and nutritious manner. Included are recommended food choices based on the type of restaurant the patient is visiting.   Fueling a Best boy conducted group or individual video education with verbal and written material and guidebook.  There is a strong connection between our food choices and our health. Diseases like obesity and type 2 diabetes are very prevalent and are in large-part due to lifestyle choices. The Pritikin Eating Plan provides plenty of food and hunger-curbing satisfaction. It is easy to follow, affordable, and helps reduce health risks.  Menu Workshop  Clinical staff conducted group or individual video education with verbal and written material and guidebook.  Patient learns that restaurant meals can sabotage health goals because they are often packed with calories, fat, sodium, and sugar. Recommendations include strategies to plan ahead and to communicate with the  manager, chef, or server to help order a healthier meal.  Planning Your Eating Strategy  Clinical staff conducted group or individual video education with verbal and written material and guidebook.  Patient learns about the [place] and its benefit of reducing the risk of disease. The [place] does not focus on calories. Instead, it emphasizes high-quality, nutrient-rich foods. By knowing the characteristics of the foods, we choose, we can determine their calorie density and make informed decisions.  Targeting Your Nutrition Priorities  Clinical staff conducted group or individual video education with verbal and written material and guidebook.  Patient learns that lifestyle habits have a tremendous impact on disease risk and progression. This video provides eating and physical activity recommendations based on your personal health goals, such as reducing LDL cholesterol, losing weight, preventing or controlling type 2 diabetes, and reducing high blood pressure.  Vitamins and Minerals  Clinical staff conducted group or individual video education with verbal and written material and guidebook.  Patient learns different ways to obtain key vitamins and minerals, including through a recommended healthy diet. It is important to discuss all supplements you take with your doctor.   Healthy Mind-Set    Smoking Cessation  Clinical staff conducted group or individual video education with verbal and written material and guidebook.  Patient learns that cigarette smoking and tobacco addiction pose a serious health risk which affects millions of people. Stopping smoking will significantly reduce the risk of heart disease, lung disease, and many forms of cancer. Recommended strategies for quitting are covered, including working with your doctor to develop a successful plan.  Culinary   Becoming a Financial trader conducted group or individual video education with verbal and  written material and guidebook.  Patient learns that cooking at home can be healthy, cost-effective, quick, and puts them in control. Keys to cooking healthy recipes will include looking at your recipe, assessing your equipment needs, planning ahead, making it simple, choosing cost-effective seasonal ingredients, and limiting the use of added fats, salts, and sugars.  Cooking - Breakfast and Snacks  Clinical staff conducted group or individual video education with verbal and written material and guidebook.  Patient learns how important breakfast is to satiety and nutrition through the entire day. Recommendations include key foods to eat during breakfast to help stabilize blood sugar levels and to prevent overeating at meals later in the day. Planning ahead is also a key component.  Cooking - Human resources officer conducted group or individual video education with verbal and written material and guidebook.  Patient learns eating strategies to improve overall health, including an approach to cook more at home. Recommendations include thinking of animal protein as a side on your plate rather than center stage and focusing instead on lower calorie dense options like vegetables, fruits, whole grains, and plant-based proteins, such as beans. Making sauces in large quantities to freeze for later and leaving the skin on your vegetables are also recommended to maximize your experience.  Cooking - Healthy Salads and Dressing Clinical staff conducted group or individual video education with verbal and written material and guidebook.  Patient learns that vegetables, fruits, whole grains, and legumes are the foundations of the [place]. Recommendations include how to incorporate each of these in flavorful and healthy salads, and how to create homemade salad dressings. Proper handling of ingredients is also covered. Cooking - Soups and [employer] - Soups and Desserts Clinical staff  conducted group or individual video education with verbal and written material and guidebook.  Patient learns that Pritikin soups and desserts make for easy, nutritious, and delicious snacks and meal components that are low in sodium, fat, sugar, and calorie density, while high in vitamins, minerals, and filling fiber. Recommendations include simple and healthy ideas for soups and desserts.   Overview     The Pritikin Solution Program Overview Clinical staff conducted group or individual video education with verbal and written material and guidebook.  Patient learns that the results of the [place] Program have been documented in more than 100 articles published in peer-reviewed journals, and the benefits include reducing risk factors for (and, in some cases, even reversing) high cholesterol, high blood pressure, type 2 diabetes, obesity, and more! An overview of the three key pillars of the Pritikin Program will be covered: eating well, doing regular exercise, and having a healthy mind-set.  WORKSHOPS  Exercise: Exercise Basics: Building Your Action Plan Clinical staff led group instruction and group discussion with PowerPoint presentation and patient guidebook. To enhance the learning environment the use of posters, models and videos may be added. At the conclusion of this workshop, patients will comprehend the difference between physical activity and exercise, as well as the benefits of incorporating both, into their routine. Patients will understand the FITT (Frequency, Intensity, Time, and Type) principle and how to use it to build an exercise action plan. In addition, safety concerns and other considerations for exercise and cardiac rehab will be addressed by the presenter. The purpose of this lesson is to promote a comprehensive and effective weekly exercise routine in order to improve patients' overall level of fitness.   Managing Heart Disease: Your Path to a Healthier Heart Clinical  staff led group instruction and group discussion with PowerPoint presentation and patient guidebook. To enhance the learning environment the use of posters, models and videos may be added.At the conclusion of this workshop, patients will understand the anatomy and physiology of the heart. Additionally, they will understand how Pritikin's three pillars impact the risk factors, the progression, and the management of heart disease.  The purpose of this lesson is to provide a high-level overview of the  heart, heart disease, and how the Pritikin lifestyle positively impacts risk factors.  Exercise Biomechanics Clinical staff led group instruction and group discussion with PowerPoint presentation and patient guidebook. To enhance the learning environment the use of posters, models and videos may be added. Patients will learn how the structural parts of their bodies function and how these functions impact their daily activities, movement, and exercise. Patients will learn how to promote a neutral spine, learn how to manage pain, and identify ways to improve their physical movement in order to promote healthy living. The purpose of this lesson is to expose patients to common physical limitations that impact physical activity. Participants will learn practical ways to adapt and manage aches and pains, and to minimize their effect on regular exercise. Patients will learn how to maintain good posture while sitting, walking, and lifting.  Balance Training and Fall Prevention  Clinical staff led group instruction and group discussion with PowerPoint presentation and patient guidebook. To enhance the learning environment the use of posters, models and videos may be added. At the conclusion of this workshop, patients will understand the importance of their sensorimotor skills (vision, proprioception, and the vestibular system) in maintaining their ability to balance as they age. Patients will apply a variety of  balancing exercises that are appropriate for their current level of function. Patients will understand the common causes for poor balance, possible solutions to these problems, and ways to modify their physical environment in order to minimize their fall risk. The purpose of this lesson is to teach patients about the importance of maintaining balance as they age and ways to minimize their risk of falling.  WORKSHOPS   Nutrition:  Fueling a Scientist, research (physical sciences) led group instruction and group discussion with PowerPoint presentation and patient guidebook. To enhance the learning environment the use of posters, models and videos may be added. Patients will review the foundational principles of the [place] and understand what constitutes a serving size in each of the food groups. Patients will also learn Pritikin-friendly foods that are better choices when away from home and review make-ahead meal and snack options. Calorie density will be reviewed and applied to three nutrition priorities: weight maintenance, weight loss, and weight gain. The purpose of this lesson is to reinforce (in a group setting) the key concepts around what patients are recommended to eat and how to apply these guidelines when away from home by planning and selecting Pritikin-friendly options. Patients will understand how calorie density may be adjusted for different weight management goals.  Mindful Eating  Clinical staff led group instruction and group discussion with PowerPoint presentation and patient guidebook. To enhance the learning environment the use of posters, models and videos may be added. Patients will briefly review the concepts of the [place] and the importance of low-calorie dense foods. The concept of mindful eating will be introduced as well as the importance of paying attention to internal hunger signals. Triggers for non-hunger eating and techniques for dealing with triggers will be  explored. The purpose of this lesson is to provide patients with the opportunity to review the basic principles of the [place], discuss the value of eating mindfully and how to measure internal cues of hunger and fullness using the Hunger Scale. Patients will also discuss reasons for non-hunger eating and learn strategies to use for controlling emotional eating.  Targeting Your Nutrition Priorities Clinical staff led group instruction and group discussion with PowerPoint presentation and patient guidebook. To  enhance the learning environment the use of posters, models and videos may be added. Patients will learn how to determine their genetic susceptibility to disease by reviewing their family history. Patients will gain insight into the importance of diet as part of an overall healthy lifestyle in mitigating the impact of genetics and other environmental insults. The purpose of this lesson is to provide patients with the opportunity to assess their personal nutrition priorities by looking at their family history, their own health history and current risk factors. Patients will also be able to discuss ways of prioritizing and modifying the [place] for their highest risk areas  Menu  Clinical staff led group instruction and group discussion with PowerPoint presentation and patient guidebook. To enhance the learning environment the use of posters, models and videos may be added. Using menus brought in from [employer], or printed from [employer], patients will apply the [place] dining out guidelines that were presented in the [employer] video. Patients will also be able to practice these guidelines in a variety of provided scenarios. The purpose of this lesson is to provide patients with the opportunity to practice hands-on learning of the [place] with actual menus and practice scenarios.  Label Reading Clinical staff led group  instruction and group discussion with PowerPoint presentation and patient guidebook. To enhance the learning environment the use of posters, models and videos may be added. Patients will review and discuss the Pritikin label reading guidelines presented in Pritikin's Label Reading Educational series video. Using fool labels brought in from local grocery stores and markets, patients will apply the label reading guidelines and determine if the packaged food meet the Pritikin guidelines. The purpose of this lesson is to provide patients with the opportunity to review, discuss, and practice hands-on learning of the Pritikin Label Reading guidelines with actual packaged food labels. [place] Workshops are designed to teach patients ways to prepare quick, simple, and affordable recipes at home. The importance of nutrition's role in chronic disease risk reduction is reflected in its emphasis in the overall Pritikin program. By learning how to prepare essential core Pritikin Eating Plan recipes, patients will increase control over what they eat; be able to customize the flavor of foods without the use of added salt, sugar, or fat; and improve the quality of the food they consume. By learning a set of core recipes which are easily assembled, quickly prepared, and affordable, patients are more likely to prepare more healthy foods at home. These workshops focus on convenient breakfasts, simple entres, side dishes, and desserts which can be prepared with minimal effort and are consistent with nutrition recommendations for cardiovascular risk reduction. Cooking [employer] are taught by a Engineer, materials (RD) who has been trained by the [employer]. The chef or RD has a clear understanding of the importance of minimizing - if not completely eliminating - added fat, sugar, and sodium in recipes. Throughout the series of [place] Workshop sessions, patients  will learn about healthy ingredients and efficient methods of cooking to build confidence in their capability to prepare    Cooking School weekly topics:  Adding Flavor- Sodium-Free  Fast and Healthy Breakfasts  Powerhouse Plant-Based Proteins  Satisfying Salads and Dressings  Simple Sides and Sauces  International Cuisine-Spotlight on the [place] Zones  Delicious Desserts  Savory Soups  [employer] - Meals in a Snap  Tasty Appetizers and Snacks  Comforting Weekend  Breakfasts  One-Pot Wonders   Fast Evening Meals  Contractor Your Pritikin Plate  WORKSHOPS   Healthy Mindset (Psychosocial): New Thoughts, New Behaviors Clinical staff led group instruction and group discussion with PowerPoint presentation and patient guidebook. To enhance the learning environment the use of posters, models and videos may be added. Patients will learn and practice techniques for developing effective health and lifestyle goals. Patients will be able to effectively apply the goal setting process learned to develop at least one new personal goal.  The purpose of this lesson is to expose patients to a new skill set of behavior modification techniques such as techniques setting SMART goals, overcoming barriers, and achieving new thoughts and new behaviors.  Managing Moods and Relationships Clinical staff led group instruction and group discussion with PowerPoint presentation and patient guidebook. To enhance the learning environment the use of posters, models and videos may be added. Patients will learn how emotional and chronic stress factors can impact their health and relationships. They will learn healthy ways to manage their moods and utilize positive coping mechanisms. In addition, ICR patients will learn ways to improve communication skills. The purpose of this lesson is to expose patients to ways of understanding how one's mood and health are intimately connected. Developing a  healthy outlook can help build positive relationships and connections with others. Patients will understand the importance of utilizing effective communication skills that include actively listening and being heard. They will learn and understand the importance of the "4 Cs" and especially Connections in fostering of a Healthy Mind-Set.  Healthy Sleep for a Healthy Heart Clinical staff led group instruction and group discussion with PowerPoint presentation and patient guidebook. To enhance the learning environment the use of posters, models and videos may be added. At the conclusion of this workshop, patients will be able to demonstrate knowledge of the importance of sleep to overall health, well-being, and quality of life. They will understand the symptoms of, and treatments for, common sleep disorders. Patients will also be able to identify daytime and nighttime behaviors which impact sleep, and they will be able to apply these tools to help manage sleep-related challenges. The purpose of this lesson is to provide patients with a general overview of sleep and outline the importance of quality sleep. Patients will learn about a few of the most common sleep disorders. Patients will also be introduced to the concept of "sleep hygiene," and discover ways to self-manage certain sleeping problems through simple daily behavior changes. Finally, the workshop will motivate patients by clarifying the links between quality sleep and their goals of heart-healthy living.   Recognizing and Reducing Stress Clinical staff led group instruction and group discussion with PowerPoint presentation and patient guidebook. To enhance the learning environment the use of posters, models and videos may be added. At the conclusion of this workshop, patients will be able to understand the types of stress reactions, differentiate between acute and chronic stress, and recognize the impact that chronic stress has on their health. They will  also be able to apply different coping mechanisms, such as reframing negative self-talk. Patients will have the opportunity to practice a variety of stress management techniques, such as deep abdominal breathing, progressive muscle relaxation, and/or guided imagery.  The purpose of this lesson is to educate patients on the role of stress in their lives and to provide healthy techniques for coping with it.  Learning Barriers/Preferences:  Learning Barriers/Preferences - 06/02/22 1547  Learning Barriers/Preferences   Learning Barriers Inability to learn new things;Exercise Concerns;Hearing   Danny reports feeling lightheaded and cant remeber as he used to   [employer] Resources;Individual Instruction             Education Topics:  Knowledge Questionnaire Score:   Core Components/Risk Factors/Patient Goals at Admission:  Personal Goals and Risk Factors at Admission - 06/02/22 1629       Core Components/Risk Factors/Patient Goals on Admission    Weight Management Yes;Obesity;Weight Loss    Intervention Weight Management/Obesity: Establish reasonable short term and long term weight goals.;Obesity: Provide education and appropriate resources to help participant work on and attain dietary goals.    Admit Weight 265 lb 10.5 oz (120.5 kg)    Goal Weight: Short Term 260 lb (117.9 kg)    Goal Weight: Long Term 245 lb (111.1 kg)    Expected Outcomes Short Term: Continue to assess and modify interventions until short term weight is achieved;Long Term: Adherence to nutrition and physical activity/exercise program aimed toward attainment of established weight goal;Weight Loss: Understanding of general recommendations for a balanced deficit meal plan, which promotes 1-2 lb weight loss per week and includes a negative energy balance of [phone] kcal/d    Diabetes Yes    Intervention Provide education about signs/symptoms and action to take for  hypo/hyperglycemia.;Provide education about proper nutrition, including hydration, and aerobic/resistive exercise prescription along with prescribed medications to achieve blood glucose in normal ranges: Fasting glucose 65-99 mg/dL    Expected Outcomes Short Term: Participant verbalizes understanding of the signs/symptoms and immediate care of hyper/hypoglycemia, proper foot care and importance of medication, aerobic/resistive exercise and nutrition plan for blood glucose control.;Long Term: Attainment of HbA1C < 7%.    Heart Failure Yes    Intervention Provide a combined exercise and nutrition program that is supplemented with education, support and counseling about heart failure. Directed toward relieving symptoms such as shortness of breath, decreased exercise tolerance, and extremity edema.    Expected Outcomes Improve functional capacity of life;Short term: Attendance in program 2-3 days a week with increased exercise capacity. Reported lower sodium intake. Reported increased fruit and vegetable intake. Reports medication compliance.;Short term: Daily weights obtained and reported for increase. Utilizing diuretic protocols set by physician.;Long term: Adoption of self-care skills and reduction of barriers for early signs and symptoms recognition and intervention leading to self-care maintenance.    Hypertension Yes    Intervention Provide education on lifestyle modifcations including regular physical activity/exercise, weight management, moderate sodium restriction and increased consumption of fresh fruit, vegetables, and low fat dairy, alcohol moderation, and smoking cessation.;Monitor prescription use compliance.    Expected Outcomes Short Term: Continued assessment and intervention until BP is < 140/62m HG in hypertensive participants. < 130/846mHG in hypertensive participants with diabetes, heart failure or chronic kidney disease.;Long Term: Maintenance of blood pressure at goal levels.    Lipids  Yes    Intervention Provide education and support for participant on nutrition & aerobic/resistive exercise along with prescribed medications to achieve LDL '70mg'$ , HDL >'40mg'$ .    Expected Outcomes Short Term: Participant states understanding of desired cholesterol values and is compliant with medications prescribed. Participant is following exercise prescription and nutrition guidelines.;Long Term: Cholesterol controlled with medications as prescribed, with individualized exercise RX and with personalized nutrition plan. Value goals: LDL < '70mg'$ , HDL > 40 mg.    Stress Yes    Intervention Offer individual and/or small group education and counseling on adjustment to heart disease,  stress management and health-related lifestyle change. Teach and support self-help strategies.;Refer participants experiencing significant psychosocial distress to appropriate mental health specialists for further evaluation and treatment. When possible, include family members and significant others in education/counseling sessions.    Expected Outcomes Short Term: Participant demonstrates changes in health-related behavior, relaxation and other stress management skills, ability to obtain effective social support, and compliance with psychotropic medications if prescribed.;Long Term: Emotional wellbeing is indicated by absence of clinically significant psychosocial distress or social isolation.             Core Components/Risk Factors/Patient Goals Review:    Core Components/Risk Factors/Patient Goals at Discharge (Final Review):    ITP Comments:  ITP Comments     Row Name 06/02/22 1304 06/02/22 1434         ITP Comments Medical Director- Dr. Fransico Him, MD Dr Fransico Him MD, Medical Director. Introduction to [employer] program provided at [employer]. Initial resouce packet reviewed with the patient and his fiance. Patient took  [facility] packet home to review.               Comments:  MORGON PAMER attended orientation for the cardiac rehabilitation program on 06/02/22 to perform intake and exercise walk test. Patient introduced to the [facility] and orientation packet was reviewed. Completed 6-minute walk test, measurements, initial ITP, and exercise prescription. Vital signs stable. Telemetry-V-paced, asymptomatic.  Service time was from 1304 to 1530.

## 2022-06-02 NOTE — Progress Notes (Signed)
Cardiac Rehab Medication Review by a Nurse  Does the patient  feel that his/her medications are working for him/her?  yes  Has the patient been experiencing any side effects to the medications prescribed?  yes  Does the patient measure his/her own blood pressure or blood glucose at home?  yes   Does the patient have any problems obtaining medications due to transportation or finances?   yes  Understanding of regimen: fair Understanding of indications: fair Potential of compliance: fair    Nurse comments: Ross [name] reports that he continues to feel lightheaded. Sitting and standing blood pressures obtained. Will forward today's BP readings to Dr Tamala Julian for review. Ross [name] says that he checks his blood pressures daily. Ross [name] does not check his CBG's at all    [place] 06/02/2022 2:07 PM

## 2022-06-02 NOTE — Progress Notes (Signed)
NuStep Test Level: 1.0 Distance: 0.62 km Total Steps: 708 Average METs: 3.2 Average Steps/min::118 Watts: 67

## 2022-06-03 ENCOUNTER — Ambulatory Visit (INDEPENDENT_AMBULATORY_CARE_PROVIDER_SITE_OTHER): Payer: Medicare Other

## 2022-06-03 ENCOUNTER — Telehealth (HOSPITAL_COMMUNITY): Payer: Self-pay | Admitting: *Deleted

## 2022-06-03 ENCOUNTER — Telehealth: Payer: Self-pay

## 2022-06-03 ENCOUNTER — Other Ambulatory Visit (HOSPITAL_COMMUNITY): Payer: Self-pay

## 2022-06-03 DIAGNOSIS — I255 Ischemic cardiomyopathy: Secondary | ICD-10-CM

## 2022-06-03 MED ORDER — HYDRALAZINE HCL 25 MG PO TABS
25.0000 mg | ORAL_TABLET | Freq: Three times a day (TID) | ORAL | 3 refills | Status: DC
  Filled 2022-06-03: qty 270, 90d supply, fill #0

## 2022-06-03 NOTE — Telephone Encounter (Signed)
-----   Message from Belva Crome, MD sent at 06/03/2022  8:22 AM EDT ----- Regarding: RE: complaints of feeling lightheaded at cardiac rehab orientation To have a little higher blood pressure I recommend the following: Decrease hydralazine to 25 mg 3 times per day instead of 50 mg 3 times per day.  Drue Dun, please instruct him on the medication change. ----- Message ----- From: Magda Kiel, RN Sent: 06/02/2022   5:19 PM EDT To: Belva Crome, MD; Shawna Orleans L Subject: complaints of feeling lightheaded at cardiac#  Good afternoon Dr Tamala Julian,  Mr Plantz attended cardiac rehab orientation this afternoon. He continues to complains of feeling lightheaded as he reported at his office visit with you in April. Mr Steinborn was noted to be unsteady on his feet he refused to use a rollator or walker for the 6 minute walk test. Mr Aubuchon did a 6 minute nustep test instead which he completed without difficulty.  Vital signs are as follows. 85 bpm      Resting BP 98/64 sitting. Standing BP 104/68   Resting Oxygen Saturation  95 %    Exercise Oxygen Saturation  during 6 min walk 99 %    Max Ex. HR 87 bpm    Max Ex. BP 108/50    2 Minute Post BP 118/62   Would you consider adjusting his medications? I appreciate your input! Thanks for your assistance.  Sincerely, Barnet Pall RN Cardiac Rehab

## 2022-06-03 NOTE — Telephone Encounter (Signed)
-----   Message from Belva Crome, MD sent at 06/03/2022  8:22 AM EDT ----- Regarding: RE: complaints of feeling lightheaded at cardiac rehab orientation To have a little higher blood pressure I recommend the following: Decrease hydralazine to 25 mg 3 times per day instead of 50 mg 3 times per day.  Drue Dun, please instruct him on the medication change. ----- Message ----- From: Magda Kiel, RN Sent: 06/02/2022   5:19 PM EDT To: Belva Crome, MD; Shawna Orleans L Subject: complaints of feeling lightheaded at cardiac#  Good afternoon Dr Tamala Julian,  Mr Petillo attended cardiac rehab orientation this afternoon. He continues to complains of feeling lightheaded as he reported at his office visit with you in April. Mr Niven was noted to be unsteady on his feet he refused to use a rollator or walker for the 6 minute walk test. Mr Rask did a 6 minute nustep test instead which he completed without difficulty.  Vital signs are as follows. 85 bpm      Resting BP 98/64 sitting. Standing BP 104/68   Resting Oxygen Saturation  95 %    Exercise Oxygen Saturation  during 6 min walk 99 %    Max Ex. HR 87 bpm    Max Ex. BP 108/50    2 Minute Post BP 118/62   Would you consider adjusting his medications? I appreciate your input! Thanks for your assistance.  Sincerely, Barnet Pall RN Cardiac Rehab

## 2022-06-03 NOTE — Telephone Encounter (Signed)
Left message for patient with Dr. Thompson Caul recommendation: To have a little higher blood pressure I recommend the following: Decrease hydralazine to 25 mg 3 times per day instead of 50 mg 3 times per day.  Provided office number for callback for patient to call and discuss medication change. Medication list updated with Hydralazine '25mg'$  TID.

## 2022-06-04 ENCOUNTER — Other Ambulatory Visit (HOSPITAL_COMMUNITY): Payer: Self-pay

## 2022-06-05 LAB — CUP PACEART REMOTE DEVICE CHECK
Battery Remaining Longevity: 109 mo
Battery Voltage: 3.08 V
Brady Statistic AP VP Percent: 0 %
Brady Statistic AP VS Percent: 0 %
Brady Statistic AS VP Percent: 0 %
Brady Statistic AS VS Percent: 0 %
Brady Statistic RA Percent Paced: 0 %
Brady Statistic RV Percent Paced: 99.65 %
Date Time Interrogation Session: 20230615153127
HighPow Impedance: 66 Ohm
Implantable Lead Implant Date: 20230314
Implantable Lead Implant Date: 20230314
Implantable Lead Location: 753860
Implantable Lead Location: 753860
Implantable Lead Model: 3830
Implantable Pulse Generator Implant Date: 20230314
Lead Channel Impedance Value: 285 Ohm
Lead Channel Impedance Value: 304 Ohm
Lead Channel Impedance Value: 361 Ohm
Lead Channel Impedance Value: 399 Ohm
Lead Channel Impedance Value: 4047 Ohm
Lead Channel Impedance Value: 532 Ohm
Lead Channel Pacing Threshold Amplitude: 0.375 V
Lead Channel Pacing Threshold Amplitude: 0.625 V
Lead Channel Pacing Threshold Pulse Width: 0.4 ms
Lead Channel Pacing Threshold Pulse Width: 0.4 ms
Lead Channel Sensing Intrinsic Amplitude: 6.875 mV
Lead Channel Sensing Intrinsic Amplitude: 6.875 mV
Lead Channel Setting Pacing Amplitude: 0.5 V
Lead Channel Setting Pacing Amplitude: 2 V
Lead Channel Setting Pacing Pulse Width: 0.03 ms
Lead Channel Setting Pacing Pulse Width: 0.4 ms
Lead Channel Setting Sensing Sensitivity: 0.3 mV

## 2022-06-08 ENCOUNTER — Encounter (HOSPITAL_COMMUNITY)
Admission: RE | Admit: 2022-06-08 | Discharge: 2022-06-08 | Disposition: A | Discharge status: Home / Self Care | Payer: Medicare Other | Source: Ambulatory Visit | Attending: Interventional Cardiology | Admitting: Interventional Cardiology

## 2022-06-08 DIAGNOSIS — I5022 Chronic systolic (congestive) heart failure: Secondary | ICD-10-CM | POA: Diagnosis not present

## 2022-06-08 DIAGNOSIS — Z955 Presence of coronary angioplasty implant and graft: Secondary | ICD-10-CM | POA: Diagnosis not present

## 2022-06-08 DIAGNOSIS — R7303 Prediabetes: Secondary | ICD-10-CM | POA: Diagnosis not present

## 2022-06-08 DIAGNOSIS — I208 Other forms of angina pectoris: Secondary | ICD-10-CM | POA: Diagnosis not present

## 2022-06-08 LAB — GLUCOSE, CAPILLARY: Glucose-Capillary: 91 mg/dL (ref 70–99)

## 2022-06-08 NOTE — Progress Notes (Signed)
Daily Session Note  Patient Details  Name: Ross [name] MRN: [id number] Date of Birth: [birth year] Referring Provider:   Flowsheet Row INTENSIVE CARDIAC REHAB ORIENT from 06/02/2022 in [facility] CARDIAC [facility]  Referring Provider Lyn Records, MD.       Encounter Date: 06/08/2022  Check In:  Session Check In - 06/08/22 1454       Check-In   Supervising physician immediately available to respond to emergencies [facility] immediately available    Physician(s) Dr. Blake Divine    Location [facility]    Staff Present Gladstone Lighter, RN, Zachery Conch, MS, ACSM CEP, Exercise Physiologist;Jetta Dan Humphreys BS, ACSM EP-C, Exercise Physiologist;David [place], MS, ACSM-CEP, CCRP, Exercise Physiologist    Virtual Visit No    Medication changes reported     No    Fall or balance concerns reported    No    Tobacco Cessation No Change    Current number of cigarettes/nicotine per day     0    Warm-up and Cool-down Performed as group-led instruction    Resistance Training Performed Yes    VAD Patient? No    PAD/SET Patient? No      Pain Assessment   Currently in Pain? No/denies    Pain Score 0-No pain    Multiple Pain Sites No             Capillary Blood Glucose: No results found for this or any previous visit (from the past 24 hour(s)).    Social History   Tobacco Use  Smoking Status Former   Packs/day: 1.50   Years: 45.00   Total pack years: 67.50   Types: Cigarettes   Quit date: 10/2020   Years since quitting: 1.6  Smokeless Tobacco Never    Goals Met:  Exercise tolerated well No report of concerns or symptoms today Strength training completed today  Goals Unmet:  Not Applicable  Comments: Ross [name] started cardiac rehab today.  Pt tolerated light exercise without difficulty. Ross [name] is deconditioned. VSS, telemetry-V paced, asymptomatic.  Medication list reconciled. Pt denies barriers to medicaiton compliance.  PSYCHOSOCIAL  ASSESSMENT:  PHQ-0. Pt exhibits positive coping skills, hopeful outlook with supportive family.QUALITY OF LIFE SCORE REVIEW  Pt completed Quality of Life survey as a participant in Cardiac Rehab.  Scores 21.0 or below are considered low.  Pt score very low in several areas Overall 20.29, Health and Function 13.71, socioeconomic 22.5, physiological and spiritual 24, family 30. Patient quality of life slightly altered by physical constraints which limits ability to perform as prior to recent cardiac illness. Ross [name] says he has felt he has been ina fog since his cardiac arrest in 2022.Marland Kitchen  Offered emotional support and reassurance. Patient is deconditioned and is dissatisfied with his health due to his various health issues.  Will continue to monitor and intervene as necessary.      Pt enjoys going to the beach and the mountains,and shooting pool.   Pt oriented to exercise equipment and routine.    Understanding verbalized. Ross Headings RN BSN    Dr. Armanda Magic is Medical Director for Cardiac Rehab at [facility].

## 2022-06-09 ENCOUNTER — Other Ambulatory Visit: Payer: Self-pay | Admitting: Student

## 2022-06-09 ENCOUNTER — Other Ambulatory Visit (HOSPITAL_COMMUNITY): Payer: Self-pay

## 2022-06-09 DIAGNOSIS — I482 Chronic atrial fibrillation, unspecified: Secondary | ICD-10-CM

## 2022-06-09 LAB — GLUCOSE, CAPILLARY: Glucose-Capillary: 109 mg/dL — ABNORMAL HIGH (ref 70–99)

## 2022-06-09 MED ORDER — APIXABAN 5 MG PO TABS
5.0000 mg | ORAL_TABLET | Freq: Two times a day (BID) | ORAL | 5 refills | Status: DC
  Filled 2022-06-09: qty 60, 30d supply, fill #0
  Filled 2022-07-08: qty 60, 30d supply, fill #1
  Filled 2022-08-12: qty 60, 30d supply, fill #2
  Filled 2022-09-09: qty 60, 30d supply, fill #3
  Filled 2023-01-11: qty 60, 30d supply, fill #4
  Filled 2023-02-07: qty 60, 30d supply, fill #5

## 2022-06-09 MED ORDER — PANTOPRAZOLE SODIUM 40 MG PO TBEC
40.0000 mg | DELAYED_RELEASE_TABLET | Freq: Every day | ORAL | 3 refills | Status: DC
  Filled 2022-06-09: qty 30, 30d supply, fill #0
  Filled 2022-07-08: qty 30, 30d supply, fill #1
  Filled 2022-08-10: qty 30, 30d supply, fill #2
  Filled 2022-09-09: qty 30, 30d supply, fill #3

## 2022-06-09 NOTE — Telephone Encounter (Signed)
Eliquis '5mg'$  refill request received. Patient is 66 years old, weight-120.5kg, Crea-1.61 on 02/27/22, Diagnosis-Afib, and last seen by Dr. Tamala Julian on 03/23/2022. Dose is appropriate based on dosing criteria. Will send in refill to requested pharmacy.

## 2022-06-10 ENCOUNTER — Encounter (HOSPITAL_COMMUNITY)
Admission: RE | Admit: 2022-06-10 | Discharge: 2022-06-10 | Disposition: A | Discharge status: Home / Self Care | Payer: Medicare Other | Source: Ambulatory Visit | Attending: Interventional Cardiology | Admitting: Interventional Cardiology

## 2022-06-10 ENCOUNTER — Other Ambulatory Visit (HOSPITAL_COMMUNITY): Payer: Self-pay

## 2022-06-10 DIAGNOSIS — I208 Other forms of angina pectoris: Secondary | ICD-10-CM | POA: Diagnosis not present

## 2022-06-10 DIAGNOSIS — Z955 Presence of coronary angioplasty implant and graft: Secondary | ICD-10-CM | POA: Diagnosis not present

## 2022-06-10 DIAGNOSIS — I5022 Chronic systolic (congestive) heart failure: Secondary | ICD-10-CM | POA: Diagnosis not present

## 2022-06-10 DIAGNOSIS — R7303 Prediabetes: Secondary | ICD-10-CM | POA: Diagnosis not present

## 2022-06-10 LAB — GLUCOSE, CAPILLARY
Glucose-Capillary: 82 mg/dL (ref 70–99)
Glucose-Capillary: 95 mg/dL (ref 70–99)

## 2022-06-12 ENCOUNTER — Encounter (HOSPITAL_COMMUNITY)
Admission: RE | Admit: 2022-06-12 | Discharge: 2022-06-12 | Disposition: A | Discharge status: Home / Self Care | Payer: Medicare Other | Source: Ambulatory Visit | Attending: Interventional Cardiology | Admitting: Interventional Cardiology

## 2022-06-12 DIAGNOSIS — R7303 Prediabetes: Secondary | ICD-10-CM | POA: Diagnosis not present

## 2022-06-12 DIAGNOSIS — I208 Other forms of angina pectoris: Secondary | ICD-10-CM

## 2022-06-12 DIAGNOSIS — Z955 Presence of coronary angioplasty implant and graft: Secondary | ICD-10-CM | POA: Diagnosis not present

## 2022-06-12 DIAGNOSIS — I5022 Chronic systolic (congestive) heart failure: Secondary | ICD-10-CM | POA: Diagnosis not present

## 2022-06-15 ENCOUNTER — Encounter (HOSPITAL_COMMUNITY)
Admission: RE | Admit: 2022-06-15 | Discharge: 2022-06-15 | Disposition: A | Discharge status: Home / Self Care | Payer: Medicare Other | Source: Ambulatory Visit | Attending: Interventional Cardiology | Admitting: Interventional Cardiology

## 2022-06-15 DIAGNOSIS — I208 Other forms of angina pectoris: Secondary | ICD-10-CM | POA: Diagnosis not present

## 2022-06-15 DIAGNOSIS — R7303 Prediabetes: Secondary | ICD-10-CM | POA: Diagnosis not present

## 2022-06-15 DIAGNOSIS — Z955 Presence of coronary angioplasty implant and graft: Secondary | ICD-10-CM | POA: Diagnosis not present

## 2022-06-15 DIAGNOSIS — I5022 Chronic systolic (congestive) heart failure: Secondary | ICD-10-CM | POA: Diagnosis not present

## 2022-06-15 LAB — GLUCOSE, CAPILLARY: Glucose-Capillary: 96 mg/dL (ref 70–99)

## 2022-06-16 ENCOUNTER — Encounter: Payer: Self-pay | Admitting: Cardiology

## 2022-06-16 ENCOUNTER — Ambulatory Visit: Payer: Medicare Other | Admitting: Cardiology

## 2022-06-16 ENCOUNTER — Other Ambulatory Visit (HOSPITAL_COMMUNITY): Payer: Self-pay

## 2022-06-16 VITALS — BP 112/72 | HR 71 | Ht 72.0 in | Wt 264.8 lb

## 2022-06-16 DIAGNOSIS — I4821 Permanent atrial fibrillation: Secondary | ICD-10-CM | POA: Diagnosis not present

## 2022-06-16 DIAGNOSIS — Z79899 Other long term (current) drug therapy: Secondary | ICD-10-CM

## 2022-06-16 MED ORDER — HYDRALAZINE HCL 25 MG PO TABS
25.0000 mg | ORAL_TABLET | Freq: Three times a day (TID) | ORAL | 1 refills | Status: DC
  Filled 2022-06-16: qty 180, 60d supply, fill #0
  Filled 2022-08-20: qty 90, 30d supply, fill #0
  Filled 2022-09-02 – 2022-09-14 (×4): qty 90, 30d supply, fill #1
  Filled 2022-10-08: qty 90, 30d supply, fill #2
  Filled 2022-11-09: qty 90, 30d supply, fill #3
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
## 2022-06-17 ENCOUNTER — Encounter (HOSPITAL_COMMUNITY)
Admission: RE | Admit: 2022-06-17 | Discharge: 2022-06-17 | Disposition: A | Discharge status: Home / Self Care | Payer: Medicare Other | Source: Ambulatory Visit | Attending: Interventional Cardiology | Admitting: Interventional Cardiology

## 2022-06-17 DIAGNOSIS — I208 Other forms of angina pectoris: Secondary | ICD-10-CM

## 2022-06-17 DIAGNOSIS — R7303 Prediabetes: Secondary | ICD-10-CM | POA: Diagnosis not present

## 2022-06-17 DIAGNOSIS — I5022 Chronic systolic (congestive) heart failure: Secondary | ICD-10-CM | POA: Diagnosis not present

## 2022-06-17 DIAGNOSIS — Z955 Presence of coronary angioplasty implant and graft: Secondary | ICD-10-CM | POA: Diagnosis not present

## 2022-06-17 LAB — TSH: TSH: 4.88 u[IU]/mL — ABNORMAL HIGH (ref 0.450–4.500)

## 2022-06-17 LAB — HEPATIC FUNCTION PANEL
ALT: 33 IU/L (ref 0–44)
AST: 26 IU/L (ref 0–40)
Albumin: 4.2 g/dL (ref 3.8–4.8)
Alkaline Phosphatase: 94 IU/L (ref 44–121)
Bilirubin Total: 0.4 mg/dL (ref 0.0–1.2)
Bilirubin, Direct: 0.19 mg/dL (ref 0.00–0.40)
Total Protein: 6.2 g/dL (ref 6.0–8.5)

## 2022-06-18 NOTE — Progress Notes (Signed)
Cardiac Individual Treatment Plan  Patient Details  Name: Ross [name] MRN: [id number] Date of Birth: 06/28/1956 Referring Provider:   Flowsheet Row INTENSIVE CARDIAC REHAB ORIENT from 06/02/2022 in [facility]  Referring Provider Belva Crome, MD.       Initial Encounter Date:  [place] from 06/02/2022 in [facility]  Date 06/02/22       Visit Diagnosis: Chronic stable angina ([place])  Patient's Home Medications on Admission:  Current Outpatient Medications:    ALPRAZolam (XANAX) 0.5 MG tablet, Take 1-2 tablets (0.5-1 mg total) by mouth at bedtime. (Patient taking differently: Take 1 mg by mouth at bedtime.), Disp: 62 tablet, Rfl: 1   amiodarone (PACERONE) 200 MG tablet, Take 1 tablet (200 mg total) by mouth daily., Disp: 90 tablet, Rfl: 3   apixaban (ELIQUIS) 5 MG TABS tablet, Take 1 tablet (5 mg total) by mouth 2 (two) times daily., Disp: 60 tablet, Rfl: 5   aspirin 81 MG chewable tablet, Chew 1 tablet (81 mg total) by mouth daily., Disp: , Rfl:    atorvastatin (LIPITOR) 80 MG tablet, Take 1 tablet (80 mg total) by mouth daily., Disp: 90 tablet, Rfl: 3   budesonide-formoterol (SYMBICORT) 160-4.5 MCG/ACT inhaler, Inhale 2 puffs into the lungs 2 (two) times daily as needed (shortness of breath)., Disp: , Rfl:    carvedilol (COREG) 6.25 MG tablet, Take 1 tablet (6.25 mg total) by mouth 2 (two) times daily with a meal., Disp: 180 tablet, Rfl: 3   dapagliflozin propanediol (FARXIGA) 10 MG TABS tablet, Take 1 tablet (10 mg total) by mouth daily before breakfast. (Patient taking differently: Take 10 mg by mouth daily in the afternoon.), Disp: 90 tablet, Rfl: 3   gabapentin (NEURONTIN) 300 MG capsule, Take 1 capsule (300 mg total) by mouth daily., Disp: 30 capsule, Rfl: 3   gabapentin (NEURONTIN) 300 MG capsule, Take 1 capsule (300 mg total) by mouth 2 (two) times daily., Disp: 60 capsule, Rfl:  5   hydrALAZINE (APRESOLINE) 25 MG tablet, Take 1 tablet (25 mg total) by mouth 3 (three) times daily., Disp: 180 tablet, Rfl: 1   isosorbide dinitrate (ISORDIL) 20 MG tablet, Take 1 tablet (20 mg total) by mouth 3 (three) times daily., Disp: 270 tablet, Rfl: 2   linaclotide (LINZESS) 145 MCG CAPS capsule, Take 145 mcg by mouth daily as needed (constipation)., Disp: , Rfl:    pantoprazole (PROTONIX) 40 MG tablet, Take 1 tablet (40 mg total) by mouth daily., Disp: 30 tablet, Rfl: 3   polyethylene glycol powder (GLYCOLAX/MIRALAX) 17 GM/SCOOP powder, Take 17 g by mouth 2 (two) times daily. (Patient taking differently: Take 17 g by mouth daily as needed (constipation.).), Disp: 510 g, Rfl: 0   senna (SENOKOT) 8.6 MG TABS tablet, Take 2 tablets by mouth at bedtime., Disp: , Rfl:    sodium chloride (OCEAN) 0.65 % SOLN nasal spray, Place 1 spray into both nostrils as needed for congestion. (Patient taking differently: Place 1 spray into both nostrils 2 (two) times daily as needed for congestion.), Disp: , Rfl: 0   Tetrahydrozoline HCl (EYE DROPS REGULAR OP), Place 1 drop into both eyes daily as needed (irritation)., Disp: , Rfl:    vitamin C (ASCORBIC ACID) 500 MG tablet, Take 500 mg by mouth daily., Disp: , Rfl:    white petrolatum (VASELINE) GEL, Apply 1 application  topically as needed (wound care)., Disp: , Rfl:   Past Medical History:  Past Medical History:  Diagnosis Date   Anxiety    Arthritis    "all over my body"   Atrial fibrillation ([place])    rate control strategy; LA large   CAD (coronary artery disease) of artery bypass graft    Cardiac arrest ([place]) 05/31/2021   Carpal tunnel syndrome    Cerebral aneurysm dx'd 3/74/8270   Complication of anesthesia    "I' was told that I'm a very high risk to be put to sleep; I may not come out of it" (01/16/2016)   COPD (chronic obstructive pulmonary disease) ([place])    Coronary artery disease    a. s/p CABG;  b. Myoview (7/13):  apical anterior  ischemia; c. LHC (08/2012): dLM 60%, mid LAD 90%, pCFX 40%, OM1 occluded, RCA 80-90%, distal RCA 60-70%, SVG-Dx patent, SVG-OM1 patent, LIMA-LAD patent, SVG-PDA occluded, EF 55%. => RCA dsz too long and would req multiple stents; pt refused CABG => med Rx.   Depression    DJD (degenerative joint disease)    Dyslipidemia    Dysrhythmia    Hx of echocardiogram    a. Echocardiogram (05/2013): Mild LVH, EF 54.8%, moderate LAE, mild aortic root dilatation, trace pulmonic regurgitation   Hyperlipidemia    Hypertension    Ischemic cardiomyopathy    Obesity    Peripheral neuropathy    Permanent atrial fibrillation (H[facility])    Presence of permanent cardiac pacemaker    Type II diabetes mellitus ([place])    "haven't taken RX for 7-8 years" (01/16/2016)   Ventricular fibrillation ([place]) 05/31/2021    Tobacco Use: Social History   Tobacco Use  Smoking Status Former   Packs/day: 1.50   Years: 45.00   Total pack years: 67.50   Types: Cigarettes   Quit date: 10/2020   Years since quitting: 1.6  Smokeless Tobacco Never    Labs: Review Flowsheet       Latest Ref Rng & Units 10/24/2015 05/31/2021 06/01/2021  Labs for ITP Cardiac and Pulmonary Rehab  Cholestrol 125 - 200 mg/dL 96  - -  LDL (calc) <130 mg/dL 33  - -  HDL-C >=40 mg/dL 39  - -  Trlycerides <150 mg/dL 120  - -  Hemoglobin A1c 4.8 - 5.6 % - - 5.6   PH, Arterial 7.350 - 7.450 - 7.275  7.216  7.172  7.088  7.356   PCO2 arterial 32.0 - 48.0 mmHg - 38.2  51.7  48.5  43.5  29.9   Bicarbonate 20.0 - 28.0 mmol/L - 17.6  21.0  17.8  13.5  16.7   TCO2 22 - 32 mmol/L - $Remove'19  23  22  19  15  13  18   'IMYclPU$ Acid-base deficit 0.0 - 2.0 mmol/L - 8.0  7.0  11.0  16.0  7.0   O2 Saturation % - 99.0  100.0  100.0  100.0  97.0     Capillary Blood Glucose: Lab Results  Component Value Date   GLUCAP 96 06/12/2022   GLUCAP 95 06/10/2022   GLUCAP 82 06/10/2022   GLUCAP 91 06/08/2022   GLUCAP 109 (H) 06/08/2022     Exercise Target Goals: Exercise Program  Goal: Individual exercise prescription set using results from initial 6 min walk test and THRR while considering  patient's activity barriers and safety.   Exercise Prescription Goal: Initial exercise prescription builds to 30-45 minutes a day of aerobic activity, 2-3 days per week.  Home exercise guidelines will be given to patient during program as part  of exercise prescription that the participant will acknowledge.  Activity Barriers & Risk Stratification:  Activity Barriers & Cardiac Risk Stratification - 06/02/22 1317       Activity Barriers & Cardiac Risk Stratification   Activity Barriers Joint Problems;Arthritis;Other (comment);Balance Concerns    Comments Generalized joint pain, bilateral hip pain, peripheral neuropathy, dizziness.    Cardiac Risk Stratification High             6 Minute Walk:  6 Minute Walk     Row Name 06/02/22 1620         6 Minute Walk   Phase Initial     Distance 0.39 feet  miles     Walk Time 6 minutes     # of Rest Breaks 3     MPH 3.85     METS 3.2     RPE 14     Perceived Dyspnea  2.5     Symptoms Yes (comment)     Comments Bilateral hip pain, 8/10, generalized, overall pain, mild to moderate SOB, RPD=2.5     Resting HR 85 bpm     Resting BP 98/64     Resting Oxygen Saturation  95 %     Exercise Oxygen Saturation  during 6 min walk 99 %     Max Ex. HR 87 bpm     Max Ex. BP 108/50     2 Minute Post BP 118/62              Oxygen Initial Assessment:   Oxygen Re-Evaluation:   Oxygen Discharge (Final Oxygen Re-Evaluation):   Initial Exercise Prescription:  Initial Exercise Prescription - 06/02/22 1600       Date of Initial Exercise RX and Referring Provider   Date 06/02/22    Referring Provider Belva Crome, MD.    Expected Discharge Date 07/31/22      T5 Nustep   Level 1    SPM 85    Minutes 25    METs 2.2      Prescription Details   Frequency (times per week) 3    Duration Progress to 30 minutes of  continuous aerobic without signs/symptoms of physical distress      Intensity   THRR 40-80% of Max Heartrate 61-122    Ratings of Perceived Exertion 11-13    Perceived Dyspnea 0-4      Progression   Progression Continue to progress workloads to maintain intensity without signs/symptoms of physical distress.      Resistance Training   Training Prescription Yes    Weight 2 lbs    Reps 10-15             Perform Capillary Blood Glucose checks as needed.  Exercise Prescription Changes:   Exercise Prescription Changes     Row Name 06/08/22 1632             Response to Exercise   Blood Pressure (Admit) 118/72       Blood Pressure (Exercise) 122/60       Blood Pressure (Exit) 118/64       Heart Rate (Admit) 80 bpm       Heart Rate (Exercise) 103 bpm       Heart Rate (Exit) 61 bpm       Rating of Perceived Exertion (Exercise) 15       Perceived Dyspnea (Exercise) 0       Symptoms 0       Comments Pt first day in  the CRP2 program       Duration Progress to 30 minutes of  aerobic without signs/symptoms of physical distress       Intensity THRR unchanged         Progression   Progression Continue to progress workloads to maintain intensity without signs/symptoms of physical distress.       Average METs 2         Resistance Training   Training Prescription Yes       Weight 3 lbs       Reps 10-15       Time 10 Minutes         T5 Nustep   Level 1       SPM 85       Minutes 25       METs 2                Exercise Comments:   Exercise Comments     Row Name 06/08/22 1637           Exercise Comments pt first day in the [place] program. Pt is tolerating low intensity exercise well with some balance concerns and an average MET level of 2.0. Pt is learning his RPE, THRR and ExRx                Exercise Goals and Review:   Exercise Goals     Row Name 06/02/22 1317             Exercise Goals   Increase Physical Activity Yes       Intervention  Provide advice, education, support and counseling about physical activity/exercise needs.;Develop an individualized exercise prescription for aerobic and resistive training based on initial evaluation findings, risk stratification, comorbidities and participant's personal goals.       Expected Outcomes Short Term: Attend rehab on a regular basis to increase amount of physical activity.;Long Term: Exercising regularly at least 3-5 days a week.;Long Term: Add in home exercise to make exercise part of routine and to increase amount of physical activity.       Increase Strength and Stamina Yes       Intervention Provide advice, education, support and counseling about physical activity/exercise needs.;Develop an individualized exercise prescription for aerobic and resistive training based on initial evaluation findings, risk stratification, comorbidities and participant's personal goals.       Expected Outcomes Short Term: Increase workloads from initial exercise prescription for resistance, speed, and METs.;Short Term: Perform resistance training exercises routinely during rehab and add in resistance training at home;Long Term: Improve cardiorespiratory fitness, muscular endurance and strength as measured by increased METs and functional capacity (6MWT)       Able to understand and use rate of perceived exertion (RPE) scale Yes       Intervention Provide education and explanation on how to use RPE scale       Expected Outcomes Short Term: Able to use RPE daily in rehab to express subjective intensity level;Long Term:  Able to use RPE to guide intensity level when exercising independently       Knowledge and understanding of Target Heart Rate Range (THRR) Yes       Intervention Provide education and explanation of THRR including how the numbers were predicted and where they are located for reference       Expected Outcomes Short Term: Able to state/look up THRR;Long Term: Able to use THRR to govern intensity  when exercising independently;Short Term: Able to  use daily as guideline for intensity in rehab       Understanding of Exercise Prescription Yes       Intervention Provide education, explanation, and written materials on patient's individual exercise prescription       Expected Outcomes Short Term: Able to explain program exercise prescription;Long Term: Able to explain home exercise prescription to exercise independently                Exercise Goals Re-Evaluation :  Exercise Goals Re-Evaluation     [place] Name 06/08/22 1635             Exercise Goal Re-Evaluation   Exercise Goals Review Increase Physical Activity;Increase Strength and Stamina;Able to understand and use rate of perceived exertion (RPE) scale;Knowledge and understanding of Target Heart Rate Range (THRR);Understanding of Exercise Prescription       Comments pt first day in the CRP2 program. Pt is tolerating low intensity exercise well with some balance concerns and an average MET level of 2.0. Pt is learning his RPE, THRR and ExRx       Expected Outcomes Will continue to monitor pt and progress workloads as tolerated without sign or symptom                Discharge Exercise Prescription (Final Exercise Prescription Changes):  Exercise Prescription Changes - 06/08/22 1632       Response to Exercise   Blood Pressure (Admit) 118/72    Blood Pressure (Exercise) 122/60    Blood Pressure (Exit) 118/64    Heart Rate (Admit) 80 bpm    Heart Rate (Exercise) 103 bpm    Heart Rate (Exit) 61 bpm    Rating of Perceived Exertion (Exercise) 15    Perceived Dyspnea (Exercise) 0    Symptoms 0    Comments Pt first day in the CRP2 program    Duration Progress to 30 minutes of  aerobic without signs/symptoms of physical distress    Intensity THRR unchanged      Progression   Progression Continue to progress workloads to maintain intensity without signs/symptoms of physical distress.    Average METs 2      Resistance  Training   Training Prescription Yes    Weight 3 lbs    Reps 10-15    Time 10 Minutes      T5 Nustep   Level 1    SPM 85    Minutes 25    METs 2             Nutrition:  Target Goals: Understanding of nutrition guidelines, daily intake of sodium '1500mg'$ , cholesterol '200mg'$ , calories 30% from fat and 7% or less from saturated fats, daily to have 5 or more servings of fruits and vegetables.  Biometrics:  Pre Biometrics - 06/02/22 1428       Pre Biometrics   Waist Circumference 49 inches    Hip Circumference 49.75 inches    Waist to Hip Ratio 0.98 %    Triceps Skinfold 26 mm    % Body Fat 36.6 %    Grip Strength 32 kg    Flexibility --   Not performed, c/o bilateral hip pain.             Nutrition Therapy Plan and Nutrition Goals:  Nutrition Therapy & Goals - 06/08/22 1608       Nutrition Therapy   Diet Heart Healthy Diet    Drug/Food Interactions Statins/Certain Fruits      Personal Nutrition Goals  Nutrition Goal Patient to choose a variety of fruits, vegetables, whole grains, lean protein/plant protein, and nonfat dairy daily as part of a heart healthy diet    Personal Goal #2 Patient to limit to 1500mg  sodium per day    Personal Goal #3 Patient to identify and limit daily food sources of sodium, trans fats, saturated fat, and refined carbohydrates      Intervention Plan   Intervention Prescribe, educate and counsel regarding individualized specific dietary modifications aiming towards targeted core components such as weight, hypertension, lipid management, diabetes, heart failure and other comorbidities.;Nutrition handout(s) given to patient.    Expected Outcomes Short Term Goal: Understand basic principles of dietary content, such as calories, fat, sodium, cholesterol and nutrients.;Short Term Goal: A plan has been developed with personal nutrition goals set during dietitian appointment.;Long Term Goal: Adherence to prescribed nutrition plan.              Nutrition Assessments:  Nutrition Assessments - 06/12/22 1525       Rate Your Plate Scores   Pre Score 57            MEDIFICTS Score Key: ?70 Need to make dietary changes  40-70 Heart Healthy Diet ? 40 Therapeutic Level Cholesterol Diet   Flowsheet Row INTENSIVE CARDIAC REHAB from 06/12/2022 in [facility] CARDIAC REHAB  Picture Your Plate Total Score on Admission 57      Picture Your Plate Scores: <90 Unhealthy dietary pattern with much room for improvement. 41-50 Dietary pattern unlikely to meet recommendations for good health and room for improvement. 51-60 More healthful dietary pattern, with some room for improvement.  >60 Healthy dietary pattern, although there may be some specific behaviors that could be improved.    Nutrition Goals Re-Evaluation:   Nutrition Goals Re-Evaluation:   Nutrition Goals Discharge (Final Nutrition Goals Re-Evaluation):   Psychosocial: Target Goals: Acknowledge presence or absence of significant depression and/or stress, maximize coping skills, provide positive support system. Participant is able to verbalize types and ability to use techniques and skills needed for reducing stress and depression.  Initial Review & Psychosocial Screening:  Initial Psych Review & Screening - 06/02/22 1435       Initial Review   Current issues with Current Stress Concerns;History of Depression;Current Sleep Concerns   Shep takes Xanax for sleep.   Source of Stress Concerns Unable to perform yard/household activities;Unable to participate in former interests or hobbies    Comments Dywane is concerned about his health      Family Dynamics   Good Support System? Yes   Christian has his fiance and his daughter for support     Barriers   Psychosocial barriers to participate in program The patient should benefit from training in stress management and relaxation.      Screening Interventions   Interventions Encouraged to exercise;To  provide support and resources with identified psychosocial needs;Provide feedback about the scores to participant    Expected Outcomes Long Term Goal: Stressors or current issues are controlled or eliminated.;Short Term goal: Identification and review with participant of any Quality of Life or Depression concerns found by scoring the questionnaire.;Long Term goal: The participant improves quality of Life and PHQ9 Scores as seen by post scores and/or verbalization of changes             Quality of Life Scores:  Quality of Life - 06/02/22 1632       Quality of Life   Select Quality of Life  Quality of Life Scores   Health/Function Pre 13.71 %    Socioeconomic Pre 22.5 %    Psych/Spiritual Pre 24 %    Family Pre 30 %    GLOBAL Pre 20.29 %            Scores of 19 and below usually indicate a poorer quality of life in these areas.  A difference of  2-3 points is a clinically meaningful difference.  A difference of 2-3 points in the total score of the Quality of Life Index has been associated with significant improvement in overall quality of life, self-image, physical symptoms, and general health in studies assessing change in quality of life.  PHQ-9: Review Flowsheet       06/02/2022  Depression screen PHQ 2/9  Decreased Interest 0  Down, Depressed, Hopeless 0  PHQ - 2 Score 0   Interpretation of Total Score  Total Score Depression Severity:  1-4 = Minimal depression, 5-9 = Mild depression, 10-14 = Moderate depression, 15-19 = Moderately severe depression, 20-27 = Severe depression   Psychosocial Evaluation and Intervention:   Psychosocial Re-Evaluation:  Psychosocial Re-Evaluation     [place] Name 06/10/22 0926             Psychosocial Re-Evaluation   Current issues with Current Stress Concerns;Current Sleep Concerns;History of Depression       Comments Reviewed Nayef's quality of life questionnire he is dissatisfied with his health due to recent events        Expected Outcomes Rube will have decreased stress, better coping mechanisms upon completion of phase 2 cardiac rehab       Interventions Stress management education;Relaxation education;Encouraged to attend Cardiac Rehabilitation for the exercise         Initial Review   Source of Stress Concerns Chronic Illness;Poor Coping Skills;Unable to perform yard/household activities;Unable to participate in former interests or hobbies       Comments Will continue to monitor and offer support as needed                Psychosocial Discharge (Final Psychosocial Re-Evaluation):  Psychosocial Re-Evaluation - 06/10/22 0926       Psychosocial Re-Evaluation   Current issues with Current Stress Concerns;Current Sleep Concerns;History of Depression    Comments Reviewed Eligah's quality of life questionnire he is dissatisfied with his health due to recent events    Expected Outcomes Ashyr will have decreased stress, better coping mechanisms upon completion of phase 2 cardiac rehab    Interventions Stress management education;Relaxation education;Encouraged to attend Cardiac Rehabilitation for the exercise      Initial Review   Source of Stress Concerns Chronic Illness;Poor Coping Skills;Unable to perform yard/household activities;Unable to participate in former interests or hobbies    Comments Will continue to monitor and offer support as needed             Vocational Rehabilitation: Provide vocational rehab assistance to qualifying candidates.   Vocational Rehab Evaluation & Intervention:  Vocational Rehab - 06/02/22 1436       Initial Vocational Rehab Evaluation & Intervention   Assessment shows need for Vocational Rehabilitation No   Sims is retired and does not need vocational rehab at this time            Education: Education Goals: Education classes will be provided on a weekly basis, covering required topics. Participant will state understanding/return demonstration of topics  presented.    Education - 06/17/22 1500       Education  Cardiac Education Topics Pritikin    Sales executive    Weekly Topic Satisfying Salads and Dressings    Instruction Review Code 1- Verbalizes Understanding    Class Start Time 1345    Class Stop Time 1445    Class Time Calculation (min) 60 min             Core Videos: Exercise    Move It!  Clinical staff conducted group or individual video education with verbal and written material and guidebook.  Patient learns the recommended Pritikin exercise program. Exercise with the goal of living a long, healthy life. Some of the health benefits of exercise include controlled diabetes, healthier blood pressure levels, improved cholesterol levels, improved heart and lung capacity, improved sleep, and better body composition. Everyone should speak with their doctor before starting or changing an exercise routine.  Biomechanical Limitations Clinical staff conducted group or individual video education with verbal and written material and guidebook.  Patient learns how biomechanical limitations can impact exercise and how we can mitigate and possibly overcome limitations to have an impactful and balanced exercise routine.  Body Composition Clinical staff conducted group or individual video education with verbal and written material and guidebook.  Patient learns that body composition (ratio of muscle mass to fat mass) is a key component to assessing overall fitness, rather than body weight alone. Increased fat mass, especially visceral belly fat, can put [place] at increased risk for metabolic syndrome, type 2 diabetes, heart disease, and even death. It is recommended to combine diet and exercise (cardiovascular and resistance training) to improve your body composition. Seek guidance from your physician and exercise physiologist before implementing an exercise routine.  Exercise Action  Plan Clinical staff conducted group or individual video education with verbal and written material and guidebook.  Patient learns the recommended strategies to achieve and enjoy long-term exercise adherence, including variety, self-motivation, self-efficacy, and positive decision making. Benefits of exercise include fitness, good health, weight management, more energy, better sleep, less stress, and overall well-being.  Medical   Heart Disease Risk Reduction Clinical staff conducted group or individual video education with verbal and written material and guidebook.  Patient learns our heart is our most vital organ as it circulates oxygen, nutrients, white blood cells, and hormones throughout the entire body, and carries waste away. Data supports a plant-based eating plan like the Pritikin Program for its effectiveness in slowing progression of and reversing heart disease. The video provides a number of recommendations to address heart disease.   Metabolic Syndrome and Belly Fat  Clinical staff conducted group or individual video education with verbal and written material and guidebook.  Patient learns what metabolic syndrome is, how it leads to heart disease, and how one can reverse it and keep it from coming back. You have metabolic syndrome if you have 3 of the following 5 criteria: abdominal obesity, high blood pressure, high triglycerides, low HDL cholesterol, and high blood sugar.  Hypertension and Heart Disease Clinical staff conducted group or individual video education with verbal and written material and guidebook.  Patient learns that high blood pressure, or hypertension, is very common in the [place]. Hypertension is largely due to excessive salt intake, but other important risk factors include being overweight, physical inactivity, drinking too much alcohol, smoking, and not eating enough potassium from fruits and vegetables. High blood pressure is a leading risk factor for heart  attack, stroke, congestive heart failure,  dementia, kidney failure, and premature death. Long-term effects of excessive salt intake include stiffening of the arteries and thickening of heart muscle and organ damage. Recommendations include ways to reduce hypertension and the risk of heart disease.  Diseases of Our Time - Focusing on Diabetes Clinical staff conducted group or individual video education with verbal and written material and guidebook.  Patient learns why the best way to stop diseases of our time is prevention, through food and other lifestyle changes. Medicine (such as prescription pills and surgeries) is often only a Band-Aid on the problem, not a long-term solution. Most common diseases of our time include obesity, type 2 diabetes, hypertension, heart disease, and cancer. The Pritikin Program is recommended and has been proven to help reduce, reverse, and/or prevent the damaging effects of metabolic syndrome.  Nutrition   Overview of the Pritikin Eating Plan  Clinical staff conducted group or individual video education with verbal and written material and guidebook.  Patient learns about the [place] for disease risk reduction. The [place] emphasizes a wide variety of unrefined, minimally-processed carbohydrates, like fruits, vegetables, whole grains, and legumes. Go, Caution, and Stop food choices are explained. Plant-based and lean animal proteins are emphasized. Rationale provided for low sodium intake for blood pressure control, low added sugars for blood sugar stabilization, and low added fats and oils for coronary artery disease risk reduction and weight management.  Calorie Density  Clinical staff conducted group or individual video education with verbal and written material and guidebook.  Patient learns about calorie density and how it impacts the Pritikin Eating Plan. Knowing the characteristics of the food you choose will help you decide whether those  foods will lead to weight gain or weight loss, and whether you want to consume more or less of them. Weight loss is usually a side effect of the Pritikin Eating Plan because of its focus on low calorie-dense foods.  Label Reading  Clinical staff conducted group or individual video education with verbal and written material and guidebook.  Patient learns about the Pritikin recommended label reading guidelines and corresponding recommendations regarding calorie density, added sugars, sodium content, and whole grains.  Dining Out - Part 1  Clinical staff conducted group or individual video education with verbal and written material and guidebook.  Patient learns that restaurant meals can be sabotaging because they can be so high in calories, fat, sodium, and/or sugar. Patient learns recommended strategies on how to positively address this and avoid unhealthy pitfalls.  Facts on Fats  Clinical staff conducted group or individual video education with verbal and written material and guidebook.  Patient learns that lifestyle modifications can be just as effective, if not more so, as many medications for lowering your risk of heart disease. A Pritikin lifestyle can help to reduce your risk of inflammation and atherosclerosis (cholesterol build-up, or plaque, in the artery walls). Lifestyle interventions such as dietary choices and physical activity address the cause of atherosclerosis. A review of the types of fats and their impact on blood cholesterol levels, along with dietary recommendations to reduce fat intake is also included.  Nutrition Action Plan  Clinical staff conducted group or individual video education with verbal and written material and guidebook.  Patient learns how to incorporate Pritikin recommendations into their lifestyle. Recommendations include planning and keeping personal health goals in mind as an important part of their success.  Healthy Mind-Set    Healthy Minds, Bodies, Hearts   Clinical staff conducted group or individual  video education with verbal and written material and guidebook.  Patient learns how to identify when they are stressed. Video will discuss the impact of that stress, as well as the many benefits of stress management. Patient will also be introduced to stress management techniques. The way we think, act, and feel has an impact on our hearts.  How Our Thoughts Can Heal Our Hearts  Clinical staff conducted group or individual video education with verbal and written material and guidebook.  Patient learns that negative thoughts can cause depression and anxiety. This can result in negative lifestyle behavior and serious health problems. Cognitive behavioral therapy is an effective method to help control our thoughts in order to change and improve our emotional outlook.  Additional Videos:  Exercise    Improving Performance  Clinical staff conducted group or individual video education with verbal and written material and guidebook.  Patient learns to use a non-linear approach by alternating intensity levels and lengths of time spent exercising to help burn more calories and lose more body fat. Cardiovascular exercise helps improve heart health, metabolism, hormonal balance, blood sugar control, and recovery from fatigue. Resistance training improves strength, endurance, balance, coordination, reaction time, metabolism, and muscle mass. Flexibility exercise improves circulation, posture, and balance. Seek guidance from your physician and exercise physiologist before implementing an exercise routine and learn your capabilities and proper form for all exercise.  Introduction to Yoga  Clinical staff conducted group or individual video education with verbal and written material and guidebook.  Patient learns about yoga, a discipline of the coming together of mind, breath, and body. The benefits of yoga include improved flexibility, improved range of motion, better  posture and core strength, increased lung function, weight loss, and positive self-image. Yoga's heart health benefits include lowered blood pressure, healthier heart rate, decreased cholesterol and triglyceride levels, improved immune function, and reduced stress. Seek guidance from your physician and exercise physiologist before implementing an exercise routine and learn your capabilities and proper form for all exercise.  Medical   Aging: Enhancing Your Quality of Life  Clinical staff conducted group or individual video education with verbal and written material and guidebook.  Patient learns key strategies and recommendations to stay in good physical health and enhance quality of life, such as prevention strategies, having an advocate, securing a Health Care Proxy and Power of Attorney, and keeping a list of medications and system for tracking them. It also discusses how to avoid risk for bone loss.  Biology of Weight Control  Clinical staff conducted group or individual video education with verbal and written material and guidebook.  Patient learns that weight gain occurs because we consume more calories than we burn (eating more, moving less). Even if your body weight is normal, you may have higher ratios of fat compared to muscle mass. Too much body fat puts you at increased risk for cardiovascular disease, heart attack, stroke, type 2 diabetes, and obesity-related cancers. In addition to exercise, following the Pritikin Eating Plan can help reduce your risk.  Decoding Lab Results  Clinical staff conducted group or individual video education with verbal and written material and guidebook.  Patient learns that lab test reflects one measurement whose values change over time and are influenced by many factors, including medication, stress, sleep, exercise, food, hydration, pre-existing medical conditions, and more. It is recommended to use the knowledge from this video to become more involved with  your lab results and evaluate your numbers to speak with your doctor.  Diseases of Our Time - Overview  Clinical staff conducted group or individual video education with verbal and written material and guidebook.  Patient learns that according to the CDC, 50% to 70% of chronic diseases (such as obesity, type 2 diabetes, elevated lipids, hypertension, and heart disease) are avoidable through lifestyle improvements including healthier food choices, listening to satiety cues, and increased physical activity.  Sleep Disorders Clinical staff conducted group or individual video education with verbal and written material and guidebook.  Patient learns how good quality and duration of sleep are important to overall health and well-being. Patient also learns about sleep disorders and how they impact health along with recommendations to address them, including discussing with a physician.  Nutrition  Dining Out - Part 2 Clinical staff conducted group or individual video education with verbal and written material and guidebook.  Patient learns how to plan ahead and communicate in order to maximize their dining experience in a healthy and nutritious manner. Included are recommended food choices based on the type of restaurant the patient is visiting.   Fueling a Best boy conducted group or individual video education with verbal and written material and guidebook.  There is a strong connection between our food choices and our health. Diseases like obesity and type 2 diabetes are very prevalent and are in large-part due to lifestyle choices. The Pritikin Eating Plan provides plenty of food and hunger-curbing satisfaction. It is easy to follow, affordable, and helps reduce health risks.  Menu Workshop  Clinical staff conducted group or individual video education with verbal and written material and guidebook.  Patient learns that restaurant meals can sabotage health goals because they are  often packed with calories, fat, sodium, and sugar. Recommendations include strategies to plan ahead and to communicate with the manager, chef, or server to help order a healthier meal.  Planning Your Eating Strategy  Clinical staff conducted group or individual video education with verbal and written material and guidebook.  Patient learns about the [place] and its benefit of reducing the risk of disease. The [place] does not focus on calories. Instead, it emphasizes high-quality, nutrient-rich foods. By knowing the characteristics of the foods, we choose, we can determine their calorie density and make informed decisions.  Targeting Your Nutrition Priorities  Clinical staff conducted group or individual video education with verbal and written material and guidebook.  Patient learns that lifestyle habits have a tremendous impact on disease risk and progression. This video provides eating and physical activity recommendations based on your personal health goals, such as reducing LDL cholesterol, losing weight, preventing or controlling type 2 diabetes, and reducing high blood pressure.  Vitamins and Minerals  Clinical staff conducted group or individual video education with verbal and written material and guidebook.  Patient learns different ways to obtain key vitamins and minerals, including through a recommended healthy diet. It is important to discuss all supplements you take with your doctor.   Healthy Mind-Set    Smoking Cessation  Clinical staff conducted group or individual video education with verbal and written material and guidebook.  Patient learns that cigarette smoking and tobacco addiction pose a serious health risk which affects millions of people. Stopping smoking will significantly reduce the risk of heart disease, lung disease, and many forms of cancer. Recommended strategies for quitting are covered, including working with your doctor to develop a  successful plan.  Culinary   Becoming a Financial trader conducted group or  individual video education with verbal and written material and guidebook.  Patient learns that cooking at home can be healthy, cost-effective, quick, and puts them in control. Keys to cooking healthy recipes will include looking at your recipe, assessing your equipment needs, planning ahead, making it simple, choosing cost-effective seasonal ingredients, and limiting the use of added fats, salts, and sugars.  Cooking - Breakfast and Snacks  Clinical staff conducted group or individual video education with verbal and written material and guidebook.  Patient learns how important breakfast is to satiety and nutrition through the entire day. Recommendations include key foods to eat during breakfast to help stabilize blood sugar levels and to prevent overeating at meals later in the day. Planning ahead is also a key component.  Cooking - Human resources officer conducted group or individual video education with verbal and written material and guidebook.  Patient learns eating strategies to improve overall health, including an approach to cook more at home. Recommendations include thinking of animal protein as a side on your plate rather than center stage and focusing instead on lower calorie dense options like vegetables, fruits, whole grains, and plant-based proteins, such as beans. Making sauces in large quantities to freeze for later and leaving the skin on your vegetables are also recommended to maximize your experience.  Cooking - Healthy Salads and Dressing Clinical staff conducted group or individual video education with verbal and written material and guidebook.  Patient learns that vegetables, fruits, whole grains, and legumes are the foundations of the [place]. Recommendations include how to incorporate each of these in flavorful and healthy salads, and how to create homemade salad  dressings. Proper handling of ingredients is also covered. Cooking - Soups and [employer] - Soups and Desserts Clinical staff conducted group or individual video education with verbal and written material and guidebook.  Patient learns that Pritikin soups and desserts make for easy, nutritious, and delicious snacks and meal components that are low in sodium, fat, sugar, and calorie density, while high in vitamins, minerals, and filling fiber. Recommendations include simple and healthy ideas for soups and desserts.   Overview     The Pritikin Solution Program Overview Clinical staff conducted group or individual video education with verbal and written material and guidebook.  Patient learns that the results of the [place] Program have been documented in more than 100 articles published in peer-reviewed journals, and the benefits include reducing risk factors for (and, in some cases, even reversing) high cholesterol, high blood pressure, type 2 diabetes, obesity, and more! An overview of the three key pillars of the Pritikin Program will be covered: eating well, doing regular exercise, and having a healthy mind-set.  WORKSHOPS  Exercise: Exercise Basics: Building Your Action Plan Clinical staff led group instruction and group discussion with PowerPoint presentation and patient guidebook. To enhance the learning environment the use of posters, models and videos may be added. At the conclusion of this workshop, patients will comprehend the difference between physical activity and exercise, as well as the benefits of incorporating both, into their routine. Patients will understand the FITT (Frequency, Intensity, Time, and Type) principle and how to use it to build an exercise action plan. In addition, safety concerns and other considerations for exercise and cardiac rehab will be addressed by the presenter. The purpose of this lesson is to promote a comprehensive and effective weekly exercise  routine in order to improve patients' overall level of fitness.   Managing Heart Disease:  Your Path to a Healthier Heart Clinical staff led group instruction and group discussion with PowerPoint presentation and patient guidebook. To enhance the learning environment the use of posters, models and videos may be added.At the conclusion of this workshop, patients will understand the anatomy and physiology of the heart. Additionally, they will understand how Pritikin's three pillars impact the risk factors, the progression, and the management of heart disease.  The purpose of this lesson is to provide a high-level overview of the heart, heart disease, and how the Pritikin lifestyle positively impacts risk factors.  Exercise Biomechanics Clinical staff led group instruction and group discussion with PowerPoint presentation and patient guidebook. To enhance the learning environment the use of posters, models and videos may be added. Patients will learn how the structural parts of their bodies function and how these functions impact their daily activities, movement, and exercise. Patients will learn how to promote a neutral spine, learn how to manage pain, and identify ways to improve their physical movement in order to promote healthy living. The purpose of this lesson is to expose patients to common physical limitations that impact physical activity. Participants will learn practical ways to adapt and manage aches and pains, and to minimize their effect on regular exercise. Patients will learn how to maintain good posture while sitting, walking, and lifting.  Balance Training and Fall Prevention  Clinical staff led group instruction and group discussion with PowerPoint presentation and patient guidebook. To enhance the learning environment the use of posters, models and videos may be added. At the conclusion of this workshop, patients will understand the importance of their sensorimotor skills  (vision, proprioception, and the vestibular system) in maintaining their ability to balance as they age. Patients will apply a variety of balancing exercises that are appropriate for their current level of function. Patients will understand the common causes for poor balance, possible solutions to these problems, and ways to modify their physical environment in order to minimize their fall risk. The purpose of this lesson is to teach patients about the importance of maintaining balance as they age and ways to minimize their risk of falling.  WORKSHOPS   Nutrition:  Fueling a Scientist, research (physical sciences) led group instruction and group discussion with PowerPoint presentation and patient guidebook. To enhance the learning environment the use of posters, models and videos may be added. Patients will review the foundational principles of the [place] and understand what constitutes a serving size in each of the food groups. Patients will also learn Pritikin-friendly foods that are better choices when away from home and review make-ahead meal and snack options. Calorie density will be reviewed and applied to three nutrition priorities: weight maintenance, weight loss, and weight gain. The purpose of this lesson is to reinforce (in a group setting) the key concepts around what patients are recommended to eat and how to apply these guidelines when away from home by planning and selecting Pritikin-friendly options. Patients will understand how calorie density may be adjusted for different weight management goals.  Mindful Eating  Clinical staff led group instruction and group discussion with PowerPoint presentation and patient guidebook. To enhance the learning environment the use of posters, models and videos may be added. Patients will briefly review the concepts of the [place] and the importance of low-calorie dense foods. The concept of mindful eating will be introduced as well as the  importance of paying attention to internal hunger signals. Triggers for non-hunger eating and techniques for dealing  with triggers will be explored. The purpose of this lesson is to provide patients with the opportunity to review the basic principles of the [place], discuss the value of eating mindfully and how to measure internal cues of hunger and fullness using the Hunger Scale. Patients will also discuss reasons for non-hunger eating and learn strategies to use for controlling emotional eating.  Targeting Your Nutrition Priorities Clinical staff led group instruction and group discussion with PowerPoint presentation and patient guidebook. To enhance the learning environment the use of posters, models and videos may be added. Patients will learn how to determine their genetic susceptibility to disease by reviewing their family history. Patients will gain insight into the importance of diet as part of an overall healthy lifestyle in mitigating the impact of genetics and other environmental insults. The purpose of this lesson is to provide patients with the opportunity to assess their personal nutrition priorities by looking at their family history, their own health history and current risk factors. Patients will also be able to discuss ways of prioritizing and modifying the [place] for their highest risk areas  Menu  Clinical staff led group instruction and group discussion with PowerPoint presentation and patient guidebook. To enhance the learning environment the use of posters, models and videos may be added. Using menus brought in from [employer], or printed from [employer], patients will apply the [place] dining out guidelines that were presented in the [employer] video. Patients will also be able to practice these guidelines in a variety of provided scenarios. The purpose of this lesson is to provide patients with the opportunity to practice  hands-on learning of the [place] with actual menus and practice scenarios.  Label Reading Clinical staff led group instruction and group discussion with PowerPoint presentation and patient guidebook. To enhance the learning environment the use of posters, models and videos may be added. Patients will review and discuss the Pritikin label reading guidelines presented in Pritikin's Label Reading Educational series video. Using fool labels brought in from local grocery stores and markets, patients will apply the label reading guidelines and determine if the packaged food meet the Pritikin guidelines. The purpose of this lesson is to provide patients with the opportunity to review, discuss, and practice hands-on learning of the Pritikin Label Reading guidelines with actual packaged food labels. [place] Workshops are designed to teach patients ways to prepare quick, simple, and affordable recipes at home. The importance of nutrition's role in chronic disease risk reduction is reflected in its emphasis in the overall Pritikin program. By learning how to prepare essential core Pritikin Eating Plan recipes, patients will increase control over what they eat; be able to customize the flavor of foods without the use of added salt, sugar, or fat; and improve the quality of the food they consume. By learning a set of core recipes which are easily assembled, quickly prepared, and affordable, patients are more likely to prepare more healthy foods at home. These workshops focus on convenient breakfasts, simple entres, side dishes, and desserts which can be prepared with minimal effort and are consistent with nutrition recommendations for cardiovascular risk reduction. Cooking [employer] are taught by a Engineer, materials (RD) who has been trained by the [employer]. The chef or RD has a clear understanding of the importance of minimizing -  if not completely eliminating - added fat, sugar, and sodium in recipes. Throughout the  series of Cooking School Workshop sessions, patients will learn about healthy ingredients and efficient methods of cooking to build confidence in their capability to prepare    Cooking School weekly topics:  Adding Flavor- Sodium-Free  Fast and Healthy Breakfasts  Powerhouse Plant-Based Proteins  Satisfying Salads and Dressings  Simple Sides and Sauces  International Cuisine-Spotlight on the [place] Zones  Delicious Desserts  Savory Soups  [employer] - Meals in a Snap  Tasty Appetizers and Snacks  Comforting Weekend Breakfasts  One-Pot Wonders   Fast Evening Meals  Easy [place] (Psychosocial): New Thoughts, New Behaviors Clinical staff led group instruction and group discussion with PowerPoint presentation and patient guidebook. To enhance the learning environment the use of posters, models and videos may be added. Patients will learn and practice techniques for developing effective health and lifestyle goals. Patients will be able to effectively apply the goal setting process learned to develop at least one new personal goal.  The purpose of this lesson is to expose patients to a new skill set of behavior modification techniques such as techniques setting SMART goals, overcoming barriers, and achieving new thoughts and new behaviors.  Managing Moods and Relationships Clinical staff led group instruction and group discussion with PowerPoint presentation and patient guidebook. To enhance the learning environment the use of posters, models and videos may be added. Patients will learn how emotional and chronic stress factors can impact their health and relationships. They will learn healthy ways to manage their moods and utilize positive coping mechanisms. In addition, ICR patients will learn ways to improve communication skills.  The purpose of this lesson is to expose patients to ways of understanding how one's mood and health are intimately connected. Developing a healthy outlook can help build positive relationships and connections with others. Patients will understand the importance of utilizing effective communication skills that include actively listening and being heard. They will learn and understand the importance of the "4 Cs" and especially Connections in fostering of a Healthy Mind-Set.  Healthy Sleep for a Healthy Heart Clinical staff led group instruction and group discussion with PowerPoint presentation and patient guidebook. To enhance the learning environment the use of posters, models and videos may be added. At the conclusion of this workshop, patients will be able to demonstrate knowledge of the importance of sleep to overall health, well-being, and quality of life. They will understand the symptoms of, and treatments for, common sleep disorders. Patients will also be able to identify daytime and nighttime behaviors which impact sleep, and they will be able to apply these tools to help manage sleep-related challenges. The purpose of this lesson is to provide patients with a general overview of sleep and outline the importance of quality sleep. Patients will learn about a few of the most common sleep disorders. Patients will also be introduced to the concept of "sleep hygiene," and discover ways to self-manage certain sleeping problems through simple daily behavior changes. Finally, the workshop will motivate patients by clarifying the links between quality sleep and their goals of heart-healthy living.   Recognizing and Reducing Stress Clinical staff led group instruction and group discussion with PowerPoint presentation and patient guidebook. To enhance the learning environment the use of posters, models and videos may be added. At the conclusion of this workshop, patients will be able to understand the types of  stress reactions, differentiate between acute and chronic stress, and recognize the impact that chronic stress has  on their health. They will also be able to apply different coping mechanisms, such as reframing negative self-talk. Patients will have the opportunity to practice a variety of stress management techniques, such as deep abdominal breathing, progressive muscle relaxation, and/or guided imagery.  The purpose of this lesson is to educate patients on the role of stress in their lives and to provide healthy techniques for coping with it.  Learning Barriers/Preferences:  Learning Barriers/Preferences - 06/02/22 1547       Learning Barriers/Preferences   Learning Barriers Inability to learn new things;Exercise Concerns;Hearing   Cailen reports feeling lightheaded and cant remeber as he used to   [employer];Individual Instruction             Education Topics:  Knowledge Questionnaire Score:  Knowledge Questionnaire Score - 06/08/22 1432       Knowledge Questionnaire Score   Pre Score 19/24             Core Components/Risk Factors/Patient Goals at Admission:  Personal Goals and Risk Factors at Admission - 06/02/22 1629       Core Components/Risk Factors/Patient Goals on Admission    Weight Management Yes;Obesity;Weight Loss    Intervention Weight Management/Obesity: Establish reasonable short term and long term weight goals.;Obesity: Provide education and appropriate resources to help participant work on and attain dietary goals.    Admit Weight 265 lb 10.5 oz (120.5 kg)    Goal Weight: Short Term 260 lb (117.9 kg)    Goal Weight: Long Term 245 lb (111.1 kg)    Expected Outcomes Short Term: Continue to assess and modify interventions until short term weight is achieved;Long Term: Adherence to nutrition and physical activity/exercise program aimed toward attainment of established weight goal;Weight Loss: Understanding of general  recommendations for a balanced deficit meal plan, which promotes 1-2 lb weight loss per week and includes a negative energy balance of [phone] kcal/d    Diabetes Yes    Intervention Provide education about signs/symptoms and action to take for hypo/hyperglycemia.;Provide education about proper nutrition, including hydration, and aerobic/resistive exercise prescription along with prescribed medications to achieve blood glucose in normal ranges: Fasting glucose 65-99 mg/dL    Expected Outcomes Short Term: Participant verbalizes understanding of the signs/symptoms and immediate care of hyper/hypoglycemia, proper foot care and importance of medication, aerobic/resistive exercise and nutrition plan for blood glucose control.;Long Term: Attainment of HbA1C < 7%.    Heart Failure Yes    Intervention Provide a combined exercise and nutrition program that is supplemented with education, support and counseling about heart failure. Directed toward relieving symptoms such as shortness of breath, decreased exercise tolerance, and extremity edema.    Expected Outcomes Improve functional capacity of life;Short term: Attendance in program 2-3 days a week with increased exercise capacity. Reported lower sodium intake. Reported increased fruit and vegetable intake. Reports medication compliance.;Short term: Daily weights obtained and reported for increase. Utilizing diuretic protocols set by physician.;Long term: Adoption of self-care skills and reduction of barriers for early signs and symptoms recognition and intervention leading to self-care maintenance.    Hypertension Yes    Intervention Provide education on lifestyle modifcations including regular physical activity/exercise, weight management, moderate sodium restriction and increased consumption of fresh fruit, vegetables, and low fat dairy, alcohol moderation, and smoking cessation.;Monitor prescription use compliance.    Expected Outcomes Short Term: Continued  assessment and intervention until BP is < 140/50mm HG in hypertensive participants. < 130/58mm HG in hypertensive participants with diabetes, heart failure or chronic  kidney disease.;Long Term: Maintenance of blood pressure at goal levels.    Lipids Yes    Intervention Provide education and support for participant on nutrition & aerobic/resistive exercise along with prescribed medications to achieve LDL '70mg'$ , HDL >$Remo'40mg'sWoVS$ .    Expected Outcomes Short Term: Participant states understanding of desired cholesterol values and is compliant with medications prescribed. Participant is following exercise prescription and nutrition guidelines.;Long Term: Cholesterol controlled with medications as prescribed, with individualized exercise RX and with personalized nutrition plan. Value goals: LDL < $Rem'70mg'fmvc$ , HDL > 40 mg.    Stress Yes    Intervention Offer individual and/or small group education and counseling on adjustment to heart disease, stress management and health-related lifestyle change. Teach and support self-help strategies.;Refer participants experiencing significant psychosocial distress to appropriate mental health specialists for further evaluation and treatment. When possible, include family members and significant others in education/counseling sessions.    Expected Outcomes Short Term: Participant demonstrates changes in health-related behavior, relaxation and other stress management skills, ability to obtain effective social support, and compliance with psychotropic medications if prescribed.;Long Term: Emotional wellbeing is indicated by absence of clinically significant psychosocial distress or social isolation.             Core Components/Risk Factors/Patient Goals Review:   Goals and Risk Factor Review     Row Name 06/10/22 0929 06/18/22 1447           Core Components/Risk Factors/Patient Goals Review   Personal Goals Review Weight Management/Obesity;Hypertension;Lipids;Diabetes;Stress  Weight Management/Obesity;Hypertension;Lipids;Diabetes;Stress      Review Justis started cardiac rehab on 06/08/22. Roby did well with exercise for his fitness level. CBG's and vital signs were stable. Adom's hydralazine was decreased by Dr Tamala Julian will continue to monitor BP Willy started cardiac rehab on 06/08/22. Jerod is off to a good start to exercise. CBG's and vital signs have been stable. Amaurie feels and looks better since his medications have been adjusted      Expected Outcomes Montrez will continue to participate in phase 2 cardiac rehab for exercise, nutrition and lifestyle modifications Daquavion will continue to participate in intensive  rehab for exercise, nutrition and lifestyle modifications               Core Components/Risk Factors/Patient Goals at Discharge (Final Review):   Goals and Risk Factor Review - 06/18/22 1447       Core Components/Risk Factors/Patient Goals Review   Personal Goals Review Weight Management/Obesity;Hypertension;Lipids;Diabetes;Stress    Review Ashwin started cardiac rehab on 06/08/22. Hill is off to a good start to exercise. CBG's and vital signs have been stable. Emanual feels and looks better since his medications have been adjusted    Expected Outcomes Dayveon will continue to participate in intensive  rehab for exercise, nutrition and lifestyle modifications             ITP Comments:  ITP Comments     Row Name 06/02/22 1304 06/02/22 1434 06/10/22 0924 06/18/22 1447     ITP Comments Medical Director- Dr. Fransico Him, MD Dr Fransico Him MD, Medical Director. Introduction to [employer] program provided at [employer]. Initial resouce packet reviewed with the patient and his fiance. Patient took  Pritik[employer] packet home to review. 30 Day ITP Review. Greogry started cardiac rehab on 06/08/21. Nuel did well with exercise for his fitness level 30 Day ITP Review. Alexanderjames is off to a good start to exercise.             Comments: See ITP comments.Christa See  Anwen Cannedy Hydrographic surveyor

## 2022-06-18 NOTE — Progress Notes (Deleted)
Cardiac Individual Treatment Plan  Patient Details  Name: Ross [name] MRN: [id number] Date of Birth: 06/28/1956 Referring Provider:   Flowsheet Row INTENSIVE CARDIAC REHAB ORIENT from 06/02/2022 in [facility]  Referring Provider Belva Crome, MD.       Initial Encounter Date:  [place] from 06/02/2022 in [facility]  Date 06/02/22       Visit Diagnosis: Chronic stable angina ([place])  Patient's Home Medications on Admission:  Current Outpatient Medications:    ALPRAZolam (XANAX) 0.5 MG tablet, Take 1-2 tablets (0.5-1 mg total) by mouth at bedtime. (Patient taking differently: Take 1 mg by mouth at bedtime.), Disp: 62 tablet, Rfl: 1   amiodarone (PACERONE) 200 MG tablet, Take 1 tablet (200 mg total) by mouth daily., Disp: 90 tablet, Rfl: 3   apixaban (ELIQUIS) 5 MG TABS tablet, Take 1 tablet (5 mg total) by mouth 2 (two) times daily., Disp: 60 tablet, Rfl: 5   aspirin 81 MG chewable tablet, Chew 1 tablet (81 mg total) by mouth daily., Disp: , Rfl:    atorvastatin (LIPITOR) 80 MG tablet, Take 1 tablet (80 mg total) by mouth daily., Disp: 90 tablet, Rfl: 3   budesonide-formoterol (SYMBICORT) 160-4.5 MCG/ACT inhaler, Inhale 2 puffs into the lungs 2 (two) times daily as needed (shortness of breath)., Disp: , Rfl:    carvedilol (COREG) 6.25 MG tablet, Take 1 tablet (6.25 mg total) by mouth 2 (two) times daily with a meal., Disp: 180 tablet, Rfl: 3   dapagliflozin propanediol (FARXIGA) 10 MG TABS tablet, Take 1 tablet (10 mg total) by mouth daily before breakfast. (Patient taking differently: Take 10 mg by mouth daily in the afternoon.), Disp: 90 tablet, Rfl: 3   gabapentin (NEURONTIN) 300 MG capsule, Take 1 capsule (300 mg total) by mouth daily., Disp: 30 capsule, Rfl: 3   gabapentin (NEURONTIN) 300 MG capsule, Take 1 capsule (300 mg total) by mouth 2 (two) times daily., Disp: 60 capsule, Rfl:  5   hydrALAZINE (APRESOLINE) 25 MG tablet, Take 1 tablet (25 mg total) by mouth 3 (three) times daily., Disp: 180 tablet, Rfl: 1   isosorbide dinitrate (ISORDIL) 20 MG tablet, Take 1 tablet (20 mg total) by mouth 3 (three) times daily., Disp: 270 tablet, Rfl: 2   linaclotide (LINZESS) 145 MCG CAPS capsule, Take 145 mcg by mouth daily as needed (constipation)., Disp: , Rfl:    pantoprazole (PROTONIX) 40 MG tablet, Take 1 tablet (40 mg total) by mouth daily., Disp: 30 tablet, Rfl: 3   polyethylene glycol powder (GLYCOLAX/MIRALAX) 17 GM/SCOOP powder, Take 17 g by mouth 2 (two) times daily. (Patient taking differently: Take 17 g by mouth daily as needed (constipation.).), Disp: 510 g, Rfl: 0   senna (SENOKOT) 8.6 MG TABS tablet, Take 2 tablets by mouth at bedtime., Disp: , Rfl:    sodium chloride (OCEAN) 0.65 % SOLN nasal spray, Place 1 spray into both nostrils as needed for congestion. (Patient taking differently: Place 1 spray into both nostrils 2 (two) times daily as needed for congestion.), Disp: , Rfl: 0   Tetrahydrozoline HCl (EYE DROPS REGULAR OP), Place 1 drop into both eyes daily as needed (irritation)., Disp: , Rfl:    vitamin C (ASCORBIC ACID) 500 MG tablet, Take 500 mg by mouth daily., Disp: , Rfl:    white petrolatum (VASELINE) GEL, Apply 1 application  topically as needed (wound care)., Disp: , Rfl:   Past Medical History:  Past Medical History:  Diagnosis Date   Anxiety    Arthritis    "all over my body"   Atrial fibrillation ([place])    rate control strategy; LA large   CAD (coronary artery disease) of artery bypass graft    Cardiac arrest ([place]) 05/31/2021   Carpal tunnel syndrome    Cerebral aneurysm dx'd 03/29/7352   Complication of anesthesia    "I' was told that I'm a very high risk to be put to sleep; I may not come out of it" (01/16/2016)   COPD (chronic obstructive pulmonary disease) ([place])    Coronary artery disease    a. s/p CABG;  b. Myoview (7/13):  apical anterior  ischemia; c. LHC (08/2012): dLM 60%, mid LAD 90%, pCFX 40%, OM1 occluded, RCA 80-90%, distal RCA 60-70%, SVG-Dx patent, SVG-OM1 patent, LIMA-LAD patent, SVG-PDA occluded, EF 55%. => RCA dsz too long and would req multiple stents; pt refused CABG => med Rx.   Depression    DJD (degenerative joint disease)    Dyslipidemia    Dysrhythmia    Hx of echocardiogram    a. Echocardiogram (05/2013): Mild LVH, EF 54.8%, moderate LAE, mild aortic root dilatation, trace pulmonic regurgitation   Hyperlipidemia    Hypertension    Ischemic cardiomyopathy    Obesity    Peripheral neuropathy    Permanent atrial fibrillation ([facility])    Presence of permanent cardiac pacemaker    Type II diabetes mellitus ([place])    "haven't taken RX for 7-8 years" (01/16/2016)   Ventricular fibrillation ([place]) 05/31/2021    Tobacco Use: Social History   Tobacco Use  Smoking Status Former   Packs/day: 1.50   Years: 45.00   Total pack years: 67.50   Types: Cigarettes   Quit date: 10/2020   Years since quitting: 1.6  Smokeless Tobacco Never    Labs: Review Flowsheet       Latest Ref Rng & Units 10/24/2015 05/31/2021 06/01/2021  Labs for ITP Cardiac and Pulmonary Rehab  Cholestrol 125 - 200 mg/dL 96  - -  LDL (calc) <130 mg/dL 33  - -  HDL-C >=40 mg/dL 39  - -  Trlycerides <150 mg/dL 120  - -  Hemoglobin A1c 4.8 - 5.6 % - - 5.6   PH, Arterial 7.350 - 7.450 - 7.275  7.216  7.172  7.088  7.356   PCO2 arterial 32.0 - 48.0 mmHg - 38.2  51.7  48.5  43.5  29.9   Bicarbonate 20.0 - 28.0 mmol/L - 17.6  21.0  17.8  13.5  16.7   TCO2 22 - 32 mmol/L - _0 Acid-base deficit 0.0 - 2.0 mmol/L - 8.0  7.0  11.0  16.0  7.0   O2 Saturation % - 99.0  100.0  100.0  100.0  97.0     Capillary Blood Glucose: Lab Results  Component Value Date   GLUCAP 96 06/12/2022   GLUCAP 95 06/10/2022   GLUCAP 82 06/10/2022   GLUCAP 91 06/08/2022   GLUCAP 109 (H) 06/08/2022     Exercise Target Goals: Exercise Program  Goal: Individual exercise prescription set using results from initial 6 min walk test and THRR while considering  patient's activity barriers and safety.   Exercise Prescription Goal: Starting with aerobic activity 30 plus minutes a day, 3 days per week for initial exercise prescription. Provide home exercise prescription and guidelines that participant acknowledges understanding prior to  discharge.  Activity Barriers & Risk Stratification:  Activity Barriers & Cardiac Risk Stratification - 06/02/22 1317       Activity Barriers & Cardiac Risk Stratification   Activity Barriers Joint Problems;Arthritis;Other (comment);Balance Concerns    Comments Generalized joint pain, bilateral hip pain, peripheral neuropathy, dizziness.    Cardiac Risk Stratification High             6 Minute Walk:  6 Minute Walk     Row Name 06/02/22 1620         6 Minute Walk   Phase Initial     Distance 0.39 feet  miles     Walk Time 6 minutes     # of Rest Breaks 3     MPH 3.85     METS 3.2     RPE 14     Perceived Dyspnea  2.5     Symptoms Yes (comment)     Comments Bilateral hip pain, 8/10, generalized, overall pain, mild to moderate SOB, RPD=2.5     Resting HR 85 bpm     Resting BP 98/64     Resting Oxygen Saturation  95 %     Exercise Oxygen Saturation  during 6 min walk 99 %     Max Ex. HR 87 bpm     Max Ex. BP 108/50     2 Minute Post BP 118/62              Oxygen Initial Assessment:   Oxygen Re-Evaluation:   Oxygen Discharge (Final Oxygen Re-Evaluation):   Initial Exercise Prescription:  Initial Exercise Prescription - 06/02/22 1600       Date of Initial Exercise RX and Referring Provider   Date 06/02/22    Referring Provider Belva Crome, MD.    Expected Discharge Date 07/31/22      T5 Nustep   Level 1    SPM 85    Minutes 25    METs 2.2      Prescription Details   Frequency (times per week) 3    Duration Progress to 30 minutes of continuous aerobic  without signs/symptoms of physical distress      Intensity   THRR 40-80% of Max Heartrate 61-122    Ratings of Perceived Exertion 11-13    Perceived Dyspnea 0-4      Progression   Progression Continue to progress workloads to maintain intensity without signs/symptoms of physical distress.      Resistance Training   Training Prescription Yes    Weight 2 lbs    Reps 10-15             Perform Capillary Blood Glucose checks as needed.  Exercise Prescription Changes:   Exercise Prescription Changes     Row Name 06/08/22 1632             Response to Exercise   Blood Pressure (Admit) 118/72       Blood Pressure (Exercise) 122/60       Blood Pressure (Exit) 118/64       Heart Rate (Admit) 80 bpm       Heart Rate (Exercise) 103 bpm       Heart Rate (Exit) 61 bpm       Rating of Perceived Exertion (Exercise) 15       Perceived Dyspnea (Exercise) 0       Symptoms 0       Comments Pt first day in the [facility] program  Duration Progress to 30 minutes of  aerobic without signs/symptoms of physical distress       Intensity THRR unchanged         Progression   Progression Continue to progress workloads to maintain intensity without signs/symptoms of physical distress.       Average METs 2         Resistance Training   Training Prescription Yes       Weight 3 lbs       Reps 10-15       Time 10 Minutes         T5 Nustep   Level 1       SPM 85       Minutes 25       METs 2                Exercise Comments:   Exercise Comments     Row Name 06/08/22 1637           Exercise Comments pt first day in the [place] program. Pt is tolerating low intensity exercise well with some balance concerns and an average MET level of 2.0. Pt is learning his RPE, THRR and ExRx                Exercise Goals and Review:   Exercise Goals     Row Name 06/02/22 1317             Exercise Goals   Increase Physical Activity Yes       Intervention Provide advice,  education, support and counseling about physical activity/exercise needs.;Develop an individualized exercise prescription for aerobic and resistive training based on initial evaluation findings, risk stratification, comorbidities and participant's personal goals.       Expected Outcomes Short Term: Attend rehab on a regular basis to increase amount of physical activity.;Long Term: Exercising regularly at least 3-5 days a week.;Long Term: Add in home exercise to make exercise part of routine and to increase amount of physical activity.       Increase Strength and Stamina Yes       Intervention Provide advice, education, support and counseling about physical activity/exercise needs.;Develop an individualized exercise prescription for aerobic and resistive training based on initial evaluation findings, risk stratification, comorbidities and participant's personal goals.       Expected Outcomes Short Term: Increase workloads from initial exercise prescription for resistance, speed, and METs.;Short Term: Perform resistance training exercises routinely during rehab and add in resistance training at home;Long Term: Improve cardiorespiratory fitness, muscular endurance and strength as measured by increased METs and functional capacity (6MWT)       Able to understand and use rate of perceived exertion (RPE) scale Yes       Intervention Provide education and explanation on how to use RPE scale       Expected Outcomes Short Term: Able to use RPE daily in rehab to express subjective intensity level;Long Term:  Able to use RPE to guide intensity level when exercising independently       Knowledge and understanding of Target Heart Rate Range (THRR) Yes       Intervention Provide education and explanation of THRR including how the numbers were predicted and where they are located for reference       Expected Outcomes Short Term: Able to state/look up THRR;Long Term: Able to use THRR to govern intensity when exercising  independently;Short Term: Able to use daily as guideline for intensity in rehab  Understanding of Exercise Prescription Yes       Intervention Provide education, explanation, and written materials on patient's individual exercise prescription       Expected Outcomes Short Term: Able to explain program exercise prescription;Long Term: Able to explain home exercise prescription to exercise independently                Exercise Goals Re-Evaluation :  Exercise Goals Re-Evaluation     [place] Name 06/08/22 1635             Exercise Goal Re-Evaluation   Exercise Goals Review Increase Physical Activity;Increase Strength and Stamina;Able to understand and use rate of perceived exertion (RPE) scale;Knowledge and understanding of Target Heart Rate Range (THRR);Understanding of Exercise Prescription       Comments pt first day in the [facility] program. Pt is tolerating low intensity exercise well with some balance concerns and an average MET level of 2.0. Pt is learning his RPE, THRR and ExRx       Expected Outcomes Will continue to monitor pt and progress workloads as tolerated without sign or symptom                 Discharge Exercise Prescription (Final Exercise Prescription Changes):  Exercise Prescription Changes - 06/08/22 1632       Response to Exercise   Blood Pressure (Admit) 118/72    Blood Pressure (Exercise) 122/60    Blood Pressure (Exit) 118/64    Heart Rate (Admit) 80 bpm    Heart Rate (Exercise) 103 bpm    Heart Rate (Exit) 61 bpm    Rating of Perceived Exertion (Exercise) 15    Perceived Dyspnea (Exercise) 0    Symptoms 0    Comments Pt first day in the CRP2 program    Duration Progress to 30 minutes of  aerobic without signs/symptoms of physical distress    Intensity THRR unchanged      Progression   Progression Continue to progress workloads to maintain intensity without signs/symptoms of physical distress.    Average METs 2      Resistance Training    Training Prescription Yes    Weight 3 lbs    Reps 10-15    Time 10 Minutes      T5 Nustep   Level 1    SPM 85    Minutes 25    METs 2             Nutrition:  Target Goals: Understanding of nutrition guidelines, daily intake of sodium <1558m, cholesterol <2074m calories 30% from fat and 7% or less from saturated fats, daily to have 5 or more servings of fruits and vegetables.  Biometrics:  Pre Biometrics - 06/02/22 1428       Pre Biometrics   Waist Circumference 49 inches    Hip Circumference 49.75 inches    Waist to Hip Ratio 0.98 %    Triceps Skinfold 26 mm    % Body Fat 36.6 %    Grip Strength 32 kg    Flexibility --   Not performed, c/o bilateral hip pain.             Nutrition Therapy Plan and Nutrition Goals:  Nutrition Therapy & Goals - 06/08/22 1608       Nutrition Therapy   Diet Heart Healthy Diet    Drug/Food Interactions Statins/Certain Fruits      Personal Nutrition Goals   Nutrition Goal Patient to choose a variety of fruits, vegetables, whole grains,  lean protein/plant protein, and nonfat dairy daily as part of a heart healthy diet    Personal Goal #2 Patient to limit to <151m sodium per day    Personal Goal #3 Patient to identify and limit daily food sources of sodium, trans fats, saturated fat, and refined carbohydrates      Intervention Plan   Intervention Prescribe, educate and counsel regarding individualized specific dietary modifications aiming towards targeted core components such as weight, hypertension, lipid management, diabetes, heart failure and other comorbidities.;Nutrition handout(s) given to patient.    Expected Outcomes Short Term Goal: Understand basic principles of dietary content, such as calories, fat, sodium, cholesterol and nutrients.;Short Term Goal: A plan has been developed with personal nutrition goals set during dietitian appointment.;Long Term Goal: Adherence to prescribed nutrition plan.              Nutrition Assessments:  Nutrition Assessments - 06/12/22 1525       Rate Your Plate Scores   Pre Score 57            MEDIFICTS Score Key: ?70 Need to make dietary changes  40-70 Heart Healthy Diet ? 40 Therapeutic Level Cholesterol Diet  Flowsheet Row INTENSIVE CARDIAC REHAB from 06/12/2022 in [facility] Picture Your Plate Total Score on Admission 57      Picture Your Plate Scores: <<93Unhealthy dietary pattern with much room for improvement. 41-50 Dietary pattern unlikely to meet recommendations for good health and room for improvement. 51-60 More healthful dietary pattern, with some room for improvement.  >60 Healthy dietary pattern, although there may be some specific behaviors that could be improved.    Nutrition Goals Re-Evaluation:   Nutrition Goals Discharge (Final Nutrition Goals Re-Evaluation):   Psychosocial: Target Goals: Acknowledge presence or absence of significant depression and/or stress, maximize coping skills, provide positive support system. Participant is able to verbalize types and ability to use techniques and skills needed for reducing stress and depression.  Initial Review & Psychosocial Screening:  Initial Psych Review & Screening - 06/02/22 1435       Initial Review   Current issues with Current Stress Concerns;History of Depression;Current Sleep Concerns   GHarjittakes Xanax for sleep.   Source of Stress Concerns Unable to perform yard/household activities;Unable to participate in former interests or hobbies    Comments [place] concerned about his health      [place] Yes   [place] his fiance and his daughter for support     Barriers   Psychosocial barriers to participate in program The patient should benefit from training in stress management and relaxation.      Screening Interventions   Interventions Encouraged to exercise;To provide support and resources with  identified psychosocial needs;Provide feedback about the scores to participant    Expected Outcomes Long Term Goal: Stressors or current issues are controlled or eliminated.;Short Term goal: Identification and review with participant of any Quality of Life or Depression concerns found by scoring the questionnaire.;Long Term goal: The participant improves quality of Life and PHQ9 Scores as seen by post scores and/or verbalization of changes             Quality of Life Scores:  Quality of Life - 06/02/22 1632       Quality of Life   Select Quality of Life      Quality of Life Scores   Health/Function Pre 13.71 %    Socioeconomic Pre 22.5 %  Psych/Spiritual Pre 24 %    Family Pre 30 %    GLOBAL Pre 20.29 %            Scores of 19 and below usually indicate a poorer quality of life in these areas.  A difference of  2-3 points is a clinically meaningful difference.  A difference of 2-3 points in the total score of the Quality of Life Index has been associated with significant improvement in overall quality of life, self-image, physical symptoms, and general health in studies assessing change in quality of life.  PHQ-9: Review Flowsheet       06/02/2022  Depression screen PHQ 2/9  Decreased Interest 0  Down, Depressed, Hopeless 0  PHQ - 2 Score 0   Interpretation of Total Score  Total Score Depression Severity:  1-4 = Minimal depression, 5-9 = Mild depression, 10-14 = Moderate depression, 15-19 = Moderately severe depression, 20-27 = Severe depression   Psychosocial Evaluation and Intervention:   Psychosocial Re-Evaluation:  Psychosocial Re-Evaluation     [place] Name 06/10/22 0926             Psychosocial Re-Evaluation   Current issues with Current Stress Concerns;Current Sleep Concerns;History of Depression       Comments Reviewed Juana's quality of life questionnire he is dissatisfied with his health due to recent events       Expected Outcomes Aiden will have  decreased stress, better coping mechanisms upon completion of phase 2 cardiac rehab       Interventions Stress management education;Relaxation education;Encouraged to attend Cardiac Rehabilitation for the exercise         Initial Review   Source of Stress Concerns Chronic Illness;Poor Coping Skills;Unable to perform yard/household activities;Unable to participate in former interests or hobbies       Comments Will continue to monitor and offer support as needed                Psychosocial Discharge (Final Psychosocial Re-Evaluation):  Psychosocial Re-Evaluation - 06/10/22 0926       Psychosocial Re-Evaluation   Current issues with Current Stress Concerns;Current Sleep Concerns;History of Depression    Comments Reviewed Yisrael's quality of life questionnire he is dissatisfied with his health due to recent events    Expected Outcomes Demond will have decreased stress, better coping mechanisms upon completion of phase 2 cardiac rehab    Interventions Stress management education;Relaxation education;Encouraged to attend Cardiac Rehabilitation for the exercise      Initial Review   Source of Stress Concerns Chronic Illness;Poor Coping Skills;Unable to perform yard/household activities;Unable to participate in former interests or hobbies    Comments Will continue to monitor and offer support as needed             Vocational Rehabilitation: Provide vocational rehab assistance to qualifying candidates.   Vocational Rehab Evaluation & Intervention:  Vocational Rehab - 06/02/22 1436       Initial Vocational Rehab Evaluation & Intervention   Assessment shows need for Vocational Rehabilitation No   Courtez is retired and does not need vocational rehab at this time            Education: Education Goals: Education classes will be provided on a weekly basis, covering required topics. Participant will state understanding/return demonstration of topics presented.  Learning  Barriers/Preferences:  Learning Barriers/Preferences - 06/02/22 1547       Learning Barriers/Preferences   Learning Barriers Inability to learn new things;Exercise Concerns;Hearing   Fabrizio reports feeling lightheaded and  cant remeber as he used to   Holiday representative;Individual Instruction             Education Topics: Hypertension, Hypertension Reduction -Define heart disease and high blood pressure. Discus how high blood pressure affects the body and ways to reduce high blood pressure.   Exercise and Your Heart -Discuss why it is important to exercise, the FITT principles of exercise, normal and abnormal responses to exercise, and how to exercise safely.   Angina -Discuss definition of angina, causes of angina, treatment of angina, and how to decrease risk of having angina.   Cardiac Medications -Review what the following cardiac medications are used for, how they affect the body, and side effects that may occur when taking the medications.  Medications include Aspirin, Beta blockers, calcium channel blockers, ACE Inhibitors, angiotensin receptor blockers, diuretics, digoxin, and antihyperlipidemics.   Congestive Heart Failure -Discuss the definition of CHF, how to live with CHF, the signs and symptoms of CHF, and how keep track of weight and sodium intake.   Heart Disease and Intimacy -Discus the effect sexual activity has on the heart, how changes occur during intimacy as we age, and safety during sexual activity.   Smoking Cessation / COPD -Discuss different methods to quit smoking, the health benefits of quitting smoking, and the definition of COPD.   Nutrition I: Fats -Discuss the types of cholesterol, what cholesterol does to the heart, and how cholesterol levels can be controlled.   Nutrition II: Labels -Discuss the different components of food labels and how to read food label   Heart Parts/Heart Disease and  PAD -Discuss the anatomy of the heart, the pathway of blood circulation through the heart, and these are affected by heart disease.   Stress I: Signs and Symptoms -Discuss the causes of stress, how stress may lead to anxiety and depression, and ways to limit stress.   Stress II: Relaxation -Discuss different types of relaxation techniques to limit stress.   Warning Signs of Stroke / TIA -Discuss definition of a stroke, what the signs and symptoms are of a stroke, and how to identify when someone is having stroke.   Knowledge Questionnaire Score:  Knowledge Questionnaire Score - 06/08/22 1432       Knowledge Questionnaire Score   Pre Score 19/24             Core Components/Risk Factors/Patient Goals at Admission:  Personal Goals and Risk Factors at Admission - 06/02/22 1629       Core Components/Risk Factors/Patient Goals on Admission    Weight Management Yes;Obesity;Weight Loss    Intervention Weight Management/Obesity: Establish reasonable short term and long term weight goals.;Obesity: Provide education and appropriate resources to help participant work on and attain dietary goals.    Admit Weight 265 lb 10.5 oz (120.5 kg)    Goal Weight: Short Term 260 lb (117.9 kg)    Goal Weight: Long Term 245 lb (111.1 kg)    Expected Outcomes Short Term: Continue to assess and modify interventions until short term weight is achieved;Long Term: Adherence to nutrition and physical activity/exercise program aimed toward attainment of established weight goal;Weight Loss: Understanding of general recommendations for a balanced deficit meal plan, which promotes 1-2 lb weight loss per week and includes a negative energy balance of [phone] kcal/d    Diabetes Yes    Intervention Provide education about signs/symptoms and action to take for hypo/hyperglycemia.;Provide education about proper nutrition, including hydration, and aerobic/resistive exercise prescription along with prescribed  medications to achieve blood glucose in normal ranges: Fasting glucose 65-99 mg/dL    Expected Outcomes Short Term: Participant verbalizes understanding of the signs/symptoms and immediate care of hyper/hypoglycemia, proper foot care and importance of medication, aerobic/resistive exercise and nutrition plan for blood glucose control.;Long Term: Attainment of HbA1C < 7%.    Heart Failure Yes    Intervention Provide a combined exercise and nutrition program that is supplemented with education, support and counseling about heart failure. Directed toward relieving symptoms such as shortness of breath, decreased exercise tolerance, and extremity edema.    Expected Outcomes Improve functional capacity of life;Short term: Attendance in program 2-3 days a week with increased exercise capacity. Reported lower sodium intake. Reported increased fruit and vegetable intake. Reports medication compliance.;Short term: Daily weights obtained and reported for increase. Utilizing diuretic protocols set by physician.;Long term: Adoption of self-care skills and reduction of barriers for early signs and symptoms recognition and intervention leading to self-care maintenance.    Hypertension Yes    Intervention Provide education on lifestyle modifcations including regular physical activity/exercise, weight management, moderate sodium restriction and increased consumption of fresh fruit, vegetables, and low fat dairy, alcohol moderation, and smoking cessation.;Monitor prescription use compliance.    Expected Outcomes Short Term: Continued assessment and intervention until BP is < 140/68m HG in hypertensive participants. < 130/824mHG in hypertensive participants with diabetes, heart failure or chronic kidney disease.;Long Term: Maintenance of blood pressure at goal levels.    Lipids Yes    Intervention Provide education and support for participant on nutrition & aerobic/resistive exercise along with prescribed medications to  achieve LDL <7053mHDL >50m59m  Expected Outcomes Short Term: Participant states understanding of desired cholesterol values and is compliant with medications prescribed. Participant is following exercise prescription and nutrition guidelines.;Long Term: Cholesterol controlled with medications as prescribed, with individualized exercise RX and with personalized nutrition plan. Value goals: LDL < 70mg70mL > 40 mg.    Stress Yes    Intervention Offer individual and/or small group education and counseling on adjustment to heart disease, stress management and health-related lifestyle change. Teach and support self-help strategies.;Refer participants experiencing significant psychosocial distress to appropriate mental health specialists for further evaluation and treatment. When possible, include family members and significant others in education/counseling sessions.    Expected Outcomes Short Term: Participant demonstrates changes in health-related behavior, relaxation and other stress management skills, ability to obtain effective social support, and compliance with psychotropic medications if prescribed.;Long Term: Emotional wellbeing is indicated by absence of clinically significant psychosocial distress or social isolation.             Core Components/Risk Factors/Patient Goals Review:   Goals and Risk Factor Review     Row Name 06/10/22 0929 06/18/22 1447           Core Components/Risk Factors/Patient Goals Review   Personal Goals Review Weight Management/Obesity;Hypertension;Lipids;Diabetes;Stress Weight Management/Obesity;Hypertension;Lipids;Diabetes;Stress      Review Keigan Josehted cardiac rehab on 06/08/22. Casson Jerimeywell with exercise for his fitness level. CBG's and vital signs were stable. Suhan's hydralazine was decreased by Dr SmithTamala Julian continue to monitor BP Joshu Ayazted cardiac rehab on 06/08/22. Josiyah Daevionff to a good start to exercise. CBG's and vital signs have been stable. Sriyan Khadimls and looks better since his medications have been adjusted      Expected Outcomes Allan Cleland continue to participate in phase 2 cardiac rehab for exercise, nutrition and lifestyle modifications Ken Jawuan continue to participate in intensive  rehab for exercise, nutrition and lifestyle modifications               Core Components/Risk Factors/Patient Goals at Discharge (Final Review):   Goals and Risk Factor Review - 06/18/22 1447       Core Components/Risk Factors/Patient Goals Review   Personal Goals Review Weight Management/Obesity;Hypertension;Lipids;Diabetes;Stress    Review Sanel started cardiac rehab on 06/08/22. Andron is off to a good start to exercise. CBG's and vital signs have been stable. Kadarrius feels and looks better since his medications have been adjusted    Expected Outcomes Ronelle will continue to participate in intensive  rehab for exercise, nutrition and lifestyle modifications             ITP Comments:  ITP Comments     Row Name 06/02/22 1304 06/02/22 1434 06/10/22 0924 06/18/22 1447     ITP Comments Medical Director- Dr. Fransico Him, MD Dr Fransico Him MD, Medical Director. Introduction to [employer] program provided at [employer]. Initial resouce packet reviewed with the patient and his fiance. Patient took  Pritikin packet home to review. 30 Day ITP Review. Owen started cardiac rehab on 06/08/21. Brittan did well with exercise for his fitness level 30 Day ITP Review. Caulder is off to a good start to exercise.             Comments: See ITP comments.Harrell Gave RN BSN

## 2022-06-19 ENCOUNTER — Encounter (HOSPITAL_COMMUNITY)
Admission: RE | Admit: 2022-06-19 | Discharge: 2022-06-19 | Disposition: A | Discharge status: Home / Self Care | Payer: Medicare Other | Source: Ambulatory Visit | Attending: Interventional Cardiology | Admitting: Interventional Cardiology

## 2022-06-19 DIAGNOSIS — I208 Other forms of angina pectoris: Secondary | ICD-10-CM | POA: Diagnosis not present

## 2022-06-19 DIAGNOSIS — R7303 Prediabetes: Secondary | ICD-10-CM | POA: Diagnosis not present

## 2022-06-19 DIAGNOSIS — Z955 Presence of coronary angioplasty implant and graft: Secondary | ICD-10-CM | POA: Diagnosis not present

## 2022-06-19 DIAGNOSIS — I5022 Chronic systolic (congestive) heart failure: Secondary | ICD-10-CM | POA: Diagnosis not present

## 2022-06-22 ENCOUNTER — Telehealth: Payer: Self-pay | Admitting: Cardiology

## 2022-06-22 ENCOUNTER — Encounter (HOSPITAL_COMMUNITY)
Admission: RE | Admit: 2022-06-22 | Discharge: 2022-06-22 | Disposition: A | Discharge status: Home / Self Care | Payer: Medicare Other | Source: Ambulatory Visit | Attending: Interventional Cardiology | Admitting: Interventional Cardiology

## 2022-06-22 DIAGNOSIS — I208 Other forms of angina pectoris: Secondary | ICD-10-CM | POA: Diagnosis not present

## 2022-06-22 DIAGNOSIS — Z48812 Encounter for surgical aftercare following surgery on the circulatory system: Secondary | ICD-10-CM | POA: Insufficient documentation

## 2022-06-22 DIAGNOSIS — Z955 Presence of coronary angioplasty implant and graft: Secondary | ICD-10-CM | POA: Insufficient documentation

## 2022-06-22 DIAGNOSIS — I5022 Chronic systolic (congestive) heart failure: Secondary | ICD-10-CM | POA: Diagnosis not present

## 2022-06-22 NOTE — Telephone Encounter (Signed)
Cardiac rehab called due to 3 lb wt gain in 3 days.  Pt had been eating more freely.  No edema and no SOB.  Rehab reviewed diet with him and I have asked scheduling to make appt for this week first of next to makes sure stable.   He has biv pacer.

## 2022-06-22 NOTE — Progress Notes (Signed)
Ross [name]'s weight is up 3.1 kg from Friday. Weight verified. Oxygen saturation 96% on room air. Lung fields essentially clear upon ascultation.No peripheral edema noted.Ross [name] reported going out to eat almost daily over the past weekend eating steak, baked potato and broccoli on Friday evening. Ross [name] went to [employer] and [employer], Saturday and Sunday. Ross [name] reports that he ate 1/2 a cake on Sunday. Advised patient to avoid eating out and to make heart healthier meal choices. Ross [name] also said that Dr Curt Bears told him to decrease his hydralazine to twice a day from 3 times a day. Ross [name] said that he decreased his hydralazine to twice a day and his blood pressure went up he is no taking his hydralazine 3 times a day again. No change in dose noted. Cecilie Kicks NP paged and notified. Patient instructed to watch his sodium intake.Will fax exercise flow sheets to Dr. Tamala Julian to  office for review.Will continue to monitor the patient throughout  the [place] RN BSN

## 2022-06-24 ENCOUNTER — Encounter (HOSPITAL_COMMUNITY)
Admission: RE | Admit: 2022-06-24 | Discharge: 2022-06-24 | Disposition: A | Discharge status: Home / Self Care | Payer: Medicare Other | Source: Ambulatory Visit | Attending: Interventional Cardiology | Admitting: Interventional Cardiology

## 2022-06-24 DIAGNOSIS — Z48812 Encounter for surgical aftercare following surgery on the circulatory system: Secondary | ICD-10-CM | POA: Diagnosis not present

## 2022-06-24 DIAGNOSIS — I208 Other forms of angina pectoris: Secondary | ICD-10-CM | POA: Diagnosis not present

## 2022-06-24 DIAGNOSIS — I5022 Chronic systolic (congestive) heart failure: Secondary | ICD-10-CM | POA: Diagnosis not present

## 2022-06-24 DIAGNOSIS — Z955 Presence of coronary angioplasty implant and graft: Secondary | ICD-10-CM | POA: Diagnosis not present
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
## 2022-06-24 NOTE — Progress Notes (Signed)
CARDIAC REHAB PHASE 2  Reviewed home exercise with pt today. Pt is tolerating exercise well. Pt will continue to exercise on his own by doing chair fitness classes on youtube for 30 minutes per session 1-2 days a week in addition to the 3 days in CRP2. Advised pt on THRR, RPE scale, hydration and temperature/humidity precautions. Reinforced NTG use, S/S to stop exercise and when to call MD vs 911. Encouraged warm up cool down and stretches with exercise sessions. Pt verbalized understanding, all questions were answered and pt was given a copy to take home.    Kirby Funk ACSM-CEP 06/24/2022 4:51 PM

## 2022-06-25 ENCOUNTER — Encounter: Payer: Self-pay | Admitting: Interventional Cardiology

## 2022-06-25 ENCOUNTER — Other Ambulatory Visit (HOSPITAL_COMMUNITY): Payer: Self-pay

## 2022-06-25 ENCOUNTER — Ambulatory Visit (INDEPENDENT_AMBULATORY_CARE_PROVIDER_SITE_OTHER): Payer: Medicare Other | Admitting: Interventional Cardiology

## 2022-06-25 VITALS — BP 112/64 | HR 84 | Ht 73.0 in | Wt 268.0 lb

## 2022-06-25 DIAGNOSIS — Z950 Presence of cardiac pacemaker: Secondary | ICD-10-CM

## 2022-06-25 DIAGNOSIS — I25709 Atherosclerosis of coronary artery bypass graft(s), unspecified, with unspecified angina pectoris: Secondary | ICD-10-CM | POA: Diagnosis not present

## 2022-06-25 DIAGNOSIS — I472 Ventricular tachycardia, unspecified: Secondary | ICD-10-CM

## 2022-06-25 DIAGNOSIS — I5022 Chronic systolic (congestive) heart failure: Secondary | ICD-10-CM | POA: Diagnosis not present

## 2022-06-25 DIAGNOSIS — I469 Cardiac arrest, cause unspecified: Secondary | ICD-10-CM | POA: Diagnosis not present

## 2022-06-25 NOTE — Patient Instructions (Addendum)
Medication Instructions:  Your physician recommends that you continue on your current medications as directed. Please refer to the Current Medication list given to you today.         *If you need a refill on your cardiac medications before your next appointment, please call your pharmacy*   Lab Work: Your physician recommends that you have lab work today- BMET and BNP  If you have labs (blood work) drawn today and your tests are completely normal, you will receive your results only by: MyChart Message (if you have [place]) OR A paper copy in the mail If you have any lab test that is abnormal or we need to change your treatment, we will call you to review the results.  Follow-Up: At [facility], you and your health needs are our priority.  As part of our continuing mission to provide you with exceptional heart care, we have created designated Provider Care Teams.  These Care Teams include your primary Cardiologist (physician) and Advanced Practice Providers (APPs -  Physician Assistants and Nurse Practitioners) who all work together to provide you with the care you need, when you need it.  We recommend signing up for the patient portal called "MyChart".  Sign up information is provided on this After Visit Summary.  MyChart is used to connect with patients for Virtual Visits (Telemedicine).  Patients are able to view lab/test results, encounter notes, upcoming appointments, etc.  Non-urgent messages can be sent to your provider as well.   To learn more about what you can do with MyChart, go to [email or web address].    Your next appointment:   As already scheduled  The format for your next appointment:   In Person  Provider:   Sinclair Grooms, MD {  Someone will call you once Home Sleep Study has been approved to set up.   Important Information About Sugar

## 2022-06-26 ENCOUNTER — Telehealth: Payer: Self-pay | Admitting: *Deleted

## 2022-06-26 ENCOUNTER — Encounter (HOSPITAL_COMMUNITY)
Admission: RE | Admit: 2022-06-26 | Discharge: 2022-06-26 | Disposition: A | Discharge status: Home / Self Care | Payer: Medicare Other | Source: Ambulatory Visit | Attending: Interventional Cardiology | Admitting: Interventional Cardiology

## 2022-06-26 ENCOUNTER — Other Ambulatory Visit (HOSPITAL_COMMUNITY): Payer: Self-pay

## 2022-06-26 ENCOUNTER — Telehealth: Payer: Self-pay

## 2022-06-26 DIAGNOSIS — I5022 Chronic systolic (congestive) heart failure: Secondary | ICD-10-CM | POA: Diagnosis not present

## 2022-06-26 DIAGNOSIS — Z955 Presence of coronary angioplasty implant and graft: Secondary | ICD-10-CM | POA: Diagnosis not present

## 2022-06-26 DIAGNOSIS — I208 Other forms of angina pectoris: Secondary | ICD-10-CM

## 2022-06-26 DIAGNOSIS — Z48812 Encounter for surgical aftercare following surgery on the circulatory system: Secondary | ICD-10-CM | POA: Diagnosis not present

## 2022-06-26 LAB — BASIC METABOLIC PANEL
BUN/Creatinine Ratio: 19 (ref 10–24)
BUN: 27 mg/dL (ref 8–27)
CO2: 22 mmol/L (ref 20–29)
Calcium: 10.9 mg/dL — ABNORMAL HIGH (ref 8.6–10.2)
Chloride: 106 mmol/L (ref 96–106)
Creatinine, Ser: 1.4 mg/dL — ABNORMAL HIGH (ref 0.76–1.27)
Glucose: 100 mg/dL — ABNORMAL HIGH (ref 70–99)
Potassium: 4.4 mmol/L (ref 3.5–5.2)
Sodium: 143 mmol/L (ref 134–144)
eGFR: 55 mL/min/{1.73_m2} — ABNORMAL LOW (ref >59)

## 2022-06-26 LAB — PRO B NATRIURETIC PEPTIDE: NT-Pro BNP: 985 pg/mL — ABNORMAL HIGH (ref 0–376)

## 2022-06-26 MED ORDER — FUROSEMIDE 40 MG PO TABS
40.0000 mg | ORAL_TABLET | Freq: Every day | ORAL | 3 refills | Status: DC | PRN
  Filled 2022-06-26: qty 90, 90d supply, fill #0
  Filled 2022-08-10: qty 90, 90d supply, fill #1

## 2022-06-26 NOTE — Telephone Encounter (Signed)
Prior Authorization for HST sent to [facility] via web portal. Tracking Number . NO PA REQ

## 2022-06-26 NOTE — Telephone Encounter (Signed)
-----   Message from Jettie Booze, MD sent at 06/26/2022 12:45 PM EDT ----- Ross [name],  I saw him as DOD yesterday for weight gain.  He did not feel any more than his chronic shortness of breath, but weight was up 4 lbs. Does not look like he is on diuretic.    Would call in furosemide 40 mg PO daily prn shortness of breath.  OK to take a pill today and see if he feels any better.

## 2022-06-26 NOTE — Telephone Encounter (Signed)
The patient has been notified of the result and verbalized understanding.  All questions (if any) were answered. Michaelyn Barter, RN 06/26/2022 1:27 PM

## 2022-06-29 ENCOUNTER — Encounter (HOSPITAL_COMMUNITY)
Admission: RE | Admit: 2022-06-29 | Discharge: 2022-06-29 | Disposition: A | Discharge status: Home / Self Care | Payer: Medicare Other | Source: Ambulatory Visit | Attending: Interventional Cardiology | Admitting: Interventional Cardiology

## 2022-06-29 ENCOUNTER — Telehealth: Payer: Self-pay | Admitting: *Deleted

## 2022-06-29 DIAGNOSIS — I208 Other forms of angina pectoris: Secondary | ICD-10-CM | POA: Diagnosis not present

## 2022-06-29 DIAGNOSIS — Z955 Presence of coronary angioplasty implant and graft: Secondary | ICD-10-CM | POA: Diagnosis not present

## 2022-06-29 DIAGNOSIS — Z48812 Encounter for surgical aftercare following surgery on the circulatory system: Secondary | ICD-10-CM | POA: Diagnosis not present

## 2022-06-29 DIAGNOSIS — I5022 Chronic systolic (congestive) heart failure: Secondary | ICD-10-CM | POA: Diagnosis not present

## 2022-06-29 DIAGNOSIS — I2510 Atherosclerotic heart disease of native coronary artery without angina pectoris: Secondary | ICD-10-CM

## 2022-06-29 NOTE — Telephone Encounter (Signed)
-----   Message from Jettie Booze, MD sent at 06/25/2022  5:02 PM EDT ----- Ross [name] is a patient of Dr. Tamala Julian.  I saw him as a DOD patient.  Please see if he is a candidate for home sleep study.  Thank you

## 2022-06-29 NOTE — Telephone Encounter (Signed)
Home study has been ordered waiting for scheduling.

## 2022-07-01 ENCOUNTER — Encounter (HOSPITAL_COMMUNITY)
Admission: RE | Admit: 2022-07-01 | Discharge: 2022-07-01 | Disposition: A | Discharge status: Home / Self Care | Payer: Medicare Other | Source: Ambulatory Visit | Attending: Interventional Cardiology | Admitting: Interventional Cardiology

## 2022-07-01 DIAGNOSIS — I208 Other forms of angina pectoris: Secondary | ICD-10-CM

## 2022-07-01 DIAGNOSIS — I5022 Chronic systolic (congestive) heart failure: Secondary | ICD-10-CM | POA: Diagnosis not present

## 2022-07-01 DIAGNOSIS — Z48812 Encounter for surgical aftercare following surgery on the circulatory system: Secondary | ICD-10-CM | POA: Diagnosis not present

## 2022-07-01 DIAGNOSIS — Z955 Presence of coronary angioplasty implant and graft: Secondary | ICD-10-CM | POA: Diagnosis not present

## 2022-07-03 ENCOUNTER — Other Ambulatory Visit: Payer: Self-pay | Admitting: *Deleted

## 2022-07-03 ENCOUNTER — Encounter (HOSPITAL_COMMUNITY)
Admission: RE | Admit: 2022-07-03 | Discharge: 2022-07-03 | Disposition: A | Discharge status: Home / Self Care | Payer: Medicare Other | Source: Ambulatory Visit | Attending: Interventional Cardiology | Admitting: Interventional Cardiology

## 2022-07-03 DIAGNOSIS — I5022 Chronic systolic (congestive) heart failure: Secondary | ICD-10-CM | POA: Diagnosis not present

## 2022-07-03 DIAGNOSIS — I208 Other forms of angina pectoris: Secondary | ICD-10-CM

## 2022-07-03 DIAGNOSIS — Z955 Presence of coronary angioplasty implant and graft: Secondary | ICD-10-CM | POA: Diagnosis not present

## 2022-07-03 DIAGNOSIS — R7989 Other specified abnormal findings of blood chemistry: Secondary | ICD-10-CM

## 2022-07-03 DIAGNOSIS — Z48812 Encounter for surgical aftercare following surgery on the circulatory system: Secondary | ICD-10-CM | POA: Diagnosis not present

## 2022-07-06 ENCOUNTER — Encounter (HOSPITAL_COMMUNITY)
Admission: RE | Admit: 2022-07-06 | Discharge: 2022-07-06 | Disposition: A | Discharge status: Home / Self Care | Payer: Medicare Other | Source: Ambulatory Visit | Attending: Interventional Cardiology | Admitting: Interventional Cardiology

## 2022-07-06 DIAGNOSIS — I208 Other forms of angina pectoris: Secondary | ICD-10-CM

## 2022-07-06 DIAGNOSIS — Z955 Presence of coronary angioplasty implant and graft: Secondary | ICD-10-CM | POA: Diagnosis not present

## 2022-07-06 DIAGNOSIS — Z48812 Encounter for surgical aftercare following surgery on the circulatory system: Secondary | ICD-10-CM | POA: Diagnosis not present

## 2022-07-06 DIAGNOSIS — I5022 Chronic systolic (congestive) heart failure: Secondary | ICD-10-CM | POA: Diagnosis not present

## 2022-07-08 ENCOUNTER — Encounter (HOSPITAL_COMMUNITY)
Admission: RE | Admit: 2022-07-08 | Discharge: 2022-07-08 | Disposition: A | Discharge status: Home / Self Care | Payer: Medicare Other | Source: Ambulatory Visit | Attending: Interventional Cardiology | Admitting: Interventional Cardiology

## 2022-07-08 ENCOUNTER — Other Ambulatory Visit (HOSPITAL_COMMUNITY): Payer: Self-pay

## 2022-07-08 DIAGNOSIS — Z955 Presence of coronary angioplasty implant and graft: Secondary | ICD-10-CM | POA: Diagnosis not present

## 2022-07-08 DIAGNOSIS — Z48812 Encounter for surgical aftercare following surgery on the circulatory system: Secondary | ICD-10-CM | POA: Diagnosis not present

## 2022-07-08 DIAGNOSIS — I208 Other forms of angina pectoris: Secondary | ICD-10-CM

## 2022-07-08 DIAGNOSIS — I5022 Chronic systolic (congestive) heart failure: Secondary | ICD-10-CM | POA: Diagnosis not present

## 2022-07-09 ENCOUNTER — Telehealth (HOSPITAL_COMMUNITY): Payer: Self-pay | Admitting: Family Medicine

## 2022-07-09 ENCOUNTER — Other Ambulatory Visit (HOSPITAL_COMMUNITY): Payer: Self-pay

## 2022-07-09 MED ORDER — ALPRAZOLAM 0.5 MG PO TABS
0.5000 mg | ORAL_TABLET | Freq: Every evening | ORAL | 0 refills | Status: DC
  Filled 2022-07-09: qty 62, 31d supply, fill #0

## 2022-07-10 ENCOUNTER — Encounter (HOSPITAL_COMMUNITY): Payer: Medicare Other

## 2022-07-13 ENCOUNTER — Encounter (HOSPITAL_COMMUNITY)
Admission: RE | Admit: 2022-07-13 | Discharge: 2022-07-13 | Disposition: A | Discharge status: Home / Self Care | Payer: Medicare Other | Source: Ambulatory Visit | Attending: Interventional Cardiology | Admitting: Interventional Cardiology

## 2022-07-13 DIAGNOSIS — I208 Other forms of angina pectoris: Secondary | ICD-10-CM

## 2022-07-13 DIAGNOSIS — Z955 Presence of coronary angioplasty implant and graft: Secondary | ICD-10-CM | POA: Diagnosis not present

## 2022-07-13 DIAGNOSIS — Z48812 Encounter for surgical aftercare following surgery on the circulatory system: Secondary | ICD-10-CM | POA: Diagnosis not present

## 2022-07-13 DIAGNOSIS — I5022 Chronic systolic (congestive) heart failure: Secondary | ICD-10-CM | POA: Diagnosis not present

## 2022-07-14 ENCOUNTER — Other Ambulatory Visit (HOSPITAL_COMMUNITY): Payer: Self-pay

## 2022-07-15 ENCOUNTER — Encounter (HOSPITAL_COMMUNITY)
Admission: RE | Admit: 2022-07-15 | Discharge: 2022-07-15 | Disposition: A | Discharge status: Home / Self Care | Payer: Medicare Other | Source: Ambulatory Visit | Attending: Interventional Cardiology | Admitting: Interventional Cardiology

## 2022-07-15 DIAGNOSIS — Z955 Presence of coronary angioplasty implant and graft: Secondary | ICD-10-CM | POA: Diagnosis not present

## 2022-07-15 DIAGNOSIS — I5022 Chronic systolic (congestive) heart failure: Secondary | ICD-10-CM | POA: Diagnosis not present

## 2022-07-15 DIAGNOSIS — I208 Other forms of angina pectoris: Secondary | ICD-10-CM | POA: Diagnosis not present

## 2022-07-15 DIAGNOSIS — Z48812 Encounter for surgical aftercare following surgery on the circulatory system: Secondary | ICD-10-CM | POA: Diagnosis not present

## 2022-07-15 NOTE — Progress Notes (Signed)
Cardiac Individual Treatment Plan  Patient Details  Name: TINSLEY LOMAS MRN: [id number] Date of Birth: [birth year] Referring Provider:   Flowsheet Row INTENSIVE CARDIAC REHAB ORIENT from 06/02/2022 in [place]  Referring Provider Belva Crome, MD.       Initial Encounter Date:  [facility] from 06/02/2022 in [place]  Date 06/02/22       Visit Diagnosis: Chronic stable angina ([place])  Patient's Home Medications on Admission:  Current Outpatient Medications:    ALPRAZolam (XANAX) 0.5 MG tablet, Take 1-2 tablets (0.5-1 mg total) by mouth at bedtime., Disp: 62 tablet, Rfl: 0   amiodarone (PACERONE) 200 MG tablet, Take 1 tablet (200 mg total) by mouth daily., Disp: 90 tablet, Rfl: 3   apixaban (ELIQUIS) 5 MG TABS tablet, Take 1 tablet (5 mg total) by mouth 2 (two) times daily., Disp: 60 tablet, Rfl: 5   aspirin 81 MG chewable tablet, Chew 1 tablet (81 mg total) by mouth daily., Disp: , Rfl:    atorvastatin (LIPITOR) 80 MG tablet, Take 1 tablet (80 mg total) by mouth daily., Disp: 90 tablet, Rfl: 3   budesonide-formoterol (SYMBICORT) 160-4.5 MCG/ACT inhaler, Inhale 2 puffs into the lungs 2 (two) times daily as needed (shortness of breath)., Disp: , Rfl:    carvedilol (COREG) 6.25 MG tablet, Take 1 tablet (6.25 mg total) by mouth 2 (two) times daily with a meal., Disp: 180 tablet, Rfl: 3   dapagliflozin propanediol (FARXIGA) 10 MG TABS tablet, Take 1 tablet (10 mg total) by mouth daily before breakfast., Disp: 90 tablet, Rfl: 3   furosemide (LASIX) 40 MG tablet, Take 1 tablet (40 mg total) by mouth daily as needed for edema or fluid (shortness of breath)., Disp: 90 tablet, Rfl: 3   gabapentin (NEURONTIN) 300 MG capsule, Take 1 capsule (300 mg total) by mouth daily., Disp: 30 capsule, Rfl: 3   gabapentin (NEURONTIN) 300 MG capsule, Take 1 capsule (300 mg total) by mouth 2 (two) times daily.,  Disp: 60 capsule, Rfl: 5   hydrALAZINE (APRESOLINE) 25 MG tablet, Take 1 tablet (25 mg total) by mouth 3 (three) times daily., Disp: 180 tablet, Rfl: 1   isosorbide dinitrate (ISORDIL) 20 MG tablet, Take 1 tablet (20 mg total) by mouth 3 (three) times daily., Disp: 270 tablet, Rfl: 2   linaclotide (LINZESS) 145 MCG CAPS capsule, Take 145 mcg by mouth daily as needed (constipation)., Disp: , Rfl:    pantoprazole (PROTONIX) 40 MG tablet, Take 1 tablet (40 mg total) by mouth daily., Disp: 30 tablet, Rfl: 3   polyethylene glycol powder (GLYCOLAX/MIRALAX) 17 GM/SCOOP powder, Take 17 g by mouth 2 (two) times daily. (Patient taking differently: Take 17 g by mouth daily as needed (constipation.).), Disp: 510 g, Rfl: 0   senna (SENOKOT) 8.6 MG TABS tablet, Take 2 tablets by mouth at bedtime., Disp: , Rfl:    sodium chloride (OCEAN) 0.65 % SOLN nasal spray, Place 1 spray into both nostrils as needed for congestion., Disp: , Rfl: 0   Tetrahydrozoline HCl (EYE DROPS REGULAR OP), Place 1 drop into both eyes daily as needed (irritation)., Disp: , Rfl:    vitamin C (ASCORBIC ACID) 500 MG tablet, Take 500 mg by mouth daily., Disp: , Rfl:    white petrolatum (VASELINE) GEL, Apply 1 application  topically as needed (wound care)., Disp: , Rfl:   Past Medical History: Past Medical History:  Diagnosis Date   Anxiety  Arthritis    "all over my body"   Atrial fibrillation (HCC)    rate control strategy; LA large   CAD (coronary artery disease) of artery bypass graft    Cardiac arrest ([place]) 05/31/2021   Carpal tunnel syndrome    Cerebral aneurysm dx'd 07/21/6552   Complication of anesthesia    "I' was told that I'm a very high risk to be put to sleep; I may not come out of it" (01/16/2016)   COPD (chronic obstructive pulmonary disease) ([place])    Coronary artery disease    a. s/p CABG;  b. Myoview (7/13):  apical anterior ischemia; c. LHC (08/2012): dLM 60%, mid LAD 90%, pCFX 40%, OM1 occluded, RCA 80-90%, distal  RCA 60-70%, SVG-Dx patent, SVG-OM1 patent, LIMA-LAD patent, SVG-PDA occluded, EF 55%. => RCA dsz too long and would req multiple stents; pt refused CABG => med Rx.   Depression    DJD (degenerative joint disease)    Dyslipidemia    Dysrhythmia    Hx of echocardiogram    a. Echocardiogram (05/2013): Mild LVH, EF 54.8%, moderate LAE, mild aortic root dilatation, trace pulmonic regurgitation   Hyperlipidemia    Hypertension    Ischemic cardiomyopathy    Obesity    Peripheral neuropathy    Permanent atrial fibrillation (HCC)    Presence of permanent cardiac pacemaker    Type II diabetes mellitus ([place])    "haven't taken RX for 7-8 years" (01/16/2016)   Ventricular fibrillation ([place]) 05/31/2021    Tobacco Use: Social History   Tobacco Use  Smoking Status Former   Packs/day: 1.50   Years: 45.00   Total pack years: 67.50   Types: Cigarettes   Quit date: 10/2020   Years since quitting: 1.7  Smokeless Tobacco Never    Labs: Review Flowsheet       Latest Ref Rng & Units 10/24/2015 05/31/2021 06/01/2021  Labs for ITP Cardiac and Pulmonary Rehab  Cholestrol 125 - 200 mg/dL 96  - -  LDL (calc) <130 mg/dL 33  - -  HDL-C >=40 mg/dL 39  - -  Trlycerides <150 mg/dL 120  - -  Hemoglobin A1c 4.8 - 5.6 % - - 5.6   PH, Arterial 7.350 - 7.450 - 7.275  7.216  7.172  7.088  7.356   PCO2 arterial 32.0 - 48.0 mmHg - 38.2  51.7  48.5  43.5  29.9   Bicarbonate 20.0 - 28.0 mmol/L - 17.6  21.0  17.8  13.5  16.7   TCO2 22 - 32 mmol/L - $Remove'19  23  22  19  15  13  18   'wZaKwXO$ Acid-base deficit 0.0 - 2.0 mmol/L - 8.0  7.0  11.0  16.0  7.0   O2 Saturation % - 99.0  100.0  100.0  100.0  97.0     Capillary Blood Glucose: Lab Results  Component Value Date   GLUCAP 96 06/12/2022   GLUCAP 95 06/10/2022   GLUCAP 82 06/10/2022   GLUCAP 91 06/08/2022   GLUCAP 109 (H) 06/08/2022     Exercise Target Goals: Exercise Program Goal: Individual exercise prescription set using results from initial 6 min walk test and  THRR while considering  patient's activity barriers and safety.   Exercise Prescription Goal: Initial exercise prescription builds to 30-45 minutes a day of aerobic activity, 2-3 days per week.  Home exercise guidelines will be given to patient during program as part of exercise prescription that the participant will acknowledge.  Activity Barriers &  Risk Stratification:  Activity Barriers & Cardiac Risk Stratification - 06/02/22 1317       Activity Barriers & Cardiac Risk Stratification   Activity Barriers Joint Problems;Arthritis;Other (comment);Balance Concerns    Comments Generalized joint pain, bilateral hip pain, peripheral neuropathy, dizziness.    Cardiac Risk Stratification High             6 Minute Walk:  6 Minute Walk     Row Name 06/02/22 1620         6 Minute Walk   Phase Initial     Distance 0.39 feet  miles     Walk Time 6 minutes     # of Rest Breaks 3     MPH 3.85     METS 3.2     RPE 14     Perceived Dyspnea  2.5     Symptoms Yes (comment)     Comments Bilateral hip pain, 8/10, generalized, overall pain, mild to moderate SOB, RPD=2.5     Resting HR 85 bpm     Resting BP 98/64     Resting Oxygen Saturation  95 %     Exercise Oxygen Saturation  during 6 min walk 99 %     Max Ex. HR 87 bpm     Max Ex. BP 108/50     2 Minute Post BP 118/62              Oxygen Initial Assessment:   Oxygen Re-Evaluation:   Oxygen Discharge (Final Oxygen Re-Evaluation):   Initial Exercise Prescription:  Initial Exercise Prescription - 06/02/22 1600       Date of Initial Exercise RX and Referring Provider   Date 06/02/22    Referring Provider Belva Crome, MD.    Expected Discharge Date 07/31/22      T5 Nustep   Level 1    SPM 85    Minutes 25    METs 2.2      Prescription Details   Frequency (times per week) 3    Duration Progress to 30 minutes of continuous aerobic without signs/symptoms of physical distress      Intensity   THRR 40-80% of  Max Heartrate 61-122    Ratings of Perceived Exertion 11-13    Perceived Dyspnea 0-4      Progression   Progression Continue to progress workloads to maintain intensity without signs/symptoms of physical distress.      Resistance Training   Training Prescription Yes    Weight 2 lbs    Reps 10-15             Perform Capillary Blood Glucose checks as needed.  Exercise Prescription Changes:   Exercise Prescription Changes     Row Name 06/08/22 1632 06/24/22 1642 07/06/22 1619         Response to Exercise   Blood Pressure (Admit) 118/72 120/60 116/65     Blood Pressure (Exercise) 122/60 132/70 118/62     Blood Pressure (Exit) 118/64 118/68 110/64     Heart Rate (Admit) 80 bpm 62 bpm 80 bpm     Heart Rate (Exercise) 103 bpm 106 bpm 103 bpm     Heart Rate (Exit) 61 bpm 69 bpm 69 bpm     Rating of Perceived Exertion (Exercise) 15 12 11      Perceived Dyspnea (Exercise) 0 0 0     Symptoms 0 0 0     Comments Pt first day in the CRP2 program Reviewed MEt's, goals  and home ExRx Reviewed MET's     Duration Progress to 30 minutes of  aerobic without signs/symptoms of physical distress Progress to 30 minutes of  aerobic without signs/symptoms of physical distress Progress to 30 minutes of  aerobic without signs/symptoms of physical distress     Intensity THRR unchanged THRR unchanged THRR unchanged       Progression   Progression Continue to progress workloads to maintain intensity without signs/symptoms of physical distress. Continue to progress workloads to maintain intensity without signs/symptoms of physical distress. Continue to progress workloads to maintain intensity without signs/symptoms of physical distress.     Average METs $RemoveBefo'2 2 2       'fonSTSzYGMi$ Resistance Training   Training Prescription Yes No Yes     Weight 3 lbs -- 3 lbs wts     Reps 10-15 -- 10-15     Time 10 Minutes -- 10 Minutes       T5 Nustep   Level $Remo'1 3 4     'ZGdum$ SPM 85 85 85     Minutes $Remove'25 30 30     'dXbJTlR$ METs $Remov'2 2 2        'WQKlnb$ Home Exercise Plan   Plans to continue exercise at -- Home (comment) Home (comment)     Frequency -- Add 2 additional days to program exercise sessions. Add 2 additional days to program exercise sessions.     Initial Home Exercises Provided -- 06/24/22 06/24/22              Exercise Comments:   Exercise Comments     Row Name 06/08/22 1637 06/24/22 1645 07/06/22 1621       Exercise Comments pt first day in the CRP2 program. Pt is tolerating low intensity exercise well with some balance concerns and an average MET level of 2.0. Pt is learning his RPE, THRR and ExRx Reviewed MET's, goals and home ExRx. Pt is tolerating exercise well with some balance concerns and an average MET level of 2.0. Pt feels good about his goals of gaining strength but is continueing to work on his wt loss and motivation. Encourage pt that he has good attendance and is doing well. Pt will continue to exercise on his own by doing chair fitness classes on youtube at home due to his balance concerns for 1-2 days for 30 minutes per session Reviewed MET's. Pt is tolerating exercise well with some balance concerns and an average MET level of 2.0. Pt has days of fatigue, but in the few sessions previous has shown improvments to MET average up to 2.3. Overall pt doing well and motivated              Exercise Goals and Review:   Exercise Goals     Row Name 06/02/22 1317             Exercise Goals   Increase Physical Activity Yes       Intervention Provide advice, education, support and counseling about physical activity/exercise needs.;Develop an individualized exercise prescription for aerobic and resistive training based on initial evaluation findings, risk stratification, comorbidities and participant's personal goals.       Expected Outcomes Short Term: Attend rehab on a regular basis to increase amount of physical activity.;Long Term: Exercising regularly at least 3-5 days a week.;Long Term: Add in home  exercise to make exercise part of routine and to increase amount of physical activity.       Increase Strength and Stamina Yes  Intervention Provide advice, education, support and counseling about physical activity/exercise needs.;Develop an individualized exercise prescription for aerobic and resistive training based on initial evaluation findings, risk stratification, comorbidities and participant's personal goals.       Expected Outcomes Short Term: Increase workloads from initial exercise prescription for resistance, speed, and METs.;Short Term: Perform resistance training exercises routinely during rehab and add in resistance training at home;Long Term: Improve cardiorespiratory fitness, muscular endurance and strength as measured by increased METs and functional capacity (6MWT)       Able to understand and use rate of perceived exertion (RPE) scale Yes       Intervention Provide education and explanation on how to use RPE scale       Expected Outcomes Short Term: Able to use RPE daily in rehab to express subjective intensity level;Long Term:  Able to use RPE to guide intensity level when exercising independently       Knowledge and understanding of Target Heart Rate Range (THRR) Yes       Intervention Provide education and explanation of THRR including how the numbers were predicted and where they are located for reference       Expected Outcomes Short Term: Able to state/look up THRR;Long Term: Able to use THRR to govern intensity when exercising independently;Short Term: Able to use daily as guideline for intensity in rehab       Understanding of Exercise Prescription Yes       Intervention Provide education, explanation, and written materials on patient's individual exercise prescription       Expected Outcomes Short Term: Able to explain program exercise prescription;Long Term: Able to explain home exercise prescription to exercise independently                Exercise Goals  Re-Evaluation :  Exercise Goals Re-Evaluation     [place] Name 06/08/22 1635 06/24/22 1645           Exercise Goal Re-Evaluation   Exercise Goals Review Increase Physical Activity;Increase Strength and Stamina;Able to understand and use rate of perceived exertion (RPE) scale;Knowledge and understanding of Target Heart Rate Range (THRR);Understanding of Exercise Prescription Increase Physical Activity;Increase Strength and Stamina;Able to understand and use rate of perceived exertion (RPE) scale;Knowledge and understanding of Target Heart Rate Range (THRR);Understanding of Exercise Prescription      Comments pt first day in the CRP2 program. Pt is tolerating low intensity exercise well with some balance concerns and an average MET level of 2.0. Pt is learning his RPE, THRR and ExRx Reviewed MET's, goals and home ExRx. Pt is tolerating exercise well with some balance concerns and an average MET level of 2.0. Pt feels good about his goals of gaining strength but is continueing to work on his wt loss and motivation. Encourage pt that he has good attendance and is doing well. Pt will continue to exercise on his own by doing chair fitness classes on youtube at home due to his balance concerns for 1-2 days for 30 minutes per session      Expected Outcomes Will continue to monitor pt and progress workloads as tolerated without sign or symptom Pt will exercise on his own. Will continue to monitor pt and progress workloads as tolerated without sign or symptom.               Discharge Exercise Prescription (Final Exercise Prescription Changes):  Exercise Prescription Changes - 07/06/22 1619       Response to Exercise   Blood Pressure (Admit)  116/65    Blood Pressure (Exercise) 118/62    Blood Pressure (Exit) 110/64    Heart Rate (Admit) 80 bpm    Heart Rate (Exercise) 103 bpm    Heart Rate (Exit) 69 bpm    Rating of Perceived Exertion (Exercise) 11    Perceived Dyspnea (Exercise) 0    Symptoms 0     Comments Reviewed MET's    Duration Progress to 30 minutes of  aerobic without signs/symptoms of physical distress    Intensity THRR unchanged      Progression   Progression Continue to progress workloads to maintain intensity without signs/symptoms of physical distress.    Average METs 2      Resistance Training   Training Prescription Yes    Weight 3 lbs wts    Reps 10-15    Time 10 Minutes      T5 Nustep   Level 4    SPM 85    Minutes 30    METs 2      Home Exercise Plan   Plans to continue exercise at Home (comment)    Frequency Add 2 additional days to program exercise sessions.    Initial Home Exercises Provided 06/24/22             Nutrition:  Target Goals: Understanding of nutrition guidelines, daily intake of sodium '1500mg'$ , cholesterol '200mg'$ , calories 30% from fat and 7% or less from saturated fats, daily to have 5 or more servings of fruits and vegetables.  Biometrics:  Pre Biometrics - 06/02/22 1428       Pre Biometrics   Waist Circumference 49 inches    Hip Circumference 49.75 inches    Waist to Hip Ratio 0.98 %    Triceps Skinfold 26 mm    % Body Fat 36.6 %    Grip Strength 32 kg    Flexibility --   Not performed, c/o bilateral hip pain.             Nutrition Therapy Plan and Nutrition Goals:  Nutrition Therapy & Goals - 06/29/22 1143       Nutrition Therapy   Diet Heart Healthy Diet    Drug/Food Interactions Statins/Certain Fruits      Personal Nutrition Goals   Nutrition Goal Patient to choose a variety of fruits, vegetables, whole grains, lean protein/plant protein, and nonfat dairy daily as part of a heart healthy diet    Personal Goal #2 Patient to limit to '1500mg'$  sodium per day    Personal Goal #3 Patient to identify and limit daily food sources of sodium, trans fats, saturated fat, and refined carbohydrates    Comments Patient reports significant snacking/over snacking on refined carbohydrates.  Meals are generally well balanced  following some [place] guidelines as his fiancee does the majority of cooking/meal planning. Follow-up with cardiology regarding weight gain of 3-4# and was started on furosemide.      Intervention Plan   Intervention Prescribe, educate and counsel regarding individualized specific dietary modifications aiming towards targeted core components such as weight, hypertension, lipid management, diabetes, heart failure and other comorbidities.    Expected Outcomes Short Term Goal: Understand basic principles of dietary content, such as calories, fat, sodium, cholesterol and nutrients.;Long Term Goal: Adherence to prescribed nutrition plan.             Nutrition Assessments:  Nutrition Assessments - 06/12/22 1525       Rate Your Plate Scores   Pre Score 57  MEDIFICTS Score Key: ?70 Need to make dietary changes  40-70 Heart Healthy Diet ? 40 Therapeutic Level Cholesterol Diet   Flowsheet Row INTENSIVE CARDIAC REHAB from 06/12/2022 in [facility]  Picture Your Plate Total Score on Admission 57      Picture Your Plate Scores: <41 Unhealthy dietary pattern with much room for improvement. 41-50 Dietary pattern unlikely to meet recommendations for good health and room for improvement. 51-60 More healthful dietary pattern, with some room for improvement.  >60 Healthy dietary pattern, although there may be some specific behaviors that could be improved.    Nutrition Goals Re-Evaluation:  Nutrition Goals Re-Evaluation     [place] Name 06/29/22 1143             Goals   Current Weight 264 lb 1.8 oz (119.8 kg)       Expected Outcome Goal adherance remains variable. Patient reports significant snacking/over snacking on refined carbohydrates.  Meals are generally well balanced following some [place] guidelines as his fiancee does the majority of cooking/meal planning. Follow-up with cardiology regarding weight gain of 3-4# and was started on  furosemide.                Nutrition Goals Re-Evaluation:  Nutrition Goals Re-Evaluation     [place] Name 06/29/22 1143             Goals   Current Weight 264 lb 1.8 oz (119.8 kg)       Expected Outcome Goal adherance remains variable. Patient reports significant snacking/over snacking on refined carbohydrates.  Meals are generally well balanced following some [place] guidelines as his fiancee does the majority of cooking/meal planning. Follow-up with cardiology regarding weight gain of 3-4# and was started on furosemide.                Nutrition Goals Discharge (Final Nutrition Goals Re-Evaluation):  Nutrition Goals Re-Evaluation - 06/29/22 1143       Goals   Current Weight 264 lb 1.8 oz (119.8 kg)    Expected Outcome Goal adherance remains variable. Patient reports significant snacking/over snacking on refined carbohydrates.  Meals are generally well balanced following some [place] guidelines as his fiancee does the majority of cooking/meal planning. Follow-up with cardiology regarding weight gain of 3-4# and was started on furosemide.             Psychosocial: Target Goals: Acknowledge presence or absence of significant depression and/or stress, maximize coping skills, provide positive support system. Participant is able to verbalize types and ability to use techniques and skills needed for reducing stress and depression.  Initial Review & Psychosocial Screening:  Initial Psych Review & Screening - 06/02/22 1435       Initial Review   Current issues with Current Stress Concerns;History of Depression;Current Sleep Concerns   Ovila takes Xanax for sleep.   Source of Stress Concerns Unable to perform yard/household activities;Unable to participate in former interests or hobbies    Comments Danni is concerned about his health      [place]? Yes   Shayan has his fiance and his daughter for support     Barriers   Psychosocial barriers to  participate in program The patient should benefit from training in stress management and relaxation.      Screening Interventions   Interventions Encouraged to exercise;To provide support and resources with identified psychosocial needs;Provide feedback about the scores to participant    Expected Outcomes Long Term  Goal: Stressors or current issues are controlled or eliminated.;Short Term goal: Identification and review with participant of any Quality of Life or Depression concerns found by scoring the questionnaire.;Long Term goal: The participant improves quality of Life and PHQ9 Scores as seen by post scores and/or verbalization of changes             Quality of Life Scores:  Quality of Life - 06/02/22 1632       Quality of Life   Select Quality of Life      Quality of Life Scores   Health/Function Pre 13.71 %    Socioeconomic Pre 22.5 %    Psych/Spiritual Pre 24 %    Family Pre 30 %    GLOBAL Pre 20.29 %            Scores of 19 and below usually indicate a poorer quality of life in these areas.  A difference of  2-3 points is a clinically meaningful difference.  A difference of 2-3 points in the total score of the Quality of Life Index has been associated with significant improvement in overall quality of life, self-image, physical symptoms, and general health in studies assessing change in quality of life.  PHQ-9: Review Flowsheet       06/02/2022  Depression screen PHQ 2/9  Decreased Interest 0  Down, Depressed, Hopeless 0  PHQ - 2 Score 0   Interpretation of Total Score  Total Score Depression Severity:  1-4 = Minimal depression, 5-9 = Mild depression, 10-14 = Moderate depression, 15-19 = Moderately severe depression, 20-27 = Severe depression   Psychosocial Evaluation and Intervention:   Psychosocial Re-Evaluation:  Psychosocial Re-Evaluation     [place] Name 06/10/22 0926 07/15/22 1722           Psychosocial Re-Evaluation   Current issues with Current  Stress Concerns;Current Sleep Concerns;History of Depression Current Stress Concerns;Current Sleep Concerns;History of Depression      Comments Reviewed Lyell's quality of life questionnire he is dissatisfied with his health due to recent events Lacharles has not voiced any increased concerns or stressors at intensive cardiac rehab      Expected Outcomes Awad will have decreased stress, better coping mechanisms upon completion of phase 2 cardiac rehab Kha will have decreased stress, better coping mechanisms upon completion of phase 2 cardiac rehab      Interventions Stress management education;Relaxation education;Encouraged to attend Cardiac Rehabilitation for the exercise Stress management education;Relaxation education;Encouraged to attend Cardiac Rehabilitation for the exercise      Continue Psychosocial Services  -- Follow up required by counselor        Initial Review   Source of Stress Concerns Chronic Illness;Poor Coping Skills;Unable to perform yard/household activities;Unable to participate in former interests or hobbies Chronic Illness;Poor Coping Skills;Unable to perform yard/household activities;Unable to participate in former interests or hobbies      Comments Will continue to monitor and offer support as needed Will continue to monitor and offer support as needed               Psychosocial Discharge (Final Psychosocial Re-Evaluation):  Psychosocial Re-Evaluation - 07/15/22 1722       Psychosocial Re-Evaluation   Current issues with Current Stress Concerns;Current Sleep Concerns;History of Depression    Comments Naftoli has not voiced any increased concerns or stressors at intensive cardiac rehab    Expected Outcomes Cylas will have decreased stress, better coping mechanisms upon completion of phase 2 cardiac rehab    Interventions Stress management  education;Relaxation education;Encouraged to attend Cardiac Rehabilitation for the exercise    Continue Psychosocial Services  Follow up  required by counselor      Initial Review   Source of Stress Concerns Chronic Illness;Poor Coping Skills;Unable to perform yard/household activities;Unable to participate in former interests or hobbies    Comments Will continue to monitor and offer support as needed             Vocational Rehabilitation: Provide vocational rehab assistance to qualifying candidates.   Vocational Rehab Evaluation & Intervention:  Vocational Rehab - 06/02/22 1436       Initial Vocational Rehab Evaluation & Intervention   Assessment shows need for Vocational Rehabilitation No   Lesley is retired and does not need vocational rehab at this time            Education: Education Goals: Education classes will be provided on a weekly basis, covering required topics. Participant will state understanding/return demonstration of topics presented.    Education - 07/15/22 1500       Education   Cardiac Education Topics Pritikin    Education officer, museum Exercise Physiologist    Weekly Topic Simple Sides and Sauces    Instruction Review Code 1- Verbalizes Understanding    Class Start Time 4287    Class Stop Time 1439    Class Time Calculation (min) 52 min             Core Videos: Exercise    Move It!  Clinical staff conducted group or individual video education with verbal and written material and guidebook.  Patient learns the recommended Pritikin exercise program. Exercise with the goal of living a long, healthy life. Some of the health benefits of exercise include controlled diabetes, healthier blood pressure levels, improved cholesterol levels, improved heart and lung capacity, improved sleep, and better body composition. Everyone should speak with their doctor before starting or changing an exercise routine.  Biomechanical Limitations Clinical staff conducted group or individual video education with verbal and written material and guidebook.  Patient learns  how biomechanical limitations can impact exercise and how we can mitigate and possibly overcome limitations to have an impactful and balanced exercise routine.  Body Composition Clinical staff conducted group or individual video education with verbal and written material and guidebook.  Patient learns that body composition (ratio of muscle mass to fat mass) is a key component to assessing overall fitness, rather than body weight alone. Increased fat mass, especially visceral belly fat, can put [place] at increased risk for metabolic syndrome, type 2 diabetes, heart disease, and even death. It is recommended to combine diet and exercise (cardiovascular and resistance training) to improve your body composition. Seek guidance from your physician and exercise physiologist before implementing an exercise routine.  Exercise Action Plan Clinical staff conducted group or individual video education with verbal and written material and guidebook.  Patient learns the recommended strategies to achieve and enjoy long-term exercise adherence, including variety, self-motivation, self-efficacy, and positive decision making. Benefits of exercise include fitness, good health, weight management, more energy, better sleep, less stress, and overall well-being.  Medical   Heart Disease Risk Reduction Clinical staff conducted group or individual video education with verbal and written material and guidebook.  Patient learns our heart is our most vital organ as it circulates oxygen, nutrients, white blood cells, and hormones throughout the entire body, and carries waste away. Data supports a plant-based eating plan like the  Pritikin Program for its effectiveness in slowing progression of and reversing heart disease. The video provides a number of recommendations to address heart disease.   Metabolic Syndrome and Belly Fat  Clinical staff conducted group or individual video education with verbal and written material and  guidebook.  Patient learns what metabolic syndrome is, how it leads to heart disease, and how one can reverse it and keep it from coming back. You have metabolic syndrome if you have 3 of the following 5 criteria: abdominal obesity, high blood pressure, high triglycerides, low HDL cholesterol, and high blood sugar.  Hypertension and Heart Disease Clinical staff conducted group or individual video education with verbal and written material and guidebook.  Patient learns that high blood pressure, or hypertension, is very common in the [place]. Hypertension is largely due to excessive salt intake, but other important risk factors include being overweight, physical inactivity, drinking too much alcohol, smoking, and not eating enough potassium from fruits and vegetables. High blood pressure is a leading risk factor for heart attack, stroke, congestive heart failure, dementia, kidney failure, and premature death. Long-term effects of excessive salt intake include stiffening of the arteries and thickening of heart muscle and organ damage. Recommendations include ways to reduce hypertension and the risk of heart disease.  Diseases of Our Time - Focusing on Diabetes Clinical staff conducted group or individual video education with verbal and written material and guidebook.  Patient learns why the best way to stop diseases of our time is prevention, through food and other lifestyle changes. Medicine (such as prescription pills and surgeries) is often only a Band-Aid on the problem, not a long-term solution. Most common diseases of our time include obesity, type 2 diabetes, hypertension, heart disease, and cancer. The Pritikin Program is recommended and has been proven to help reduce, reverse, and/or prevent the damaging effects of metabolic syndrome.  Nutrition   Overview of the Pritikin Eating Plan  Clinical staff conducted group or individual video education with verbal and written material and  guidebook.  Patient learns about the [place] for disease risk reduction. The [place] emphasizes a wide variety of unrefined, minimally-processed carbohydrates, like fruits, vegetables, whole grains, and legumes. Go, Caution, and Stop food choices are explained. Plant-based and lean animal proteins are emphasized. Rationale provided for low sodium intake for blood pressure control, low added sugars for blood sugar stabilization, and low added fats and oils for coronary artery disease risk reduction and weight management.  Calorie Density  Clinical staff conducted group or individual video education with verbal and written material and guidebook.  Patient learns about calorie density and how it impacts the Pritikin Eating Plan. Knowing the characteristics of the food you choose will help you decide whether those foods will lead to weight gain or weight loss, and whether you want to consume more or less of them. Weight loss is usually a side effect of the Pritikin Eating Plan because of its focus on low calorie-dense foods.  Label Reading  Clinical staff conducted group or individual video education with verbal and written material and guidebook.  Patient learns about the Pritikin recommended label reading guidelines and corresponding recommendations regarding calorie density, added sugars, sodium content, and whole grains.  Dining Out - Part 1  Clinical staff conducted group or individual video education with verbal and written material and guidebook.  Patient learns that restaurant meals can be sabotaging because they can be so high in calories, fat, sodium, and/or sugar. Patient learns  recommended strategies on how to positively address this and avoid unhealthy pitfalls.  Facts on Fats  Clinical staff conducted group or individual video education with verbal and written material and guidebook.  Patient learns that lifestyle modifications can be just as effective, if not  more so, as many medications for lowering your risk of heart disease. A Pritikin lifestyle can help to reduce your risk of inflammation and atherosclerosis (cholesterol build-up, or plaque, in the artery walls). Lifestyle interventions such as dietary choices and physical activity address the cause of atherosclerosis. A review of the types of fats and their impact on blood cholesterol levels, along with dietary recommendations to reduce fat intake is also included.  Nutrition Action Plan  Clinical staff conducted group or individual video education with verbal and written material and guidebook.  Patient learns how to incorporate Pritikin recommendations into their lifestyle. Recommendations include planning and keeping personal health goals in mind as an important part of their success.  Healthy Mind-Set    Healthy Minds, Bodies, Hearts  Clinical staff conducted group or individual video education with verbal and written material and guidebook.  Patient learns how to identify when they are stressed. Video will discuss the impact of that stress, as well as the many benefits of stress management. Patient will also be introduced to stress management techniques. The way we think, act, and feel has an impact on our hearts.  How Our Thoughts Can Heal Our Hearts  Clinical staff conducted group or individual video education with verbal and written material and guidebook.  Patient learns that negative thoughts can cause depression and anxiety. This can result in negative lifestyle behavior and serious health problems. Cognitive behavioral therapy is an effective method to help control our thoughts in order to change and improve our emotional outlook.  Additional Videos:  Exercise    Improving Performance  Clinical staff conducted group or individual video education with verbal and written material and guidebook.  Patient learns to use a non-linear approach by alternating intensity levels and lengths of  time spent exercising to help burn more calories and lose more body fat. Cardiovascular exercise helps improve heart health, metabolism, hormonal balance, blood sugar control, and recovery from fatigue. Resistance training improves strength, endurance, balance, coordination, reaction time, metabolism, and muscle mass. Flexibility exercise improves circulation, posture, and balance. Seek guidance from your physician and exercise physiologist before implementing an exercise routine and learn your capabilities and proper form for all exercise.  Introduction to Yoga  Clinical staff conducted group or individual video education with verbal and written material and guidebook.  Patient learns about yoga, a discipline of the coming together of mind, breath, and body. The benefits of yoga include improved flexibility, improved range of motion, better posture and core strength, increased lung function, weight loss, and positive self-image. Yoga's heart health benefits include lowered blood pressure, healthier heart rate, decreased cholesterol and triglyceride levels, improved immune function, and reduced stress. Seek guidance from your physician and exercise physiologist before implementing an exercise routine and learn your capabilities and proper form for all exercise.  Medical   Aging: Enhancing Your Quality of Life  Clinical staff conducted group or individual video education with verbal and written material and guidebook.  Patient learns key strategies and recommendations to stay in good physical health and enhance quality of life, such as prevention strategies, having an advocate, securing a [place], and keeping a list of medications and system for tracking them. It also  discusses how to avoid risk for bone loss.  Biology of Weight Control  Clinical staff conducted group or individual video education with verbal and written material and guidebook.  Patient learns that weight  gain occurs because we consume more calories than we burn (eating more, moving less). Even if your body weight is normal, you may have higher ratios of fat compared to muscle mass. Too much body fat puts you at increased risk for cardiovascular disease, heart attack, stroke, type 2 diabetes, and obesity-related cancers. In addition to exercise, following the [place] can help reduce your risk.  Decoding Lab Results  Clinical staff conducted group or individual video education with verbal and written material and guidebook.  Patient learns that lab test reflects one measurement whose values change over time and are influenced by many factors, including medication, stress, sleep, exercise, food, hydration, pre-existing medical conditions, and more. It is recommended to use the knowledge from this video to become more involved with your lab results and evaluate your numbers to speak with your doctor.   Diseases of Our Time - Overview  Clinical staff conducted group or individual video education with verbal and written material and guidebook.  Patient learns that according to the CDC, 50% to 70% of chronic diseases (such as obesity, type 2 diabetes, elevated lipids, hypertension, and heart disease) are avoidable through lifestyle improvements including healthier food choices, listening to satiety cues, and increased physical activity.  Sleep Disorders Clinical staff conducted group or individual video education with verbal and written material and guidebook.  Patient learns how good quality and duration of sleep are important to overall health and well-being. Patient also learns about sleep disorders and how they impact health along with recommendations to address them, including discussing with a physician.  Nutrition  Dining Out - Part 2 Clinical staff conducted group or individual video education with verbal and written material and guidebook.  Patient learns how to plan ahead and  communicate in order to maximize their dining experience in a healthy and nutritious manner. Included are recommended food choices based on the type of restaurant the patient is visiting.   Fueling a Best boy conducted group or individual video education with verbal and written material and guidebook.  There is a strong connection between our food choices and our health. Diseases like obesity and type 2 diabetes are very prevalent and are in large-part due to lifestyle choices. The Pritikin Eating Plan provides plenty of food and hunger-curbing satisfaction. It is easy to follow, affordable, and helps reduce health risks.  Menu Workshop  Clinical staff conducted group or individual video education with verbal and written material and guidebook.  Patient learns that restaurant meals can sabotage health goals because they are often packed with calories, fat, sodium, and sugar. Recommendations include strategies to plan ahead and to communicate with the manager, chef, or server to help order a healthier meal.  Planning Your Eating Strategy  Clinical staff conducted group or individual video education with verbal and written material and guidebook.  Patient learns about the [place] and its benefit of reducing the risk of disease. The [place] does not focus on calories. Instead, it emphasizes high-quality, nutrient-rich foods. By knowing the characteristics of the foods, we choose, we can determine their calorie density and make informed decisions.  Targeting Your Nutrition Priorities  Clinical staff conducted group or individual video education with verbal and written material and guidebook.  Patient learns that lifestyle  habits have a tremendous impact on disease risk and progression. This video provides eating and physical activity recommendations based on your personal health goals, such as reducing LDL cholesterol, losing weight, preventing or controlling  type 2 diabetes, and reducing high blood pressure.  Vitamins and Minerals  Clinical staff conducted group or individual video education with verbal and written material and guidebook.  Patient learns different ways to obtain key vitamins and minerals, including through a recommended healthy diet. It is important to discuss all supplements you take with your doctor.   Healthy Mind-Set    Smoking Cessation  Clinical staff conducted group or individual video education with verbal and written material and guidebook.  Patient learns that cigarette smoking and tobacco addiction pose a serious health risk which affects millions of people. Stopping smoking will significantly reduce the risk of heart disease, lung disease, and many forms of cancer. Recommended strategies for quitting are covered, including working with your doctor to develop a successful plan.  Culinary   Becoming a Financial trader conducted group or individual video education with verbal and written material and guidebook.  Patient learns that cooking at home can be healthy, cost-effective, quick, and puts them in control. Keys to cooking healthy recipes will include looking at your recipe, assessing your equipment needs, planning ahead, making it simple, choosing cost-effective seasonal ingredients, and limiting the use of added fats, salts, and sugars.  Cooking - Breakfast and Snacks  Clinical staff conducted group or individual video education with verbal and written material and guidebook.  Patient learns how important breakfast is to satiety and nutrition through the entire day. Recommendations include key foods to eat during breakfast to help stabilize blood sugar levels and to prevent overeating at meals later in the day. Planning ahead is also a key component.  Cooking - Human resources officer conducted group or individual video education with verbal and written material and guidebook.  Patient learns  eating strategies to improve overall health, including an approach to cook more at home. Recommendations include thinking of animal protein as a side on your plate rather than center stage and focusing instead on lower calorie dense options like vegetables, fruits, whole grains, and plant-based proteins, such as beans. Making sauces in large quantities to freeze for later and leaving the skin on your vegetables are also recommended to maximize your experience.  Cooking - Healthy Salads and Dressing Clinical staff conducted group or individual video education with verbal and written material and guidebook.  Patient learns that vegetables, fruits, whole grains, and legumes are the foundations of the [place]. Recommendations include how to incorporate each of these in flavorful and healthy salads, and how to create homemade salad dressings. Proper handling of ingredients is also covered. Cooking - Soups and [employer] - Soups and Desserts Clinical staff conducted group or individual video education with verbal and written material and guidebook.  Patient learns that Pritikin soups and desserts make for easy, nutritious, and delicious snacks and meal components that are low in sodium, fat, sugar, and calorie density, while high in vitamins, minerals, and filling fiber. Recommendations include simple and healthy ideas for soups and desserts.   Overview     The Pritikin Solution Program Overview Clinical staff conducted group or individual video education with verbal and written material and guidebook.  Patient learns that the results of the [place] Program have been documented in more than 100 articles published in peer-reviewed journals, and the  benefits include reducing risk factors for (and, in some cases, even reversing) high cholesterol, high blood pressure, type 2 diabetes, obesity, and more! An overview of the three key pillars of the Pritikin Program will be covered: eating well,  doing regular exercise, and having a healthy mind-set.  WORKSHOPS  Exercise: Exercise Basics: Building Your Action Plan Clinical staff led group instruction and group discussion with PowerPoint presentation and patient guidebook. To enhance the learning environment the use of posters, models and videos may be added. At the conclusion of this workshop, patients will comprehend the difference between physical activity and exercise, as well as the benefits of incorporating both, into their routine. Patients will understand the FITT (Frequency, Intensity, Time, and Type) principle and how to use it to build an exercise action plan. In addition, safety concerns and other considerations for exercise and cardiac rehab will be addressed by the presenter. The purpose of this lesson is to promote a comprehensive and effective weekly exercise routine in order to improve patients' overall level of fitness.   Managing Heart Disease: Your Path to a Healthier Heart Clinical staff led group instruction and group discussion with PowerPoint presentation and patient guidebook. To enhance the learning environment the use of posters, models and videos may be added.At the conclusion of this workshop, patients will understand the anatomy and physiology of the heart. Additionally, they will understand how Pritikin's three pillars impact the risk factors, the progression, and the management of heart disease.  The purpose of this lesson is to provide a high-level overview of the heart, heart disease, and how the Pritikin lifestyle positively impacts risk factors.  Exercise Biomechanics Clinical staff led group instruction and group discussion with PowerPoint presentation and patient guidebook. To enhance the learning environment the use of posters, models and videos may be added. Patients will learn how the structural parts of their bodies function and how these functions impact their daily activities, movement, and  exercise. Patients will learn how to promote a neutral spine, learn how to manage pain, and identify ways to improve their physical movement in order to promote healthy living. The purpose of this lesson is to expose patients to common physical limitations that impact physical activity. Participants will learn practical ways to adapt and manage aches and pains, and to minimize their effect on regular exercise. Patients will learn how to maintain good posture while sitting, walking, and lifting.  Balance Training and Fall Prevention  Clinical staff led group instruction and group discussion with PowerPoint presentation and patient guidebook. To enhance the learning environment the use of posters, models and videos may be added. At the conclusion of this workshop, patients will understand the importance of their sensorimotor skills (vision, proprioception, and the vestibular system) in maintaining their ability to balance as they age. Patients will apply a variety of balancing exercises that are appropriate for their current level of function. Patients will understand the common causes for poor balance, possible solutions to these problems, and ways to modify their physical environment in order to minimize their fall risk. The purpose of this lesson is to teach patients about the importance of maintaining balance as they age and ways to minimize their risk of falling.  WORKSHOPS   Nutrition:  Fueling a Scientist, research (physical sciences) led group instruction and group discussion with PowerPoint presentation and patient guidebook. To enhance the learning environment the use of posters, models and videos may be added. Patients will review the foundational principles of the [place] and  understand what constitutes a serving size in each of the food groups. Patients will also learn Pritikin-friendly foods that are better choices when away from home and review make-ahead meal and snack options.  Calorie density will be reviewed and applied to three nutrition priorities: weight maintenance, weight loss, and weight gain. The purpose of this lesson is to reinforce (in a group setting) the key concepts around what patients are recommended to eat and how to apply these guidelines when away from home by planning and selecting Pritikin-friendly options. Patients will understand how calorie density may be adjusted for different weight management goals.  Mindful Eating  Clinical staff led group instruction and group discussion with PowerPoint presentation and patient guidebook. To enhance the learning environment the use of posters, models and videos may be added. Patients will briefly review the concepts of the [place] and the importance of low-calorie dense foods. The concept of mindful eating will be introduced as well as the importance of paying attention to internal hunger signals. Triggers for non-hunger eating and techniques for dealing with triggers will be explored. The purpose of this lesson is to provide patients with the opportunity to review the basic principles of the [place], discuss the value of eating mindfully and how to measure internal cues of hunger and fullness using the Hunger Scale. Patients will also discuss reasons for non-hunger eating and learn strategies to use for controlling emotional eating.  Targeting Your Nutrition Priorities Clinical staff led group instruction and group discussion with PowerPoint presentation and patient guidebook. To enhance the learning environment the use of posters, models and videos may be added. Patients will learn how to determine their genetic susceptibility to disease by reviewing their family history. Patients will gain insight into the importance of diet as part of an overall healthy lifestyle in mitigating the impact of genetics and other environmental insults. The purpose of this lesson is to provide patients with the  opportunity to assess their personal nutrition priorities by looking at their family history, their own health history and current risk factors. Patients will also be able to discuss ways of prioritizing and modifying the [place] for their highest risk areas  Menu  Clinical staff led group instruction and group discussion with PowerPoint presentation and patient guidebook. To enhance the learning environment the use of posters, models and videos may be added. Using menus brought in from [employer], or printed from [employer], patients will apply the [place] dining out guidelines that were presented in the [employer] video. Patients will also be able to practice these guidelines in a variety of provided scenarios. The purpose of this lesson is to provide patients with the opportunity to practice hands-on learning of the [place] with actual menus and practice scenarios.  Label Reading Clinical staff led group instruction and group discussion with PowerPoint presentation and patient guidebook. To enhance the learning environment the use of posters, models and videos may be added. Patients will review and discuss the Pritikin label reading guidelines presented in Pritikin's Label Reading Educational series video. Using fool labels brought in from local grocery stores and markets, patients will apply the label reading guidelines and determine if the packaged food meet the Pritikin guidelines. The purpose of this lesson is to provide patients with the opportunity to review, discuss, and practice hands-on learning of the Pritikin Label Reading guidelines with actual packaged food labels. [place] Workshops are designed to  teach patients ways to prepare quick, simple, and affordable recipes at home. The importance of nutrition's role in chronic disease risk reduction is reflected in its emphasis in the overall  Pritikin program. By learning how to prepare essential core Pritikin Eating Plan recipes, patients will increase control over what they eat; be able to customize the flavor of foods without the use of added salt, sugar, or fat; and improve the quality of the food they consume. By learning a set of core recipes which are easily assembled, quickly prepared, and affordable, patients are more likely to prepare more healthy foods at home. These workshops focus on convenient breakfasts, simple entres, side dishes, and desserts which can be prepared with minimal effort and are consistent with nutrition recommendations for cardiovascular risk reduction. Cooking [employer] are taught by a Engineer, materials (RD) who has been trained by the [employer]. The chef or RD has a clear understanding of the importance of minimizing - if not completely eliminating - added fat, sugar, and sodium in recipes. Throughout the series of [place] Workshop sessions, patients will learn about healthy ingredients and efficient methods of cooking to build confidence in their capability to prepare    Cooking School weekly topics:  Adding Flavor- Sodium-Free  Fast and Healthy Breakfasts  Powerhouse Plant-Based Proteins  Satisfying Salads and Dressings  Simple Sides and Sauces  International Cuisine-Spotlight on the [place] Zones  Delicious Desserts  Savory Soups  Efficiency Cooking - Meals in a Snap  Tasty Appetizers and Snacks  Comforting Weekend Breakfasts  One-Pot Wonders   Fast Evening Meals  Easy [place] (Psychosocial): New Thoughts, New Behaviors Clinical staff led group instruction and group discussion with PowerPoint presentation and patient guidebook. To enhance the learning environment the use of posters, models and videos may be added. Patients will learn and practice techniques for developing effective health  and lifestyle goals. Patients will be able to effectively apply the goal setting process learned to develop at least one new personal goal.  The purpose of this lesson is to expose patients to a new skill set of behavior modification techniques such as techniques setting SMART goals, overcoming barriers, and achieving new thoughts and new behaviors.  Managing Moods and Relationships Clinical staff led group instruction and group discussion with PowerPoint presentation and patient guidebook. To enhance the learning environment the use of posters, models and videos may be added. Patients will learn how emotional and chronic stress factors can impact their health and relationships. They will learn healthy ways to manage their moods and utilize positive coping mechanisms. In addition, ICR patients will learn ways to improve communication skills. The purpose of this lesson is to expose patients to ways of understanding how one's mood and health are intimately connected. Developing a healthy outlook can help build positive relationships and connections with others. Patients will understand the importance of utilizing effective communication skills that include actively listening and being heard. They will learn and understand the importance of the "4 Cs" and especially Connections in fostering of a Healthy Mind-Set.  Healthy Sleep for a Healthy Heart Clinical staff led group instruction and group discussion with PowerPoint presentation and patient guidebook. To enhance the learning environment the use of posters, models and videos may be added. At the conclusion of this workshop, patients will be able to demonstrate knowledge of the importance of sleep to overall health, well-being, and quality of life. They  will understand the symptoms of, and treatments for, common sleep disorders. Patients will also be able to identify daytime and nighttime behaviors which impact sleep, and they will be able to apply these tools  to help manage sleep-related challenges. The purpose of this lesson is to provide patients with a general overview of sleep and outline the importance of quality sleep. Patients will learn about a few of the most common sleep disorders. Patients will also be introduced to the concept of "sleep hygiene," and discover ways to self-manage certain sleeping problems through simple daily behavior changes. Finally, the workshop will motivate patients by clarifying the links between quality sleep and their goals of heart-healthy living.   Recognizing and Reducing Stress Clinical staff led group instruction and group discussion with PowerPoint presentation and patient guidebook. To enhance the learning environment the use of posters, models and videos may be added. At the conclusion of this workshop, patients will be able to understand the types of stress reactions, differentiate between acute and chronic stress, and recognize the impact that chronic stress has on their health. They will also be able to apply different coping mechanisms, such as reframing negative self-talk. Patients will have the opportunity to practice a variety of stress management techniques, such as deep abdominal breathing, progressive muscle relaxation, and/or guided imagery.  The purpose of this lesson is to educate patients on the role of stress in their lives and to provide healthy techniques for coping with it.  Learning Barriers/Preferences:  Learning Barriers/Preferences - 06/02/22 1547       Learning Barriers/Preferences   Learning Barriers Inability to learn new things;Exercise Concerns;Hearing   Atreyu reports feeling lightheaded and cant remeber as he used to   [employer];Individual Instruction             Education Topics:  Knowledge Questionnaire Score:  Knowledge Questionnaire Score - 06/08/22 1432       Knowledge Questionnaire Score   Pre Score 19/24              Core Components/Risk Factors/Patient Goals at Admission:  Personal Goals and Risk Factors at Admission - 06/02/22 1629       Core Components/Risk Factors/Patient Goals on Admission    Weight Management Yes;Obesity;Weight Loss    Intervention Weight Management/Obesity: Establish reasonable short term and long term weight goals.;Obesity: Provide education and appropriate resources to help participant work on and attain dietary goals.    Admit Weight 265 lb 10.5 oz (120.5 kg)    Goal Weight: Short Term 260 lb (117.9 kg)    Goal Weight: Long Term 245 lb (111.1 kg)    Expected Outcomes Short Term: Continue to assess and modify interventions until short term weight is achieved;Long Term: Adherence to nutrition and physical activity/exercise program aimed toward attainment of established weight goal;Weight Loss: Understanding of general recommendations for a balanced deficit meal plan, which promotes 1-2 lb weight loss per week and includes a negative energy balance of [phone] kcal/d    Diabetes Yes    Intervention Provide education about signs/symptoms and action to take for hypo/hyperglycemia.;Provide education about proper nutrition, including hydration, and aerobic/resistive exercise prescription along with prescribed medications to achieve blood glucose in normal ranges: Fasting glucose 65-99 mg/dL    Expected Outcomes Short Term: Participant verbalizes understanding of the signs/symptoms and immediate care of hyper/hypoglycemia, proper foot care and importance of medication, aerobic/resistive exercise and nutrition plan for blood glucose control.;Long Term: Attainment of HbA1C < 7%.    Heart Failure  Yes    Intervention Provide a combined exercise and nutrition program that is supplemented with education, support and counseling about heart failure. Directed toward relieving symptoms such as shortness of breath, decreased exercise tolerance, and extremity edema.    Expected Outcomes Improve  functional capacity of life;Short term: Attendance in program 2-3 days a week with increased exercise capacity. Reported lower sodium intake. Reported increased fruit and vegetable intake. Reports medication compliance.;Short term: Daily weights obtained and reported for increase. Utilizing diuretic protocols set by physician.;Long term: Adoption of self-care skills and reduction of barriers for early signs and symptoms recognition and intervention leading to self-care maintenance.    Hypertension Yes    Intervention Provide education on lifestyle modifcations including regular physical activity/exercise, weight management, moderate sodium restriction and increased consumption of fresh fruit, vegetables, and low fat dairy, alcohol moderation, and smoking cessation.;Monitor prescription use compliance.    Expected Outcomes Short Term: Continued assessment and intervention until BP is < 140/72mm HG in hypertensive participants. < 130/67mm HG in hypertensive participants with diabetes, heart failure or chronic kidney disease.;Long Term: Maintenance of blood pressure at goal levels.    Lipids Yes    Intervention Provide education and support for participant on nutrition & aerobic/resistive exercise along with prescribed medications to achieve LDL 70mg , HDL >40mg .    Expected Outcomes Short Term: Participant states understanding of desired cholesterol values and is compliant with medications prescribed. Participant is following exercise prescription and nutrition guidelines.;Long Term: Cholesterol controlled with medications as prescribed, with individualized exercise RX and with personalized nutrition plan. Value goals: LDL < 70mg , HDL > 40 mg.    Stress Yes    Intervention Offer individual and/or small group education and counseling on adjustment to heart disease, stress management and health-related lifestyle change. Teach and support self-help strategies.;Refer participants experiencing significant  psychosocial distress to appropriate mental health specialists for further evaluation and treatment. When possible, include family members and significant others in education/counseling sessions.    Expected Outcomes Short Term: Participant demonstrates changes in health-related behavior, relaxation and other stress management skills, ability to obtain effective social support, and compliance with psychotropic medications if prescribed.;Long Term: Emotional wellbeing is indicated by absence of clinically significant psychosocial distress or social isolation.             Core Components/Risk Factors/Patient Goals Review:   Goals and Risk Factor Review     Row Name 06/10/22 0929 06/18/22 1447 07/15/22 1726         Core Components/Risk Factors/Patient Goals Review   Personal Goals Review Weight Management/Obesity;Hypertension;Lipids;Diabetes;Stress Weight Management/Obesity;Hypertension;Lipids;Diabetes;Stress Weight Management/Obesity;Hypertension;Lipids;Diabetes;Stress     Review Salif started cardiac rehab on 06/08/22. Darryon did well with exercise for his fitness level. CBG's and vital signs were stable. Edgard's hydralazine was decreased by Dr Jillyn Hidden will continue to monitor BP Ren started cardiac rehab on 06/08/22. Suresh is off to a good start to exercise. CBG's and vital signs have been stable. Clayburn feels and looks better since his medications have been adjusted Heberto has been doing well with exercise at intensive cardiac rehab. CBG's and vital signs have been stable. Crewe feels and looks better since his medications have been adjusted     Expected Outcomes Yuchen will continue to participate in phase 2 cardiac rehab for exercise, nutrition and lifestyle modifications Leno will continue to participate in intensive  rehab for exercise, nutrition and lifestyle modifications Audrick will continue to participate in intensive  rehab for exercise, nutrition and lifestyle modifications  Core  Components/Risk Factors/Patient Goals at Discharge (Final Review):   Goals and Risk Factor Review - 07/15/22 1726       Core Components/Risk Factors/Patient Goals Review   Personal Goals Review Weight Management/Obesity;Hypertension;Lipids;Diabetes;Stress    Review Valin has been doing well with exercise at intensive cardiac rehab. CBG's and vital signs have been stable. Niquan feels and looks better since his medications have been adjusted    Expected Outcomes Latasha will continue to participate in intensive  rehab for exercise, nutrition and lifestyle modifications             ITP Comments:  ITP Comments     Row Name 06/02/22 1304 06/02/22 1434 06/10/22 0924 06/18/22 1447 07/15/22 1721   ITP Comments Medical Director- Dr. Fransico Him, MD Dr Fransico Him MD, Medical Director. Introduction to [employer] program provided at [employer]. Initial resouce packet reviewed with the patient and his fiance. Patient took  Pritikin packet home to review. 30 Day ITP Review. [name] started cardiac rehab on 06/08/21. Kamarian did well with exercise for his fitness level 30 Day ITP Review. Jaquil is off to a good start to exercise. 30 Day ITP Review. Cylan has good attendance and participation in intensive cardiac rehab            Comments: See ITP comments.Harrell Gave RN BSN

## 2022-07-17 ENCOUNTER — Encounter (HOSPITAL_COMMUNITY)
Admission: RE | Admit: 2022-07-17 | Discharge: 2022-07-17 | Disposition: A | Discharge status: Home / Self Care | Payer: Medicare Other | Source: Ambulatory Visit | Attending: Interventional Cardiology | Admitting: Interventional Cardiology

## 2022-07-17 ENCOUNTER — Other Ambulatory Visit: Payer: Medicare Other

## 2022-07-17 DIAGNOSIS — R7989 Other specified abnormal findings of blood chemistry: Secondary | ICD-10-CM | POA: Diagnosis not present

## 2022-07-17 DIAGNOSIS — R946 Abnormal results of thyroid function studies: Secondary | ICD-10-CM | POA: Diagnosis not present

## 2022-07-17 DIAGNOSIS — I5022 Chronic systolic (congestive) heart failure: Secondary | ICD-10-CM | POA: Diagnosis not present

## 2022-07-17 DIAGNOSIS — Z48812 Encounter for surgical aftercare following surgery on the circulatory system: Secondary | ICD-10-CM | POA: Diagnosis not present

## 2022-07-17 DIAGNOSIS — Z955 Presence of coronary angioplasty implant and graft: Secondary | ICD-10-CM | POA: Diagnosis not present

## 2022-07-17 DIAGNOSIS — I208 Other forms of angina pectoris: Secondary | ICD-10-CM

## 2022-07-18 LAB — TSH: TSH: 5.52 u[IU]/mL — ABNORMAL HIGH (ref 0.450–4.500)

## 2022-07-20 ENCOUNTER — Encounter (HOSPITAL_COMMUNITY)
Admission: RE | Admit: 2022-07-20 | Discharge: 2022-07-20 | Disposition: A | Discharge status: Home / Self Care | Payer: Medicare Other | Source: Ambulatory Visit | Attending: Interventional Cardiology | Admitting: Interventional Cardiology

## 2022-07-20 DIAGNOSIS — I208 Other forms of angina pectoris: Secondary | ICD-10-CM

## 2022-07-20 DIAGNOSIS — Z48812 Encounter for surgical aftercare following surgery on the circulatory system: Secondary | ICD-10-CM | POA: Diagnosis not present

## 2022-07-20 DIAGNOSIS — Z955 Presence of coronary angioplasty implant and graft: Secondary | ICD-10-CM | POA: Diagnosis not present

## 2022-07-20 DIAGNOSIS — I5022 Chronic systolic (congestive) heart failure: Secondary | ICD-10-CM | POA: Diagnosis not present

## 2022-07-20 NOTE — Progress Notes (Signed)
Ross [name] reported that he feel on the toilet in the bathroom breaking the commode. Beyond a small bruise on his right forearm. No other injures noted. I spoke with Ross [name] and encouraged him to use a cane or walker . Ross [name] said he does not want to depend on using a cane or an assistive device. Ross [name] says that he is doing much better and feels stronger since participating in intensive cardiac rehab. Patient and wife given brochure for balance and vestibular rehab for gait training.Will continue to monitor the patient throughout  the [place] RN BSN

## 2022-07-22 ENCOUNTER — Encounter (HOSPITAL_COMMUNITY)
Admission: RE | Admit: 2022-07-22 | Discharge: 2022-07-22 | Disposition: A | Discharge status: Home / Self Care | Payer: Medicare Other | Source: Ambulatory Visit | Attending: Interventional Cardiology | Admitting: Interventional Cardiology

## 2022-07-22 DIAGNOSIS — I208 Other forms of angina pectoris: Secondary | ICD-10-CM | POA: Diagnosis not present

## 2022-07-22 DIAGNOSIS — E119 Type 2 diabetes mellitus without complications: Secondary | ICD-10-CM | POA: Insufficient documentation

## 2022-07-24 ENCOUNTER — Encounter (HOSPITAL_COMMUNITY)
Admission: RE | Admit: 2022-07-24 | Discharge: 2022-07-24 | Disposition: A | Discharge status: Home / Self Care | Payer: Medicare Other | Source: Ambulatory Visit | Attending: Interventional Cardiology | Admitting: Interventional Cardiology

## 2022-07-24 DIAGNOSIS — I208 Other forms of angina pectoris: Secondary | ICD-10-CM | POA: Diagnosis not present

## 2022-07-24 DIAGNOSIS — E119 Type 2 diabetes mellitus without complications: Secondary | ICD-10-CM | POA: Diagnosis not present

## 2022-07-27 ENCOUNTER — Encounter (HOSPITAL_COMMUNITY)
Admission: RE | Admit: 2022-07-27 | Discharge: 2022-07-27 | Disposition: A | Discharge status: Home / Self Care | Payer: Medicare Other | Source: Ambulatory Visit | Attending: Interventional Cardiology | Admitting: Interventional Cardiology

## 2022-07-27 VITALS — Ht 72.0 in | Wt 260.1 lb

## 2022-07-27 DIAGNOSIS — I208 Other forms of angina pectoris: Secondary | ICD-10-CM | POA: Diagnosis not present

## 2022-07-27 DIAGNOSIS — E119 Type 2 diabetes mellitus without complications: Secondary | ICD-10-CM | POA: Diagnosis not present

## 2022-07-29 ENCOUNTER — Encounter (HOSPITAL_COMMUNITY)
Admission: RE | Admit: 2022-07-29 | Discharge: 2022-07-29 | Disposition: A | Discharge status: Home / Self Care | Payer: Medicare Other | Source: Ambulatory Visit | Attending: Interventional Cardiology | Admitting: Interventional Cardiology

## 2022-07-29 DIAGNOSIS — I208 Other forms of angina pectoris: Secondary | ICD-10-CM | POA: Diagnosis not present

## 2022-07-29 DIAGNOSIS — E119 Type 2 diabetes mellitus without complications: Secondary | ICD-10-CM | POA: Diagnosis not present

## 2022-07-31 ENCOUNTER — Encounter (HOSPITAL_COMMUNITY)
Admission: RE | Admit: 2022-07-31 | Discharge: 2022-07-31 | Disposition: A | Discharge status: Home / Self Care | Payer: Medicare Other | Source: Ambulatory Visit | Attending: Interventional Cardiology | Admitting: Interventional Cardiology

## 2022-07-31 DIAGNOSIS — I208 Other forms of angina pectoris: Secondary | ICD-10-CM

## 2022-07-31 DIAGNOSIS — E119 Type 2 diabetes mellitus without complications: Secondary | ICD-10-CM | POA: Diagnosis not present

## 2022-07-31 LAB — GLUCOSE, CAPILLARY: Glucose-Capillary: 111 mg/dL — ABNORMAL HIGH (ref 70–99)

## 2022-08-03 ENCOUNTER — Encounter (HOSPITAL_COMMUNITY): Payer: Medicare Other

## 2022-08-10 ENCOUNTER — Other Ambulatory Visit (HOSPITAL_COMMUNITY): Payer: Self-pay

## 2022-08-10 MED ORDER — ALPRAZOLAM 0.5 MG PO TABS
0.5000 mg | ORAL_TABLET | Freq: Every day | ORAL | 0 refills | Status: DC
  Filled 2022-08-12: qty 62, 31d supply, fill #0

## 2022-08-11 NOTE — Progress Notes (Signed)
Discharge Progress Report  Patient Details  Name: Ross [name] MRN: [id number] Date of Birth: [birth year] Referring Provider:   Flowsheet Row INTENSIVE CARDIAC REHAB ORIENT from 06/02/2022 in [place]  Referring Provider Belva Crome, MD.        Number of Visits: 24  Reason for Discharge:  Patient reached a stable level of exercise. Patient independent in their exercise. Patient has met program and personal goals.  Smoking History:  Social History   Tobacco Use  Smoking Status Former   Packs/day: 1.50   Years: 45.00   Total pack years: 67.50   Types: Cigarettes   Quit date: 10/2020   Years since quitting: 1.8  Smokeless Tobacco Never    Diagnosis:  Chronic stable angina ([place])  ADL [facility]:   Initial Exercise Prescription:  Initial Exercise Prescription - 06/02/22 1600       Date of Initial Exercise RX and Referring Provider   Date 06/02/22    Referring Provider Belva Crome, MD.    Expected Discharge Date 07/31/22      T5 Nustep   Level 1    SPM 85    Minutes 25    METs 2.2      Prescription Details   Frequency (times per week) 3    Duration Progress to 30 minutes of continuous aerobic without signs/symptoms of physical distress      Intensity   THRR 40-80% of Max Heartrate 61-122    Ratings of Perceived Exertion 11-13    Perceived Dyspnea 0-4      Progression   Progression Continue to progress workloads to maintain intensity without signs/symptoms of physical distress.      Resistance Training   Training Prescription Yes    Weight 2 lbs    Reps 10-15             Discharge Exercise Prescription (Final Exercise Prescription Changes):  Exercise Prescription Changes - 08/03/22 1637       Response to Exercise   Blood Pressure (Admit) --    Blood Pressure (Exercise) --    Blood Pressure (Exit) --    Heart Rate (Admit) --    Heart Rate (Exercise) --    Heart Rate (Exit) --    Rating of Perceived  Exertion (Exercise) --    Perceived Dyspnea (Exercise) --    Symptoms --    Comments --    Duration --    Intensity --      Progression   Progression --    Average METs --      Resistance Training   Training Prescription --    Weight --    Reps --    Time --      T5 Nustep   Level --    SPM --    Minutes --    METs --      Home Exercise Plan   Plans to continue exercise at --    Frequency --    Initial Home Exercises Provided --             Functional Capacity:  6 Minute Walk     Row Name 06/02/22 1620 07/24/22 1621       6 Minute Walk   Phase Initial Discharge    Distance 0.39 feet  miles 2986 feet  0.91km 0.18miles 188spm 62avg watt 3.0avg met total steps 1131    Distance % Change -- 46.8 %    Distance Feet Change --  952 ft    Walk Time 6 minutes 6 minutes    # of Rest Breaks 3 0    MPH 3.85 5.66    METS 3.2 5.9    RPE 14 14    Perceived Dyspnea  2.5 0    Symptoms Yes (comment) No    Comments Bilateral hip pain, 8/10, generalized, overall pain, mild to moderate SOB, RPD=2.5 --    Resting HR 85 bpm 84 bpm    Resting BP 98/64 104/64    Resting Oxygen Saturation  95 % 95 %    Exercise Oxygen Saturation  during 6 min walk 99 % 96 %    Max Ex. HR 87 bpm 120 bpm    Max Ex. BP 108/50 148/70    2 Minute Post BP 118/62 116/62  End of exercise             Psychological, QOL, Others - Outcomes: PHQ 2/9:    06/02/2022    3:50 PM  Depression screen PHQ 2/9  Decreased Interest 0  Down, Depressed, Hopeless 0  PHQ - 2 Score 0    Quality of Life:  Quality of Life - 07/31/22 1707       Quality of Life   Select Quality of Life      Quality of Life Scores   Health/Function Post 9.37 %    Socioeconomic Post 17.71 %    Psych/Spiritual Post 22 %    Family Post 20.9 %    GLOBAL Post 15.18 %             Personal Goals: Goals established at orientation with interventions provided to work toward goal.  Personal Goals and Risk Factors at  Admission - 06/02/22 1629       Core Components/Risk Factors/Patient Goals on Admission    Weight Management Yes;Obesity;Weight Loss    Intervention Weight Management/Obesity: Establish reasonable short term and long term weight goals.;Obesity: Provide education and appropriate resources to help participant work on and attain dietary goals.    Admit Weight 265 lb 10.5 oz (120.5 kg)    Goal Weight: Short Term 260 lb (117.9 kg)    Goal Weight: Long Term 245 lb (111.1 kg)    Expected Outcomes Short Term: Continue to assess and modify interventions until short term weight is achieved;Long Term: Adherence to nutrition and physical activity/exercise program aimed toward attainment of established weight goal;Weight Loss: Understanding of general recommendations for a balanced deficit meal plan, which promotes 1-2 lb weight loss per week and includes a negative energy balance of [phone] kcal/d    Diabetes Yes    Intervention Provide education about signs/symptoms and action to take for hypo/hyperglycemia.;Provide education about proper nutrition, including hydration, and aerobic/resistive exercise prescription along with prescribed medications to achieve blood glucose in normal ranges: Fasting glucose 65-99 mg/dL    Expected Outcomes Short Term: Participant verbalizes understanding of the signs/symptoms and immediate care of hyper/hypoglycemia, proper foot care and importance of medication, aerobic/resistive exercise and nutrition plan for blood glucose control.;Long Term: Attainment of HbA1C < 7%.    Heart Failure Yes    Intervention Provide a combined exercise and nutrition program that is supplemented with education, support and counseling about heart failure. Directed toward relieving symptoms such as shortness of breath, decreased exercise tolerance, and extremity edema.    Expected Outcomes Improve functional capacity of life;Short term: Attendance in program 2-3 days a week with increased exercise  capacity. Reported lower sodium intake. Reported increased fruit and  vegetable intake. Reports medication compliance.;Short term: Daily weights obtained and reported for increase. Utilizing diuretic protocols set by physician.;Long term: Adoption of self-care skills and reduction of barriers for early signs and symptoms recognition and intervention leading to self-care maintenance.    Hypertension Yes    Intervention Provide education on lifestyle modifcations including regular physical activity/exercise, weight management, moderate sodium restriction and increased consumption of fresh fruit, vegetables, and low fat dairy, alcohol moderation, and smoking cessation.;Monitor prescription use compliance.    Expected Outcomes Short Term: Continued assessment and intervention until BP is < 140/55mm HG in hypertensive participants. < 130/28mm HG in hypertensive participants with diabetes, heart failure or chronic kidney disease.;Long Term: Maintenance of blood pressure at goal levels.    Lipids Yes    Intervention Provide education and support for participant on nutrition & aerobic/resistive exercise along with prescribed medications to achieve LDL '70mg'$ , HDL >$Remo'40mg'iVhmg$ .    Expected Outcomes Short Term: Participant states understanding of desired cholesterol values and is compliant with medications prescribed. Participant is following exercise prescription and nutrition guidelines.;Long Term: Cholesterol controlled with medications as prescribed, with individualized exercise RX and with personalized nutrition plan. Value goals: LDL < $Rem'70mg'icYj$ , HDL > 40 mg.    Stress Yes    Intervention Offer individual and/or small group education and counseling on adjustment to heart disease, stress management and health-related lifestyle change. Teach and support self-help strategies.;Refer participants experiencing significant psychosocial distress to appropriate mental health specialists for further evaluation and treatment. When  possible, include family members and significant others in education/counseling sessions.    Expected Outcomes Short Term: Participant demonstrates changes in health-related behavior, relaxation and other stress management skills, ability to obtain effective social support, and compliance with psychotropic medications if prescribed.;Long Term: Emotional wellbeing is indicated by absence of clinically significant psychosocial distress or social isolation.              Personal Goals Discharge:  Goals and Risk Factor Review     Row Name 06/10/22 6060 06/18/22 1447 07/15/22 1726 08/11/22 1509       Core Components/Risk Factors/Patient Goals Review   Personal Goals Review Weight Management/Obesity;Hypertension;Lipids;Diabetes;Stress Weight Management/Obesity;Hypertension;Lipids;Diabetes;Stress Weight Management/Obesity;Hypertension;Lipids;Diabetes;Stress Weight Management/Obesity;Hypertension;Lipids;Diabetes;Stress    Review Delroy started cardiac rehab on 06/08/22. Rayane did well with exercise for his fitness level. CBG's and vital signs were stable. Korie's hydralazine was decreased by Dr Tamala Julian will continue to monitor BP Carlyle started cardiac rehab on 06/08/22. Travas is off to a good start to exercise. CBG's and vital signs have been stable. Chasen feels and looks better since his medications have been adjusted Mikeal has been doing well with exercise at intensive cardiac rehab. CBG's and vital signs have been stable. Holden feels and looks better since his medications have been adjusted Gerrett has been doing well with exercise at intensive cardiac rehab. CBG's and vital signs have been stable. Alba completed Cardiac rehab on 07/31/2022.    Expected Outcomes Brayan will continue to participate in phase 2 cardiac rehab for exercise, nutrition and lifestyle modifications Tomasz will continue to participate in intensive  rehab for exercise, nutrition and lifestyle modifications Kyre will continue to participate in  intensive  rehab for exercise, nutrition and lifestyle modifications Britian will continue to participate in intensive  rehab for exercise, nutrition and lifestyle modifications             Exercise Goals and Review:  Exercise Goals     Row Name 06/02/22 1317  Exercise Goals   Increase Physical Activity Yes       Intervention Provide advice, education, support and counseling about physical activity/exercise needs.;Develop an individualized exercise prescription for aerobic and resistive training based on initial evaluation findings, risk stratification, comorbidities and participant's personal goals.       Expected Outcomes Short Term: Attend rehab on a regular basis to increase amount of physical activity.;Long Term: Exercising regularly at least 3-5 days a week.;Long Term: Add in home exercise to make exercise part of routine and to increase amount of physical activity.       Increase Strength and Stamina Yes       Intervention Provide advice, education, support and counseling about physical activity/exercise needs.;Develop an individualized exercise prescription for aerobic and resistive training based on initial evaluation findings, risk stratification, comorbidities and participant's personal goals.       Expected Outcomes Short Term: Increase workloads from initial exercise prescription for resistance, speed, and METs.;Short Term: Perform resistance training exercises routinely during rehab and add in resistance training at home;Long Term: Improve cardiorespiratory fitness, muscular endurance and strength as measured by increased METs and functional capacity (6MWT)       Able to understand and use rate of perceived exertion (RPE) scale Yes       Intervention Provide education and explanation on how to use RPE scale       Expected Outcomes Short Term: Able to use RPE daily in rehab to express subjective intensity level;Long Term:  Able to use RPE to guide intensity level when  exercising independently       Knowledge and understanding of Target Heart Rate Range (THRR) Yes       Intervention Provide education and explanation of THRR including how the numbers were predicted and where they are located for reference       Expected Outcomes Short Term: Able to state/look up THRR;Long Term: Able to use THRR to govern intensity when exercising independently;Short Term: Able to use daily as guideline for intensity in rehab       Understanding of Exercise Prescription Yes       Intervention Provide education, explanation, and written materials on patient's individual exercise prescription       Expected Outcomes Short Term: Able to explain program exercise prescription;Long Term: Able to explain home exercise prescription to exercise independently                Exercise Goals Re-Evaluation:  Exercise Goals Re-Evaluation     Row Name 06/08/22 1635 06/24/22 1645 07/24/22 1643 08/03/22 1639       Exercise Goal Re-Evaluation   Exercise Goals Review Increase Physical Activity;Increase Strength and Stamina;Able to understand and use rate of perceived exertion (RPE) scale;Knowledge and understanding of Target Heart Rate Range (THRR);Understanding of Exercise Prescription Increase Physical Activity;Increase Strength and Stamina;Able to understand and use rate of perceived exertion (RPE) scale;Knowledge and understanding of Target Heart Rate Range (THRR);Understanding of Exercise Prescription Increase Physical Activity;Increase Strength and Stamina;Able to understand and use rate of perceived exertion (RPE) scale;Knowledge and understanding of Target Heart Rate Range (THRR);Understanding of Exercise Prescription Increase Physical Activity;Increase Strength and Stamina;Able to understand and use rate of perceived exertion (RPE) scale;Knowledge and understanding of Target Heart Rate Range (THRR);Understanding of Exercise Prescription    Comments pt first day in the [facility] program. Pt is  tolerating low intensity exercise well with some balance concerns and an average MET level of 2.0. Pt is learning his RPE, THRR and ExRx Reviewed MET's,  goals and home ExRx. Pt is tolerating exercise well with some balance concerns and an average MET level of 2.0. Pt feels good about his goals of gaining strength but is continueing to work on his wt loss and motivation. Encourage pt that he has good attendance and is doing well. Pt will continue to exercise on his own by doing chair fitness classes on youtube at home due to his balance concerns for 1-2 days for 30 minutes per session Reviewed MET's and goals and post 6 min stepper test. Pt is tolerating exercise well with an average MET level of 2.4. Pt is making good progress. He increased his SPM on his walk test from orientation, and is feeling better with his exercise, Pt feels good about his goals so far and is losing some wt. Overall pt is doing well, talked breifly about [facility] program after discharge. Pt is open, will talk more at graduation next week. Pt last day in the [facility] program. Pt was invited to one more make-up session 08/03/22 but did not return, last day on 07/31/22. Pt tolerated  exercise well with an average MET level of 2.7. Pt did great in the program, nurse refered pt to balance training and sent referal to physician.    Expected Outcomes Will continue to monitor pt and progress workloads as tolerated without sign or symptom Pt will exercise on his own. Will continue to monitor pt and progress workloads as tolerated without sign or symptom. Will continue to monitor pt and progress workloads as tolerated without sign or symptom. Will continue to monitor pt and progress workloads as tolerated without sign or symptom.             Nutrition & Weight - Outcomes:  Pre Biometrics - 06/02/22 1428       Pre Biometrics   Waist Circumference 49 inches    Hip Circumference 49.75 inches    Waist to Hip Ratio 0.98 %    Triceps Skinfold 26 mm     % Body Fat 36.6 %    Grip Strength 32 kg    Flexibility --   Not performed, c/o bilateral hip pain.            Post Biometrics - 07/27/22 1659        Post  Biometrics   Height 6' (1.829 m)    Weight 118 kg    Waist Circumference 53 inches    Hip Circumference 46.5 inches    Waist to Hip Ratio 1.14 %    BMI (Calculated) 35.27    Triceps Skinfold 12 mm    % Body Fat 35.1 %    Grip Strength 12 kg    Flexibility --   Did not do due to poor balance   Single Leg Stand --   did not do due to poor balance            Nutrition:  Nutrition Therapy & Goals - 07/27/22 1620       Nutrition Therapy   Diet Heart Healthy Diet    Drug/Food Interactions Statins/Certain Fruits      Personal Nutrition Goals   Nutrition Goal Patient to choose a variety of fruits, vegetables, whole grains, lean protein/plant protein, and nonfat dairy daily as part of a heart healthy diet    Personal Goal #2 Patient to limit to '1500mg'$  sodium per day    Personal Goal #3 Patient to identify and limit daily food sources of sodium, trans fats, saturated fat, and refined  carbohydrates    Comments Goals in progress. Oryon and his girlfriend continue to attend the [employer] education classes. Girlfriend reports improvement in his mood since starting with our program. He continues to make some dietary changes though does continue to snack on refined carbohydrates/sweets reguarly.      Intervention Plan   Intervention Prescribe, educate and counsel regarding individualized specific dietary modifications aiming towards targeted core components such as weight, hypertension, lipid management, diabetes, heart failure and other comorbidities.    Expected Outcomes Short Term Goal: Understand basic principles of dietary content, such as calories, fat, sodium, cholesterol and nutrients.;Long Term Goal: Adherence to prescribed nutrition plan.             Nutrition Discharge:  Nutrition Assessments - 08/04/22 1557        Rate Your Plate Scores   Post Score 52             Education Questionnaire Score:  Knowledge Questionnaire Score - 07/31/22 1702       Knowledge Questionnaire Score   Post Score 22/24             Goals reviewed with patient; copy given to patient.  Pt graduated from Intensive cardiac rehab program 07/31/2022 with completion of 24 exercise and education sessions. Dominica Severin maintained good attendance and progressed nicely during their participation in rehab as evidenced by increased MET level and a weight decrease of 2 kg. Rommel's 6 minute test was performed on the nustep and increased by 622 ft   Medication list reconciled. Repeat  PHQ score- 0 . Pt has made significant lifestyle changes and should be commended for his success. We are very proud of him and will miss his hard work during class. Olander achieved his goals during cardiac rehab. Patient was referred to balance training by physician.   Albertine Grates RN 8/22/20233:59 PM

## 2022-08-12 ENCOUNTER — Other Ambulatory Visit (HOSPITAL_COMMUNITY): Payer: Self-pay

## 2022-08-13 ENCOUNTER — Other Ambulatory Visit (HOSPITAL_COMMUNITY): Payer: Self-pay

## 2022-08-19 ENCOUNTER — Ambulatory Visit (HOSPITAL_BASED_OUTPATIENT_CLINIC_OR_DEPARTMENT_OTHER): Payer: Medicare Other | Attending: Interventional Cardiology | Admitting: Cardiovascular Disease

## 2022-08-19 DIAGNOSIS — E669 Obesity, unspecified: Secondary | ICD-10-CM | POA: Diagnosis not present

## 2022-08-19 DIAGNOSIS — I25709 Atherosclerosis of coronary artery bypass graft(s), unspecified, with unspecified angina pectoris: Secondary | ICD-10-CM | POA: Diagnosis not present

## 2022-08-19 DIAGNOSIS — G4736 Sleep related hypoventilation in conditions classified elsewhere: Secondary | ICD-10-CM | POA: Insufficient documentation

## 2022-08-19 DIAGNOSIS — G4733 Obstructive sleep apnea (adult) (pediatric): Secondary | ICD-10-CM | POA: Diagnosis not present

## 2022-08-19 DIAGNOSIS — I110 Hypertensive heart disease with heart failure: Secondary | ICD-10-CM | POA: Insufficient documentation

## 2022-08-19 DIAGNOSIS — I4819 Other persistent atrial fibrillation: Secondary | ICD-10-CM | POA: Insufficient documentation

## 2022-08-19 DIAGNOSIS — Z6835 Body mass index (BMI) 35.0-35.9, adult: Secondary | ICD-10-CM | POA: Diagnosis not present

## 2022-08-19 DIAGNOSIS — I469 Cardiac arrest, cause unspecified: Secondary | ICD-10-CM | POA: Insufficient documentation

## 2022-08-19 DIAGNOSIS — I5022 Chronic systolic (congestive) heart failure: Secondary | ICD-10-CM | POA: Diagnosis not present

## 2022-08-19 DIAGNOSIS — I472 Ventricular tachycardia, unspecified: Secondary | ICD-10-CM | POA: Insufficient documentation

## 2022-08-19 DIAGNOSIS — Z950 Presence of cardiac pacemaker: Secondary | ICD-10-CM | POA: Diagnosis not present

## 2022-08-20 ENCOUNTER — Other Ambulatory Visit (HOSPITAL_COMMUNITY): Payer: Self-pay

## 2022-09-02 ENCOUNTER — Other Ambulatory Visit (HOSPITAL_COMMUNITY): Payer: Self-pay

## 2022-09-02 ENCOUNTER — Ambulatory Visit (INDEPENDENT_AMBULATORY_CARE_PROVIDER_SITE_OTHER): Payer: Medicare Other

## 2022-09-02 DIAGNOSIS — I255 Ischemic cardiomyopathy: Secondary | ICD-10-CM

## 2022-09-03 LAB — CUP PACEART REMOTE DEVICE CHECK
Battery Remaining Longevity: 104 mo
Battery Voltage: 3.04 V
Brady Statistic AP VP Percent: 0 %
Brady Statistic AP VS Percent: 0 %
Brady Statistic AS VP Percent: 0 %
Brady Statistic AS VS Percent: 0 %
Brady Statistic RA Percent Paced: 0 %
Brady Statistic RV Percent Paced: 99.53 %
Date Time Interrogation Session: 20230913082829
HighPow Impedance: 76 Ohm
Implantable Lead Implant Date: 20230314
Implantable Lead Implant Date: 20230314
Implantable Lead Location: 753860
Implantable Lead Location: 753860
Implantable Lead Model: 3830
Implantable Pulse Generator Implant Date: 20230314
Lead Channel Impedance Value: 247 Ohm
Lead Channel Impedance Value: 304 Ohm
Lead Channel Impedance Value: 342 Ohm
Lead Channel Impedance Value: 399 Ohm
Lead Channel Impedance Value: 4047 Ohm
Lead Channel Impedance Value: 532 Ohm
Lead Channel Pacing Threshold Amplitude: 0.375 V
Lead Channel Pacing Threshold Amplitude: 0.625 V
Lead Channel Pacing Threshold Pulse Width: 0.4 ms
Lead Channel Pacing Threshold Pulse Width: 0.4 ms
Lead Channel Sensing Intrinsic Amplitude: 6 mV
Lead Channel Sensing Intrinsic Amplitude: 8.625 mV
Lead Channel Setting Pacing Amplitude: 2.5 V
Lead Channel Setting Pacing Pulse Width: 0.4 ms
Lead Channel Setting Sensing Sensitivity: 0.3 mV

## 2022-09-07 ENCOUNTER — Telehealth: Payer: Self-pay | Admitting: *Deleted

## 2022-09-07 ENCOUNTER — Encounter (HOSPITAL_BASED_OUTPATIENT_CLINIC_OR_DEPARTMENT_OTHER): Payer: Self-pay | Admitting: Cardiovascular Disease

## 2022-09-07 ENCOUNTER — Other Ambulatory Visit: Payer: Self-pay | Admitting: Cardiovascular Disease

## 2022-09-07 DIAGNOSIS — G4736 Sleep related hypoventilation in conditions classified elsewhere: Secondary | ICD-10-CM

## 2022-09-07 DIAGNOSIS — G4733 Obstructive sleep apnea (adult) (pediatric): Secondary | ICD-10-CM

## 2022-09-07 NOTE — Procedures (Signed)
       Patient Name: Ross [name], Ross [name] Date: 08/20/2022 Gender: Male D.O.B: 07-18-1956 Age (years): 66 Referring Provider: Daneen Schick Height (inches): 60 Interpreting Physician: Shelva Majestic MD, [facility] Weight (lbs): 268 RPSGT: Gerhard Perches BMI: 35 MRN: [id number] Neck Size: 18.50  CLINICAL INFORMATION Sleep Study Type: HST  Indication for sleep study: Atrial fibrillation, cardiomyopathy, obesity, CAD, HTN  Epworth Sleepiness Score: 6  SLEEP STUDY TECHNIQUE A multi-channel overnight portable sleep study was performed. The channels recorded were: nasal airflow, thoracic respiratory movement, and oxygen saturation with a pulse oximetry. Snoring was also monitored.  MEDICATIONS ALPRAZolam (XANAX) 0.5 MG tablet amiodarone (PACERONE) 200 MG tablet apixaban (ELIQUIS) 5 MG TABS tablet aspirin 81 MG chewable tablet atorvastatin (LIPITOR) 80 MG tablet budesonide-formoterol (SYMBICORT) 160-4.5 MCG/ACT inhaler carvedilol (COREG) 6.25 MG tablet dapagliflozin propanediol (FARXIGA) 10 MG TABS tablet furosemide (LASIX) 40 MG tablet gabapentin (NEURONTIN) 300 MG capsule gabapentin (NEURONTIN) 300 MG capsule hydrALAZINE (APRESOLINE) 25 MG tablet isosorbide dinitrate (ISORDIL) 20 MG tablet linaclotide (LINZESS) 145 MCG CAPS capsule pantoprazole (PROTONIX) 40 MG tablet polyethylene glycol powder (GLYCOLAX/MIRALAX) 17 GM/SCOOP powder senna (SENOKOT) 8.6 MG TABS tablet Patient self administered medications include: N/A.  SLEEP ARCHITECTURE Patient was studied for 380 minutes. The sleep efficiency was 100.0 % and the patient was supine for 0%. The arousal index was 0.0 per hour.  RESPIRATORY PARAMETERS The overall AHI was 14.2 per hour, with a central apnea index of 0 per hour.  The oxygen nadir was 81% during sleep.  CARDIAC DATA Mean heart rate during sleep was 60.4 bpm. Heart rate range was 58 - 76 bpm.  IMPRESSIONS - Mild obstructive sleep apnea occurred during this  study (AHI 14.2/h); however, sleep apnea was moderate with supine sleep (AHI 21.3/h) - Moderate oxygen desaturation to a nadir of 81%. Time spent < 89% was 23 minutes. - Patient snored for 25.2 minutes (6.6%) during the sleep.  DIAGNOSIS - Obstructive Sleep Apnea (G47.33) - Nocturnal Hypoxemia (G47.36)  RECOMMENDATIONS - In this patient with significant cardiovascular co-morbidities recommend therapeutic CPAP titration to determine optimal pressure required to alleviate sleep disordered breathing. If unable to obtain an in-lab titration, initiate Auto-PAP with EPR of 3 at 7 - 16 cm of water. - Effort should be made to optimize nasal and oropharyngeal patency. - Avoid alcohol, sedatives and other CNS depressants that may worsen sleep apnea and disrupt normal sleep architecture. - Sleep hygiene should be reviewed to assess factors that may improve sleep quality. - Weight management (BMI 35) and regular exercise should be initiated or continued. - Recommend a download and sleep clinic evaluation after 4 weeks of therapy.   [Electronically signed] 09/07/2022 01:38 PM  Shelva Majestic MD, [facility], [place], American Board of Sleep Medicine  NPI: [id number]  [place] PH: [phone]   FX: [phone] [place]

## 2022-09-07 NOTE — Telephone Encounter (Signed)
-----   Message from Troy Sine, MD sent at 09/07/2022  1:44 PM EDT ----- [name], please notify pt of results; try for in-lab titration, if unable then Auto-PAP

## 2022-09-07 NOTE — Telephone Encounter (Signed)
Patient notified of sleep study results and recommendations. After some pushback he agrees to proceed with having CPAP titration.

## 2022-09-08 ENCOUNTER — Other Ambulatory Visit (HOSPITAL_COMMUNITY): Payer: Self-pay

## 2022-09-08 DIAGNOSIS — I4891 Unspecified atrial fibrillation: Secondary | ICD-10-CM | POA: Diagnosis not present

## 2022-09-08 DIAGNOSIS — E213 Hyperparathyroidism, unspecified: Secondary | ICD-10-CM | POA: Diagnosis not present

## 2022-09-08 DIAGNOSIS — I10 Essential (primary) hypertension: Secondary | ICD-10-CM | POA: Diagnosis not present

## 2022-09-08 DIAGNOSIS — J449 Chronic obstructive pulmonary disease, unspecified: Secondary | ICD-10-CM | POA: Diagnosis not present

## 2022-09-08 DIAGNOSIS — M170 Bilateral primary osteoarthritis of knee: Secondary | ICD-10-CM | POA: Diagnosis not present

## 2022-09-08 DIAGNOSIS — R7989 Other specified abnormal findings of blood chemistry: Secondary | ICD-10-CM | POA: Diagnosis not present

## 2022-09-08 DIAGNOSIS — G629 Polyneuropathy, unspecified: Secondary | ICD-10-CM | POA: Diagnosis not present

## 2022-09-08 DIAGNOSIS — E1159 Type 2 diabetes mellitus with other circulatory complications: Secondary | ICD-10-CM | POA: Diagnosis not present

## 2022-09-08 DIAGNOSIS — D6869 Other thrombophilia: Secondary | ICD-10-CM | POA: Diagnosis not present

## 2022-09-08 DIAGNOSIS — I2510 Atherosclerotic heart disease of native coronary artery without angina pectoris: Secondary | ICD-10-CM | POA: Diagnosis not present

## 2022-09-08 DIAGNOSIS — I700 Atherosclerosis of aorta: Secondary | ICD-10-CM | POA: Diagnosis not present

## 2022-09-08 DIAGNOSIS — R946 Abnormal results of thyroid function studies: Secondary | ICD-10-CM | POA: Diagnosis not present

## 2022-09-08 MED ORDER — ALPRAZOLAM 0.5 MG PO TABS
0.5000 mg | ORAL_TABLET | Freq: Two times a day (BID) | ORAL | 5 refills | Status: DC
  Filled 2022-09-09: qty 90, 30d supply, fill #0
  Filled 2022-10-08: qty 90, 30d supply, fill #1
  Filled 2022-11-09: qty 90, 30d supply, fill #2
  Filled 2022-12-07: qty 90, 30d supply, fill #3
  Filled 2023-01-11: qty 90, 30d supply, fill #4
  Filled 2023-02-07 – 2023-02-12 (×2): qty 90, 30d supply, fill #5

## 2022-09-09 ENCOUNTER — Other Ambulatory Visit (HOSPITAL_COMMUNITY): Payer: Self-pay

## 2022-09-12 IMAGING — DX DG CHEST 2V
2 series · 2 of 2 positions shown · non-contrast
Comparison: 06/01/2021

CLINICAL DATA: Pacemaker

EXAM:
CHEST - 2 VIEW

[w chest lat]
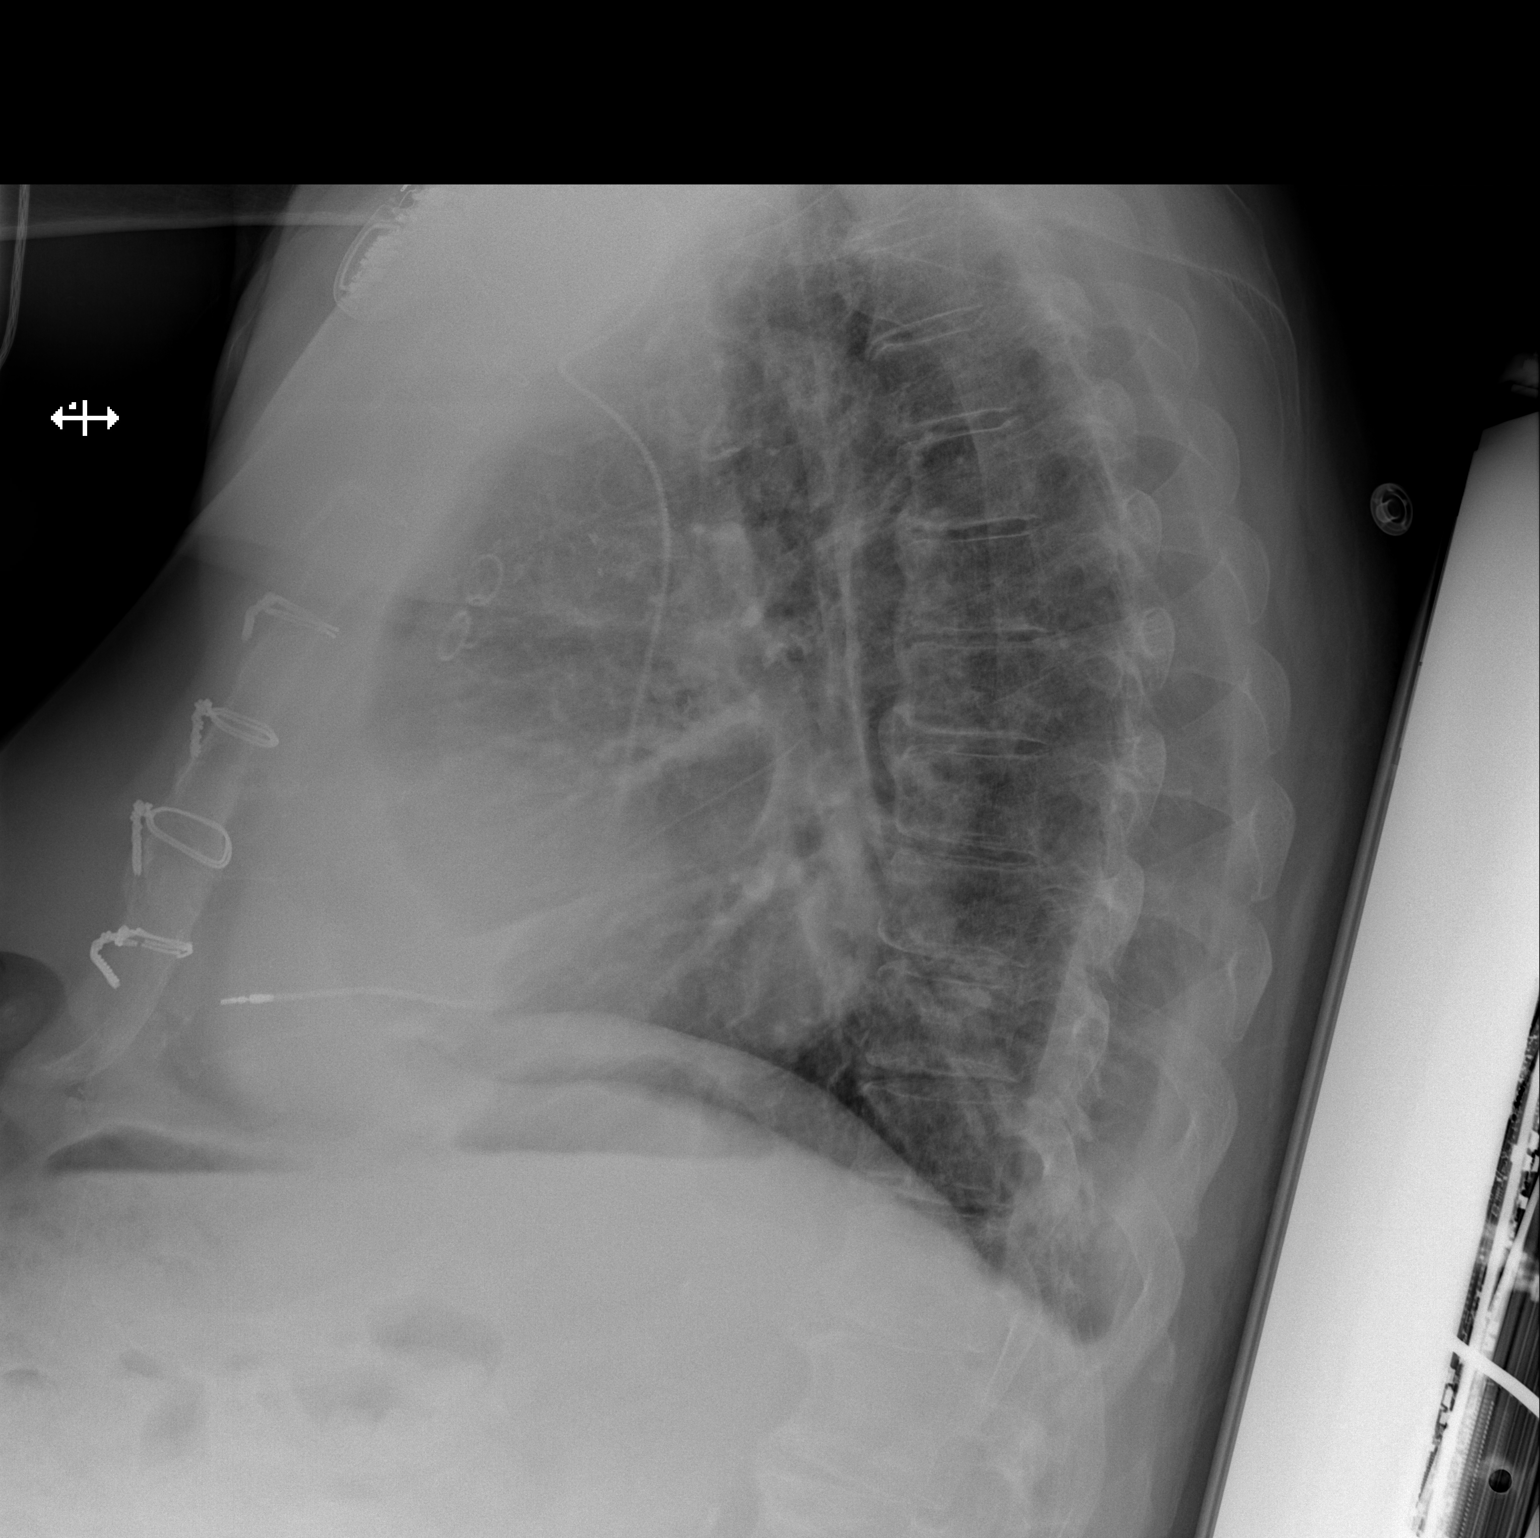

[x chest ap]
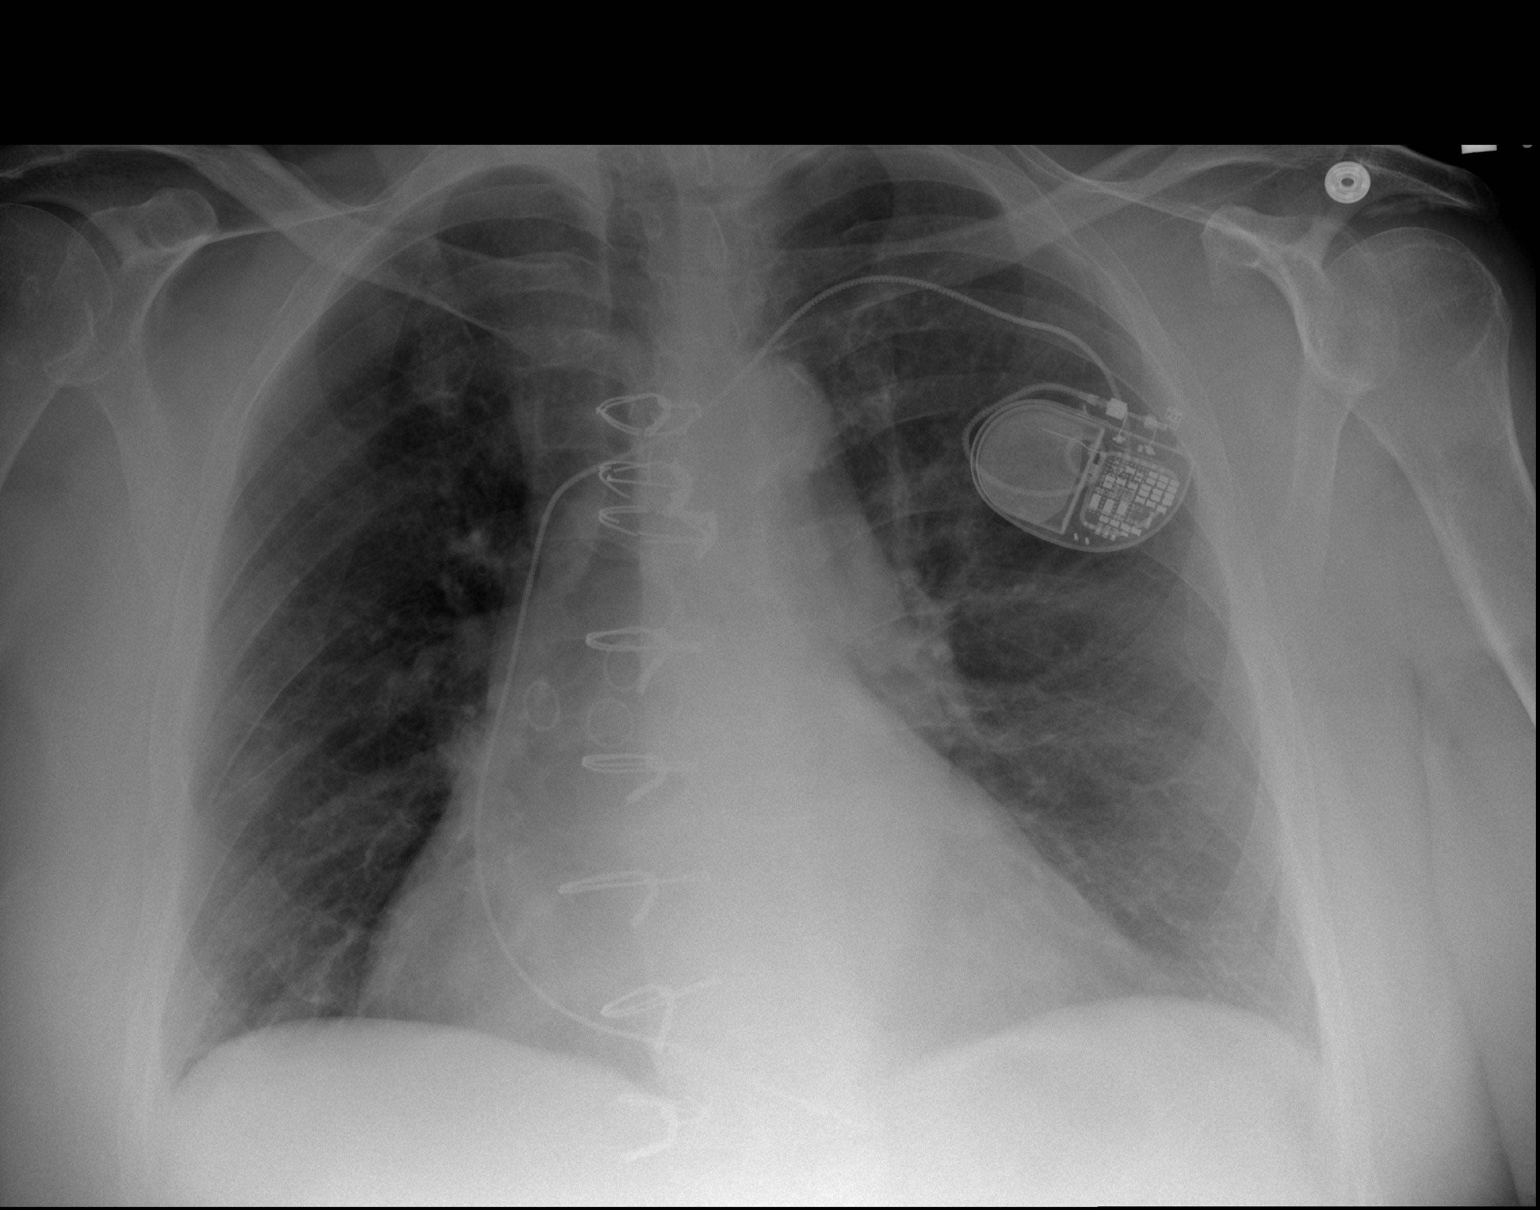

[2 of 2 positions shown; findings below may reference images not displayed]

FINDINGS: Post sternotomy changes. Left-sided single lead pacing device with
tip projecting over right ventricle. Cardiomegaly. No acute airspace
disease, pleural effusion, or pneumothorax. Aortic atherosclerosis.
IMPRESSION: 1. Left-sided pacing device with tip over right ventricle. Negative
for pneumothorax
2. Cardiomegaly

## 2022-09-14 ENCOUNTER — Other Ambulatory Visit (HOSPITAL_COMMUNITY): Payer: Self-pay

## 2022-09-17 ENCOUNTER — Other Ambulatory Visit (HOSPITAL_COMMUNITY): Payer: Self-pay

## 2022-09-17 NOTE — Progress Notes (Signed)
Remote ICD transmission.   
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
## 2022-09-21 ENCOUNTER — Ambulatory Visit: Payer: Medicare Other | Attending: Interventional Cardiology | Admitting: Interventional Cardiology

## 2022-09-21 ENCOUNTER — Encounter: Payer: Self-pay | Admitting: Interventional Cardiology

## 2022-09-21 VITALS — BP 108/64 | HR 63 | Ht 74.0 in | Wt 260.4 lb

## 2022-09-21 DIAGNOSIS — I25709 Atherosclerosis of coronary artery bypass graft(s), unspecified, with unspecified angina pectoris: Secondary | ICD-10-CM | POA: Diagnosis not present

## 2022-09-21 DIAGNOSIS — G4736 Sleep related hypoventilation in conditions classified elsewhere: Secondary | ICD-10-CM

## 2022-09-21 DIAGNOSIS — I472 Ventricular tachycardia, unspecified: Secondary | ICD-10-CM

## 2022-09-21 DIAGNOSIS — I5022 Chronic systolic (congestive) heart failure: Secondary | ICD-10-CM

## 2022-09-21 DIAGNOSIS — G4733 Obstructive sleep apnea (adult) (pediatric): Secondary | ICD-10-CM | POA: Diagnosis not present

## 2022-09-21 DIAGNOSIS — Z79899 Other long term (current) drug therapy: Secondary | ICD-10-CM

## 2022-09-21 DIAGNOSIS — I4821 Permanent atrial fibrillation: Secondary | ICD-10-CM

## 2022-09-21 NOTE — Patient Instructions (Signed)
Medication Instructions:  Your physician recommends that you continue on your current medications as directed. Please refer to the Current Medication list given to you today.  *If you need a refill on your cardiac medications before your next appointment, please call your pharmacy*  Lab Work: TODAY: BMET, TSH If you have labs (blood work) drawn today and your tests are completely normal, you will receive your results only by: [place] (if you have MyChart) OR A paper copy in the mail If you have any lab test that is abnormal or we need to change your treatment, we will call you to review the results.  Testing/Procedures: NONE  Follow-Up: At [facility], you and your health needs are our priority.  As part of our continuing mission to provide you with exceptional heart care, we have created designated Provider Care Teams.  These Care Teams include your primary Cardiologist (physician) and Advanced Practice Providers (APPs -  Physician Assistants and Nurse Practitioners) who all work together to provide you with the care you need, when you need it.  Your next appointment:   3-4 month(s) with Dr. Allegra Lai (to determine duration of amiodarone) 4 month with general cardiologist  The format for your next appointment:   In Person  Provider:   Allegra Lai, MD Sinclair Grooms, MD    Important Information About Sugar

## 2022-09-22 ENCOUNTER — Telehealth: Payer: Self-pay

## 2022-09-22 LAB — BASIC METABOLIC PANEL
BUN/Creatinine Ratio: 15 (ref 10–24)
BUN: 23 mg/dL (ref 8–27)
CO2: 22 mmol/L (ref 20–29)
Calcium: 10.9 mg/dL — ABNORMAL HIGH (ref 8.6–10.2)
Chloride: 110 mmol/L — ABNORMAL HIGH (ref 96–106)
Creatinine, Ser: 1.51 mg/dL — ABNORMAL HIGH (ref 0.76–1.27)
Glucose: 90 mg/dL (ref 70–99)
Potassium: 4.3 mmol/L (ref 3.5–5.2)
Sodium: 145 mmol/L — ABNORMAL HIGH (ref 134–144)
eGFR: 50 mL/min/{1.73_m2} — ABNORMAL LOW (ref >59)

## 2022-09-22 LAB — TSH: TSH: 9.63 u[IU]/mL — ABNORMAL HIGH (ref 0.450–4.500)

## 2022-09-22 NOTE — Telephone Encounter (Signed)
**Note De-Identified  Obfuscation** The pt left what looks like a oop RX expense receipt/report at the office with a request for a call back to discuss it. I called him but got no answer so I left a message on his VM asking him to call [name] back at Dr Camnitz's/Dr Smith's office at [facility] at [phone].

## 2022-09-23 NOTE — Telephone Encounter (Signed)
**Note De-Identified Kadajah Kjos Obfuscation** As the pt requested, I have faxed his out of pocket RX expense report to [employer] [place] [employer]. Fax: Tx 'ok' Report CONE_EMAIL-to-Fax Hansen Carino, Mardene Celeste   This message was sent Lotus Santillo [facility], a product from [employer] http://www.biscom.com/  [employer] pioneered [employer] and continues to innovate the most advanced and intelligent fax and secure messaging solutions for enterprises. www.biscom.com                     -------Fax Transmission Report-------  To:               Recipient at [phone] Subject:          Fw: Out Of Pocket RX Expenses Result:           The transmission was successful. Explanation:      All Pages Ok Pages Sent:       3 Connect Time:     2 minutes, 4 seconds Transmit Time:    09/23/2022 13:53 Transfer Rate:    14400 Status Code:      0000 Retry Count:      0 Job Id:           [id number] Unique Id:        [id number] Fax Line:         40 Fax Server:       [id number]

## 2022-09-23 NOTE — Telephone Encounter (Signed)
Patient is returning call. He states he left the receipt at the office for $150 and it needs to be faxed to [facility] at [phone].

## 2022-09-25 ENCOUNTER — Telehealth: Payer: Self-pay

## 2022-09-25 ENCOUNTER — Other Ambulatory Visit (HOSPITAL_COMMUNITY): Payer: Self-pay

## 2022-09-25 DIAGNOSIS — Z79899 Other long term (current) drug therapy: Secondary | ICD-10-CM

## 2022-09-25 DIAGNOSIS — R7989 Other specified abnormal findings of blood chemistry: Secondary | ICD-10-CM

## 2022-09-25 MED ORDER — LEVOTHYROXINE SODIUM 50 MCG PO TABS
50.0000 ug | ORAL_TABLET | Freq: Every day | ORAL | 3 refills | Status: DC
  Filled 2022-09-25: qty 90, 90d supply, fill #0

## 2022-09-25 NOTE — Telephone Encounter (Signed)
-----   Message from Belva Crome, MD sent at 09/23/2022  6:23 PM EDT ----- Let the patient know the labs are okay.  Discontinue furosemide.  I really do not think he is taking it at this time.  Let him know the amiodarone is causing his thyroid to be on the active and that he needs to start taking Synthroid 50 mcg daily.  Needs a repeat TSH in 4 weeks. A copy will be sent to Stacie Glaze, DO

## 2022-09-25 NOTE — Telephone Encounter (Signed)
Spoke with patient and discussed lab results.  Per Dr. Tamala Julian: Let the patient know the labs are okay.  Discontinue furosemide.  I really do not think he is taking it at this time.  Let him know the amiodarone is causing his thyroid to be on the active and that he needs to start taking Synthroid 50 mcg daily.  Needs a repeat TSH in 4 weeks.    Furosemide removed from med list. Synthroid 52mg QD sent to pharmacy of choice. TSH ordered with lab appt made for 10/26/22.  Patient verbalized understanding of the above.

## 2022-09-29 NOTE — Telephone Encounter (Signed)
**Note De-Identified Ross [name] Obfuscation** Pt approved for Eliquis assistance through [employer] until 12/20/2022 per letter received [name] fax. [id number]  The letter states that they have also notified the pt of this approval as well.

## 2022-10-01 DIAGNOSIS — I2510 Atherosclerotic heart disease of native coronary artery without angina pectoris: Secondary | ICD-10-CM | POA: Diagnosis not present

## 2022-10-01 DIAGNOSIS — J449 Chronic obstructive pulmonary disease, unspecified: Secondary | ICD-10-CM | POA: Diagnosis not present

## 2022-10-01 DIAGNOSIS — M170 Bilateral primary osteoarthritis of knee: Secondary | ICD-10-CM | POA: Diagnosis not present

## 2022-10-01 DIAGNOSIS — I10 Essential (primary) hypertension: Secondary | ICD-10-CM | POA: Diagnosis not present

## 2022-10-01 DIAGNOSIS — E7800 Pure hypercholesterolemia, unspecified: Secondary | ICD-10-CM | POA: Diagnosis not present

## 2022-10-01 DIAGNOSIS — E1159 Type 2 diabetes mellitus with other circulatory complications: Secondary | ICD-10-CM | POA: Diagnosis not present

## 2022-10-01 DIAGNOSIS — I5042 Chronic combined systolic (congestive) and diastolic (congestive) heart failure: Secondary | ICD-10-CM | POA: Diagnosis not present

## 2022-10-01 DIAGNOSIS — G8929 Other chronic pain: Secondary | ICD-10-CM | POA: Diagnosis not present

## 2022-10-08 ENCOUNTER — Other Ambulatory Visit (HOSPITAL_COMMUNITY): Payer: Self-pay

## 2022-10-08 MED ORDER — PANTOPRAZOLE SODIUM 40 MG PO TBEC
40.0000 mg | DELAYED_RELEASE_TABLET | Freq: Every day | ORAL | 3 refills | Status: DC
  Filled 2022-10-08: qty 30, 30d supply, fill #0
  Filled 2022-11-09: qty 30, 30d supply, fill #1
  Filled 2022-12-04: qty 30, 30d supply, fill #2
  Filled 2023-01-11: qty 30, 30d supply, fill #3

## 2022-10-26 ENCOUNTER — Encounter: Payer: Medicare Other | Attending: Interventional Cardiology

## 2022-10-26 DIAGNOSIS — Z79899 Other long term (current) drug therapy: Secondary | ICD-10-CM | POA: Diagnosis not present

## 2022-10-26 DIAGNOSIS — R7989 Other specified abnormal findings of blood chemistry: Secondary | ICD-10-CM | POA: Insufficient documentation

## 2022-10-26 LAB — TSH: TSH: 6.85 u[IU]/mL — ABNORMAL HIGH (ref 0.450–4.500)

## 2022-10-29 ENCOUNTER — Other Ambulatory Visit (HOSPITAL_COMMUNITY): Payer: Self-pay

## 2022-11-02 ENCOUNTER — Other Ambulatory Visit (HOSPITAL_COMMUNITY): Payer: Self-pay

## 2022-11-04 ENCOUNTER — Telehealth: Payer: Self-pay | Admitting: Interventional Cardiology

## 2022-11-04 ENCOUNTER — Other Ambulatory Visit (HOSPITAL_COMMUNITY): Payer: Self-pay

## 2022-11-04 DIAGNOSIS — R7989 Other specified abnormal findings of blood chemistry: Secondary | ICD-10-CM

## 2022-11-04 DIAGNOSIS — Z79899 Other long term (current) drug therapy: Secondary | ICD-10-CM

## 2022-11-04 MED ORDER — LEVOTHYROXINE SODIUM 75 MCG PO TABS
75.0000 ug | ORAL_TABLET | Freq: Every day | ORAL | 11 refills | Status: DC
  Filled 2022-11-04: qty 30, 30d supply, fill #0
  Filled 2022-12-04: qty 30, 30d supply, fill #1
  Filled 2023-01-11: qty 30, 30d supply, fill #2
  Filled 2023-02-07: qty 30, 30d supply, fill #3
  Filled 2023-03-12: qty 30, 30d supply, fill #4

## 2022-11-04 MED ORDER — DAPAGLIFLOZIN PROPANEDIOL 10 MG PO TABS: 10.0000 mg | ORAL_TABLET | Freq: Every day | ORAL | 3 refills | Status: DC

## 2022-11-04 NOTE — Telephone Encounter (Signed)
Pt's medication was sent to pt's pharmacy as requested. Confirmation received.  °

## 2022-11-04 NOTE — Telephone Encounter (Signed)
Left message for patient with lab results. Provided office number to callback to schedule lab appt.  Per Dr. Tamala Julian: Let the patient know thyroid still underactive. Increase synthroid to 75 micrograms daily. TSH 6 weeks.   Levothyroxine 42mg QD sent to pharmacy, TSH ordered. Patient to callback and schedule lab appt at end of December/first week of January.  Relayed message to CCaren Griffins[facility] inform her of the above. CCaren Griffinsstates patient calls their office weekly and has stated he has trouble getting through to our office. She states she will relay this message to the patient when she speaks with him again.   CCaren Griffinsexpressed appreciation for follow-up.

## 2022-11-04 NOTE — Telephone Encounter (Signed)
*  STAT* If patient is at the pharmacy, call can be transferred to refill team.   1. Which medications need to be refilled? (please list name of each medication and dose if known)  Farxiga   2. Which pharmacy/location (including street and city if local pharmacy) is medication to be sent to? [place] [address] [place]   3. Do they need a 30 day or 90 day supply?   Standard supply - per [name] patient's medication is already covered through next year through [facility] and he has about a week supply of medication remaining. They are just needing the new prescription.

## 2022-11-04 NOTE — Telephone Encounter (Signed)
Per [name], patient called their office regarding lab results and increasing thyroid medication. She requested that another call be made to the patient to go over this information. [name] states she does not have access to his medication list and does not know the name of the thyroid medication.

## 2022-11-05 ENCOUNTER — Other Ambulatory Visit (HOSPITAL_COMMUNITY): Payer: Self-pay

## 2022-11-05 DIAGNOSIS — H01001 Unspecified blepharitis right upper eyelid: Secondary | ICD-10-CM | POA: Diagnosis not present

## 2022-11-05 MED ORDER — ERYTHROMYCIN 5 MG/GM OP OINT
1.0000 | TOPICAL_OINTMENT | OPHTHALMIC | 0 refills | Status: DC
  Filled 2022-11-05: qty 3.5, 10d supply, fill #0

## 2022-11-09 ENCOUNTER — Other Ambulatory Visit (HOSPITAL_COMMUNITY): Payer: Self-pay

## 2022-11-20 ENCOUNTER — Other Ambulatory Visit: Payer: Self-pay | Admitting: Cardiovascular Disease

## 2022-11-20 DIAGNOSIS — G4733 Obstructive sleep apnea (adult) (pediatric): Secondary | ICD-10-CM

## 2022-11-24 DIAGNOSIS — I5042 Chronic combined systolic (congestive) and diastolic (congestive) heart failure: Secondary | ICD-10-CM | POA: Diagnosis not present

## 2022-11-24 DIAGNOSIS — E7800 Pure hypercholesterolemia, unspecified: Secondary | ICD-10-CM | POA: Diagnosis not present

## 2022-11-24 DIAGNOSIS — E1159 Type 2 diabetes mellitus with other circulatory complications: Secondary | ICD-10-CM | POA: Diagnosis not present

## 2022-11-24 DIAGNOSIS — I10 Essential (primary) hypertension: Secondary | ICD-10-CM | POA: Diagnosis not present

## 2022-11-24 DIAGNOSIS — G8929 Other chronic pain: Secondary | ICD-10-CM | POA: Diagnosis not present

## 2022-11-24 DIAGNOSIS — J449 Chronic obstructive pulmonary disease, unspecified: Secondary | ICD-10-CM | POA: Diagnosis not present

## 2022-12-02 ENCOUNTER — Ambulatory Visit (INDEPENDENT_AMBULATORY_CARE_PROVIDER_SITE_OTHER): Payer: Medicare Other

## 2022-12-02 DIAGNOSIS — I255 Ischemic cardiomyopathy: Secondary | ICD-10-CM

## 2022-12-04 ENCOUNTER — Other Ambulatory Visit: Payer: Self-pay | Admitting: Cardiology

## 2022-12-04 ENCOUNTER — Other Ambulatory Visit: Payer: Self-pay | Admitting: Interventional Cardiology

## 2022-12-04 ENCOUNTER — Other Ambulatory Visit (HOSPITAL_COMMUNITY): Payer: Self-pay

## 2022-12-04 LAB — CUP PACEART REMOTE DEVICE CHECK
Battery Remaining Longevity: 103 mo
Battery Voltage: 3 V
Brady Statistic AP VP Percent: 0 %
Brady Statistic AP VS Percent: 0 %
Brady Statistic AS VP Percent: 0 %
Brady Statistic AS VS Percent: 0 %
Brady Statistic RA Percent Paced: 0 %
Brady Statistic RV Percent Paced: 99.13 %
Date Time Interrogation Session: 20231214202826
HighPow Impedance: 75 Ohm
Implantable Lead Connection Status: 753985
Implantable Lead Connection Status: 753985
Implantable Lead Implant Date: 20230314
Implantable Lead Implant Date: 20230314
Implantable Lead Location: 753860
Implantable Lead Location: 753860
Implantable Lead Model: 3830
Implantable Pulse Generator Implant Date: 20230314
Lead Channel Impedance Value: 285 Ohm
Lead Channel Impedance Value: 342 Ohm
Lead Channel Impedance Value: 361 Ohm
Lead Channel Impedance Value: 4047 Ohm
Lead Channel Impedance Value: 456 Ohm
Lead Channel Impedance Value: 532 Ohm
Lead Channel Pacing Threshold Amplitude: 0.375 V
Lead Channel Pacing Threshold Amplitude: 0.5 V
Lead Channel Pacing Threshold Pulse Width: 0.4 ms
Lead Channel Pacing Threshold Pulse Width: 0.4 ms
Lead Channel Sensing Intrinsic Amplitude: 6 mV
Lead Channel Sensing Intrinsic Amplitude: 8.625 mV
Lead Channel Setting Pacing Amplitude: 2.5 V
Lead Channel Setting Pacing Pulse Width: 0.4 ms
Lead Channel Setting Sensing Sensitivity: 0.3 mV
Zone Setting Status: 755011

## 2022-12-04 MED ORDER — GABAPENTIN 300 MG PO CAPS
300.0000 mg | ORAL_CAPSULE | Freq: Two times a day (BID) | ORAL | 5 refills | Status: DC
  Filled 2022-12-04: qty 60, 30d supply, fill #0
  Filled 2023-01-11: qty 60, 30d supply, fill #1
  Filled 2023-02-07: qty 60, 30d supply, fill #2
  Filled 2023-03-12: qty 60, 30d supply, fill #3
  Filled 2023-05-03 – 2023-05-05 (×2): qty 60, 30d supply, fill #4
  Filled 2023-06-04: qty 60, 30d supply, fill #5

## 2022-12-04 MED ORDER — ISOSORBIDE DINITRATE 20 MG PO TABS
20.0000 mg | ORAL_TABLET | Freq: Three times a day (TID) | ORAL | 0 refills | Status: DC
  Filled 2022-12-04: qty 90, 30d supply, fill #0
  Filled 2023-01-11: qty 90, 30d supply, fill #1
  Filled 2023-02-07: qty 90, 30d supply, fill #2

## 2022-12-04 MED ORDER — HYDRALAZINE HCL 25 MG PO TABS
25.0000 mg | ORAL_TABLET | Freq: Two times a day (BID) | ORAL | 0 refills | Status: DC
  Filled 2022-12-04: qty 180, 90d supply, fill #0

## 2022-12-07 ENCOUNTER — Other Ambulatory Visit (HOSPITAL_COMMUNITY): Payer: Self-pay

## 2022-12-25 ENCOUNTER — Encounter: Payer: Medicare Other | Attending: Interventional Cardiology

## 2022-12-25 DIAGNOSIS — R7989 Other specified abnormal findings of blood chemistry: Secondary | ICD-10-CM

## 2022-12-25 DIAGNOSIS — I4821 Permanent atrial fibrillation: Secondary | ICD-10-CM | POA: Insufficient documentation

## 2022-12-25 DIAGNOSIS — Z79899 Other long term (current) drug therapy: Secondary | ICD-10-CM | POA: Diagnosis not present

## 2022-12-25 DIAGNOSIS — I5022 Chronic systolic (congestive) heart failure: Secondary | ICD-10-CM | POA: Insufficient documentation

## 2022-12-25 DIAGNOSIS — I2510 Atherosclerotic heart disease of native coronary artery without angina pectoris: Secondary | ICD-10-CM | POA: Insufficient documentation

## 2022-12-25 DIAGNOSIS — I472 Ventricular tachycardia, unspecified: Secondary | ICD-10-CM | POA: Insufficient documentation

## 2022-12-25 DIAGNOSIS — D6869 Other thrombophilia: Secondary | ICD-10-CM | POA: Insufficient documentation

## 2022-12-26 LAB — TSH: TSH: 3.28 u[IU]/mL (ref 0.450–4.500)

## 2022-12-29 NOTE — Progress Notes (Signed)
Remote ICD transmission.   

## 2022-12-31 ENCOUNTER — Other Ambulatory Visit (HOSPITAL_COMMUNITY): Payer: Self-pay

## 2022-12-31 DIAGNOSIS — J22 Unspecified acute lower respiratory infection: Secondary | ICD-10-CM | POA: Diagnosis not present

## 2022-12-31 DIAGNOSIS — J441 Chronic obstructive pulmonary disease with (acute) exacerbation: Secondary | ICD-10-CM | POA: Diagnosis not present

## 2022-12-31 MED ORDER — DOXYCYCLINE HYCLATE 100 MG PO TABS
100.0000 mg | ORAL_TABLET | Freq: Every day | ORAL | 0 refills | Status: AC
  Filled 2022-12-31: qty 10, 10d supply, fill #0

## 2022-12-31 MED ORDER — BENZONATATE 200 MG PO CAPS
200.0000 mg | ORAL_CAPSULE | Freq: Three times a day (TID) | ORAL | 0 refills | Status: AC
  Filled 2022-12-31: qty 21, 7d supply, fill #0

## 2022-12-31 MED ORDER — ALBUTEROL SULFATE HFA 108 (90 BASE) MCG/ACT IN AERS
2.0000 | INHALATION_SPRAY | Freq: Four times a day (QID) | RESPIRATORY_TRACT | 0 refills | Status: DC | PRN
  Filled 2022-12-31: qty 6.7, 25d supply, fill #0

## 2023-01-03 ENCOUNTER — Encounter (HOSPITAL_BASED_OUTPATIENT_CLINIC_OR_DEPARTMENT_OTHER): Payer: Medicare Other | Admitting: Cardiovascular Disease

## 2023-01-11 ENCOUNTER — Other Ambulatory Visit: Payer: Self-pay | Admitting: Student

## 2023-01-11 ENCOUNTER — Other Ambulatory Visit: Payer: Self-pay | Admitting: Cardiology

## 2023-01-11 ENCOUNTER — Other Ambulatory Visit (HOSPITAL_COMMUNITY): Payer: Self-pay

## 2023-01-11 DIAGNOSIS — I5042 Chronic combined systolic (congestive) and diastolic (congestive) heart failure: Secondary | ICD-10-CM

## 2023-01-11 DIAGNOSIS — I4901 Ventricular fibrillation: Secondary | ICD-10-CM

## 2023-01-11 DIAGNOSIS — I25709 Atherosclerosis of coronary artery bypass graft(s), unspecified, with unspecified angina pectoris: Secondary | ICD-10-CM

## 2023-01-12 ENCOUNTER — Other Ambulatory Visit: Payer: Self-pay

## 2023-01-12 ENCOUNTER — Encounter (INDEPENDENT_AMBULATORY_CARE_PROVIDER_SITE_OTHER): Payer: Medicare Other | Admitting: Cardiology

## 2023-01-12 ENCOUNTER — Other Ambulatory Visit (HOSPITAL_COMMUNITY): Payer: Self-pay

## 2023-01-12 ENCOUNTER — Encounter: Payer: Self-pay | Admitting: Cardiology

## 2023-01-12 DIAGNOSIS — I2510 Atherosclerotic heart disease of native coronary artery without angina pectoris: Secondary | ICD-10-CM | POA: Diagnosis not present

## 2023-01-12 DIAGNOSIS — I4821 Permanent atrial fibrillation: Secondary | ICD-10-CM | POA: Diagnosis not present

## 2023-01-12 DIAGNOSIS — I472 Ventricular tachycardia, unspecified: Secondary | ICD-10-CM

## 2023-01-12 DIAGNOSIS — D6869 Other thrombophilia: Secondary | ICD-10-CM | POA: Diagnosis not present

## 2023-01-12 DIAGNOSIS — I5022 Chronic systolic (congestive) heart failure: Secondary | ICD-10-CM | POA: Diagnosis not present

## 2023-01-12 LAB — CUP PACEART INCLINIC DEVICE CHECK
Battery Remaining Longevity: 102 mo
Brady Statistic RV Percent Paced: 99.3 %
Date Time Interrogation Session: 20240123172029
HighPow Impedance: 80 Ohm
Implantable Lead Connection Status: 753985
Implantable Lead Connection Status: 753985
Implantable Lead Implant Date: 20230314
Implantable Lead Implant Date: 20230314
Implantable Lead Location: 753860
Implantable Lead Location: 753860
Implantable Lead Model: 3830
Implantable Pulse Generator Implant Date: 20230314
Lead Channel Impedance Value: 3000 Ohm
Lead Channel Impedance Value: 361 Ohm
Lead Channel Impedance Value: 399 Ohm
Lead Channel Pacing Threshold Amplitude: 0.75 V
Lead Channel Pacing Threshold Pulse Width: 0.4 ms

## 2023-01-12 MED ORDER — HYDRALAZINE HCL 25 MG PO TABS
25.0000 mg | ORAL_TABLET | Freq: Two times a day (BID) | ORAL | 3 refills | Status: DC
  Filled 2023-01-12: qty 180, 90d supply, fill #0

## 2023-01-12 MED ORDER — AMIODARONE HCL 200 MG PO TABS
200.0000 mg | ORAL_TABLET | Freq: Every day | ORAL | 3 refills | Status: DC
  Filled 2023-01-12: qty 90, 90d supply, fill #0
  Filled 2023-02-07 – 2023-04-12 (×2): qty 90, 90d supply, fill #1

## 2023-01-12 MED ORDER — CARVEDILOL 6.25 MG PO TABS
6.2500 mg | ORAL_TABLET | Freq: Two times a day (BID) | ORAL | 3 refills | Status: DC
  Filled 2023-01-12: qty 180, 90d supply, fill #0
  Filled 2023-02-07 – 2023-04-12 (×2): qty 180, 90d supply, fill #1

## 2023-01-12 MED ORDER — HYDRALAZINE HCL 25 MG PO TABS
12.5000 mg | ORAL_TABLET | Freq: Three times a day (TID) | ORAL | 3 refills | Status: DC
  Filled 2023-01-12: qty 270, 90d supply, fill #0
  Filled 2023-02-01 – 2023-02-07 (×2): qty 270, 180d supply, fill #0
  Filled 2023-03-12: qty 135, 90d supply, fill #0

## 2023-01-12 MED ORDER — ATORVASTATIN CALCIUM 80 MG PO TABS
80.0000 mg | ORAL_TABLET | Freq: Every day | ORAL | 3 refills | Status: DC
  Filled 2023-01-12: qty 90, 90d supply, fill #0
  Filled 2023-04-12: qty 90, 90d supply, fill #1
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
## 2023-01-12 NOTE — Addendum Note (Signed)
Addended by: Stanton Kidney on: 01/12/2023 05:27 PM   Modules accepted: Orders

## 2023-01-13 ENCOUNTER — Other Ambulatory Visit (HOSPITAL_COMMUNITY): Payer: Self-pay

## 2023-01-20 ENCOUNTER — Other Ambulatory Visit (HOSPITAL_COMMUNITY): Payer: Self-pay

## 2023-01-25 ENCOUNTER — Encounter (HOSPITAL_BASED_OUTPATIENT_CLINIC_OR_DEPARTMENT_OTHER): Payer: Medicare Other | Admitting: Cardiovascular Disease

## 2023-01-26 ENCOUNTER — Other Ambulatory Visit (HOSPITAL_COMMUNITY): Payer: Self-pay

## 2023-02-01 ENCOUNTER — Telehealth: Payer: Self-pay | Admitting: Cardiology

## 2023-02-01 ENCOUNTER — Other Ambulatory Visit (HOSPITAL_COMMUNITY): Payer: Self-pay

## 2023-02-01 NOTE — Telephone Encounter (Signed)
Pt c/o medication issue:  1. Name of Medication:   hydrALAZINE (APRESOLINE) 25 MG tablet   2. How are you currently taking this medication (dosage and times per day)?   3. Are you having a reaction (difficulty breathing--STAT)?   4. What is your medication issue?   Caller stated they will need clarification on the instructions for this medication.

## 2023-02-01 NOTE — Telephone Encounter (Signed)
Spoke to pharmD. Informed that pt is taking 1/2 tablet TID. Apologized for confusion. pharmD will update his instructions. She appreciates my returning her call.

## 2023-02-07 ENCOUNTER — Other Ambulatory Visit (HOSPITAL_COMMUNITY): Payer: Self-pay

## 2023-02-08 ENCOUNTER — Other Ambulatory Visit (HOSPITAL_COMMUNITY): Payer: Self-pay

## 2023-02-08 ENCOUNTER — Other Ambulatory Visit: Payer: Self-pay

## 2023-02-08 MED ORDER — PANTOPRAZOLE SODIUM 40 MG PO TBEC
40.0000 mg | DELAYED_RELEASE_TABLET | Freq: Every day | ORAL | 1 refills | Status: DC
  Filled 2023-02-08: qty 30, 30d supply, fill #0
  Filled 2023-03-12: qty 30, 30d supply, fill #1

## 2023-02-09 ENCOUNTER — Ambulatory Visit (HOSPITAL_BASED_OUTPATIENT_CLINIC_OR_DEPARTMENT_OTHER): Payer: Medicare Other | Attending: Cardiovascular Disease | Admitting: Cardiovascular Disease

## 2023-02-09 VITALS — Ht 73.0 in | Wt 255.0 lb

## 2023-02-09 DIAGNOSIS — Z79899 Other long term (current) drug therapy: Secondary | ICD-10-CM | POA: Insufficient documentation

## 2023-02-09 DIAGNOSIS — I493 Ventricular premature depolarization: Secondary | ICD-10-CM | POA: Insufficient documentation

## 2023-02-09 DIAGNOSIS — Z7951 Long term (current) use of inhaled steroids: Secondary | ICD-10-CM | POA: Insufficient documentation

## 2023-02-09 DIAGNOSIS — G4733 Obstructive sleep apnea (adult) (pediatric): Secondary | ICD-10-CM | POA: Diagnosis not present

## 2023-02-12 ENCOUNTER — Other Ambulatory Visit (HOSPITAL_COMMUNITY): Payer: Self-pay

## 2023-02-17 ENCOUNTER — Encounter (HOSPITAL_BASED_OUTPATIENT_CLINIC_OR_DEPARTMENT_OTHER): Payer: Self-pay | Admitting: Cardiovascular Disease

## 2023-02-17 NOTE — Procedures (Signed)
Patient Name: Ovel, Guba Date: 02/09/2023 Gender: Male D.O.B: 1956/03/30 Age (years): 65 Referring Provider: Shelva Majestic MD, ABSM Height (inches): 72 Interpreting Physician: Shelva Majestic MD, ABSM Weight (lbs): 255 RPSGT: Carolin Coy BMI: 35 MRN: [id number] Neck Size: 16.50  CLINICAL INFORMATION The patient is referred for a CPAP titration to treat sleep apnea.  Date of HST: 08/20/2022: AHI 14.2/h; supine AHI 21.3/h; O2 nadir 81%.  SLEEP STUDY TECHNIQUE As per the AASM Manual for the Scoring of Sleep and Associated Events v2.3 (April 2016) with a hypopnea requiring 4% desaturations.  The channels recorded and monitored were frontal, central and occipital EEG, electrooculogram (EOG), submentalis EMG (chin), nasal and oral airflow, thoracic and abdominal wall motion, anterior tibialis EMG, snore microphone, electrocardiogram, and pulse oximetry. Continuous positive airway pressure (CPAP) was initiated at the beginning of the study and titrated to treat sleep-disordered breathing.  MEDICATIONS albuterol (VENTOLIN HFA) 108 (90 Base) MCG/ACT inhaler ALPRAZolam (XANAX) 0.5 MG tablet amiodarone (PACERONE) 200 MG tablet apixaban (ELIQUIS) 5 MG TABS tablet aspirin 81 MG chewable tablet atorvastatin (LIPITOR) 80 MG tablet budesonide-formoterol (SYMBICORT) 160-4.5 MCG/ACT inhaler carvedilol (COREG) 6.25 MG tablet dapagliflozin propanediol (FARXIGA) 10 MG TABS tablet gabapentin (NEURONTIN) 300 MG capsule hydrALAZINE (APRESOLINE) 25 MG tablet isosorbide dinitrate (ISORDIL) 20 MG tablet levothyroxine (SYNTHROID) 75 MCG tablet pantoprazole (PROTONIX) 40 MG tablet polyethylene glycol powder (GLYCOLAX/MIRALAX) 17 GM/SCOOP powder senna (SENOKOT) 8.6 MG TABS tablet sodium chloride (OCEAN) 0.65 % SOLN nasal spray Tetrahydrozoline HCl (EYE DROPS REGULAR OP) vitamin C (ASCORBIC ACID) 500 MG tablet white petrolatum (VASELINE) GEL Medications self-administered by patient  taken the night of the study : HYDRALAZINE, ISOSORBIDE DINITRATE, XANAX  TECHNICIAN COMMENTS Comments added by technician: PATIENT WAS ORDERED AS A CPAP TITRATION Comments added by scorer: N/A  RESPIRATORY PARAMETERS Optimal PAP Pressure (cm): 8 AHI at Optimal Pressure (/hr): 3.4 Overall Minimal O2 (%): 87.0 Supine % at Optimal Pressure (%): 0 Minimal O2 at Optimal Pressure (%): 88.0   SLEEP ARCHITECTURE The study was initiated at 11:16:50 PM and ended at 5:15:36 AM.  Sleep onset time was 23.7 minutes and the sleep efficiency was 43.9%. The total sleep time was 157.5 minutes.  The patient spent 12.1% of the night in stage N1 sleep, 80.0% in stage N2 sleep, 0.0% in stage N3 and 7.9% in REM.Stage REM latency was 69.5 minutes  Wake after sleep onset was 177.5. Alpha intrusion was absent. Supine sleep was 48.89%.  CARDIAC DATA The 2 lead EKG demonstrated sinus rhythm. The mean heart rate was 60.1 beats per minute. Other EKG findings include: PVCs.  LEG MOVEMENT DATA The total Periodic Limb Movements of Sleep (PLMS) were 0. The PLMS index was 0.0. A PLMS index of <15 is considered normal in adults.  IMPRESSIONS - CPAP was initiated at 6 cm and was titrated to 8 cm of water (AHI 3.4/h, RDI 3.4/h; O2 nadir 88% with absent REM sleep at 8 cm). - Mild oxygen desaturations to a nadir of 87% at 7 cm of water. - The patient snored with soft snoring volume during this titration study. - 2-lead EKG demonstrated: PVCs - Clinically significant periodic limb movements were not noted during this study. Arousals associated with PLMs were rare.  DIAGNOSIS - Obstructive Sleep Apnea (G47.33)  RECOMMENDATIONS - Recommend an initial trial of CPAP Auto therapy with EPR of 3 at 8 - 14 cm H2O with heated humidification. A  Standard size Resmed Nasal [place] FX mask was used for the titration. - Effort should  be made to optimize nasal and oropharyngeal patency. - Avoid alcohol, sedatives and other CNS  depressants that may worsen sleep apnea and disrupt normal sleep architecture. - Sleep hygiene should be reviewed to assess factors that may improve sleep quality. - Weight management (BMI 35) and regular exercise should be initiated or continued. - Recommend a download and sleep clinic evaluation after 4 -6 weeks of therapy.  [Electronically signed] 02/17/2023 05:12 PM  Shelva Majestic MD, [facility], [place], American Board of Sleep Medicine  NPI: [id number]  [place] PH: [phone]   FX: [phone] [place]

## 2023-02-18 ENCOUNTER — Other Ambulatory Visit: Payer: Self-pay

## 2023-02-18 ENCOUNTER — Emergency Department (HOSPITAL_COMMUNITY)
Admission: EM | Admit: 2023-02-18 | Discharge: 2023-02-19 | Disposition: A | Discharge status: Home / Self Care | Payer: Medicare Other | Attending: Emergency Medicine | Admitting: Emergency Medicine

## 2023-02-18 ENCOUNTER — Encounter (HOSPITAL_COMMUNITY): Payer: Self-pay

## 2023-02-18 ENCOUNTER — Emergency Department (HOSPITAL_COMMUNITY): Payer: Medicare Other

## 2023-02-18 DIAGNOSIS — Z1152 Encounter for screening for COVID-19: Secondary | ICD-10-CM | POA: Diagnosis not present

## 2023-02-18 DIAGNOSIS — R079 Chest pain, unspecified: Secondary | ICD-10-CM | POA: Insufficient documentation

## 2023-02-18 DIAGNOSIS — Z7982 Long term (current) use of aspirin: Secondary | ICD-10-CM | POA: Diagnosis not present

## 2023-02-18 DIAGNOSIS — R0602 Shortness of breath: Secondary | ICD-10-CM | POA: Diagnosis not present

## 2023-02-18 DIAGNOSIS — I6523 Occlusion and stenosis of bilateral carotid arteries: Secondary | ICD-10-CM | POA: Diagnosis not present

## 2023-02-18 DIAGNOSIS — Z7901 Long term (current) use of anticoagulants: Secondary | ICD-10-CM | POA: Diagnosis not present

## 2023-02-18 DIAGNOSIS — R519 Headache, unspecified: Secondary | ICD-10-CM | POA: Insufficient documentation

## 2023-02-18 DIAGNOSIS — Z8679 Personal history of other diseases of the circulatory system: Secondary | ICD-10-CM | POA: Insufficient documentation

## 2023-02-18 DIAGNOSIS — R0789 Other chest pain: Secondary | ICD-10-CM | POA: Diagnosis not present

## 2023-02-18 LAB — COMPREHENSIVE METABOLIC PANEL
ALT: 16 U/L (ref 0–44)
AST: 42 U/L — ABNORMAL HIGH (ref 15–41)
Albumin: 3.6 g/dL (ref 3.5–5.0)
Alkaline Phosphatase: 87 U/L (ref 38–126)
Anion gap: 10 (ref 5–15)
BUN: 22 mg/dL (ref 8–23)
CO2: 21 mmol/L — ABNORMAL LOW (ref 22–32)
Calcium: 10.4 mg/dL — ABNORMAL HIGH (ref 8.9–10.3)
Chloride: 106 mmol/L (ref 98–111)
Creatinine, Ser: 1.42 mg/dL — ABNORMAL HIGH (ref 0.61–1.24)
GFR, Estimated: 54 mL/min — ABNORMAL LOW (ref >60)
Glucose, Bld: 102 mg/dL — ABNORMAL HIGH (ref 70–99)
Potassium: 5.1 mmol/L (ref 3.5–5.1)
Sodium: 137 mmol/L (ref 135–145)
Total Bilirubin: 1.9 mg/dL — ABNORMAL HIGH (ref 0.3–1.2)
Total Protein: 6.2 g/dL — ABNORMAL LOW (ref 6.5–8.1)

## 2023-02-18 LAB — RESP PANEL BY RT-PCR (RSV, FLU A&B, COVID)  RVPGX2
Influenza A by PCR: NEGATIVE
Influenza B by PCR: NEGATIVE
Resp Syncytial Virus by PCR: NEGATIVE
SARS Coronavirus 2 by RT PCR: NEGATIVE

## 2023-02-18 LAB — CBC WITH DIFFERENTIAL/PLATELET
Abs Immature Granulocytes: 0.03 10*3/uL (ref 0.00–0.07)
Basophils Absolute: 0.1 10*3/uL (ref 0.0–0.1)
Basophils Relative: 1 %
Eosinophils Absolute: 0.3 10*3/uL (ref 0.0–0.5)
Eosinophils Relative: 5 %
HCT: 47.2 % (ref 39.0–52.0)
Hemoglobin: 15.1 g/dL (ref 13.0–17.0)
Immature Granulocytes: 0 %
Lymphocytes Relative: 28 %
Lymphs Abs: 1.9 10*3/uL (ref 0.7–4.0)
MCH: 31.3 pg (ref 26.0–34.0)
MCHC: 32 g/dL (ref 30.0–36.0)
MCV: 97.7 fL (ref 80.0–100.0)
Monocytes Absolute: 0.7 10*3/uL (ref 0.1–1.0)
Monocytes Relative: 10 %
Neutro Abs: 3.9 10*3/uL (ref 1.7–7.7)
Neutrophils Relative %: 56 %
Platelets: 213 10*3/uL (ref 150–400)
RBC: 4.83 MIL/uL (ref 4.22–5.81)
RDW: 13.2 % (ref 11.5–15.5)
WBC: 6.8 10*3/uL (ref 4.0–10.5)
nRBC: 0 % (ref 0.0–0.2)

## 2023-02-18 MED ORDER — GABAPENTIN 300 MG PO CAPS
300.0000 mg | ORAL_CAPSULE | Freq: Once | ORAL | Status: AC
  Administered 2023-02-19: 300 mg via ORAL
  Filled 2023-02-18: qty 1

## 2023-02-18 MED ORDER — ACETAMINOPHEN 500 MG PO TABS
1000.0000 mg | ORAL_TABLET | Freq: Once | ORAL | Status: AC
  Administered 2023-02-19: 1000 mg via ORAL
  Filled 2023-02-18: qty 2

## 2023-02-18 NOTE — ED Provider Notes (Addendum)
[facility] Provider Note   CSN: [id number] Arrival date & time: 02/18/23  1844     History  Chief Complaint  Patient presents with   Headache    Ross [name] is a 67 y.o. male.  Patient with history of cardiac arrest, defibrillator in 2022, cardiac surgery 2005, brain aneurysm diagnosed 2005 patient per his report did not have surgery for this, on Eliquis compliant presents with left-sided headache since yesterday different than his normal.  Since his cardiac arrest patient does have chronic headaches but this is different location.  Gradual onset.  Radiates from left mid parietal scalp anterior and inferior into face.  Mild left jaw symptoms.  No fevers or chills.  No history of temporal arteritis known.  Patient is due for his nightly meds has not taken.       Home Medications Prior to Admission medications   Medication Sig Start Date End Date Taking? Authorizing Provider  albuterol (VENTOLIN HFA) 108 (90 Base) MCG/ACT inhaler Inhale 2 puffs into the lungs every 6 (six) hours as needed. 12/31/22     ALPRAZolam (XANAX) 0.5 MG tablet Take 1-2 tablets (0.5-1 mg total) by mouth 2 (two) times daily. 09/08/22     amiodarone (PACERONE) 200 MG tablet Take 1 tablet (200 mg total) by mouth daily. 01/12/23   Shirley Friar, PA-C  apixaban (ELIQUIS) 5 MG TABS tablet Take 1 tablet (5 mg total) by mouth 2 (two) times daily. 06/09/22   Belva Crome, MD  aspirin 81 MG chewable tablet Chew 1 tablet (81 mg total) by mouth daily. 06/13/21   Mikhail, Velta Addison, DO  atorvastatin (LIPITOR) 80 MG tablet Take 1 tablet (80 mg total) by mouth daily. 01/12/23   Shirley Friar, PA-C  budesonide-formoterol (SYMBICORT) 160-4.5 MCG/ACT inhaler Inhale 2 puffs into the lungs 2 (two) times daily as needed (shortness of breath).    [provider]  carvedilol (COREG) 6.25 MG tablet Take 1 tablet (6.25 mg total) by mouth 2 (two) times daily with a  meal. 01/12/23   Tillery, Satira Mccallum, PA-C  dapagliflozin propanediol (FARXIGA) 10 MG TABS tablet Take 1 tablet (10 mg total) by mouth daily before breakfast. 11/04/22   Belva Crome, MD  gabapentin (NEURONTIN) 300 MG capsule Take 1 capsule (300 mg total) by mouth 2 (two) times daily. 12/04/22     hydrALAZINE (APRESOLINE) 25 MG tablet Take 0.5 tablets (12.5 mg total) by mouth 3 (three) times daily. 01/12/23   Camnitz, Ocie Doyne, MD  isosorbide dinitrate (ISORDIL) 20 MG tablet Take 1 tablet (20 mg total) by mouth 3 (three) times daily. 12/04/22   Belva Crome, MD  levothyroxine (SYNTHROID) 75 MCG tablet Take 1 tablet (75 mcg total) by mouth daily before breakfast. 11/04/22   Belva Crome, MD  pantoprazole (PROTONIX) 40 MG tablet Take 1 tablet (40 mg total) by mouth daily. 02/08/23     polyethylene glycol powder (GLYCOLAX/MIRALAX) 17 GM/SCOOP powder Take 17 g by mouth 2 (two) times daily. Patient taking differently: Take 17 g by mouth daily as needed (constipation.). 06/26/21   Love, Ivan Anchors, PA-C  senna (SENOKOT) 8.6 MG TABS tablet Take 2 tablets by mouth at bedtime.    [provider]  sodium chloride (OCEAN) 0.65 % SOLN nasal spray Place 1 spray into both nostrils as needed for congestion. 06/25/21   Love, Ivan Anchors, PA-C  Tetrahydrozoline HCl (EYE DROPS REGULAR OP) Place 1 drop into both eyes daily  as needed (irritation).    [provider]  vitamin C (ASCORBIC ACID) 500 MG tablet Take 500 mg by mouth daily.    [provider]  white petrolatum (VASELINE) GEL Apply 1 application  topically as needed (wound care).    [provider]      Allergies    Patient has no known allergies.    Review of Systems   Review of Systems  Constitutional:  Negative for chills and fever.  HENT:  Negative for congestion.   Eyes:  Negative for visual disturbance.  Respiratory:  Negative for shortness of breath.   Cardiovascular:  Negative for chest pain.   Gastrointestinal:  Negative for abdominal pain and vomiting.  Genitourinary:  Negative for dysuria and flank pain.  Musculoskeletal:  Negative for back pain, neck pain and neck stiffness.  Skin:  Negative for rash.  Neurological:  Positive for headaches. Negative for weakness and light-headedness.    Physical Exam Updated Vital Signs BP (!) 147/72   Pulse (!) 59   Temp (!) 97.5 F (36.4 C) (Oral)   Resp (!) 22   Ht '6\' 1"'$  (1.854 m)   Wt 115.7 kg   SpO2 97%   BMI 33.65 kg/m  Physical Exam Vitals and nursing note reviewed.  Constitutional:      General: He is not in acute distress.    Appearance: He is well-developed.  HENT:     Head: Normocephalic and atraumatic.     Mouth/Throat:     Mouth: Mucous membranes are moist.  Eyes:     General:        Right eye: No discharge.        Left eye: No discharge.     Conjunctiva/sclera: Conjunctivae normal.  Neck:     Trachea: No tracheal deviation.  Cardiovascular:     Rate and Rhythm: Normal rate and regular rhythm.  Pulmonary:     Effort: Pulmonary effort is normal.     Breath sounds: Normal breath sounds.  Abdominal:     General: There is no distension.     Palpations: Abdomen is soft.     Tenderness: There is no abdominal tenderness. There is no guarding.  Musculoskeletal:     Cervical back: Normal range of motion and neck supple. No rigidity.  Skin:    General: Skin is warm.     Capillary Refill: Capillary refill takes less than 2 seconds.     Findings: No rash.  Neurological:     General: No focal deficit present.     Mental Status: He is alert.     GCS: GCS eye subscore is 4. GCS verbal subscore is 5. GCS motor subscore is 6.     Cranial Nerves: No cranial nerve deficit.     Comments: Cranial nerves intact.  General weakness equal strength bilateral.  Patient points to left mid parietal area where headache starts.  No temple tenderness to palpation.  No trismus.  No meningismus.  Psychiatric:        Mood and  Affect: Affect is flat.     Comments: Patient has flat affect.     ED Results / Procedures / Treatments   Labs (all labs ordered are listed, but only abnormal results are displayed) Labs Reviewed  COMPREHENSIVE METABOLIC PANEL - Abnormal; Notable for the following components:      Result Value   CO2 21 (*)    Glucose, Bld 102 (*)    Creatinine, Ser 1.42 (*)  Calcium 10.4 (*)    Total Protein 6.2 (*)    AST 42 (*)    Total Bilirubin 1.9 (*)    GFR, Estimated 54 (*)    All other components within normal limits  RESP PANEL BY RT-PCR (RSV, FLU A&B, COVID)  RVPGX2  CBC WITH DIFFERENTIAL/PLATELET  SEDIMENTATION RATE    EKG None  Radiology DG Chest 1 View  Result Date: 02/18/2023 CLINICAL DATA:  Shortness of breath EXAM: CHEST  1 VIEW COMPARISON:  X-ray 03/03/2022 and older FINDINGS: Enlarged cardiopericardial silhouette. Calcified and tortuous aorta. Central vascular congestion. Slight right basilar scar or atelectasis. No pneumothorax or effusion. There are 2 battery packs identified. The smaller is seen along the left upper thorax and the larger right. Both have leads extending along the right side of the heart with pacemaker and defibrillator leads. Stable overall alignment compared to most recent prior. Going back to the remote study of December 2022 the orientation of the left chest battery pack is 90 degrees different. Please correlate with clinical history. IMPRESSION: Postop chest with enlarged cardiopericardial silhouette vascular congestion. Separate pacemaker and defibrillator. Orientation of the battery pack in the left upper thorax is similar to the more recent study of March 2023 but 90 degrees rotated relative to the study of December 2022. Please correlate with clinical history Electronically Signed   By: Jill Side M.D.   On: 02/18/2023 20:29   CT Head Wo Contrast  Result Date: 02/18/2023 CLINICAL DATA:  Left temporal headache, EXAM: CT HEAD WITHOUT CONTRAST  TECHNIQUE: Contiguous axial images were obtained from the base of the skull through the vertex without intravenous contrast. RADIATION DOSE REDUCTION: This exam was performed according to the departmental dose-optimization program which includes automated exposure control, adjustment of the mA and/or kV according to patient size and/or use of iterative reconstruction technique. COMPARISON:  06/03/2021 FINDINGS: Brain: No evidence of acute infarction, hemorrhage, hydrocephalus, extra-axial collection or mass lesion/mass effect. Vascular: An 8 mm rounded hyperdense structure is seen in the region of the right A1 segment suspicious for a vascular aneurysm. This is best seen on sagittal image # 29 and coronal image # 31. This is not optimally assessed on this examination. Skull: Normal. Negative for fracture or focal lesion. Sinuses/Orbits: No acute finding. Other: Mastoid air cells and middle ear cavities are clear. IMPRESSION: 1. No acute intracranial abnormality. 2. 8 mm rounded hyperdense structure in the region of the right A1 segment suspicious for a vascular aneurysm. This is not optimally assessed on this examination. Dedicated CT arteriography is recommended for further evaluation. Electronically Signed   By: Fidela Salisbury M.D.   On: 02/18/2023 20:02    Procedures Ultrasound ED Peripheral IV (Provider)  Date/Time: 02/19/2023 12:37 AM  Performed by: Elnora Morrison, MD Authorized by: Elnora Morrison, MD   Procedure details:    Indications: hydration     Location:  Right AC   Angiocath:  20 G   Bedside Ultrasound Guided: Yes     Images: archived     Patient tolerated procedure without complications: Yes       Medications Ordered in ED Medications  gabapentin (NEURONTIN) capsule 300 mg (300 mg Oral Given 02/19/23 0034)  acetaminophen (TYLENOL) tablet 1,000 mg (1,000 mg Oral Given 02/19/23 0032)    ED Course/ Medical Decision Making/ A&P                             Medical Decision  Making Amount and/or Complexity of Data Reviewed Labs: ordered. Radiology: ordered.  Risk OTC drugs. Prescription drug management.   Patient with complex medical history including cardiac arrest and defibrillator and chronic headaches presents with headache different than normal.  Currently no chest pain or shortness of breath, no fever or cough.  No focal neurodeficits.  With high risk history and known aneurysm with anticoagulant use patient had screening blood work and CT head without contrast ordered in triage.  Results reviewed no signs significant anemia electrolytes unremarkable, CT head without contrast showed no acute bleeding however did show 8 mm focus which may represent aneurysm.  Chest x-ray independently reviewed showing separate pacemaker and defibrillator, patient describes this in his history.  With new headache plan for CT angiogram for further delineation.  Sed rate also will be sent with differential including temporal arteritis.  Plan to start with gabapentin and Tylenol if patient needs further medication will consider fentanyl or medication for trigeminal neuralgia.  Patient care will be signed out to reassess follow-up results.  Patient IV difficult multiple times.  Ultrasound-guided by myself.  Pain meds ordered.      Final Clinical Impression(s) / ED Diagnoses Final diagnoses:  Left-sided headache  History of aneurysm of artery    Rx / DC Orders ED Discharge Orders     None         Elnora Morrison, MD 02/18/23 2312    Elnora Morrison, MD 02/19/23 [phone]

## 2023-02-18 NOTE — ED Triage Notes (Signed)
Pt c/o HA in left side of head that radiates down left side of neck started yesterday. Left side of head is sensitive to the touch. Pt c/o left sided chest pain pt states he periodically has it.

## 2023-02-18 NOTE — ED Notes (Signed)
Pt ambulated to the bathroom.  

## 2023-02-18 NOTE — ED Provider Triage Note (Signed)
Emergency Medicine Provider Triage Evaluation Note  Ross [name] , a 67 y.o. male  was evaluated in triage.  Pt complains of headache.  Patient reports he had a left-sided painful sensation from the temporal aspect of the head that radiates into the left side of the face.  Patient denies any severe chest pain or shortness of breath.  Significant past history of cardiac issues and currently has a defibrillator in place.  Review of Systems  Positive: As above Negative: As above  Physical Exam  BP (!) 146/71 (BP Location: Right Arm)   Pulse 63   Temp 97.6 F (36.4 C) (Oral)   Resp 20   Ht '6\' 1"'$  (1.854 m)   Wt 115.7 kg   SpO2 98%   BMI 33.65 kg/m  Gen:   Awake, no distress   Resp:  Normal effort  MSK:   Moves extremities without difficulty  Other:  No rashes noted.  No altered sensation.  Medical Decision Making  Medically screening exam initiated at 7:09 PM.  Appropriate orders placed.  DONTAY BURDELL was informed that the remainder of the evaluation will be completed by another provider, this initial triage assessment does not replace that evaluation, and the importance of remaining in the ED until their evaluation is complete.     Luvenia Heller, PA-C 02/18/23 1910

## 2023-02-19 ENCOUNTER — Emergency Department (HOSPITAL_COMMUNITY): Payer: Medicare Other

## 2023-02-19 DIAGNOSIS — I6523 Occlusion and stenosis of bilateral carotid arteries: Secondary | ICD-10-CM | POA: Diagnosis not present

## 2023-02-19 LAB — SEDIMENTATION RATE: Sed Rate: 1 mm/hr (ref 0–16)

## 2023-02-19 MED ORDER — PROCHLORPERAZINE EDISYLATE 10 MG/2ML IJ SOLN
10.0000 mg | Freq: Once | INTRAMUSCULAR | Status: AC
  Administered 2023-02-19: 10 mg via INTRAVENOUS
  Filled 2023-02-19: qty 2

## 2023-02-19 MED ORDER — HYDROCODONE-ACETAMINOPHEN 5-325 MG PO TABS: 1.0000 | ORAL_TABLET | Freq: Four times a day (QID) | ORAL | 0 refills | Status: DC | PRN

## 2023-02-19 MED ORDER — FENTANYL CITRATE PF 50 MCG/ML IJ SOSY
50.0000 ug | PREFILLED_SYRINGE | Freq: Once | INTRAMUSCULAR | Status: DC
  Filled 2023-02-19: qty 1

## 2023-02-19 MED ORDER — IOHEXOL 350 MG/ML SOLN
75.0000 mL | Freq: Once | INTRAVENOUS | Status: AC | PRN
  Administered 2023-02-19: 75 mL via INTRAVENOUS

## 2023-02-19 NOTE — Progress Notes (Signed)
ED provider at bedside and has placed US guided PIV

## 2023-02-19 NOTE — ED Notes (Signed)
Zavitz at the bedside for IV ultrasound

## 2023-02-19 NOTE — ED Provider Notes (Signed)
I assumed care in signout to follow-up on CT imaging.  Patient has extensive medical history including previous cardiac arrest, defibrillator in place, is on anticoagulation, and known history of cerebral aneurysm.  He presents with multiple headache symptoms.  Apparently some of these headaches are chronic, but then he started having left-sided headache that is new.  Noted the headache was sudden onset. Initial CT head ruled out SAH, but does have evidence of aneurysm.  Last neuroimaging from 2007 revealed anterior communicating aneurysm that is still present but is larger this time.  There is no evidence of any rupture.  Patient is awake and alert in no acute distress.  Much of his headache symptoms appear to be chronic.  Patient would benefit from outpatient follow-up with neurosurgery to evaluate any options for repair.  Discussed at length with patient and his fiance.  Given strict ER return precautions.   Ripley Fraise, MD 02/19/23 [phone]

## 2023-02-22 ENCOUNTER — Other Ambulatory Visit (HOSPITAL_COMMUNITY): Payer: Self-pay

## 2023-02-22 DIAGNOSIS — E7800 Pure hypercholesterolemia, unspecified: Secondary | ICD-10-CM | POA: Diagnosis not present

## 2023-02-22 DIAGNOSIS — E1159 Type 2 diabetes mellitus with other circulatory complications: Secondary | ICD-10-CM | POA: Diagnosis not present

## 2023-02-22 DIAGNOSIS — I10 Essential (primary) hypertension: Secondary | ICD-10-CM | POA: Diagnosis not present

## 2023-02-22 DIAGNOSIS — I2510 Atherosclerotic heart disease of native coronary artery without angina pectoris: Secondary | ICD-10-CM | POA: Diagnosis not present

## 2023-02-22 DIAGNOSIS — J449 Chronic obstructive pulmonary disease, unspecified: Secondary | ICD-10-CM | POA: Diagnosis not present

## 2023-02-22 DIAGNOSIS — I5042 Chronic combined systolic (congestive) and diastolic (congestive) heart failure: Secondary | ICD-10-CM | POA: Diagnosis not present

## 2023-02-22 DIAGNOSIS — G8929 Other chronic pain: Secondary | ICD-10-CM | POA: Diagnosis not present

## 2023-02-23 ENCOUNTER — Other Ambulatory Visit (HOSPITAL_COMMUNITY): Payer: Self-pay

## 2023-02-23 MED ORDER — HYDROCODONE-ACETAMINOPHEN 5-325 MG PO TABS
1.0000 | ORAL_TABLET | Freq: Four times a day (QID) | ORAL | 0 refills | Status: DC | PRN
  Filled 2023-02-23: qty 10, 3d supply, fill #0

## 2023-02-24 DIAGNOSIS — I671 Cerebral aneurysm, nonruptured: Secondary | ICD-10-CM | POA: Diagnosis not present

## 2023-02-26 ENCOUNTER — Telehealth: Payer: Self-pay | Admitting: *Deleted

## 2023-02-26 NOTE — Telephone Encounter (Signed)
Patient notified sleep study has been completed and CPAP order has been sent to [facility].

## 2023-02-26 NOTE — Telephone Encounter (Signed)
-----   Message from Troy Sine, MD sent at 02/17/2023  5:19 PM EST ----- [name], please notify pt of results and set up with DME for CPAP initiation.

## 2023-02-26 NOTE — Telephone Encounter (Signed)
     Patient  visit on [place]  on 02/19/2023 was for treatment  Have you been able to follow up with your primary care physician? Feeling some better but running out of pain med will reach out to pcp  The patient was able to obtain any needed medicine or equipment.  Are there diet recommendations that you are having difficulty following?  Patient expresses understanding of discharge instructions and education provided has no other needs at this time.    [place] [phone] [address] , [place] [address] Email : [email or web address]

## 2023-03-05 ENCOUNTER — Other Ambulatory Visit (HOSPITAL_COMMUNITY): Payer: Self-pay

## 2023-03-10 ENCOUNTER — Other Ambulatory Visit: Payer: Self-pay | Admitting: Neurosurgery

## 2023-03-10 DIAGNOSIS — I671 Cerebral aneurysm, nonruptured: Secondary | ICD-10-CM

## 2023-03-11 DIAGNOSIS — G4733 Obstructive sleep apnea (adult) (pediatric): Secondary | ICD-10-CM | POA: Diagnosis not present

## 2023-03-12 ENCOUNTER — Other Ambulatory Visit (HOSPITAL_COMMUNITY): Payer: Self-pay

## 2023-03-12 ENCOUNTER — Other Ambulatory Visit: Payer: Self-pay | Admitting: Interventional Cardiology

## 2023-03-12 DIAGNOSIS — I482 Chronic atrial fibrillation, unspecified: Secondary | ICD-10-CM

## 2023-03-12 MED ORDER — ISOSORBIDE DINITRATE 20 MG PO TABS
20.0000 mg | ORAL_TABLET | Freq: Three times a day (TID) | ORAL | 0 refills | Status: DC
  Filled 2023-03-12 (×2): qty 270, 90d supply, fill #0

## 2023-03-12 MED ORDER — APIXABAN 5 MG PO TABS
5.0000 mg | ORAL_TABLET | Freq: Two times a day (BID) | ORAL | 5 refills | Status: DC
  Filled 2023-03-12: qty 60, 30d supply, fill #0

## 2023-03-12 NOTE — Telephone Encounter (Signed)
Prescription refill request for Eliquis received. Indication: afib  Last office visit: Camnitz 01/12/2023 Scr: 1.42, 02/18/2023 Age: 67 yo  Weight: 115 kg   Refill sent.

## 2023-03-14 ENCOUNTER — Other Ambulatory Visit (HOSPITAL_COMMUNITY): Payer: Self-pay

## 2023-03-15 ENCOUNTER — Other Ambulatory Visit: Payer: Self-pay

## 2023-03-15 ENCOUNTER — Other Ambulatory Visit (HOSPITAL_COMMUNITY): Payer: Self-pay

## 2023-03-15 DIAGNOSIS — I671 Cerebral aneurysm, nonruptured: Secondary | ICD-10-CM | POA: Diagnosis not present

## 2023-03-15 DIAGNOSIS — N1831 Chronic kidney disease, stage 3a: Secondary | ICD-10-CM | POA: Diagnosis not present

## 2023-03-15 DIAGNOSIS — H01001 Unspecified blepharitis right upper eyelid: Secondary | ICD-10-CM | POA: Diagnosis not present

## 2023-03-15 DIAGNOSIS — I10 Essential (primary) hypertension: Secondary | ICD-10-CM | POA: Diagnosis not present

## 2023-03-15 MED ORDER — HYDROCODONE-ACETAMINOPHEN 5-325 MG PO TABS
1.0000 | ORAL_TABLET | Freq: Two times a day (BID) | ORAL | 0 refills | Status: DC | PRN
  Filled 2023-03-15: qty 28, 14d supply, fill #0

## 2023-03-15 MED ORDER — ALPRAZOLAM 0.5 MG PO TABS
0.5000 mg | ORAL_TABLET | Freq: Three times a day (TID) | ORAL | 0 refills | Status: DC
  Filled 2023-03-15: qty 90, 15d supply, fill #0

## 2023-03-15 MED ORDER — ERYTHROMYCIN 5 MG/GM OP OINT
1.0000 | TOPICAL_OINTMENT | Freq: Every day | OPHTHALMIC | 0 refills | Status: DC
  Filled 2023-03-15: qty 3.5, 3d supply, fill #0

## 2023-03-16 ENCOUNTER — Other Ambulatory Visit (HOSPITAL_COMMUNITY): Payer: Self-pay

## 2023-03-17 ENCOUNTER — Other Ambulatory Visit (HOSPITAL_COMMUNITY): Payer: Self-pay

## 2023-03-19 ENCOUNTER — Other Ambulatory Visit (HOSPITAL_COMMUNITY): Payer: Self-pay

## 2023-03-26 ENCOUNTER — Other Ambulatory Visit (HOSPITAL_COMMUNITY): Payer: Self-pay

## 2023-03-26 DIAGNOSIS — I77810 Thoracic aortic ectasia: Secondary | ICD-10-CM | POA: Diagnosis not present

## 2023-03-26 DIAGNOSIS — I700 Atherosclerosis of aorta: Secondary | ICD-10-CM | POA: Diagnosis not present

## 2023-03-26 DIAGNOSIS — J449 Chronic obstructive pulmonary disease, unspecified: Secondary | ICD-10-CM | POA: Diagnosis not present

## 2023-03-26 DIAGNOSIS — E039 Hypothyroidism, unspecified: Secondary | ICD-10-CM | POA: Diagnosis not present

## 2023-03-26 DIAGNOSIS — E1159 Type 2 diabetes mellitus with other circulatory complications: Secondary | ICD-10-CM | POA: Diagnosis not present

## 2023-03-26 DIAGNOSIS — N1831 Chronic kidney disease, stage 3a: Secondary | ICD-10-CM | POA: Diagnosis not present

## 2023-03-26 DIAGNOSIS — I130 Hypertensive heart and chronic kidney disease with heart failure and stage 1 through stage 4 chronic kidney disease, or unspecified chronic kidney disease: Secondary | ICD-10-CM | POA: Diagnosis not present

## 2023-03-26 DIAGNOSIS — I10 Essential (primary) hypertension: Secondary | ICD-10-CM | POA: Diagnosis not present

## 2023-03-26 DIAGNOSIS — E1122 Type 2 diabetes mellitus with diabetic chronic kidney disease: Secondary | ICD-10-CM | POA: Diagnosis not present

## 2023-03-26 DIAGNOSIS — I4821 Permanent atrial fibrillation: Secondary | ICD-10-CM | POA: Diagnosis not present

## 2023-03-26 DIAGNOSIS — Z95810 Presence of automatic (implantable) cardiac defibrillator: Secondary | ICD-10-CM | POA: Diagnosis not present

## 2023-03-26 DIAGNOSIS — I5042 Chronic combined systolic (congestive) and diastolic (congestive) heart failure: Secondary | ICD-10-CM | POA: Diagnosis not present

## 2023-03-26 DIAGNOSIS — E7800 Pure hypercholesterolemia, unspecified: Secondary | ICD-10-CM | POA: Diagnosis not present

## 2023-03-26 DIAGNOSIS — I671 Cerebral aneurysm, nonruptured: Secondary | ICD-10-CM | POA: Diagnosis not present

## 2023-03-26 MED ORDER — GABAPENTIN 300 MG PO CAPS
300.0000 mg | ORAL_CAPSULE | Freq: Two times a day (BID) | ORAL | 5 refills | Status: DC
  Filled 2023-03-26: qty 60, 30d supply, fill #0

## 2023-03-26 MED ORDER — ELIQUIS 5 MG PO TABS
5.0000 mg | ORAL_TABLET | Freq: Two times a day (BID) | ORAL | 4 refills | Status: DC
  Filled 2023-03-26: qty 180, 90d supply, fill #0
  Filled 2023-04-12: qty 60, 30d supply, fill #0
  Filled 2023-05-03 – 2023-05-05 (×2): qty 60, 30d supply, fill #1
  Filled 2023-06-04: qty 60, 30d supply, fill #2

## 2023-03-26 MED ORDER — BREZTRI AEROSPHERE 160-9-4.8 MCG/ACT IN AERO
2.0000 | INHALATION_SPRAY | Freq: Two times a day (BID) | RESPIRATORY_TRACT | 12 refills | Status: DC
  Filled 2023-03-26: qty 10.7, 30d supply, fill #0

## 2023-03-26 MED ORDER — ALPRAZOLAM 0.5 MG PO TABS
0.5000 mg | ORAL_TABLET | Freq: Three times a day (TID) | ORAL | 2 refills | Status: DC
  Filled 2023-03-26: qty 90, 30d supply, fill #0

## 2023-03-29 ENCOUNTER — Other Ambulatory Visit: Payer: Self-pay | Admitting: Internal Medicine

## 2023-03-29 ENCOUNTER — Other Ambulatory Visit (HOSPITAL_COMMUNITY): Payer: Self-pay

## 2023-03-29 ENCOUNTER — Other Ambulatory Visit: Payer: Self-pay

## 2023-03-29 MED ORDER — HYDROCODONE-ACETAMINOPHEN 5-325 MG PO TABS
1.0000 | ORAL_TABLET | Freq: Two times a day (BID) | ORAL | 0 refills | Status: DC
  Filled 2023-03-29: qty 28, 14d supply, fill #0

## 2023-03-29 MED ORDER — ALPRAZOLAM 0.5 MG PO TABS
0.5000 mg | ORAL_TABLET | Freq: Three times a day (TID) | ORAL | 2 refills | Status: DC | PRN
  Filled 2023-03-29 – 2023-04-12 (×3): qty 90, 30d supply, fill #0
  Filled 2023-05-10: qty 90, 30d supply, fill #1
  Filled 2023-06-07: qty 90, 30d supply, fill #2

## 2023-03-30 ENCOUNTER — Other Ambulatory Visit: Payer: Self-pay | Admitting: Neurosurgery

## 2023-03-30 ENCOUNTER — Other Ambulatory Visit (HOSPITAL_COMMUNITY): Payer: Self-pay

## 2023-03-30 ENCOUNTER — Ambulatory Visit (HOSPITAL_COMMUNITY)
Admission: RE | Admit: 2023-03-30 | Discharge: 2023-03-30 | Disposition: A | Discharge status: Home / Self Care | Payer: Medicare Other | Source: Ambulatory Visit | Attending: Neurosurgery | Admitting: Neurosurgery

## 2023-03-30 ENCOUNTER — Other Ambulatory Visit: Payer: Self-pay

## 2023-03-30 DIAGNOSIS — I10 Essential (primary) hypertension: Secondary | ICD-10-CM | POA: Diagnosis not present

## 2023-03-30 DIAGNOSIS — I671 Cerebral aneurysm, nonruptured: Secondary | ICD-10-CM

## 2023-03-30 DIAGNOSIS — Z87891 Personal history of nicotine dependence: Secondary | ICD-10-CM | POA: Diagnosis not present

## 2023-03-30 DIAGNOSIS — I4891 Unspecified atrial fibrillation: Secondary | ICD-10-CM | POA: Insufficient documentation

## 2023-03-30 DIAGNOSIS — Z7901 Long term (current) use of anticoagulants: Secondary | ICD-10-CM | POA: Diagnosis not present

## 2023-03-30 HISTORY — PX: IR US GUIDE VASC ACCESS RIGHT: IMG2390

## 2023-03-30 HISTORY — PX: IR ANGIO INTRA EXTRACRAN SEL INTERNAL CAROTID UNI L MOD SED: IMG5361

## 2023-03-30 LAB — CBC WITH DIFFERENTIAL/PLATELET
Abs Immature Granulocytes: 0.05 10*3/uL (ref 0.00–0.07)
Basophils Absolute: 0.1 10*3/uL (ref 0.0–0.1)
Basophils Relative: 1 %
Eosinophils Absolute: 0.4 10*3/uL (ref 0.0–0.5)
Eosinophils Relative: 6 %
HCT: 46 % (ref 39.0–52.0)
Hemoglobin: 14.6 g/dL (ref 13.0–17.0)
Immature Granulocytes: 1 %
Lymphocytes Relative: 31 %
Lymphs Abs: 2.3 10*3/uL (ref 0.7–4.0)
MCH: 31.1 pg (ref 26.0–34.0)
MCHC: 31.7 g/dL (ref 30.0–36.0)
MCV: 97.9 fL (ref 80.0–100.0)
Monocytes Absolute: 0.9 10*3/uL (ref 0.1–1.0)
Monocytes Relative: 12 %
Neutro Abs: 3.9 10*3/uL (ref 1.7–7.7)
Neutrophils Relative %: 49 %
Platelets: 215 10*3/uL (ref 150–400)
RBC: 4.7 MIL/uL (ref 4.22–5.81)
RDW: 13.7 % (ref 11.5–15.5)
WBC: 7.7 10*3/uL (ref 4.0–10.5)
nRBC: 0 % (ref 0.0–0.2)

## 2023-03-30 LAB — BASIC METABOLIC PANEL
Anion gap: 11 (ref 5–15)
BUN: 25 mg/dL — ABNORMAL HIGH (ref 8–23)
CO2: 24 mmol/L (ref 22–32)
Calcium: 10.8 mg/dL — ABNORMAL HIGH (ref 8.9–10.3)
Chloride: 105 mmol/L (ref 98–111)
Creatinine, Ser: 1.57 mg/dL — ABNORMAL HIGH (ref 0.61–1.24)
GFR, Estimated: 48 mL/min — ABNORMAL LOW (ref >60)
Glucose, Bld: 97 mg/dL (ref 70–99)
Potassium: 4.6 mmol/L (ref 3.5–5.1)
Sodium: 140 mmol/L (ref 135–145)

## 2023-03-30 LAB — PROTIME-INR
INR: 1 (ref 0.8–1.2)
Prothrombin Time: 13.5 seconds (ref 11.4–15.2)

## 2023-03-30 MED ORDER — HYDROCODONE-ACETAMINOPHEN 5-325 MG PO TABS
1.0000 | ORAL_TABLET | ORAL | Status: DC | PRN
  Administered 2023-03-30: 2 via ORAL
  Filled 2023-03-30: qty 2

## 2023-03-30 MED ORDER — FENTANYL CITRATE (PF) 100 MCG/2ML IJ SOLN
INTRAMUSCULAR | Status: AC | PRN
  Administered 2023-03-30: 25 ug via INTRAVENOUS

## 2023-03-30 MED ORDER — HEPARIN SODIUM (PORCINE) 1000 UNIT/ML IJ SOLN
INTRAMUSCULAR | Status: AC
  Filled 2023-03-30: qty 10

## 2023-03-30 MED ORDER — MIDAZOLAM HCL 2 MG/2ML IJ SOLN
INTRAMUSCULAR | Status: AC | PRN
  Administered 2023-03-30: 1 mg via INTRAVENOUS

## 2023-03-30 MED ORDER — MIDAZOLAM HCL 2 MG/2ML IJ SOLN
INTRAMUSCULAR | Status: AC
  Filled 2023-03-30: qty 2

## 2023-03-30 MED ORDER — SODIUM CHLORIDE 0.9 % IV SOLN: INTRAVENOUS | Status: DC

## 2023-03-30 MED ORDER — IOHEXOL 300 MG/ML  SOLN
100.0000 mL | Freq: Once | INTRAMUSCULAR | Status: AC | PRN
  Administered 2023-03-30: 30 mL via INTRA_ARTERIAL

## 2023-03-30 MED ORDER — HEPARIN SODIUM (PORCINE) 1000 UNIT/ML IJ SOLN
INTRAMUSCULAR | Status: AC | PRN
  Administered 2023-03-30: 3000 [IU] via INTRAVENOUS

## 2023-03-30 MED ORDER — NITROGLYCERIN 1 MG/10 ML FOR IR/CATH LAB
INTRA_ARTERIAL | Status: AC
  Filled 2023-03-30: qty 10

## 2023-03-30 MED ORDER — LIDOCAINE HCL 1 % IJ SOLN
INTRAMUSCULAR | Status: AC
  Filled 2023-03-30: qty 20

## 2023-03-30 MED ORDER — CEFAZOLIN SODIUM-DEXTROSE 2-4 GM/100ML-% IV SOLN: 2.0000 g | INTRAVENOUS | Status: DC

## 2023-03-30 MED ORDER — FENTANYL CITRATE (PF) 100 MCG/2ML IJ SOLN
INTRAMUSCULAR | Status: AC
  Filled 2023-03-30: qty 2

## 2023-03-30 MED ORDER — VERAPAMIL HCL 2.5 MG/ML IV SOLN: INTRA_ARTERIAL | Status: AC | PRN

## 2023-03-30 MED ORDER — VERAPAMIL HCL 2.5 MG/ML IV SOLN
INTRAVENOUS | Status: AC
  Filled 2023-03-30: qty 2

## 2023-03-30 NOTE — Op Note (Signed)
  NEUROSURGERY BRIEF OPERATIVE  NOTE   PREOP DX: Aneurysm  POSTOP DX: Same  PROCEDURE: Diagnostic cerebral angiogram  SURGEON: Dr. Lisbeth Renshaw, MD  ANESTHESIA: IV Sedation with Local  APPROACH: Right trans-radial  EBL: Minimal  SPECIMENS: None  COMPLICATIONS: None  CONDITION: Stable to recovery  FINDINGS (Full report in [facility]): 1. ~56mm Left A1-A2 bifurcation aneurysm with dominant left A1 giving rise to bilateral A2.   Lisbeth Renshaw, MD [facility]

## 2023-03-30 NOTE — Discharge Instructions (Signed)
MAY RESUME ELIQUIS TOMORROW

## 2023-03-30 NOTE — Progress Notes (Signed)
Client states "I need Dr to write me a prescription for pain medication for home" Dr Conchita Paris notified of client c/o pain 8/10 headache, 6/10 back pain and 5/10 right radial pain and pain med given; no new orders
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
## 2023-04-09 ENCOUNTER — Ambulatory Visit: Payer: Medicare Other | Admitting: Cardiology

## 2023-04-11 DIAGNOSIS — G4733 Obstructive sleep apnea (adult) (pediatric): Secondary | ICD-10-CM | POA: Diagnosis not present

## 2023-04-12 ENCOUNTER — Encounter: Payer: Self-pay | Admitting: Cardiology

## 2023-04-12 ENCOUNTER — Ambulatory Visit: Payer: Medicare Other | Attending: Cardiology | Admitting: Cardiology

## 2023-04-12 ENCOUNTER — Other Ambulatory Visit (HOSPITAL_COMMUNITY): Payer: Self-pay

## 2023-04-12 VITALS — BP 108/59 | HR 72 | Ht 73.0 in | Wt 271.0 lb

## 2023-04-12 DIAGNOSIS — I255 Ischemic cardiomyopathy: Secondary | ICD-10-CM

## 2023-04-12 DIAGNOSIS — I4821 Permanent atrial fibrillation: Secondary | ICD-10-CM

## 2023-04-12 DIAGNOSIS — I5022 Chronic systolic (congestive) heart failure: Secondary | ICD-10-CM

## 2023-04-12 DIAGNOSIS — I2510 Atherosclerotic heart disease of native coronary artery without angina pectoris: Secondary | ICD-10-CM | POA: Diagnosis not present

## 2023-04-12 DIAGNOSIS — I472 Ventricular tachycardia, unspecified: Secondary | ICD-10-CM

## 2023-04-12 DIAGNOSIS — I671 Cerebral aneurysm, nonruptured: Secondary | ICD-10-CM | POA: Diagnosis not present

## 2023-04-12 MED ORDER — PANTOPRAZOLE SODIUM 40 MG PO TBEC
40.0000 mg | DELAYED_RELEASE_TABLET | Freq: Every day | ORAL | 1 refills | Status: DC
  Filled 2023-04-12: qty 30, 30d supply, fill #0
  Filled 2023-05-03 – 2023-05-05 (×2): qty 30, 30d supply, fill #1

## 2023-04-12 NOTE — Patient Instructions (Signed)
Medication Instructions:  Your physician recommends that you continue on your current medications as directed. Please refer to the Current Medication list given to you today.  *If you need a refill on your cardiac medications before your next appointment, please call your pharmacy*  Follow-Up: At  [facility], you and your health needs are our priority.  As part of our continuing mission to provide you with exceptional heart care, we have created designated Provider Care Teams.  These Care Teams include your primary Cardiologist (physician) and Advanced Practice Providers (APPs -  Physician Assistants and Nurse Practitioners) who all work together to provide you with the care you need, when you need it.  Your next appointment:   6 month(s)  Provider:   Tessa Conte, PA-C, Dayna Dunn, PA-C, Ernest Dick, NP, Michele Lenze, PA-C, Michelle Swinyer, NP, or Scott Weaver, PA-C     

## 2023-04-12 NOTE — Progress Notes (Signed)
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  Your physician recommends that you continue on your current medications as directed. Please refer to the Current Medication list given to you today.  *If you need a refill on your cardiac medications before your next appointment, please call your pharmacy*  Follow-Up: At [facility], you and your health needs are our priority.  As part of our continuing mission to provide you with exceptional heart care, we have created designated Provider Care Teams.  These Care Teams include your primary Cardiologist (physician) and Advanced Practice Providers (APPs -  Physician Assistants and Nurse Practitioners) who all work together to provide you with the care you need, when you need it.  Your next appointment:   6 months  Provider:   Jari Favre, PA-C, Ronie Spies, PA-C, Robin Searing, NP, Jacolyn Reedy, PA-C, Eligha Bridegroom, NP, or Tereso Newcomer, PA-C            Signed, Donato Schultz, MD  04/12/2023 5:19 PM     [facility]

## 2023-04-14 ENCOUNTER — Other Ambulatory Visit (HOSPITAL_COMMUNITY): Payer: Self-pay

## 2023-04-16 ENCOUNTER — Ambulatory Visit: Payer: Medicare Other | Admitting: Cardiology

## 2023-05-03 ENCOUNTER — Other Ambulatory Visit (HOSPITAL_COMMUNITY): Payer: Self-pay

## 2023-05-10 ENCOUNTER — Other Ambulatory Visit (HOSPITAL_COMMUNITY): Payer: Self-pay

## 2023-05-11 DIAGNOSIS — G4733 Obstructive sleep apnea (adult) (pediatric): Secondary | ICD-10-CM | POA: Diagnosis not present

## 2023-05-21 DIAGNOSIS — I5042 Chronic combined systolic (congestive) and diastolic (congestive) heart failure: Secondary | ICD-10-CM | POA: Diagnosis not present

## 2023-05-21 DIAGNOSIS — N1831 Chronic kidney disease, stage 3a: Secondary | ICD-10-CM | POA: Diagnosis not present

## 2023-05-21 DIAGNOSIS — E1140 Type 2 diabetes mellitus with diabetic neuropathy, unspecified: Secondary | ICD-10-CM | POA: Diagnosis not present

## 2023-05-21 DIAGNOSIS — E7800 Pure hypercholesterolemia, unspecified: Secondary | ICD-10-CM | POA: Diagnosis not present

## 2023-05-21 DIAGNOSIS — G8929 Other chronic pain: Secondary | ICD-10-CM | POA: Diagnosis not present

## 2023-05-21 DIAGNOSIS — I10 Essential (primary) hypertension: Secondary | ICD-10-CM | POA: Diagnosis not present

## 2023-05-21 DIAGNOSIS — J449 Chronic obstructive pulmonary disease, unspecified: Secondary | ICD-10-CM | POA: Diagnosis not present

## 2023-06-04 ENCOUNTER — Other Ambulatory Visit (HOSPITAL_COMMUNITY): Payer: Self-pay

## 2023-06-04 DIAGNOSIS — E038 Other specified hypothyroidism: Secondary | ICD-10-CM | POA: Diagnosis not present

## 2023-06-04 MED ORDER — PANTOPRAZOLE SODIUM 40 MG PO TBEC
40.0000 mg | DELAYED_RELEASE_TABLET | Freq: Every day | ORAL | 1 refills | Status: DC
  Filled 2023-06-04: qty 30, 30d supply, fill #0

## 2023-06-07 ENCOUNTER — Other Ambulatory Visit: Payer: Self-pay

## 2023-06-09 ENCOUNTER — Other Ambulatory Visit (HOSPITAL_COMMUNITY): Payer: Self-pay

## 2023-06-10 ENCOUNTER — Inpatient Hospital Stay (HOSPITAL_COMMUNITY): Payer: Medicare Other

## 2023-06-10 ENCOUNTER — Emergency Department (HOSPITAL_COMMUNITY): Payer: Medicare Other

## 2023-06-10 ENCOUNTER — Inpatient Hospital Stay (HOSPITAL_COMMUNITY)
Admission: EM | Admit: 2023-06-10 | Discharge: 2023-06-21 | DRG: 064 | Disposition: E | Payer: Medicare Other | Attending: Emergency Medicine | Admitting: Emergency Medicine

## 2023-06-10 ENCOUNTER — Other Ambulatory Visit: Payer: Self-pay

## 2023-06-10 ENCOUNTER — Encounter (HOSPITAL_COMMUNITY): Payer: Self-pay

## 2023-06-10 DIAGNOSIS — I468 Cardiac arrest due to other underlying condition: Secondary | ICD-10-CM | POA: Diagnosis not present

## 2023-06-10 DIAGNOSIS — K7200 Acute and subacute hepatic failure without coma: Secondary | ICD-10-CM | POA: Diagnosis not present

## 2023-06-10 DIAGNOSIS — I2510 Atherosclerotic heart disease of native coronary artery without angina pectoris: Secondary | ICD-10-CM | POA: Diagnosis not present

## 2023-06-10 DIAGNOSIS — G4733 Obstructive sleep apnea (adult) (pediatric): Secondary | ICD-10-CM | POA: Diagnosis present

## 2023-06-10 DIAGNOSIS — R570 Cardiogenic shock: Secondary | ICD-10-CM | POA: Diagnosis not present

## 2023-06-10 DIAGNOSIS — E1142 Type 2 diabetes mellitus with diabetic polyneuropathy: Secondary | ICD-10-CM | POA: Diagnosis not present

## 2023-06-10 DIAGNOSIS — E785 Hyperlipidemia, unspecified: Secondary | ICD-10-CM | POA: Diagnosis present

## 2023-06-10 DIAGNOSIS — Z825 Family history of asthma and other chronic lower respiratory diseases: Secondary | ICD-10-CM

## 2023-06-10 DIAGNOSIS — S066X9A Traumatic subarachnoid hemorrhage with loss of consciousness of unspecified duration, initial encounter: Secondary | ICD-10-CM

## 2023-06-10 DIAGNOSIS — F32A Depression, unspecified: Secondary | ICD-10-CM | POA: Diagnosis present

## 2023-06-10 DIAGNOSIS — Z6835 Body mass index (BMI) 35.0-35.9, adult: Secondary | ICD-10-CM

## 2023-06-10 DIAGNOSIS — G919 Hydrocephalus, unspecified: Secondary | ICD-10-CM | POA: Diagnosis not present

## 2023-06-10 DIAGNOSIS — Z515 Encounter for palliative care: Secondary | ICD-10-CM

## 2023-06-10 DIAGNOSIS — Z95810 Presence of automatic (implantable) cardiac defibrillator: Secondary | ICD-10-CM

## 2023-06-10 DIAGNOSIS — G918 Other hydrocephalus: Secondary | ICD-10-CM | POA: Diagnosis not present

## 2023-06-10 DIAGNOSIS — J449 Chronic obstructive pulmonary disease, unspecified: Secondary | ICD-10-CM | POA: Diagnosis not present

## 2023-06-10 DIAGNOSIS — J9601 Acute respiratory failure with hypoxia: Secondary | ICD-10-CM | POA: Diagnosis not present

## 2023-06-10 DIAGNOSIS — Z813 Family history of other psychoactive substance abuse and dependence: Secondary | ICD-10-CM

## 2023-06-10 DIAGNOSIS — E119 Type 2 diabetes mellitus without complications: Secondary | ICD-10-CM | POA: Diagnosis present

## 2023-06-10 DIAGNOSIS — G931 Anoxic brain damage, not elsewhere classified: Secondary | ICD-10-CM | POA: Diagnosis present

## 2023-06-10 DIAGNOSIS — N170 Acute kidney failure with tubular necrosis: Secondary | ICD-10-CM | POA: Diagnosis not present

## 2023-06-10 DIAGNOSIS — J9600 Acute respiratory failure, unspecified whether with hypoxia or hypercapnia: Secondary | ICD-10-CM | POA: Diagnosis present

## 2023-06-10 DIAGNOSIS — Z4659 Encounter for fitting and adjustment of other gastrointestinal appliance and device: Secondary | ICD-10-CM | POA: Diagnosis not present

## 2023-06-10 DIAGNOSIS — J811 Chronic pulmonary edema: Secondary | ICD-10-CM | POA: Diagnosis not present

## 2023-06-10 DIAGNOSIS — R6889 Other general symptoms and signs: Secondary | ICD-10-CM | POA: Diagnosis not present

## 2023-06-10 DIAGNOSIS — I602 Nontraumatic subarachnoid hemorrhage from anterior communicating artery: Principal | ICD-10-CM | POA: Diagnosis present

## 2023-06-10 DIAGNOSIS — G9340 Encephalopathy, unspecified: Secondary | ICD-10-CM | POA: Diagnosis present

## 2023-06-10 DIAGNOSIS — Z7951 Long term (current) use of inhaled steroids: Secondary | ICD-10-CM

## 2023-06-10 DIAGNOSIS — Z87891 Personal history of nicotine dependence: Secondary | ICD-10-CM

## 2023-06-10 DIAGNOSIS — I4821 Permanent atrial fibrillation: Secondary | ICD-10-CM | POA: Diagnosis present

## 2023-06-10 DIAGNOSIS — I4901 Ventricular fibrillation: Secondary | ICD-10-CM | POA: Diagnosis not present

## 2023-06-10 DIAGNOSIS — I5042 Chronic combined systolic (congestive) and diastolic (congestive) heart failure: Secondary | ICD-10-CM | POA: Diagnosis present

## 2023-06-10 DIAGNOSIS — I469 Cardiac arrest, cause unspecified: Secondary | ICD-10-CM | POA: Diagnosis not present

## 2023-06-10 DIAGNOSIS — Z7901 Long term (current) use of anticoagulants: Secondary | ICD-10-CM

## 2023-06-10 DIAGNOSIS — I615 Nontraumatic intracerebral hemorrhage, intraventricular: Secondary | ICD-10-CM | POA: Diagnosis not present

## 2023-06-10 DIAGNOSIS — Z79899 Other long term (current) drug therapy: Secondary | ICD-10-CM

## 2023-06-10 DIAGNOSIS — I10 Essential (primary) hypertension: Secondary | ICD-10-CM | POA: Diagnosis present

## 2023-06-10 DIAGNOSIS — Z66 Do not resuscitate: Secondary | ICD-10-CM | POA: Diagnosis not present

## 2023-06-10 DIAGNOSIS — Z7982 Long term (current) use of aspirin: Secondary | ICD-10-CM

## 2023-06-10 DIAGNOSIS — I4891 Unspecified atrial fibrillation: Secondary | ICD-10-CM | POA: Diagnosis present

## 2023-06-10 DIAGNOSIS — G9341 Metabolic encephalopathy: Secondary | ICD-10-CM | POA: Diagnosis present

## 2023-06-10 DIAGNOSIS — S066XAA Traumatic subarachnoid hemorrhage with loss of consciousness status unknown, initial encounter: Secondary | ICD-10-CM | POA: Diagnosis present

## 2023-06-10 DIAGNOSIS — I255 Ischemic cardiomyopathy: Secondary | ICD-10-CM | POA: Diagnosis not present

## 2023-06-10 DIAGNOSIS — I110 Hypertensive heart disease with heart failure: Secondary | ICD-10-CM | POA: Diagnosis present

## 2023-06-10 DIAGNOSIS — I25810 Atherosclerosis of coronary artery bypass graft(s) without angina pectoris: Secondary | ICD-10-CM | POA: Diagnosis present

## 2023-06-10 DIAGNOSIS — R531 Weakness: Secondary | ICD-10-CM | POA: Diagnosis not present

## 2023-06-10 DIAGNOSIS — Z800 Family history of malignant neoplasm of digestive organs: Secondary | ICD-10-CM

## 2023-06-10 DIAGNOSIS — G4489 Other headache syndrome: Secondary | ICD-10-CM | POA: Diagnosis not present

## 2023-06-10 DIAGNOSIS — Z452 Encounter for adjustment and management of vascular access device: Secondary | ICD-10-CM | POA: Diagnosis not present

## 2023-06-10 DIAGNOSIS — N179 Acute kidney failure, unspecified: Secondary | ICD-10-CM | POA: Diagnosis not present

## 2023-06-10 DIAGNOSIS — I618 Other nontraumatic intracerebral hemorrhage: Secondary | ICD-10-CM | POA: Diagnosis not present

## 2023-06-10 DIAGNOSIS — I25709 Atherosclerosis of coronary artery bypass graft(s), unspecified, with unspecified angina pectoris: Secondary | ICD-10-CM | POA: Diagnosis present

## 2023-06-10 LAB — CBC
HCT: 48 % (ref 39.0–52.0)
Hemoglobin: 14.9 g/dL (ref 13.0–17.0)
MCH: 30.4 pg (ref 26.0–34.0)
MCHC: 31 g/dL (ref 30.0–36.0)
MCV: 98 fL (ref 80.0–100.0)
Platelets: 237 10*3/uL (ref 150–400)
RBC: 4.9 MIL/uL (ref 4.22–5.81)
RDW: 13.1 % (ref 11.5–15.5)
WBC: 22.1 10*3/uL — ABNORMAL HIGH (ref 4.0–10.5)
nRBC: 0 % (ref 0.0–0.2)

## 2023-06-10 LAB — I-STAT ARTERIAL BLOOD GAS, ED
Acid-base deficit: 9 mmol/L — ABNORMAL HIGH (ref 0.0–2.0)
Bicarbonate: 20.6 mmol/L (ref 20.0–28.0)
Calcium, Ion: 1.33 mmol/L (ref 1.15–1.40)
HCT: 43 % (ref 39.0–52.0)
Hemoglobin: 14.6 g/dL (ref 13.0–17.0)
O2 Saturation: 88 %
Patient temperature: 36.6
Potassium: 3.5 mmol/L (ref 3.5–5.1)
Sodium: 142 mmol/L (ref 135–145)
TCO2: 22 mmol/L (ref 22–32)
pCO2 arterial: 56.6 mmHg — ABNORMAL HIGH (ref 32–48)
pH, Arterial: 7.167 — CL (ref 7.35–7.45)
pO2, Arterial: 67 mmHg — ABNORMAL LOW (ref 83–108)

## 2023-06-10 LAB — I-STAT CHEM 8, ED
BUN: 21 mg/dL (ref 8–23)
Calcium, Ion: 1.32 mmol/L (ref 1.15–1.40)
Chloride: 111 mmol/L (ref 98–111)
Creatinine, Ser: 1.5 mg/dL — ABNORMAL HIGH (ref 0.61–1.24)
Glucose, Bld: 124 mg/dL — ABNORMAL HIGH (ref 70–99)
HCT: 46 % (ref 39.0–52.0)
Hemoglobin: 15.6 g/dL (ref 13.0–17.0)
Potassium: 3.9 mmol/L (ref 3.5–5.1)
Sodium: 142 mmol/L (ref 135–145)
TCO2: 22 mmol/L (ref 22–32)

## 2023-06-10 LAB — URINALYSIS, ROUTINE W REFLEX MICROSCOPIC
Bilirubin Urine: NEGATIVE
Glucose, UA: >=500 mg/dL — AB
Hgb urine dipstick: NEGATIVE
Ketones, ur: NEGATIVE mg/dL
Leukocytes,Ua: NEGATIVE
Nitrite: NEGATIVE
Protein, ur: NEGATIVE mg/dL
Specific Gravity, Urine: 1.011 (ref 1.005–1.030)
pH: 5 (ref 5.0–8.0)

## 2023-06-10 LAB — COMPREHENSIVE METABOLIC PANEL
ALT: 33 U/L (ref 0–44)
ALT: 132 U/L — ABNORMAL HIGH (ref 0–44)
AST: 27 U/L (ref 15–41)
AST: 128 U/L — ABNORMAL HIGH (ref 15–41)
Albumin: 3.4 g/dL — ABNORMAL LOW (ref 3.5–5.0)
Albumin: 3 g/dL — ABNORMAL LOW (ref 3.5–5.0)
Alkaline Phosphatase: 85 U/L (ref 38–126)
Alkaline Phosphatase: 90 U/L (ref 38–126)
Anion gap: 10 (ref 5–15)
Anion gap: 12 (ref 5–15)
BUN: 19 mg/dL (ref 8–23)
BUN: 27 mg/dL — ABNORMAL HIGH (ref 8–23)
CO2: 21 mmol/L — ABNORMAL LOW (ref 22–32)
CO2: 18 mmol/L — ABNORMAL LOW (ref 22–32)
Calcium: 9.6 mg/dL (ref 8.9–10.3)
Calcium: 9.2 mg/dL (ref 8.9–10.3)
Chloride: 110 mmol/L (ref 98–111)
Chloride: 110 mmol/L (ref 98–111)
Creatinine, Ser: 1.49 mg/dL — ABNORMAL HIGH (ref 0.61–1.24)
Creatinine, Ser: 1.96 mg/dL — ABNORMAL HIGH (ref 0.61–1.24)
GFR, Estimated: 51 mL/min — ABNORMAL LOW (ref >60)
GFR, Estimated: 37 mL/min — ABNORMAL LOW (ref >60)
Glucose, Bld: 126 mg/dL — ABNORMAL HIGH (ref 70–99)
Glucose, Bld: 293 mg/dL — ABNORMAL HIGH (ref 70–99)
Potassium: 3.9 mmol/L (ref 3.5–5.1)
Potassium: 4 mmol/L (ref 3.5–5.1)
Sodium: 141 mmol/L (ref 135–145)
Sodium: 140 mmol/L (ref 135–145)
Total Bilirubin: 0.8 mg/dL (ref 0.3–1.2)
Total Bilirubin: 1.2 mg/dL (ref 0.3–1.2)
Total Protein: 6 g/dL — ABNORMAL LOW (ref 6.5–8.1)
Total Protein: 5.6 g/dL — ABNORMAL LOW (ref 6.5–8.1)

## 2023-06-10 LAB — CBC WITH DIFFERENTIAL/PLATELET
Abs Immature Granulocytes: 0.05 10*3/uL (ref 0.00–0.07)
Basophils Absolute: 0.1 10*3/uL (ref 0.0–0.1)
Basophils Relative: 1 %
Eosinophils Absolute: 0.4 10*3/uL (ref 0.0–0.5)
Eosinophils Relative: 4 %
HCT: 47.1 % (ref 39.0–52.0)
Hemoglobin: 14.7 g/dL (ref 13.0–17.0)
Immature Granulocytes: 0 %
Lymphocytes Relative: 20 %
Lymphs Abs: 2.3 10*3/uL (ref 0.7–4.0)
MCH: 30.6 pg (ref 26.0–34.0)
MCHC: 31.2 g/dL (ref 30.0–36.0)
MCV: 97.9 fL (ref 80.0–100.0)
Monocytes Absolute: 1 10*3/uL (ref 0.1–1.0)
Monocytes Relative: 8 %
Neutro Abs: 7.7 10*3/uL (ref 1.7–7.7)
Neutrophils Relative %: 67 %
Platelets: 232 10*3/uL (ref 150–400)
RBC: 4.81 MIL/uL (ref 4.22–5.81)
RDW: 12.9 % (ref 11.5–15.5)
WBC: 11.5 10*3/uL — ABNORMAL HIGH (ref 4.0–10.5)
nRBC: 0 % (ref 0.0–0.2)

## 2023-06-10 LAB — I-STAT VENOUS BLOOD GAS, ED
Acid-base deficit: 4 mmol/L — ABNORMAL HIGH (ref 0.0–2.0)
Bicarbonate: 21.6 mmol/L (ref 20.0–28.0)
Calcium, Ion: 1.26 mmol/L (ref 1.15–1.40)
HCT: 44 % (ref 39.0–52.0)
Hemoglobin: 15 g/dL (ref 13.0–17.0)
O2 Saturation: 95 %
Potassium: 3.9 mmol/L (ref 3.5–5.1)
Sodium: 142 mmol/L (ref 135–145)
TCO2: 23 mmol/L (ref 22–32)
pCO2, Ven: 39.8 mmHg — ABNORMAL LOW (ref 44–60)
pH, Ven: 7.343 (ref 7.25–7.43)
pO2, Ven: 79 mmHg — ABNORMAL HIGH (ref 32–45)

## 2023-06-10 LAB — HEMOGLOBIN A1C
Hgb A1c MFr Bld: 5.2 % (ref 4.8–5.6)
Mean Plasma Glucose: 102.54 mg/dL

## 2023-06-10 LAB — PROTIME-INR
INR: 1.3 — ABNORMAL HIGH (ref 0.8–1.2)
Prothrombin Time: 16.2 seconds — ABNORMAL HIGH (ref 11.4–15.2)

## 2023-06-10 LAB — TROPONIN I (HIGH SENSITIVITY): Troponin I (High Sensitivity): 1838 ng/L (ref <18)

## 2023-06-10 LAB — GLUCOSE, CAPILLARY
Glucose-Capillary: 228 mg/dL — ABNORMAL HIGH (ref 70–99)
Glucose-Capillary: 240 mg/dL — ABNORMAL HIGH (ref 70–99)

## 2023-06-10 LAB — LACTIC ACID, PLASMA
Lactic Acid, Venous: 1.2 mmol/L (ref 0.5–1.9)
Lactic Acid, Venous: 3.1 mmol/L (ref 0.5–1.9)

## 2023-06-10 LAB — MAGNESIUM: Magnesium: 1.9 mg/dL (ref 1.7–2.4)

## 2023-06-10 LAB — CULTURE, RESPIRATORY W GRAM STAIN

## 2023-06-10 LAB — MRSA NEXT GEN BY PCR, NASAL: MRSA by PCR Next Gen: NOT DETECTED

## 2023-06-10 LAB — PROCALCITONIN: Procalcitonin: 0.53 ng/mL

## 2023-06-10 MED ORDER — EPINEPHRINE 1 MG/10ML IJ SOSY
PREFILLED_SYRINGE | INTRAMUSCULAR | Status: AC | PRN
  Administered 2023-06-10 (×2): 1 mg via INTRAVENOUS

## 2023-06-10 MED ORDER — ETOMIDATE 2 MG/ML IV SOLN
INTRAVENOUS | Status: AC | PRN
  Administered 2023-06-10: 20 mg via INTRAVENOUS

## 2023-06-10 MED ORDER — BUDESONIDE 0.5 MG/2ML IN SUSP
0.5000 mg | Freq: Two times a day (BID) | RESPIRATORY_TRACT | Status: DC
  Administered 2023-06-10 – 2023-06-11 (×3): 0.5 mg via RESPIRATORY_TRACT
  Filled 2023-06-10 (×3): qty 2

## 2023-06-10 MED ORDER — ORAL CARE MOUTH RINSE
15.0000 mL | OROMUCOSAL | Status: DC
  Administered 2023-06-10 – 2023-06-11 (×12): 15 mL via OROMUCOSAL

## 2023-06-10 MED ORDER — NOREPINEPHRINE 4 MG/250ML-% IV SOLN
INTRAVENOUS | Status: AC
  Administered 2023-06-10: 2 ug/min via INTRAVENOUS
  Filled 2023-06-10: qty 250

## 2023-06-10 MED ORDER — EPINEPHRINE 1 MG/10ML IJ SOSY
PREFILLED_SYRINGE | INTRAMUSCULAR | Status: DC | PRN
  Administered 2023-06-10 (×8): 1 mg via INTRAVENOUS

## 2023-06-10 MED ORDER — IPRATROPIUM-ALBUTEROL 0.5-2.5 (3) MG/3ML IN SOLN: 3.0000 mL | RESPIRATORY_TRACT | Status: DC | PRN

## 2023-06-10 MED ORDER — INSULIN ASPART 100 UNIT/ML IJ SOLN
0.0000 [IU] | INTRAMUSCULAR | Status: DC
  Administered 2023-06-10 – 2023-06-11 (×3): 2 [IU] via SUBCUTANEOUS

## 2023-06-10 MED ORDER — EMPTY CONTAINERS FLEXIBLE MISC: 1800.0000 mg | Freq: Once | Status: DC

## 2023-06-10 MED ORDER — PANTOPRAZOLE SODIUM 40 MG IV SOLR
40.0000 mg | Freq: Every day | INTRAVENOUS | Status: DC
  Administered 2023-06-10 – 2023-06-11 (×2): 40 mg via INTRAVENOUS
  Filled 2023-06-10 (×2): qty 10

## 2023-06-10 MED ORDER — EPINEPHRINE 1 MG/10ML IJ SOSY
PREFILLED_SYRINGE | INTRAMUSCULAR | Status: AC | PRN
  Administered 2023-06-10 (×3): 1 mg via INTRAVENOUS

## 2023-06-10 MED ORDER — CLEVIDIPINE BUTYRATE 0.5 MG/ML IV EMUL
0.0000 mg/h | INTRAVENOUS | Status: DC
  Administered 2023-06-10 – 2023-06-11 (×2): 2 mg/h via INTRAVENOUS
  Filled 2023-06-10: qty 50
  Filled 2023-06-10: qty 100

## 2023-06-10 MED ORDER — PROTHROMBIN COMPLEX CONC HUMAN 500 UNITS IV KIT: 50.0000 [IU]/kg | PACK | Status: DC

## 2023-06-10 MED ORDER — DOCUSATE SODIUM 50 MG/5ML PO LIQD
100.0000 mg | Freq: Two times a day (BID) | ORAL | Status: DC
  Administered 2023-06-11: 100 mg
  Filled 2023-06-10: qty 10

## 2023-06-10 MED ORDER — PROPOFOL 1000 MG/100ML IV EMUL
5.0000 ug/kg/min | INTRAVENOUS | Status: DC
  Administered 2023-06-10: 5 ug/kg/min via INTRAVENOUS

## 2023-06-10 MED ORDER — FAMOTIDINE 20 MG PO TABS
20.0000 mg | ORAL_TABLET | Freq: Two times a day (BID) | ORAL | Status: DC
  Administered 2023-06-11 (×2): 20 mg
  Filled 2023-06-10 (×2): qty 1

## 2023-06-10 MED ORDER — PROTHROMBIN COMPLEX CONC HUMAN 500 UNITS IV KIT
4810.0000 [IU] | PACK | Status: AC
  Administered 2023-06-10: 4810 [IU] via INTRAVENOUS
  Filled 2023-06-10: qty 4810

## 2023-06-10 MED ORDER — ARFORMOTEROL TARTRATE 15 MCG/2ML IN NEBU
15.0000 ug | INHALATION_SOLUTION | Freq: Two times a day (BID) | RESPIRATORY_TRACT | Status: DC
  Administered 2023-06-10 – 2023-06-11 (×3): 15 ug via RESPIRATORY_TRACT
  Filled 2023-06-10 (×3): qty 2

## 2023-06-10 MED ORDER — EPINEPHRINE HCL 5 MG/250ML IV SOLN IN NS
0.5000 ug/min | INTRAVENOUS | Status: DC
  Administered 2023-06-10 (×2): 20 ug/min via INTRAVENOUS
  Administered 2023-06-10: 0.5 ug/min via INTRAVENOUS
  Filled 2023-06-10 (×2): qty 250

## 2023-06-10 MED ORDER — LEVETIRACETAM IN NACL 500 MG/100ML IV SOLN
500.0000 mg | Freq: Two times a day (BID) | INTRAVENOUS | Status: DC
  Administered 2023-06-10 – 2023-06-11 (×3): 500 mg via INTRAVENOUS
  Filled 2023-06-10 (×3): qty 100

## 2023-06-10 MED ORDER — SODIUM CHLORIDE 0.9 % IV SOLN
INTRAVENOUS | Status: AC | PRN
  Administered 2023-06-10 (×2): 1000 mL via INTRAVENOUS

## 2023-06-10 MED ORDER — ORAL CARE MOUTH RINSE: 15.0000 mL | OROMUCOSAL | Status: DC | PRN

## 2023-06-10 MED ORDER — ROCURONIUM BROMIDE 50 MG/5ML IV SOLN
INTRAVENOUS | Status: AC | PRN
  Administered 2023-06-10: 100 mg via INTRAVENOUS

## 2023-06-10 MED ORDER — FENTANYL CITRATE PF 50 MCG/ML IJ SOSY: 25.0000 ug | PREFILLED_SYRINGE | INTRAMUSCULAR | Status: DC | PRN

## 2023-06-10 MED ORDER — POLYETHYLENE GLYCOL 3350 17 G PO PACK
17.0000 g | PACK | Freq: Every day | ORAL | Status: DC
  Administered 2023-06-11: 17 g
  Filled 2023-06-10: qty 1

## 2023-06-10 MED ORDER — SODIUM BICARBONATE 8.4 % IV SOLN
INTRAVENOUS | Status: AC | PRN
  Administered 2023-06-10: 50 meq via INTRAVENOUS

## 2023-06-10 MED ORDER — VASOPRESSIN 20 UNITS/100 ML INFUSION FOR SHOCK
0.0000 [IU]/min | INTRAVENOUS | Status: DC
  Administered 2023-06-10: 0.03 [IU]/min via INTRAVENOUS
  Filled 2023-06-10: qty 100

## 2023-06-10 MED ORDER — NOREPINEPHRINE 4 MG/250ML-% IV SOLN
0.0000 ug/min | INTRAVENOUS | Status: DC
  Administered 2023-06-10: 30 ug/min via INTRAVENOUS
  Administered 2023-06-10 (×2): 40 ug/min via INTRAVENOUS
  Administered 2023-06-11: 15 ug/min via INTRAVENOUS
  Administered 2023-06-11: 34.667 ug/min via INTRAVENOUS
  Administered 2023-06-11: 2 ug/min via INTRAVENOUS
  Filled 2023-06-10 (×2): qty 500
  Filled 2023-06-10: qty 250

## 2023-06-10 NOTE — Progress Notes (Signed)
Came bedside for "stat" echo a second time and patient is still unavailable at this time. Please contact operator for after hours cardiologist if echo is still needed this evening.

## 2023-06-10 NOTE — ED Notes (Signed)
Pt intubated

## 2023-06-10 NOTE — ED Provider Notes (Signed)
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 of: paced rhythm   Social Determinants of Health:  Diagnosis or treatment significantly limited by social determinants of health: obesity   Reevaluation: After the interventions noted above, I reevaluated the patient and found that their symptoms have stayed the same  Co morbidities that complicate the patient evaluation  Past Medical History:  Diagnosis Date   Anxiety    Arthritis    "all over my body"   Atrial fibrillation (HCC)    rate control strategy; LA large   CAD (coronary artery disease) of artery bypass graft    Cardiac arrest (HCC) 05/31/2021   Carpal tunnel syndrome    Cerebral aneurysm dx'd 05/03/2005   Complication of anesthesia     "I' was told that I'm a very high risk to be put to sleep; I may not come out of it" (01/16/2016)   COPD (chronic obstructive pulmonary disease) (HCC)    Coronary artery disease    a. s/p CABG;  b. Myoview (7/13):  apical anterior ischemia; c. LHC (08/2012): dLM 60%, mid LAD 90%, pCFX 40%, OM1 occluded, RCA 80-90%, distal RCA 60-70%, SVG-Dx patent, SVG-OM1 patent, LIMA-LAD patent, SVG-PDA occluded, EF 55%. => RCA dsz too long and would req multiple stents; pt refused CABG => med Rx.   Depression    DJD (degenerative joint disease)    Dyslipidemia    Dysrhythmia    Hx of echocardiogram    a. Echocardiogram (05/2013): Mild LVH, EF 54.8%, moderate LAE, mild aortic root dilatation, trace pulmonic regurgitation   Hyperlipidemia    Hypertension    Ischemic cardiomyopathy  Obesity    Peripheral neuropathy    Permanent atrial fibrillation (HCC)    Presence of permanent cardiac pacemaker    Type II diabetes mellitus (HCC)    "haven't taken RX for 7-8 years" (01/16/2016)   Ventricular fibrillation (HCC) 05/31/2021      Dispostion: Disposition decision including need for hospitalization was considered, and patient admitted to the hospital.    Final Clinical Impression(s) / ED Diagnoses Final diagnoses:  Subarachnoid hematoma with loss of consciousness, initial encounter [facility])  Cardiac arrest [facility])     This chart was dictated using voice recognition software.  Despite best efforts to proofread,  errors can occur which can change the documentation meaning.    Lonell Grandchild, MD 06/10/23 [id number]

## 2023-06-10 NOTE — ED Triage Notes (Signed)
Pt came to ED BIB [facility] due to being unresponsive. Caretaker states pt usually wakes up at 3pm but at [id number] complained of headache. LSN 3am. Pt is on elliquis and has a pacemaker. Pupils are pinpoint.
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
## 2023-06-10 NOTE — ED Notes (Signed)
Pulse check. No pulses at this time. Resumed CPR 
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
## 2023-06-10 NOTE — Procedures (Signed)
Central Venous Catheter Insertion Procedure Note  JAYGER SCHMOCK  [id number]  [birth year]  Date:06/10/23  Time:5:36 PM   Provider Performing:Laquana Villari Chelsea Aus   Procedure: Insertion of Non-tunneled Central Venous Catheter(36556) with US guidance ([address])   Indication(s) Medication administration  Consent Unable to obtain consent due to emergent nature of procedure.  Anesthesia Topical only with 1% lidocaine   Timeout Verified patient identification, verified procedure, site/side was marked, verified correct patient position, special equipment/implants available, medications/allergies/relevant history reviewed, required imaging and test results available.  Sterile Technique Maximal sterile technique including full sterile barrier drape, hand hygiene, sterile gown, sterile gloves, mask, hair covering, sterile ultrasound probe cover (if used).  Procedure Description Area of catheter insertion was cleaned with chlorhexidine and draped in sterile fashion.  With real-time ultrasound guidance a central venous catheter was placed into the left internal jugular vein. Nonpulsatile blood flow and easy flushing noted in all ports.  The catheter was sutured in place and sterile dressing applied.  Complications/Tolerance None; patient tolerated the procedure well. Chest X-ray is ordered to verify placement for internal jugular or subclavian cannulation.   Chest x-ray is not ordered for femoral cannulation.  EBL Minimal  Specimen(s) None  Janeece Riggers, AGACNP-BC

## 2023-06-10 NOTE — Procedures (Signed)
Arterial Catheter Insertion Procedure Note  Ross [name]  [id number]  1956-02-10  Date:06/10/23  Time:5:37 PM    Provider Performing: Janeece Riggers    Procedure: Insertion of Arterial Line (69629) with US guidance ([id number])   Indication(s) Blood pressure monitoring and/or need for frequent ABGs  Consent Unable to obtain consent due to emergent nature of procedure.  Anesthesia None   Time Out Verified patient identification, verified procedure, site/side was marked, verified correct patient position, special equipment/implants available, medications/allergies/relevant history reviewed, required imaging and test results available.   Sterile Technique Maximal sterile technique including full sterile barrier drape, hand hygiene, sterile gown, sterile gloves, mask, hair covering, sterile ultrasound probe cover (if used).   Procedure Description Area of catheter insertion was cleaned with chlorhexidine and draped in sterile fashion. With real-time ultrasound guidance an arterial catheter was placed into the right radial artery.  Appropriate arterial tracings confirmed on monitor.     Complications/Tolerance None; patient tolerated the procedure well.   EBL Minimal   Specimen(s) None

## 2023-06-10 NOTE — ED Notes (Addendum)
Pulse check. No pulses at this time. Possible VT attending asked to charge at 120j and shock the pt. CPR resumed post shock.

## 2023-06-10 NOTE — ED Notes (Signed)
Pulse check. + pulses at this time.  

## 2023-06-10 NOTE — ED Notes (Signed)
Pulse check. + pulses at this time.

## 2023-06-10 NOTE — ED Notes (Addendum)
Pulse check. No pulses at this time. Resumed CPR 
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
## 2023-06-10 NOTE — ED Notes (Signed)
Zoll charged to 200j per attending ordered. CPR stocked. Rhythm check again, no pulses, possible VT, pt shocked at 200j. Cpr resumed post shock.

## 2023-06-10 NOTE — Progress Notes (Signed)
Attempted 2d echo pt getting lines per np, do at later time.

## 2023-06-10 NOTE — ED Notes (Signed)
Pulse check. No pulses at this time. Resumed CPR

## 2023-06-10 NOTE — ED Notes (Signed)
Pulse check at this. No pulses present. Resumed CPR

## 2023-06-11 ENCOUNTER — Inpatient Hospital Stay (HOSPITAL_COMMUNITY): Payer: Medicare Other

## 2023-06-11 DIAGNOSIS — I469 Cardiac arrest, cause unspecified: Secondary | ICD-10-CM

## 2023-06-11 DIAGNOSIS — G9340 Encephalopathy, unspecified: Secondary | ICD-10-CM | POA: Diagnosis not present

## 2023-06-11 LAB — POCT I-STAT 7, (LYTES, BLD GAS, ICA,H+H)
Acid-base deficit: 8 mmol/L — ABNORMAL HIGH (ref 0.0–2.0)
Bicarbonate: 17.4 mmol/L — ABNORMAL LOW (ref 20.0–28.0)
Calcium, Ion: 1.37 mmol/L (ref 1.15–1.40)
HCT: 47 % (ref 39.0–52.0)
Hemoglobin: 16 g/dL (ref 13.0–17.0)
O2 Saturation: 100 %
Patient temperature: 99.3
Potassium: 4.2 mmol/L (ref 3.5–5.1)
Sodium: 138 mmol/L (ref 135–145)
TCO2: 18 mmol/L — ABNORMAL LOW (ref 22–32)
pCO2 arterial: 34.2 mmHg (ref 32–48)
pH, Arterial: 7.317 — ABNORMAL LOW (ref 7.35–7.45)
pO2, Arterial: 217 mmHg — ABNORMAL HIGH (ref 83–108)

## 2023-06-11 LAB — CBC
HCT: 47.8 % (ref 39.0–52.0)
Hemoglobin: 15.3 g/dL (ref 13.0–17.0)
MCH: 30.5 pg (ref 26.0–34.0)
MCHC: 32 g/dL (ref 30.0–36.0)
MCV: 95.4 fL (ref 80.0–100.0)
Platelets: 221 10*3/uL (ref 150–400)
RBC: 5.01 MIL/uL (ref 4.22–5.81)
RDW: 13 % (ref 11.5–15.5)
WBC: 21.7 10*3/uL — ABNORMAL HIGH (ref 4.0–10.5)
nRBC: 0 % (ref 0.0–0.2)

## 2023-06-11 LAB — GLUCOSE, CAPILLARY
Glucose-Capillary: 219 mg/dL — ABNORMAL HIGH (ref 70–99)
Glucose-Capillary: 128 mg/dL — ABNORMAL HIGH (ref 70–99)
Glucose-Capillary: 115 mg/dL — ABNORMAL HIGH (ref 70–99)
Glucose-Capillary: 120 mg/dL — ABNORMAL HIGH (ref 70–99)
Glucose-Capillary: 114 mg/dL — ABNORMAL HIGH (ref 70–99)
Glucose-Capillary: 126 mg/dL — ABNORMAL HIGH (ref 70–99)
Glucose-Capillary: 98 mg/dL (ref 70–99)

## 2023-06-11 LAB — COMPREHENSIVE METABOLIC PANEL
ALT: 130 U/L — ABNORMAL HIGH (ref 0–44)
AST: 103 U/L — ABNORMAL HIGH (ref 15–41)
Albumin: 3 g/dL — ABNORMAL LOW (ref 3.5–5.0)
Alkaline Phosphatase: 86 U/L (ref 38–126)
Anion gap: 14 (ref 5–15)
BUN: 27 mg/dL — ABNORMAL HIGH (ref 8–23)
CO2: 16 mmol/L — ABNORMAL LOW (ref 22–32)
Calcium: 9.9 mg/dL (ref 8.9–10.3)
Chloride: 106 mmol/L (ref 98–111)
Creatinine, Ser: 2.45 mg/dL — ABNORMAL HIGH (ref 0.61–1.24)
GFR, Estimated: 28 mL/min — ABNORMAL LOW (ref >60)
Glucose, Bld: 220 mg/dL — ABNORMAL HIGH (ref 70–99)
Potassium: 4.2 mmol/L (ref 3.5–5.1)
Sodium: 136 mmol/L (ref 135–145)
Total Bilirubin: 0.9 mg/dL (ref 0.3–1.2)
Total Protein: 5.6 g/dL — ABNORMAL LOW (ref 6.5–8.1)

## 2023-06-11 LAB — CULTURE, BLOOD (ROUTINE X 2)

## 2023-06-11 LAB — TROPONIN I (HIGH SENSITIVITY): Troponin I (High Sensitivity): 2442 ng/L (ref <18)

## 2023-06-11 LAB — ECHOCARDIOGRAM COMPLETE
AV Peak grad: 2.5 mmHg
Ao pk vel: 0.79 m/s
Area-P 1/2: 3.34 cm2
Height: 73 in
Weight: 3636.71 oz

## 2023-06-11 LAB — CULTURE, RESPIRATORY W GRAM STAIN

## 2023-06-11 LAB — TRIGLYCERIDES: Triglycerides: 90 mg/dL (ref <150)

## 2023-06-11 LAB — PROCALCITONIN: Procalcitonin: 34.04 ng/mL

## 2023-06-11 MED ORDER — ACETAMINOPHEN 650 MG RE SUPP: 650.0000 mg | Freq: Four times a day (QID) | RECTAL | Status: DC | PRN

## 2023-06-11 MED ORDER — MIDAZOLAM HCL 2 MG/2ML IJ SOLN: 2.0000 mg | INTRAMUSCULAR | Status: DC | PRN

## 2023-06-11 MED ORDER — CHLORHEXIDINE GLUCONATE CLOTH 2 % EX PADS: 6.0000 | MEDICATED_PAD | Freq: Every day | CUTANEOUS | Status: DC

## 2023-06-11 MED ORDER — MORPHINE 100MG IN NS 100ML (1MG/ML) PREMIX INFUSION
0.0000 mg/h | INTRAVENOUS | Status: DC
  Administered 2023-06-11: 5 mg/h via INTRAVENOUS
  Filled 2023-06-11: qty 100

## 2023-06-11 MED ORDER — SODIUM CHLORIDE 0.9 % IV SOLN
2.0000 g | INTRAVENOUS | Status: DC
  Administered 2023-06-11: 2 g via INTRAVENOUS
  Filled 2023-06-11: qty 20

## 2023-06-11 MED ORDER — HALOPERIDOL LACTATE 5 MG/ML IJ SOLN: 2.5000 mg | INTRAMUSCULAR | Status: DC | PRN

## 2023-06-11 MED ORDER — GLYCOPYRROLATE 0.2 MG/ML IJ SOLN: 0.2000 mg | INTRAMUSCULAR | Status: DC | PRN

## 2023-06-11 MED ORDER — ACETAMINOPHEN 160 MG/5ML PO SOLN
650.0000 mg | ORAL | Status: DC | PRN
  Administered 2023-06-11 (×2): 650 mg
  Filled 2023-06-11 (×2): qty 20.3

## 2023-06-11 MED ORDER — INSULIN ASPART 100 UNIT/ML IJ SOLN: 0.0000 [IU] | INTRAMUSCULAR | Status: DC

## 2023-06-11 MED ORDER — ACETAMINOPHEN 325 MG PO TABS: 650.0000 mg | ORAL_TABLET | Freq: Four times a day (QID) | ORAL | Status: DC | PRN

## 2023-06-11 MED ORDER — PERFLUTREN LIPID MICROSPHERE
1.0000 mL | INTRAVENOUS | Status: AC | PRN
  Administered 2023-06-11: 4 mL via INTRAVENOUS

## 2023-06-11 MED ORDER — SODIUM CHLORIDE 0.9 % IV SOLN: INTRAVENOUS | Status: DC

## 2023-06-11 MED ORDER — POLYVINYL ALCOHOL 1.4 % OP SOLN: 1.0000 [drp] | Freq: Four times a day (QID) | OPHTHALMIC | Status: DC | PRN

## 2023-06-11 MED ORDER — MORPHINE BOLUS VIA INFUSION: 5.0000 mg | INTRAVENOUS | Status: DC | PRN

## 2023-06-11 MED ORDER — GLYCOPYRROLATE 1 MG PO TABS: 1.0000 mg | ORAL_TABLET | ORAL | Status: DC | PRN

## 2023-06-11 MED FILL — Medication: Qty: 2 | Status: AC

## 2023-06-12 LAB — CULTURE, BLOOD (ROUTINE X 2)

## 2023-06-12 NOTE — Progress Notes (Signed)
Wasted 75cc of morphine with Rudean Hitt RN

## 2023-06-13 LAB — CULTURE, BLOOD (ROUTINE X 2): Special Requests: ADEQUATE

## 2023-06-14 ENCOUNTER — Other Ambulatory Visit (HOSPITAL_COMMUNITY): Payer: Self-pay

## 2023-06-14 LAB — CULTURE, RESPIRATORY W GRAM STAIN

## 2023-06-14 LAB — CULTURE, BLOOD (ROUTINE X 2)
Culture: NO GROWTH
Culture: NO GROWTH

## 2023-06-15 LAB — CULTURE, BLOOD (ROUTINE X 2)

## 2023-06-16 DIAGNOSIS — R570 Cardiogenic shock: Secondary | ICD-10-CM | POA: Diagnosis not present

## 2023-06-16 DIAGNOSIS — N179 Acute kidney failure, unspecified: Secondary | ICD-10-CM | POA: Diagnosis not present

## 2023-06-16 DIAGNOSIS — K7200 Acute and subacute hepatic failure without coma: Secondary | ICD-10-CM | POA: Diagnosis not present

## 2023-06-16 DIAGNOSIS — E119 Type 2 diabetes mellitus without complications: Secondary | ICD-10-CM

## 2023-06-16 DIAGNOSIS — J9600 Acute respiratory failure, unspecified whether with hypoxia or hypercapnia: Secondary | ICD-10-CM | POA: Diagnosis present

## 2023-06-21 NOTE — Progress Notes (Signed)
  NEUROSURGERY PROGRESS NOTE   No issues overnight. Remains unresponsive  EXAM:  BP 139/75   Pulse 61   Temp (!) 101.5 F (38.6 C)   Resp (!) 23   Ht 6\' 1"  (1.854 m)   Wt 103.1 kg   SpO2 99%   BMI 29.99 kg/m   No eye opening to pain Pupils 3mm, non-reactive Weak cough/gag No motor responses   IMPRESSION:  67 y.o. male SAH d#2 with minimal remaining brainstem function and requiring ACLS code x3 yesterday. Remains on vasopressor and ventilatory support.  PLAN: - Cont supportive care for now  I have again reviewed the situation with the patient's daughter at bedside. I told her there is essentially no chance of meaningful neurologic recovery. I recommended changing code status to DNR followed by one-way extubation. All questions this am were answered.   Lisbeth Renshaw, MD [facility]

## 2023-06-21 NOTE — Progress Notes (Signed)
Family @ bedside, patient was extubated after the morphine gtt was started. The patient TOD is 2236. Heart ausculted for each by Cora Collum RN and Rudean Hitt RN, no heart sounds heard, no breath sounds. Family comforted and all questions answered.

## 2023-06-21 NOTE — Progress Notes (Signed)
Extubation Procedure Note  Patient Details:   Name: Ross [name] DOB: 1956/09/24 MRN: [id number]   Airway Documentation:  Airway 7.5 mm (Active)  Secured at (cm) 24 cm  1953  Measured From Lips  1953  Secured Location Center  1953  Secured By [employer]  1953  Tube Holder Repositioned Yes  1953  Prone position No  1953  Cuff Pressure (cm H2O) Clear OR 27-39 CmH2O  1953  Site Condition Cool  1953   Vent end date: (not recorded) Vent end time: (not recorded)   Evaluation  O2 sats: stable throughout Complications: No apparent complications Patient did not tolerate procedure well. Bilateral Breath Sounds: Diminished   Yes Extubated pt. Per family order to RA   Toan Mort A Fonda Rochon , 10:32 PM

## 2023-06-21 NOTE — Death Summary Note (Signed)
DEATH SUMMARY   Patient Details  Name: Ross [name] MRN: [id number] DOB: 19-May-1956  Admission/Discharge Information   Admit Date:  06/28/2023  Date of Death: Date of Death: Jun 29, 2023  Time of Death: Time of Death: 2235/07/18  Length of Stay: 2  Referring Physician: Joycelyn Rua, MD   Reason(s) for Hospitalization  Encephalopathy  Diagnoses  Preliminary cause of death:  Acute subarachnoid hemorrhage  Secondary Diagnoses (including complications and co-morbidities):  Principal Problem:   Subarachnoid hematoma (HCC) Active Problems:   Coronary artery disease involving coronary bypass graft of native heart with angina pectoris [facility])   Atrial fibrillation (HCC)   Essential hypertension, benign   Chronic combined systolic and diastolic heart failure (HCC)   COPD (chronic obstructive pulmonary disease) (HCC)   Cardiac arrest (HCC)   Ischemic cardiomyopathy   Anoxic brain injury (HCC)   OSA (obstructive sleep apnea)   Acute encephalopathy   Diabetes (HCC)   Acute respiratory failure (HCC)   Cardiogenic shock (HCC)   Acute renal failure (ARF) (HCC)   Shock liver   Brief Hospital Course (including significant findings, care, treatment, and services provided and events leading to death)  Ross [name] is a 67 y.o. year old male with CAD/CABG, history of VF with an ICD, A-fib on Eliquis, hypertension, COPD, diabetes and a known ACOM aneurysm. He was admitted 28-Jun-2023 with encephalopathy and respiratory failure due to failed airway protection in the setting of large volume intraventricular hemorrhage and hydrocephalus consistent with an ACOM rupture.   He experienced PEA arrest in the CT scanner, following with ROSC after about 30 minutes. He has continued to be unstable requiring pressors, experienced a second PEA arrest in the ED prior to transfer to the ICU. Neurosurgery was consulted to consider an emergent EVD. His Eliquis was reversed with Kcentra He required epinephrine and  norepinephrine infusions immediately postresuscitation.  These were able to be weaned off in about 24 hours.  He remained mechanically ventilated, was not a candidate for spontaneous breathing trial or extubation due to his profound encephalopathy associated with the subdural hematoma and likely anoxic injury from his code.  Labs revealed acute renal failure, likely due to ATN from hypoperfusion, also showed shock liver.  Given his profound brain injury, the degree of his SDH, his encephalopathy and poor prognosis for neurological recovery, discussions were undertaken with the patient's family regarding goals for his care and possible withdrawal of care.  Decision was made to transition to comfort on 06-29-2023 and the patient was extubated.  He expired the same day at 18-Jul-2235.    Pertinent Labs and Studies  Significant Diagnostic Studies ECHOCARDIOGRAM COMPLETE  Result Date: 2023-06-29    ECHOCARDIOGRAM REPORT   Patient Name:   Ross [name] Date of Exam: 06/29/23 Medical Rec #:  [id number]      Height:       73.0 in Accession #:    [id number]     Weight:       227.3 lb Date of Birth:  07-28-1956      BSA:          2.272 m Patient Age:    66 years       BP:           121/77 mmHg Patient Gender: M              HR:           60 bpm. Exam Location:  Inpatient Procedure: 2D Echo, Cardiac Doppler, Color Doppler  and Intracardiac            Opacification Agent Indications:     Cardiac arrest I46.9  History:         Patient has prior history of Echocardiogram examinations, most                  recent 06/01/2021. Cardiomyopathy, CAD, Pacemaker and Prior                  CABG, COPD, Arrythmias:Atrial Fibrillation,                  Signs/Symptoms:Chest Pain; Risk Factors:Hypertension, Sleep                  Apnea and Dyslipidemia.  Sonographer:     Lucendia Herrlich Referring Phys:  [id number] PAULA B SIMPSON Diagnosing Phys: Weston Brass MD  Sonographer Comments: No parasternal window, suboptimal apical window,  suboptimal subcostal window and Technically difficult study due to poor echo windows. IMPRESSIONS  1. Left ventricular ejection fraction, by estimation, is 40 to 45%. The left ventricle has mildly decreased function. The left ventricle demonstrates regional wall motion abnormalities (see scoring diagram/findings for description). Left ventricular diastolic parameters are indeterminate.  2. Right ventricular systolic function was not well visualized. The right ventricular size is not well visualized. Tricuspid regurgitation signal is inadequate for assessing PA pressure.  3. Left atrial size was mildly dilated.  4. The mitral valve is grossly normal. No evidence of mitral valve regurgitation.  5. The aortic valve was not well visualized. Aortic valve regurgitation is not visualized. No aortic stenosis is present.  6. The inferior vena cava is normal in size with <50% respiratory variability, suggesting right atrial pressure of 8 mmHg. FINDINGS  Left Ventricle: Left ventricular ejection fraction, by estimation, is 40 to 45%. The left ventricle has mildly decreased function. The left ventricle demonstrates regional wall motion abnormalities. Definity contrast agent was given IV to delineate the left ventricular endocardial borders. The left ventricular internal cavity size was normal in size. Suboptimal image quality limits for assessment of left ventricular hypertrophy. Left ventricular diastolic parameters are indeterminate.  LV Wall Scoring: The apical septal segment, apical anterior segment, and apical inferior segment are akinetic. The apical lateral segment is hypokinetic. Right Ventricle: The right ventricular size is not well visualized. Right vetricular wall thickness was not assessed. Right ventricular systolic function was not well visualized. Tricuspid regurgitation signal is inadequate for assessing PA pressure. Left Atrium: Left atrial size was mildly dilated. Right Atrium: Right atrial size was not  assessed. Pericardium: There is no evidence of pericardial effusion. Mitral Valve: The mitral valve is grossly normal. No evidence of mitral valve regurgitation. Tricuspid Valve: The tricuspid valve is not well visualized. Tricuspid valve regurgitation is not demonstrated. Aortic Valve: The aortic valve was not well visualized. Aortic valve regurgitation is not visualized. No aortic stenosis is present. Aortic valve peak gradient measures 2.5 mmHg. Pulmonic Valve: The pulmonic valve was not assessed. Pulmonic valve regurgitation is not visualized. Aorta: The aortic root was not well visualized and the ascending aorta was not well visualized. Venous: The inferior vena cava is normal in size with less than 50% respiratory variability, suggesting right atrial pressure of 8 mmHg. IAS/Shunts: The interatrial septum was not assessed.   Diastology LV e' medial:   5.00 cm/s LV E/e' medial: 13.4  IVC IVC diam: 1.80 cm LEFT ATRIUM             Index LA  Vol (A2C):   77.1 ml 33.94 ml/m LA Vol (A4C):   46.6 ml 20.51 ml/m LA Biplane Vol: 60.7 ml 26.72 ml/m  AORTIC VALVE AV Vmax:      79.10 cm/s AV Peak Grad: 2.5 mmHg LVOT Vmax:    70.13 cm/s LVOT Vmean:   42.867 cm/s LVOT VTI:     0.079 m MITRAL VALVE MV Area (PHT): 3.34 cm    SHUNTS MV Decel Time: 227 msec    Systemic VTI: 0.08 m MV E velocity: 67.10 cm/s Weston Brass MD Electronically signed by Weston Brass MD Signature Date/Time: 06/01/2023/1:04:16 PM    Final (Updated)    DG Abd 1 View  Result Date: 06/03/2023 CLINICAL DATA:  Nasogastric tube placement. EXAM: ABDOMEN - 1 VIEW COMPARISON:  Radiograph earlier today FINDINGS: Tip and side port of the enteric tube below the diaphragm in the stomach. Endotracheal tube is partially included in the field of view. Nonobstructed upper abdominal bowel gas pattern. IMPRESSION: Tip and side port of the enteric tube below the diaphragm in the stomach. Electronically Signed   By: Narda Rutherford M.D.   On: 06/04/2023 21:29    DG CHEST PORT 1 VIEW  Result Date: 06/08/2023 CLINICAL DATA:  Central line placement EXAM: PORTABLE CHEST 1 VIEW COMPARISON:  05/26/2023 FINDINGS: The patient is rotated to the right on today's radiograph, reducing diagnostic sensitivity and specificity. Endotracheal tube satisfactorily positioned with tip 6.3 cm above the carina. Pacer and ICD devices are again observed. There is a new left IJ central venous catheter. This is believed to extend over into the confluence of the brachiocephalic vein into the SVC. Because of the rightward rotation, further localization is difficult. No pneumothorax. Moderate enlargement of the cardiopericardial silhouette is observed with indistinct pulmonary vasculature compatible with pulmonary venous hypertension. The patchy interstitial and airspace opacity in the right upper lobe shown on the earlier exam appears mildly improved. Thoracic spondylosis. IMPRESSION: 1. New left IJ central venous catheter, tip likely at the confluence of the brachiocephalic vein and SVC. No pneumothorax. 2. Moderate enlargement of the cardiopericardial silhouette with indistinct pulmonary vasculature compatible with pulmonary venous hypertension. 3. Mild improvement in the patchy interstitial and airspace opacity in the right upper lobe. 4. The patient is rotated to the right on today's radiograph, reducing diagnostic sensitivity and specificity. Electronically Signed   By: Gaylyn Rong M.D.   On: 05/29/2023 18:57   DG Abd Portable 1 View  Result Date: 06/17/2023 CLINICAL DATA:  Orogastric tube placement. EXAM: PORTABLE ABDOMEN - 1 VIEW COMPARISON:  Chest radiograph and head CT earlier today FINDINGS: Only a portion of the mid to upper abdomen was imaged, with exclusion of the diaphragms, and assessment is also limited by underpenetration. No enteric tube is visualized on this radiograph or on the contemporaneous chest radiograph. The earlier head CT showed the orogastric tube to be  coiled in the pharynx. IMPRESSION: No enteric tube visualized on this abdominal radiograph or on the contemporaneous chest radiograph. The orogastric tube was coiled in the pharynx on the earlier head CT. Electronically Signed   By: Sebastian Ache M.D.   On: 05/22/2023 16:07   DG Chest Portable 1 View  Result Date: 06/09/2023 CLINICAL DATA:  Status post intubation. EXAM: PORTABLE CHEST 1 VIEW COMPARISON:  Chest radiograph 02/18/2023 FINDINGS: An endotracheal tube terminates 6 cm above the carina. Sequelae of CABG are again identified. The cardiac silhouette remains enlarged, and bilateral subclavian approach pacemaker/defibrillator remain in place. There is central pulmonary vascular congestion.  New patchy airspace opacities are present in the right greater than left perihilar regions and upper lobes. A trace right pleural effusion is not excluded. No pneumothorax is identified. No acute osseous abnormality is seen. IMPRESSION: 1. Endotracheal tube as above. 2. Cardiomegaly and central pulmonary vascular congestion. 3. New right greater than left perihilar and upper lobe opacities which could reflect edema, aspiration, or pneumonia. Electronically Signed   By: Sebastian Ache M.D.   On: 06/03/2023 16:04   CT HEAD WO CONTRAST ( )  Result Date: 06/19/2023 CLINICAL DATA:  Unresponsive. EXAM: CT HEAD WITHOUT CONTRAST TECHNIQUE: Contiguous axial images were obtained from the base of the skull through the vertex without intravenous contrast. RADIATION DOSE REDUCTION: This exam was performed according to the departmental dose-optimization program which includes automated exposure control, adjustment of the mA and/or kV according to patient size and/or use of iterative reconstruction technique. COMPARISON:  CT head 02/19/2023 FINDINGS: Brain: There is extensive intraventricular hemorrhage throughout the lateral, third, and fourth ventricles with expansion of the ventricles and extension through the foramina of Luschka.  Blood extends superiorly along the undersurface of the cerebellum. There is an intraparenchymal component in the right gyrus rectus (7-22). The ventricles are expanded compared to the prior study. There is subarachnoid hemorrhage overlying the frontal lobes, in the MCA cisterns, and right sylvian fissure. Findings likely reflect a ruptured anterior communicating artery aneurysm. There is hypodensity in the periventricular white matter likely reflecting transependymal flow of CSF. There is no acute large vessel territorial infarct. Vascular: The known anterior communicating artery aneurysm is not well seen. Skull: Normal. Negative for fracture or focal lesion. Sinuses/Orbits: The paranasal sinuses are clear. The mastoid air cells are clear. The globes and orbits are unremarkable. Other: An endotracheal tube is partially imaged. The enteric tube is coiled in the mouth. IMPRESSION: Extensive multicompartmental hemorrhage as described above with large volume intraventricular blood, hydrocephalus, and likely transependymal flow of CSF. This likely reflects a rupture of the known anterior communicating artery aneurysm. These results were called by telephone at the time of interpretation on 06/08/2023 at 2:49 pm to provider Alvino Blood , who verbally acknowledged these results. Electronically Signed   By: Lesia Hausen M.D.   On: 06/15/2023 14:52    Microbiology Recent Results (from the past 240 hour(s))  Blood culture (routine x 2)     Status: None   Collection Time: 05/30/2023  2:09 PM   Specimen: BLOOD  Result Value Ref Range Status   Specimen Description BLOOD BLOOD RIGHT FOREARM  Final   Special Requests   Final    BOTTLES DRAWN AEROBIC AND ANAEROBIC Blood Culture adequate volume   Culture   Final    NO GROWTH 5 DAYS Performed at [facility], [address], [place], [place] [address]    Report Status 06/15/2023 FINAL  Final  Blood culture (routine x 2)     Status: None   Collection Time:  06/16/2023  2:16 PM   Specimen: BLOOD LEFT HAND  Result Value Ref Range Status   Specimen Description BLOOD LEFT HAND  Final   Special Requests   Final    BOTTLES DRAWN AEROBIC AND ANAEROBIC Blood Culture results may not be optimal due to an inadequate volume of blood received in culture bottles   Culture   Final    NO GROWTH 5 DAYS Performed at [facility], [address], [place], [place] [address]    Report Status 06/15/2023 FINAL  Final  Culture,  Respiratory w Gram Stain     Status: None   Collection Time: 06/14/2023  4:48 PM   Specimen: Tracheal Aspirate; Respiratory  Result Value Ref Range Status   Specimen Description TRACHEAL ASPIRATE  Final   Special Requests NONE  Final   Gram Stain   Final    FEW WBC PRESENT,BOTH PMN AND MONONUCLEAR FEW GRAM POSITIVE COCCI RARE GRAM NEGATIVE RODS    Culture   Final    FEW PSEUDOMONAS AERUGINOSA NO STAPHYLOCOCCUS AUREUS ISOLATED Performed at [facility], [address], [place], [place] [address]    Report Status 06/14/2023 FINAL  Final   Organism ID, Bacteria PSEUDOMONAS AERUGINOSA  Final      Susceptibility   Pseudomonas aeruginosa - MIC*    CEFTAZIDIME 4 SENSITIVE Sensitive     CIPROFLOXACIN <=0.25 SENSITIVE Sensitive     GENTAMICIN 2 SENSITIVE Sensitive     IMIPENEM 1 SENSITIVE Sensitive     PIP/TAZO 8 SENSITIVE Sensitive     CEFEPIME 2 SENSITIVE Sensitive     * FEW PSEUDOMONAS AERUGINOSA  MRSA Next Gen by PCR, Nasal     Status: None   Collection Time: 06/14/2023  5:31 PM   Specimen: Nasal Mucosa; Nasal Swab  Result Value Ref Range Status   MRSA by PCR Next Gen NOT DETECTED NOT DETECTED Final    Comment: (NOTE) The GeneXpert MRSA Assay (FDA approved for NASAL specimens only), is one component of a comprehensive MRSA colonization surveillance program. It is not intended to diagnose MRSA infection nor to guide or monitor treatment for MRSA infections. Test performance is not FDA approved in patients less than 60  years old. Performed at [facility], [address] [place], [place] [address]     Lab Basic Metabolic Panel: Recent Labs  Lab 06/15/2023 1412 06/08/2023 1420 06/03/2023 1421 06/03/2023 1528 05/31/2023 1731 2023/06/13 0354 2023/06/13 0407  NA 141 142 142 142 140 138 136  K 3.9 3.9 3.9 3.5 4.0 4.2 4.2  CL 110 111  --   --  110  --  106  CO2 21*  --   --   --  18*  --  16*  GLUCOSE 126* 124*  --   --  293*  --  220*  BUN 19 21  --   --  27*  --  27*  CREATININE 1.49* 1.50*  --   --  1.96*  --  2.45*  CALCIUM 9.6  --   --   --  9.2  --  9.9  MG  --   --   --   --  1.9  --   --    Liver Function Tests: Recent Labs  Lab 05/27/2023 1412 06/07/2023 1731 2023-06-13 0407  AST 27 128* 103*  ALT 33 132* 130*  ALKPHOS 85 90 86  BILITOT 0.8 1.2 0.9  PROT 6.0* 5.6* 5.6*  ALBUMIN 3.4* 3.0* 3.0*   No results for input(s): "LIPASE", "AMYLASE" in the last 168 hours. No results for input(s): "AMMONIA" in the last 168 hours. CBC: Recent Labs  Lab 06/06/2023 1412 06/09/2023 1420 06/10/2023 1421 06/18/2023 1528 05/26/2023 1731 Jun 13, 2023 0354 06/13/2023 0407  WBC 11.5*  --   --   --  22.1*  --  21.7*  NEUTROABS 7.7  --   --   --   --   --   --   HGB 14.7   < > 15.0 14.6 14.9 16.0 15.3  HCT 47.1   < >  44.0 43.0 48.0 47.0 47.8  MCV 97.9  --   --   --  98.0  --  95.4  PLT 232  --   --   --  237  --  221   < > = values in this interval not displayed.   Cardiac Enzymes: No results for input(s): "CKTOTAL", "CKMB", "CKMBINDEX", "TROPONINI" in the last 168 hours. Sepsis Labs: Recent Labs  Lab 06/05/2023 1412 06/08/2023 1416 06/18/2023 1731 2023-06-23 0407  PROCALCITON  --   --  0.53 34.04  WBC 11.5*  --  22.1* 21.7*  LATICACIDVEN  --  1.2 3.1*  --       Les Pou Sama Arauz 06/16/2023, 2:12 PM

## 2023-06-21 NOTE — Progress Notes (Signed)
All family at bedside at this time, the decision has been made to move forward with comfort measures. CCM made aware and MD will speak with family.

## 2023-06-21 NOTE — Progress Notes (Signed)
NAME:  Ross [name], MRN:  [id number], DOB:  1956/07/05, LOS: 1 ADMISSION DATE:  06/03/2023, CONSULTATION DATE:  06/05/2023 REFERRING MD:  Dr. Suezanne Jacquet, CHIEF COMPLAINT:  AMS   History of Present Illness:  67 year old male with PMH as below significant for known ACOM aneurysm, chronic systolic and diastolic HF (EF 40-98% as of 05/2021), recurrent LLLB/ vfib arrest s/p ICD, CAD s/p prior CABG, afib on Eliquis, HTN, COPD, DM, obesity, who presented with altered mental status.  After worsening headaches for several months, pt underwent cerebral angiogram by Dr. Patric Dykes 03/30/2023 for known anterior communicating artery aneurysm, with plans for eventual coiling.  Found unresponsive at home today.  GCS 3 on arrival to ER.  Intubated for airway protection.  CTH showed extensive multicompartmental hemorrhage with large volume IVH, hydrocephalus, and likely transependymal flow of CSF most likely representing known ACOM rupture.  Patient unfortunately suffered PEA cardiac arrest in CT scanner, with ROSC after ~6mins; was defib unclear for possible Vtach .  Patient has remained unstable with fluctuating desaturations and hypotensive requiring epinephrine gtt.  NSGY consulted with plans to place emergent EVD.  Eliquis reversal with [place] given.   PCCM to admit.  Had a second PEA arrest in ER prior to transfer to ICU.    Pertinent  Medical History   Past Medical History:  Diagnosis Date   Anxiety    Arthritis    "all over my body"   Atrial fibrillation (HCC)    rate control strategy; LA large   CAD (coronary artery disease) of artery bypass graft    Cardiac arrest (HCC) 05/31/2021   Carpal tunnel syndrome    Cerebral aneurysm dx'd 05/03/2005   Complication of anesthesia    "I' was told that I'm a very high risk to be put to sleep; I may not come out of it" (01/16/2016)   COPD (chronic obstructive pulmonary disease) (HCC)    Coronary artery disease    a. s/p CABG;  b. Myoview (7/13):  apical anterior  ischemia; c. LHC (08/2012): dLM 60%, mid LAD 90%, pCFX 40%, OM1 occluded, RCA 80-90%, distal RCA 60-70%, SVG-Dx patent, SVG-OM1 patent, LIMA-LAD patent, SVG-PDA occluded, EF 55%. => RCA dsz too long and would req multiple stents; pt refused CABG => med Rx.   Depression    DJD (degenerative joint disease)    Dyslipidemia    Dysrhythmia    Hx of echocardiogram    a. Echocardiogram (05/2013): Mild LVH, EF 54.8%, moderate LAE, mild aortic root dilatation, trace pulmonic regurgitation   Hyperlipidemia    Hypertension    Ischemic cardiomyopathy    Obesity    Peripheral neuropathy    Permanent atrial fibrillation (HCC)    Presence of permanent cardiac pacemaker    Type II diabetes mellitus (HCC)    "haven't taken RX for 7-8 years" (01/16/2016)   Ventricular fibrillation (HCC) 05/31/2021   Significant Hospital Events: Including procedures, antibiotic start and stop dates in addition to other pertinent events   6/20 admit- ruptured ACOM aneurysm, multiple cardiac arrest, shock  6/21 No cough, gag, corneal reflex and no breaths over vent this am, has likely progressed to brain death. Family updated   Interim History / Subjective:  Unresponsive on exam   Objective   Blood pressure 118/67, pulse 61, temperature (!) 101.5 F (38.6 C), resp. rate (!) 22, height 6\' 1"  (1.854 m), weight 103.1 kg, SpO2 97 %.    Vent Mode: PRVC FiO2 (%):  [40 %-100 %] 40 %  Set Rate:  [16 bmp-22 bmp] 22 bmp Vt Set:  [640 mL] 640 mL PEEP:  [5 cmH20-10 cmH20] 8 cmH20 Plateau Pressure:  [20 cmH20-23 cmH20] 20 cmH20   Intake/Output Summary (Last 24 hours) at 2023/06/25 2956 Last data filed at 06/25/23 0940 Gross per 24 hour  Intake 4141.99 ml  Output 775 ml  Net 3366.99 ml   Filed Weights   06/19/2023 2200  Weight: 103.1 kg    Examination: General: Acute on chronically ill appearing middle aged male lying in bed on mechanical ventilation, in NAD HEENT: ETT, MM pink/moist, PERRL,  Neuro: No cough, gag, corneal  reflex CV: s1s2 regular rate and rhythm, no murmur, rubs, or gallops,  PULM:  Tolerating vent, does not breath over vent GI: soft, bowel sounds active in all 4 quadrants, non-tender, non-distended Extremities: warm/dry, no edema  Skin: no rashes or lesions  Resolved Hospital Problem list    Assessment & Plan:   Ruptured ACOM aneurysm with hydrocephalus  Encephalopathy, metabolic and concerning for anoxic injury with multiple arrest Likely progressed to brain death am 06-25-2023 - Hunt Hess score 5 -NSGY recommending comfort measures  P: Primary management per neurology/NSGY Maintain neuro protective measures; goal for eurothermia, euglycemia, eunatermia, normoxia, and PCO2 goal of 35-40 Nutrition and bowel regiment  Aspirations precautions  Ongoing GOC Will proceed with brain death testing today    PEA cardiac arrest Shock, multifactorial related to above and likely cardiogenic component - bedside POCUS with severely reduced LV function Hx combined systolic/ diastolic HF w/ ICD CAD s/p CABG HTN HLD Afib on Eliquis -s/p kcentra for reversal of Eliquis P:  Continuous telemetry  Remains on epi and levo drips  Strict intake and output  Daily weight to assess volume status Daily assessment for need to diurese Closely monitor renal function and electrolytes  Vent support as below  ECHO pending   Acute hypoxic respiratory failure with bilateral opacities, likely pulmonary edema, can not rule out infectious/ aspiration event Hx COPD, no exacerbation P: Continue ventilator support with lung protective strategies  Wean PEEP and FiO2 for sats greater than 90%. Head of bed elevated 30 degrees. Plateau pressures less than 30 cm H20.  Follow intermittent chest x-ray and ABG.   SAT/SBT as tolerated, mentation preclude extubation  Ensure adequate pulmonary hygiene  Follow cultures  VAP bundle in place  PAD protocol Start empiric Ceftriaxone  Continue BDs  AKI  -in the setting o  shock state creatinine increased with 2.46 with GFR 28 P: Follow renal function  Monitor urine output Trend Bmet Avoid nephrotoxins Ensure adequate renal perfusion   Elevated LFTs -Likely secondary to shock liver  P: Trend LFTs  Avoid hepatotoxins   DM P: Increased SSI to moderate scale   CBG goal 140-180  GOC Ongoing GOC with pt's daughter and fiance at bedside.  Remains full code.  Given poor neurologic exam, with likely progression to brain death will continue to support family for ongoing GOC  Best Practice (right click and "Reselect all SmartList Selections" daily)   Diet/type: NPO DVT prophylaxis: SCD GI prophylaxis: PPI Lines: N/A Foley:  Yes, and it is still needed Code Status:  full code Last date of multidisciplinary goals of care discussion [6/20]  Critical care time:    Performed by: Bralin Garry D. Harris  Total critical care time: 45 minutes  Critical care time was exclusive of separately billable procedures and treating other patients.  Critical care was necessary to treat or prevent imminent or life-threatening deterioration.  Critical care was time spent personally by me on the following activities: development of treatment plan with patient and/or surrogate as well as nursing, discussions with consultants, evaluation of patient's response to treatment, examination of patient, obtaining history from patient or surrogate, ordering and performing treatments and interventions, ordering and review of laboratory studies, ordering and review of radiographic studies, pulse oximetry and re-evaluation of patient's condition.  Najwa Spillane D. Harris, NP-C [facility] Personal contact information can be found on Amion  If no contact or response made please call [phone] 06/03/2023, 10:13 AM

## 2023-06-21 NOTE — Progress Notes (Signed)
Nutrition Brief Note  Chart reviewed. Pt admitted with extensive multi-compartmental hemorrhage with large volume IVH. Pt suffered 2 PEA cardiac arrest.  Per MD no cough, gag, corneal reflex and has likely progressed to brain death.  No nutrition interventions planned at this time.  Please re-consult as needed.   Cammy Copa., RD, LDN, CNSC See AMiON for contact information

## 2023-06-21 NOTE — Progress Notes (Signed)
eLink Physician-Brief Progress Note Patient Name: Ross [name] DOB: May 19, 1956 MRN: [id number]   Date of Service    HPI/Events of Note  Patient with devastating ICH with no realistic hope of re-gaining meaningful function.  eICU Interventions  Okay for [employer] to approach family for possible organ donation.        Migdalia Dk , 7:31 PM

## 2023-06-21 NOTE — Progress Notes (Signed)
    1250  Spiritual Encounters  Type of Visit Initial  Care provided to: [facility] partners present during encounter Nurse  Referral source Nurse (RN/NT/LPN)  Reason for visit End-of-life  OnCall Visit No   Chaplain responded to a call for support to the family of the patient, Zayed, as they say goodbye to their loved one. I spent individual time with Bernardino's sister Burnett Harry who explored how different life was going to be without her last family member. Shelly remembered Maziah's love of fishing and his hard work with the Teacher, music. I encouraged her to grieve as her heart wanted to and allow herself time to heal. Other family members were also grieving the pending loss as they wait for the last family members to arrive.We prayed as a group for Webb and for them and I prayed with [facility] individually. As I departed I advised them that if they wanted a chaplain to return to let a nurse know. Someone would respond.   Valerie Roys [facility]  [phone]

## 2023-06-21 NOTE — Progress Notes (Signed)
[facility] called for TOD. Spoke to coordinator [employer], answered all questions. Patient is eligible for tissue and eye Donation. Requested eye prep be done once patient family have left the bedside and gone home.

## 2023-06-21 NOTE — Progress Notes (Signed)
Echocardiogram 2D Echocardiogram has been performed.  Ross [name] , 9:47 AM

## 2023-06-21 NOTE — Progress Notes (Signed)
eLink Physician-Brief Progress Note Patient Name: Ross [name] DOB: 04/06/1956 MRN: [id number]   Date of Service    HPI/Events of Note  Terminal extubation confirmed by discussing with family in the room, the process was explained to family and all questions answered.  eICU Interventions  Extubation + Comfort measures ordered.        Migdalia Dk , 9:14 PM

## 2023-06-21 NOTE — Progress Notes (Signed)
CCM MD Ogan aware of patient TOD, Neurosurgery MD notified also.

## 2023-06-21 DEATH — deceased
